# Patient Record
Sex: Female | Born: 1945
Health system: Southern US, Community
[De-identification: ages and names within clinical notes are randomized; demographics above are authoritative.]

## PROBLEM LIST (undated history)

## (undated) DIAGNOSIS — I2510 Atherosclerotic heart disease of native coronary artery without angina pectoris: Secondary | ICD-10-CM

## (undated) DIAGNOSIS — F419 Anxiety disorder, unspecified: Secondary | ICD-10-CM

## (undated) DIAGNOSIS — C801 Malignant (primary) neoplasm, unspecified: Secondary | ICD-10-CM

## (undated) DIAGNOSIS — R161 Splenomegaly, not elsewhere classified: Secondary | ICD-10-CM

## (undated) DIAGNOSIS — L900 Lichen sclerosus et atrophicus: Secondary | ICD-10-CM

## (undated) DIAGNOSIS — R112 Nausea with vomiting, unspecified: Secondary | ICD-10-CM

## (undated) DIAGNOSIS — I495 Sick sinus syndrome: Secondary | ICD-10-CM

## (undated) DIAGNOSIS — K219 Gastro-esophageal reflux disease without esophagitis: Secondary | ICD-10-CM

## (undated) DIAGNOSIS — Z9889 Other specified postprocedural states: Secondary | ICD-10-CM

## (undated) DIAGNOSIS — R0982 Postnasal drip: Secondary | ICD-10-CM

## (undated) DIAGNOSIS — I10 Essential (primary) hypertension: Secondary | ICD-10-CM

## (undated) DIAGNOSIS — R011 Cardiac murmur, unspecified: Secondary | ICD-10-CM

## (undated) DIAGNOSIS — E785 Hyperlipidemia, unspecified: Secondary | ICD-10-CM

## (undated) DIAGNOSIS — Z950 Presence of cardiac pacemaker: Secondary | ICD-10-CM

## (undated) DIAGNOSIS — D649 Anemia, unspecified: Secondary | ICD-10-CM

## (undated) DIAGNOSIS — E039 Hypothyroidism, unspecified: Secondary | ICD-10-CM

## (undated) DIAGNOSIS — T7840XA Allergy, unspecified, initial encounter: Secondary | ICD-10-CM

## (undated) DIAGNOSIS — J45909 Unspecified asthma, uncomplicated: Secondary | ICD-10-CM

## (undated) DIAGNOSIS — L409 Psoriasis, unspecified: Secondary | ICD-10-CM

## (undated) DIAGNOSIS — J189 Pneumonia, unspecified organism: Secondary | ICD-10-CM

## (undated) DIAGNOSIS — Z8719 Personal history of other diseases of the digestive system: Secondary | ICD-10-CM

## (undated) HISTORY — DX: Atherosclerotic heart disease of native coronary artery without angina pectoris: I25.10

## (undated) HISTORY — DX: Gastro-esophageal reflux disease without esophagitis: K21.9

## (undated) HISTORY — PX: FINGER SURGERY: SHX640

## (undated) HISTORY — DX: Essential (primary) hypertension: I10

## (undated) HISTORY — PX: ANAL FISSURE REPAIR: SHX2312

## (undated) HISTORY — DX: Sick sinus syndrome: I49.5

## (undated) HISTORY — PX: CARDIAC CATHETERIZATION: SHX172

## (undated) HISTORY — PX: INSERT / REPLACE / REMOVE PACEMAKER: SUR710

## (undated) HISTORY — PX: NASAL SINUS SURGERY: SHX719

## (undated) HISTORY — DX: Allergy, unspecified, initial encounter: T78.40XA

## (undated) HISTORY — PX: TONSILLECTOMY: SUR1361

## (undated) HISTORY — DX: Cardiac murmur, unspecified: R01.1

## (undated) HISTORY — DX: Hypothyroidism, unspecified: E03.9

## (undated) HISTORY — PX: CORONARY ARTERY BYPASS GRAFT: SHX141

## (undated) HISTORY — PX: ABDOMINAL HYSTERECTOMY: SHX81

---

## 2001-02-20 ENCOUNTER — Other Ambulatory Visit: Admission: RE | Admit: 2001-02-20 | Discharge: 2001-02-20 | Payer: Self-pay | Admitting: Orthopaedic Surgery

## 2001-06-18 ENCOUNTER — Encounter (INDEPENDENT_AMBULATORY_CARE_PROVIDER_SITE_OTHER): Payer: Self-pay | Admitting: Internal Medicine

## 2001-06-18 ENCOUNTER — Ambulatory Visit (HOSPITAL_COMMUNITY): Admission: RE | Admit: 2001-06-18 | Discharge: 2001-06-18 | Payer: Self-pay | Admitting: Internal Medicine

## 2009-08-14 ENCOUNTER — Other Ambulatory Visit: Admission: RE | Admit: 2009-08-14 | Discharge: 2009-08-14 | Payer: Self-pay | Admitting: Orthopaedic Surgery

## 2009-09-18 ENCOUNTER — Other Ambulatory Visit: Admission: RE | Admit: 2009-09-18 | Discharge: 2009-09-18 | Payer: Self-pay | Admitting: Orthopaedic Surgery

## 2010-06-01 ENCOUNTER — Other Ambulatory Visit (HOSPITAL_COMMUNITY)
Admission: RE | Admit: 2010-06-01 | Discharge: 2010-06-01 | Disposition: A | Discharge status: Home / Self Care | Payer: Medicare Other | Source: Ambulatory Visit | Attending: Orthopaedic Surgery | Admitting: Orthopaedic Surgery

## 2010-06-01 ENCOUNTER — Other Ambulatory Visit: Payer: Self-pay | Admitting: Orthopaedic Surgery

## 2010-06-01 DIAGNOSIS — R29898 Other symptoms and signs involving the musculoskeletal system: Secondary | ICD-10-CM | POA: Insufficient documentation

## 2013-01-30 ENCOUNTER — Encounter (HOSPITAL_COMMUNITY): Payer: Self-pay | Admitting: Emergency Medicine

## 2013-01-30 ENCOUNTER — Emergency Department (HOSPITAL_COMMUNITY): Payer: Medicare Other

## 2013-01-30 ENCOUNTER — Emergency Department (HOSPITAL_COMMUNITY)
Admission: EM | Admit: 2013-01-30 | Discharge: 2013-01-30 | Disposition: A | Discharge status: Home / Self Care | Payer: Medicare Other | Attending: Emergency Medicine | Admitting: Emergency Medicine

## 2013-01-30 DIAGNOSIS — R0989 Other specified symptoms and signs involving the circulatory and respiratory systems: Secondary | ICD-10-CM | POA: Insufficient documentation

## 2013-01-30 DIAGNOSIS — J069 Acute upper respiratory infection, unspecified: Secondary | ICD-10-CM

## 2013-01-30 DIAGNOSIS — E079 Disorder of thyroid, unspecified: Secondary | ICD-10-CM | POA: Insufficient documentation

## 2013-01-30 DIAGNOSIS — I10 Essential (primary) hypertension: Secondary | ICD-10-CM | POA: Insufficient documentation

## 2013-01-30 DIAGNOSIS — R5381 Other malaise: Secondary | ICD-10-CM | POA: Insufficient documentation

## 2013-01-30 DIAGNOSIS — Z79899 Other long term (current) drug therapy: Secondary | ICD-10-CM | POA: Insufficient documentation

## 2013-01-30 MED ORDER — IPRATROPIUM BROMIDE 0.02 % IN SOLN
0.5000 mg | Freq: Once | RESPIRATORY_TRACT | Status: AC
  Administered 2013-01-30: 0.5 mg via RESPIRATORY_TRACT
  Filled 2013-01-30: qty 2.5

## 2013-01-30 MED ORDER — ALBUTEROL SULFATE (5 MG/ML) 0.5% IN NEBU
5.0000 mg | INHALATION_SOLUTION | Freq: Once | RESPIRATORY_TRACT | Status: AC
  Administered 2013-01-30: 5 mg via RESPIRATORY_TRACT
  Filled 2013-01-30: qty 1

## 2013-01-30 NOTE — ED Provider Notes (Signed)
CSN: [id number]     Arrival date & time 01/30/13  1034 History  This chart was scribed for Amy Hutching, MD by Smiley Houseman, ED Scribe. The patient was seen in room APA18/APA18. Patient's care was started at 10:56 AM.    Chief Complaint  Patient presents with  . Cough  . Fatigue  . Fever   The history is provided by the patient. No language interpreter was used.   HPI Comments: Amy [name] is a 67 y.o. female who presents to the Emergency Department complaining of constant worsening nonproductive cough with associated chest congestion, fever, generalized weakness, and SOB that started 2 days ago.  Pt states fever was around 102F, ED temperature is 100.16F.  She denies vomiting.  She reports she has taken Tylenol for fever with some relief, but doesn't keep fever down.  She denies any sick contacts at home.  Pt is overall healthy, besides current complaint. Severity is mild to moderate.   Dr. Rayfield Citizen in [place]  Past Medical History  Diagnosis Date  . Hypertension   . Thyroid disease    Past Surgical History  Procedure Laterality Date  . Tonsillectomy    . Anal fissure repair    . Abdominal hysterectomy    . Nasal sinus surgery    . Finger surgery     No family history on file. History  Substance Use Topics  . Smoking status: Never Smoker   . Smokeless tobacco: Not on file  . Alcohol Use: No   OB History   Grav Para Term Preterm Abortions TAB SAB Ect Mult Living                 Review of Systems A complete 10 system review of systems was obtained and all systems are negative except as noted in the HPI and PMH.   Allergies  Sulfa antibiotics  Home Medications   Current Outpatient Rx  Name  Route  Sig  Dispense  Refill  . acetaminophen (TYLENOL) 500 MG tablet   Oral   Take 1,000 mg by mouth every 6 (six) hours as needed.         Marland Kitchen BIOTIN PO   Oral   Take 1 tablet by mouth daily.         Marland Kitchen levothyroxine (SYNTHROID, LEVOTHROID) 50 MCG tablet   Oral  Take 50 mcg by mouth daily before breakfast.         . Omega-3 Fatty Acids (FISH OIL PO)   Oral   Take 1 capsule by mouth daily.         . Pseudoephedrine-APAP-DM (DAYQUIL PO)   Oral   Take 2 capsules by mouth every 4 (four) hours as needed (Cold Symptoms).         . Red Yeast Rice Extract (RED YEAST RICE PO)   Oral   Take 2 capsules by mouth daily.         Marland Kitchen triamterene-hydrochlorothiazide (MAXZIDE-25) 37.5-25 MG per tablet   Oral   Take 1 tablet by mouth daily.          Triage Vitals: BP 159/45  Pulse 76  Temp(Src) 100.7 F (38.2 C) (Oral)  Resp 20  Ht 5' (1.524 m)  Wt 145 lb (65.772 kg)  BMI 28.32 kg/m2  SpO2 96% Physical Exam  Nursing note and vitals reviewed. Constitutional: She is oriented to person, place, and time. She appears well-developed and well-nourished.  HENT:  Head: Normocephalic and atraumatic.  Eyes: Conjunctivae and EOM are  normal. Pupils are equal, round, and reactive to light.  Neck: Normal range of motion. Neck supple.  Cardiovascular: Normal rate, regular rhythm and normal heart sounds.   Pulmonary/Chest: Effort normal and breath sounds normal.  Abdominal: Soft. Bowel sounds are normal.  Musculoskeletal: Normal range of motion.  Neurological: She is alert and oriented to person, place, and time.  Skin: Skin is warm and dry.  Psychiatric: She has a normal mood and affect. Her behavior is normal.    ED Course  Procedures (including critical care time) DIAGNOSTIC STUDIES: Oxygen Saturation is 96% on RA, normal by my interpretation.    COORDINATION OF CARE: 11:00 AM-Will order chest X-ray.  Discussed Flu with pt and family. Patient informed of current plan of treatment and evaluation and agrees with plan.    Dg Chest 2 View  01/30/2013   CLINICAL DATA:  Cough and fever and flu-like symptoms  EXAM: CHEST  2 VIEW  COMPARISON:  None.  FINDINGS: The lungs are mildly hyperinflated. There is no focal infiltrate. The cardiopericardial  silhouette is normal in size. The mediastinum is normal in width. There is no pleural effusion or pneumothorax. The observed portions of the bony thorax appear normal.  IMPRESSION: There is no evidence of pneumonia nor CHF. Mild hyperinflation may reflect underlying COPD or reactive airway disease. 1 cannot exclude acute bronchitis in the appropriate clinical setting.   Electronically Signed   By: David  [place]   On: 01/30/2013 12:24      MDM  No diagnosis found. Chest x-ray negative. History and physical consistent with viral syndrome. Supportive care only.   I personally performed the services described in this documentation, which was scribed in my presence. The recorded information has been reviewed and is accurate.     Amy Hutching, MD 01/30/13 1335

## 2013-01-30 NOTE — ED Notes (Signed)
Pt states that the breathing tx did not change her breathing, update given to pt and family at bedside

## 2013-01-30 NOTE — ED Notes (Signed)
Pt daughter updated on plan of care,

## 2013-01-30 NOTE — ED Notes (Signed)
Dr. Adriana Simas at bedside with pt and family

## 2013-01-30 NOTE — ED Notes (Signed)
Pt c/o generalized weakness, sob,  fever, cough that is productive "at times", that started christmas day, denies any n/v/d

## 2013-01-30 NOTE — ED Notes (Signed)
resp therapy at bedside for breathing tx  

## 2013-12-21 ENCOUNTER — Encounter (HOSPITAL_COMMUNITY)
Admission: RE | Admit: 2013-12-21 | Discharge: 2013-12-21 | Disposition: A | Discharge status: Home / Self Care | Payer: Medicare Other | Source: Ambulatory Visit | Attending: Cardiovascular Disease | Admitting: Cardiovascular Disease

## 2013-12-21 ENCOUNTER — Encounter (HOSPITAL_COMMUNITY): Payer: Self-pay

## 2013-12-21 VITALS — BP 122/54 | HR 54 | Ht 60.0 in | Wt 152.0 lb

## 2013-12-21 DIAGNOSIS — Z5189 Encounter for other specified aftercare: Secondary | ICD-10-CM | POA: Insufficient documentation

## 2013-12-21 DIAGNOSIS — Z9861 Coronary angioplasty status: Secondary | ICD-10-CM | POA: Insufficient documentation

## 2013-12-21 HISTORY — DX: Hyperlipidemia, unspecified: E78.5

## 2013-12-21 HISTORY — DX: Unspecified asthma, uncomplicated: J45.909

## 2013-12-21 NOTE — Progress Notes (Signed)
Patient was referred to CR by Dr. Almira Coaster post Stent placement Z98.61. During orientation advised patient on arrival and appointment times what to wear, what to do before, during and after exercise. Reviewed attendance and class policy. Talked about inclement weather and class consultation policy. Pt is scheduled to start Cardiac Rehab on 12/27/13 at 9:30 am. Pt was advised to come to class 5 minutes before class starts. He was also given instructions on meeting with the dietician and attending the Family Structure classes. Pt is eager to get started. Patient was able to complete 6 minute walk test.

## 2013-12-21 NOTE — Patient Instructions (Signed)
Pt has finished orientation and is scheduled to start CR on 12/27/13 at 9:30 am. Pt has been instructed to arrive to class 15 minutes early for scheduled class. Pt has been instructed to wear comfortable clothing and shoes with rubber soles. Pt has been told to take their medications 1 hour prior to coming to class.  If the patient is not going to attend class, he/she has been instructed to call.

## 2013-12-27 ENCOUNTER — Encounter (HOSPITAL_COMMUNITY)
Admission: RE | Admit: 2013-12-27 | Discharge: 2013-12-27 | Disposition: A | Discharge status: Home / Self Care | Payer: Medicare Other | Source: Ambulatory Visit | Attending: Cardiovascular Disease | Admitting: Cardiovascular Disease

## 2013-12-27 DIAGNOSIS — Z5189 Encounter for other specified aftercare: Secondary | ICD-10-CM | POA: Diagnosis not present

## 2013-12-27 DIAGNOSIS — Z9861 Coronary angioplasty status: Secondary | ICD-10-CM | POA: Diagnosis not present

## 2013-12-29 ENCOUNTER — Encounter (HOSPITAL_COMMUNITY)
Admission: RE | Admit: 2013-12-29 | Discharge: 2013-12-29 | Disposition: A | Discharge status: Home / Self Care | Payer: Medicare Other | Source: Ambulatory Visit | Attending: Cardiovascular Disease | Admitting: Cardiovascular Disease

## 2013-12-29 DIAGNOSIS — Z5189 Encounter for other specified aftercare: Secondary | ICD-10-CM | POA: Diagnosis not present

## 2013-12-31 ENCOUNTER — Encounter (HOSPITAL_COMMUNITY): Payer: Medicare Other

## 2014-01-03 ENCOUNTER — Encounter (HOSPITAL_COMMUNITY)
Admission: RE | Admit: 2014-01-03 | Discharge: 2014-01-03 | Disposition: A | Discharge status: Home / Self Care | Payer: Medicare Other | Source: Ambulatory Visit | Attending: Cardiovascular Disease | Admitting: Cardiovascular Disease

## 2014-01-03 DIAGNOSIS — Z5189 Encounter for other specified aftercare: Secondary | ICD-10-CM | POA: Diagnosis not present

## 2014-01-03 NOTE — Progress Notes (Signed)
Cardiac Rehabilitation Program Outcomes Report   Orientation:  12/21/13  Graduate Date:tbd  Discharge Date:  tbd # of sessions completed: 3  Cardiologist: Dr. Hamilton Capri Family MD:  Dr. Memory Argue Time:  0930  A.  Exercise Program:  Tolerates exercise @ 3.44 METS for 15 minutes  B.  Mental Health:  Good mental attitude and Significant family stress  C.  Education/Instruction/Skills  Accurately checks own pulse.  Rest:  55  Exercise:  96, Knows THR for exercise and Uses Perceived Exertion Scale and/or Dyspnea Scale    D.  Nutrition/Weight Control/Body Composition:  Adherence to prescribed nutrition program: good    E.  Blood Lipids   No results found for: CHOL, HDL, LDLCALC, LDLDIRECT, TRIG, CHOLHDL  F.  Lifestyle Changes:  Making positive lifestyle changes  G.  Symptoms noted with exercise:  Asymptomatic  Report Completed By:  Felicity Coyer, RN   Comments:  First week note

## 2014-01-05 ENCOUNTER — Encounter (HOSPITAL_COMMUNITY)
Admission: RE | Admit: 2014-01-05 | Discharge: 2014-01-05 | Disposition: A | Discharge status: Home / Self Care | Payer: Medicare Other | Source: Ambulatory Visit | Attending: Cardiovascular Disease | Admitting: Cardiovascular Disease

## 2014-01-05 DIAGNOSIS — Z5189 Encounter for other specified aftercare: Secondary | ICD-10-CM | POA: Insufficient documentation

## 2014-01-05 DIAGNOSIS — Z9861 Coronary angioplasty status: Secondary | ICD-10-CM | POA: Diagnosis not present

## 2014-01-06 ENCOUNTER — Encounter (HOSPITAL_COMMUNITY): Payer: Medicare Other

## 2014-01-07 ENCOUNTER — Encounter (HOSPITAL_COMMUNITY)
Admission: RE | Admit: 2014-01-07 | Discharge: 2014-01-07 | Disposition: A | Discharge status: Home / Self Care | Payer: Medicare Other | Source: Ambulatory Visit | Attending: Cardiovascular Disease | Admitting: Cardiovascular Disease

## 2014-01-07 DIAGNOSIS — Z5189 Encounter for other specified aftercare: Secondary | ICD-10-CM | POA: Diagnosis not present

## 2014-01-10 ENCOUNTER — Encounter (HOSPITAL_COMMUNITY)
Admission: RE | Admit: 2014-01-10 | Discharge: 2014-01-10 | Disposition: A | Discharge status: Home / Self Care | Payer: Medicare Other | Source: Ambulatory Visit | Attending: Cardiovascular Disease | Admitting: Cardiovascular Disease

## 2014-01-10 DIAGNOSIS — Z5189 Encounter for other specified aftercare: Secondary | ICD-10-CM | POA: Diagnosis not present

## 2014-01-12 ENCOUNTER — Encounter (HOSPITAL_COMMUNITY)
Admission: RE | Admit: 2014-01-12 | Discharge: 2014-01-12 | Disposition: A | Discharge status: Home / Self Care | Payer: Medicare Other | Source: Ambulatory Visit | Attending: Cardiovascular Disease | Admitting: Cardiovascular Disease

## 2014-01-12 DIAGNOSIS — Z5189 Encounter for other specified aftercare: Secondary | ICD-10-CM | POA: Diagnosis not present

## 2014-01-13 ENCOUNTER — Encounter (HOSPITAL_COMMUNITY): Payer: Medicare Other

## 2014-01-14 ENCOUNTER — Encounter (HOSPITAL_COMMUNITY)
Admission: RE | Admit: 2014-01-14 | Discharge: 2014-01-14 | Disposition: A | Discharge status: Home / Self Care | Payer: Medicare Other | Source: Ambulatory Visit | Attending: Cardiovascular Disease | Admitting: Cardiovascular Disease

## 2014-01-14 DIAGNOSIS — Z5189 Encounter for other specified aftercare: Secondary | ICD-10-CM | POA: Diagnosis not present

## 2014-01-17 ENCOUNTER — Encounter (HOSPITAL_COMMUNITY)
Admission: RE | Admit: 2014-01-17 | Discharge: 2014-01-17 | Disposition: A | Discharge status: Home / Self Care | Payer: Medicare Other | Source: Ambulatory Visit | Attending: Cardiovascular Disease | Admitting: Cardiovascular Disease

## 2014-01-17 DIAGNOSIS — Z5189 Encounter for other specified aftercare: Secondary | ICD-10-CM | POA: Diagnosis not present

## 2014-01-19 ENCOUNTER — Encounter (HOSPITAL_COMMUNITY)
Admission: RE | Admit: 2014-01-19 | Discharge: 2014-01-19 | Disposition: A | Discharge status: Home / Self Care | Payer: Medicare Other | Source: Ambulatory Visit | Attending: Cardiovascular Disease | Admitting: Cardiovascular Disease

## 2014-01-19 DIAGNOSIS — Z5189 Encounter for other specified aftercare: Secondary | ICD-10-CM | POA: Diagnosis not present

## 2014-01-21 ENCOUNTER — Encounter (HOSPITAL_COMMUNITY)
Admission: RE | Admit: 2014-01-21 | Discharge: 2014-01-21 | Disposition: A | Discharge status: Home / Self Care | Payer: Medicare Other | Source: Ambulatory Visit | Attending: Cardiovascular Disease | Admitting: Cardiovascular Disease

## 2014-01-21 DIAGNOSIS — Z5189 Encounter for other specified aftercare: Secondary | ICD-10-CM | POA: Diagnosis not present

## 2014-01-24 ENCOUNTER — Encounter (HOSPITAL_COMMUNITY)
Admission: RE | Admit: 2014-01-24 | Discharge: 2014-01-24 | Disposition: A | Discharge status: Home / Self Care | Payer: Medicare Other | Source: Ambulatory Visit | Attending: Cardiovascular Disease | Admitting: Cardiovascular Disease

## 2014-01-24 DIAGNOSIS — Z5189 Encounter for other specified aftercare: Secondary | ICD-10-CM | POA: Diagnosis not present

## 2014-01-26 ENCOUNTER — Encounter (HOSPITAL_COMMUNITY)
Admission: RE | Admit: 2014-01-26 | Discharge: 2014-01-26 | Disposition: A | Discharge status: Home / Self Care | Payer: Medicare Other | Source: Ambulatory Visit | Attending: Cardiovascular Disease | Admitting: Cardiovascular Disease

## 2014-01-26 DIAGNOSIS — Z5189 Encounter for other specified aftercare: Secondary | ICD-10-CM | POA: Diagnosis not present

## 2014-01-28 ENCOUNTER — Encounter (HOSPITAL_COMMUNITY): Payer: Medicare Other

## 2014-01-31 ENCOUNTER — Encounter (HOSPITAL_COMMUNITY)
Admission: RE | Admit: 2014-01-31 | Discharge: 2014-01-31 | Disposition: A | Discharge status: Home / Self Care | Payer: Medicare Other | Source: Ambulatory Visit | Attending: Cardiovascular Disease | Admitting: Cardiovascular Disease

## 2014-01-31 DIAGNOSIS — Z5189 Encounter for other specified aftercare: Secondary | ICD-10-CM | POA: Diagnosis not present

## 2014-02-02 ENCOUNTER — Encounter (HOSPITAL_COMMUNITY)
Admission: RE | Admit: 2014-02-02 | Discharge: 2014-02-02 | Disposition: A | Discharge status: Home / Self Care | Payer: Medicare Other | Source: Ambulatory Visit | Attending: Cardiovascular Disease | Admitting: Cardiovascular Disease

## 2014-02-02 DIAGNOSIS — Z5189 Encounter for other specified aftercare: Secondary | ICD-10-CM | POA: Diagnosis not present

## 2014-02-04 ENCOUNTER — Encounter (HOSPITAL_COMMUNITY): Payer: Medicare Other

## 2014-02-07 ENCOUNTER — Encounter (HOSPITAL_COMMUNITY)
Admission: RE | Admit: 2014-02-07 | Discharge: 2014-02-07 | Disposition: A | Discharge status: Home / Self Care | Payer: Medicare Other | Source: Ambulatory Visit | Attending: Cardiovascular Disease | Admitting: Cardiovascular Disease

## 2014-02-07 DIAGNOSIS — Z5189 Encounter for other specified aftercare: Secondary | ICD-10-CM | POA: Diagnosis not present

## 2014-02-07 DIAGNOSIS — Z9861 Coronary angioplasty status: Secondary | ICD-10-CM | POA: Diagnosis not present

## 2014-02-09 ENCOUNTER — Encounter (HOSPITAL_COMMUNITY)
Admission: RE | Admit: 2014-02-09 | Discharge: 2014-02-09 | Disposition: A | Discharge status: Home / Self Care | Payer: Medicare Other | Source: Ambulatory Visit | Attending: Cardiovascular Disease | Admitting: Cardiovascular Disease

## 2014-02-09 DIAGNOSIS — Z5189 Encounter for other specified aftercare: Secondary | ICD-10-CM | POA: Diagnosis not present

## 2014-02-11 ENCOUNTER — Encounter (HOSPITAL_COMMUNITY)
Admission: RE | Admit: 2014-02-11 | Discharge: 2014-02-11 | Disposition: A | Discharge status: Home / Self Care | Payer: Medicare Other | Source: Ambulatory Visit | Attending: Cardiovascular Disease | Admitting: Cardiovascular Disease

## 2014-02-11 DIAGNOSIS — Z5189 Encounter for other specified aftercare: Secondary | ICD-10-CM | POA: Diagnosis not present

## 2014-02-11 NOTE — Progress Notes (Signed)
Cardiac Rehabilitation Program Outcomes Report   Orientation:  12/21/13 Graduate Date:  tbd Discharge Date:  tbd # of sessions completed: 18  Cardiologist: Dr. Hamilton Capri Family MD:  Dr. Octavio Graves Class Time:  0930  A.  Exercise Program:  Tolerates exercise @ 3.42 METS for 30 minutes  B.  Mental Health:  Health related anxiety and Significant family stress  C.  Education/Instruction/Skills  Accurately checks own pulse.  Rest:  50  Exercise:  97, Knows THR for exercise and Uses Perceived Exertion Scale and/or Dyspnea Scale    D.  Nutrition/Weight Control/Body Composition:  Adherence to prescribed nutrition program: good    E.  Blood Lipids   No results found for: CHOL, HDL, LDLCALC, LDLDIRECT, TRIG, CHOLHDL  F.  Lifestyle Changes:  Making positive lifestyle changes  G.  Symptoms noted with exercise:  Asymptomatic  Report Completed By:  Felicity Coyer, RN   Comments:  Halfway note.

## 2014-02-14 ENCOUNTER — Encounter (HOSPITAL_COMMUNITY)
Admission: RE | Admit: 2014-02-14 | Discharge: 2014-02-14 | Disposition: A | Discharge status: Home / Self Care | Payer: Medicare Other | Source: Ambulatory Visit | Attending: Cardiovascular Disease | Admitting: Cardiovascular Disease

## 2014-02-14 DIAGNOSIS — Z5189 Encounter for other specified aftercare: Secondary | ICD-10-CM | POA: Diagnosis not present

## 2014-02-16 ENCOUNTER — Encounter (HOSPITAL_COMMUNITY)
Admission: RE | Admit: 2014-02-16 | Discharge: 2014-02-16 | Disposition: A | Discharge status: Home / Self Care | Payer: Medicare Other | Source: Ambulatory Visit | Attending: Cardiovascular Disease | Admitting: Cardiovascular Disease

## 2014-02-16 DIAGNOSIS — Z5189 Encounter for other specified aftercare: Secondary | ICD-10-CM | POA: Diagnosis not present

## 2014-02-18 ENCOUNTER — Encounter (HOSPITAL_COMMUNITY)
Admission: RE | Admit: 2014-02-18 | Discharge: 2014-02-18 | Disposition: A | Discharge status: Home / Self Care | Payer: Medicare Other | Source: Ambulatory Visit | Attending: Cardiovascular Disease | Admitting: Cardiovascular Disease

## 2014-02-18 DIAGNOSIS — Z5189 Encounter for other specified aftercare: Secondary | ICD-10-CM | POA: Diagnosis not present

## 2014-02-21 ENCOUNTER — Encounter (HOSPITAL_COMMUNITY)
Admission: RE | Admit: 2014-02-21 | Discharge: 2014-02-21 | Disposition: A | Discharge status: Home / Self Care | Payer: Medicare Other | Source: Ambulatory Visit | Attending: Cardiovascular Disease | Admitting: Cardiovascular Disease

## 2014-02-21 DIAGNOSIS — Z5189 Encounter for other specified aftercare: Secondary | ICD-10-CM | POA: Diagnosis not present

## 2014-02-23 ENCOUNTER — Encounter (HOSPITAL_COMMUNITY)
Admission: RE | Admit: 2014-02-23 | Discharge: 2014-02-23 | Disposition: A | Discharge status: Home / Self Care | Payer: Medicare Other | Source: Ambulatory Visit | Attending: Cardiovascular Disease | Admitting: Cardiovascular Disease

## 2014-02-23 DIAGNOSIS — Z5189 Encounter for other specified aftercare: Secondary | ICD-10-CM | POA: Diagnosis not present

## 2014-02-25 ENCOUNTER — Encounter (HOSPITAL_COMMUNITY): Payer: Medicare Other

## 2014-02-28 ENCOUNTER — Encounter (HOSPITAL_COMMUNITY)
Admission: RE | Admit: 2014-02-28 | Discharge: 2014-02-28 | Disposition: A | Discharge status: Home / Self Care | Payer: Medicare Other | Source: Ambulatory Visit | Attending: Cardiovascular Disease | Admitting: Cardiovascular Disease

## 2014-02-28 DIAGNOSIS — Z5189 Encounter for other specified aftercare: Secondary | ICD-10-CM | POA: Diagnosis not present

## 2014-03-02 ENCOUNTER — Encounter (HOSPITAL_COMMUNITY)
Admission: RE | Admit: 2014-03-02 | Discharge: 2014-03-02 | Disposition: A | Discharge status: Home / Self Care | Payer: Medicare Other | Source: Ambulatory Visit | Attending: Cardiovascular Disease | Admitting: Cardiovascular Disease

## 2014-03-02 DIAGNOSIS — Z5189 Encounter for other specified aftercare: Secondary | ICD-10-CM | POA: Diagnosis not present

## 2014-03-04 ENCOUNTER — Encounter (HOSPITAL_COMMUNITY)
Admission: RE | Admit: 2014-03-04 | Discharge: 2014-03-04 | Disposition: A | Discharge status: Home / Self Care | Payer: Medicare Other | Source: Ambulatory Visit | Attending: Cardiovascular Disease | Admitting: Cardiovascular Disease

## 2014-03-04 DIAGNOSIS — Z5189 Encounter for other specified aftercare: Secondary | ICD-10-CM | POA: Diagnosis not present

## 2014-03-07 ENCOUNTER — Encounter (HOSPITAL_COMMUNITY)
Admission: RE | Admit: 2014-03-07 | Discharge: 2014-03-07 | Disposition: A | Discharge status: Home / Self Care | Payer: Medicare Other | Source: Ambulatory Visit | Attending: Cardiovascular Disease | Admitting: Cardiovascular Disease

## 2014-03-07 DIAGNOSIS — Z9861 Coronary angioplasty status: Secondary | ICD-10-CM | POA: Insufficient documentation

## 2014-03-07 DIAGNOSIS — Z5189 Encounter for other specified aftercare: Secondary | ICD-10-CM | POA: Insufficient documentation

## 2014-03-09 ENCOUNTER — Encounter (HOSPITAL_COMMUNITY)
Admission: RE | Admit: 2014-03-09 | Discharge: 2014-03-09 | Disposition: A | Discharge status: Home / Self Care | Payer: Medicare Other | Source: Ambulatory Visit | Attending: Cardiovascular Disease | Admitting: Cardiovascular Disease

## 2014-03-09 DIAGNOSIS — Z5189 Encounter for other specified aftercare: Secondary | ICD-10-CM | POA: Diagnosis not present

## 2014-03-11 ENCOUNTER — Encounter (HOSPITAL_COMMUNITY)
Admission: RE | Admit: 2014-03-11 | Discharge: 2014-03-11 | Disposition: A | Discharge status: Home / Self Care | Payer: Medicare Other | Source: Ambulatory Visit | Attending: Cardiovascular Disease | Admitting: Cardiovascular Disease

## 2014-03-11 DIAGNOSIS — Z5189 Encounter for other specified aftercare: Secondary | ICD-10-CM | POA: Diagnosis not present

## 2014-03-14 ENCOUNTER — Encounter (HOSPITAL_COMMUNITY)
Admission: RE | Admit: 2014-03-14 | Discharge: 2014-03-14 | Disposition: A | Discharge status: Home / Self Care | Payer: Medicare Other | Source: Ambulatory Visit | Attending: Cardiovascular Disease | Admitting: Cardiovascular Disease

## 2014-03-14 DIAGNOSIS — Z5189 Encounter for other specified aftercare: Secondary | ICD-10-CM | POA: Diagnosis not present

## 2014-03-16 ENCOUNTER — Encounter (HOSPITAL_COMMUNITY)
Admission: RE | Admit: 2014-03-16 | Discharge: 2014-03-16 | Disposition: A | Discharge status: Home / Self Care | Payer: Medicare Other | Source: Ambulatory Visit | Attending: Cardiovascular Disease | Admitting: Cardiovascular Disease

## 2014-03-16 DIAGNOSIS — Z5189 Encounter for other specified aftercare: Secondary | ICD-10-CM | POA: Diagnosis not present

## 2014-03-18 ENCOUNTER — Encounter (HOSPITAL_COMMUNITY)
Admission: RE | Admit: 2014-03-18 | Discharge: 2014-03-18 | Disposition: A | Discharge status: Home / Self Care | Payer: Medicare Other | Source: Ambulatory Visit | Attending: Cardiovascular Disease | Admitting: Cardiovascular Disease

## 2014-03-18 DIAGNOSIS — Z5189 Encounter for other specified aftercare: Secondary | ICD-10-CM | POA: Diagnosis not present

## 2014-03-21 ENCOUNTER — Encounter (HOSPITAL_COMMUNITY): Payer: Medicare Other

## 2014-03-23 ENCOUNTER — Encounter (HOSPITAL_COMMUNITY)
Admission: RE | Admit: 2014-03-23 | Discharge: 2014-03-23 | Disposition: A | Discharge status: Home / Self Care | Payer: Medicare Other | Source: Ambulatory Visit | Attending: Cardiovascular Disease | Admitting: Cardiovascular Disease

## 2014-03-23 DIAGNOSIS — Z5189 Encounter for other specified aftercare: Secondary | ICD-10-CM | POA: Diagnosis not present

## 2014-03-25 ENCOUNTER — Encounter (HOSPITAL_COMMUNITY)
Admission: RE | Admit: 2014-03-25 | Discharge: 2014-03-25 | Disposition: A | Discharge status: Home / Self Care | Payer: Medicare Other | Source: Ambulatory Visit | Attending: Cardiovascular Disease | Admitting: Cardiovascular Disease

## 2014-03-25 DIAGNOSIS — Z5189 Encounter for other specified aftercare: Secondary | ICD-10-CM | POA: Diagnosis not present

## 2014-03-28 ENCOUNTER — Encounter (HOSPITAL_COMMUNITY)
Admission: RE | Admit: 2014-03-28 | Discharge: 2014-03-28 | Disposition: A | Discharge status: Home / Self Care | Payer: Medicare Other | Source: Ambulatory Visit | Attending: Cardiovascular Disease | Admitting: Cardiovascular Disease

## 2014-03-28 DIAGNOSIS — Z5189 Encounter for other specified aftercare: Secondary | ICD-10-CM | POA: Diagnosis not present

## 2014-03-30 ENCOUNTER — Encounter (HOSPITAL_COMMUNITY): Payer: Medicare Other

## 2014-04-01 ENCOUNTER — Encounter (HOSPITAL_COMMUNITY)
Admission: RE | Admit: 2014-04-01 | Discharge: 2014-04-01 | Disposition: A | Discharge status: Home / Self Care | Payer: Medicare Other | Source: Ambulatory Visit | Attending: Cardiovascular Disease | Admitting: Cardiovascular Disease

## 2014-04-01 DIAGNOSIS — Z5189 Encounter for other specified aftercare: Secondary | ICD-10-CM | POA: Diagnosis not present

## 2014-04-01 NOTE — Progress Notes (Signed)
Cardiac Rehabilitation Program Outcomes Report   Orientation:  12/21/13 Graduate Date:  04/01/14 Discharge Date:  04/01/14 # of sessions completed: 36  Cardiologist: Dr. Hamilton Capri Family MD:  Dr. Memory Argue Time:  0930  A.  Exercise Program:  Tolerates exercise @ 3.38 METS for 30 minutes, Improved functional capacity  38.85 %, Improved  muscular strength  1.45 % and Discharged  B.  Mental Health:  Significant family stress and Quality of Life (QOL)  changes:  Overall  -0.15 %, Health/Functioning 38.85 %, Socioeconomics -19.92 %, Psych/Spiritual -10.14 %, Family -50 %    C.  Education/Instruction/Skills  Accurately checks own pulse.  Rest:  62  Exercise:  75, Knows THR for exercise, Uses Perceived Exertion Scale and/or Dyspnea Scale and Attended 100% education classes    D.  Nutrition/Weight Control/Body Composition:  Adherence to prescribed nutrition program: good    E.  Blood Lipids   No results found for: CHOL, HDL, LDLCALC, LDLDIRECT, TRIG, CHOLHDL  F.  Lifestyle Changes:  Making positive lifestyle changes  G.  Symptoms noted with exercise:  Asymptomatic  Report Completed By:  Felicity Coyer, RN   Comments:  Graduation note.  Pt had decreases in quality of life scores.  Offered emotional support and referral to counselor/psychiatrist, pt refused.  States she does feel better than she did when she started.  CR will make 1 month, 6 month, and 1 year call backs.

## 2014-04-01 NOTE — Progress Notes (Signed)
Patient is discharged from [facility] and Pulmonary program today, April 01, 2014 with 36 sessions.  She achieved LTG of 30 minutes of aerobic exercise at max met level of 3.38.  All patient vitals are WNL.  Patient has met with dietician.  Discharge instructions have been reviewed in detail and patient expressed an understanding of material given.  Patient plans to exercise at home and possibly join [facility]. Cardiac Rehab will make 1 month, 6 month and 1 year call backs.  Patient had no complaints of any abnormal S/S or pain on their exit visit.

## 2014-04-04 ENCOUNTER — Encounter (HOSPITAL_COMMUNITY): Payer: Medicare Other

## 2014-10-12 ENCOUNTER — Encounter (HOSPITAL_COMMUNITY): Payer: Self-pay

## 2014-10-12 ENCOUNTER — Encounter (HOSPITAL_COMMUNITY)
Admission: RE | Admit: 2014-10-12 | Discharge: 2014-10-12 | Disposition: A | Discharge status: Home / Self Care | Payer: Medicare Other | Source: Ambulatory Visit | Attending: Ophthalmology | Admitting: Ophthalmology

## 2014-10-12 DIAGNOSIS — Z01818 Encounter for other preprocedural examination: Secondary | ICD-10-CM | POA: Insufficient documentation

## 2014-10-12 DIAGNOSIS — H2511 Age-related nuclear cataract, right eye: Secondary | ICD-10-CM | POA: Diagnosis not present

## 2014-10-12 LAB — BASIC METABOLIC PANEL
Anion gap: 7 (ref 5–15)
BUN: 22 mg/dL — AB (ref 6–20)
CALCIUM: 8.9 mg/dL (ref 8.9–10.3)
CO2: 26 mmol/L (ref 22–32)
CREATININE: 0.85 mg/dL (ref 0.44–1.00)
Chloride: 99 mmol/L — ABNORMAL LOW (ref 101–111)
GFR calc Af Amer: >60 mL/min (ref >60)
GLUCOSE: 93 mg/dL (ref 65–99)
Potassium: 3.9 mmol/L (ref 3.5–5.1)
SODIUM: 132 mmol/L — AB (ref 135–145)

## 2014-10-12 LAB — CBC
HCT: 35.9 % — ABNORMAL LOW (ref 36.0–46.0)
Hemoglobin: 12.5 g/dL (ref 12.0–15.0)
MCH: 29.6 pg (ref 26.0–34.0)
MCHC: 34.8 g/dL (ref 30.0–36.0)
MCV: 85.1 fL (ref 78.0–100.0)
PLATELETS: 165 10*3/uL (ref 150–400)
RBC: 4.22 MIL/uL (ref 3.87–5.11)
RDW: 13.2 % (ref 11.5–15.5)
WBC: 5.3 10*3/uL (ref 4.0–10.5)

## 2014-10-12 NOTE — Patient Instructions (Signed)
IMAGENE BOSS  10/12/2014     @PREFPERIOPPHARMACY @   Your procedure is scheduled on 10/17/2014.  Report to [facility] at 10:30 A.M.  Call this number if you have problems the morning of surgery:  [phone]   Remember:  Do not eat food or drink liquids after midnight.  Take these medicines the morning of surgery with A SIP OF WATER Synthroid, Lisinopril, Singulair   Do not wear jewelry, make-up or nail polish.  Do not wear lotions, powders, or perfumes.  You may wear deodorant.  Do not shave 48 hours prior to surgery.  Men may shave face and neck.  Do not bring valuables to the hospital.  [facility] is not responsible for any belongings or valuables.  Contacts, dentures or bridgework may not be worn into surgery.  Leave your suitcase in the car.  After surgery it may be brought to your room.  For patients admitted to the hospital, discharge time will be determined by your treatment team.  Patients discharged the day of surgery will not be allowed to drive home.    Please read over the following fact sheets that you were given. Anesthesia Post-op Instructions     PATIENT INSTRUCTIONS POST-ANESTHESIA  IMMEDIATELY FOLLOWING SURGERY:  Do not drive or operate machinery for the first twenty four hours after surgery.  Do not make any important decisions for twenty four hours after surgery or while taking narcotic pain medications or sedatives.  If you develop intractable nausea and vomiting or a severe headache please notify your doctor immediately.  FOLLOW-UP:  Please make an appointment with your surgeon as instructed. You do not need to follow up with anesthesia unless specifically instructed to do so.  WOUND CARE INSTRUCTIONS (if applicable):  Keep a dry clean dressing on the anesthesia/puncture wound site if there is drainage.  Once the wound has quit draining you may leave it open to air.  Generally you should leave the bandage intact for twenty four hours unless there is  drainage.  If the epidural site drains for more than 36-48 hours please call the anesthesia department.  QUESTIONS?:  Please feel free to call your physician or the hospital operator if you have any questions, and they will be happy to assist you.      Cataract Surgery  A cataract is a clouding of the lens of the eye. When a lens becomes cloudy, vision is reduced based on the degree and nature of the clouding. Surgery may be needed to improve vision. Surgery removes the cloudy lens and usually replaces it with a substitute lens (intraocular lens, IOL). LET YOUR EYE DOCTOR KNOW ABOUT:  Allergies to food or medicine.  Medicines taken including herbs, eye drops, over-the-counter medicines, and creams.  Use of steroids (by mouth or creams).  Previous problems with anesthetics or numbing medicine.  History of bleeding problems or blood clots.  Previous surgery.  Other health problems, including diabetes and kidney problems.  Possibility of pregnancy, if this applies. RISKS AND COMPLICATIONS  Infection.  Inflammation of the eyeball (endophthalmitis) that can spread to both eyes (sympathetic ophthalmia).  Poor wound healing.  If an IOL is inserted, it can later fall out of proper position. This is very uncommon.  Clouding of the part of your eye that holds an IOL in place. This is called an "after-cataract." These are uncommon but easily treated. BEFORE THE PROCEDURE  Do not eat or drink anything except small amounts of water for 8 to 12  before your surgery, or as directed by your caregiver.  Unless you are told otherwise, continue any eye drops you have been prescribed.  Talk to your primary caregiver about all other medicines that you take (both prescription and nonprescription). In some cases, you may need to stop or change medicines near the time of your surgery. This is most important if you are taking blood-thinning medicine.Do not stop medicines unless you are told to do  so.  Arrange for someone to drive you to and from the procedure.  Do not put contact lenses in either eye on the day of your surgery. PROCEDURE There is more than one method for safely removing a cataract. Your doctor can explain the differences and help determine which is best for you. Phacoemulsification surgery is the most common form of cataract surgery.  An injection is given behind the eye or eye drops are given to make this a painless procedure.  A small cut (incision) is made on the edge of the clear, dome-shaped surface that covers the front of the eye (cornea).  A tiny probe is painlessly inserted into the eye. This device gives off ultrasound waves that soften and break up the cloudy center of the lens. This makes it easier for the cloudy lens to be removed by suction.  An IOL may be implanted.  The normal lens of the eye is covered by a clear capsule. Part of that capsule is intentionally left in the eye to support the IOL.  Your surgeon may or may not use stitches to close the incision. There are other forms of cataract surgery that require a larger incision and stitches to close the eye. This approach is taken in cases where the doctor feels that the cataract cannot be easily removed using phacoemulsification. AFTER THE PROCEDURE  When an IOL is implanted, it does not need care. It becomes a permanent part of your eye and cannot be seen or felt.  Your doctor will schedule follow-up exams to check on your progress.  Review your other medicines with your doctor to see which can be resumed after surgery.  Use eye drops or take medicine as prescribed by your doctor. Document Released: 01/10/2011 Document Revised: 06/07/2013 Document Reviewed: 01/10/2011 [employer] Patient Information 2015 [place], [place]. This information is not intended to replace advice given to you by your health care provider. Make sure you discuss any questions you have with your health care provider.

## 2014-10-14 MED ORDER — LIDOCAINE HCL 3.5 % OP GEL
OPHTHALMIC | Status: AC
  Filled 2014-10-14: qty 1

## 2014-10-14 MED ORDER — NEOMYCIN-POLYMYXIN-DEXAMETH 3.5-10000-0.1 OP SUSP
OPHTHALMIC | Status: AC
  Filled 2014-10-14: qty 5

## 2014-10-14 MED ORDER — CYCLOPENTOLATE-PHENYLEPHRINE OP SOLN OPTIME - NO CHARGE
OPHTHALMIC | Status: AC
  Filled 2014-10-14: qty 2

## 2014-10-14 MED ORDER — PHENYLEPHRINE HCL 2.5 % OP SOLN
OPHTHALMIC | Status: AC
Start: 2014-10-14 — End: 2014-10-14
  Filled 2014-10-14: qty 15

## 2014-10-14 MED ORDER — TETRACAINE HCL 0.5 % OP SOLN
OPHTHALMIC | Status: AC
  Filled 2014-10-14: qty 2

## 2014-10-14 MED ORDER — LIDOCAINE HCL (PF) 1 % IJ SOLN
INTRAMUSCULAR | Status: AC
  Filled 2014-10-14: qty 2

## 2014-10-17 ENCOUNTER — Ambulatory Visit (HOSPITAL_COMMUNITY)
Admission: RE | Admit: 2014-10-17 | Discharge: 2014-10-17 | Disposition: A | Discharge status: Home / Self Care | Payer: Medicare Other | Source: Ambulatory Visit | Attending: Ophthalmology | Admitting: Ophthalmology

## 2014-10-17 ENCOUNTER — Ambulatory Visit (HOSPITAL_COMMUNITY): Payer: Medicare Other | Admitting: Anesthesiology

## 2014-10-17 ENCOUNTER — Encounter (HOSPITAL_COMMUNITY)
Admission: RE | Disposition: A | Discharge status: Home / Self Care | Payer: Self-pay | Source: Ambulatory Visit | Attending: Ophthalmology

## 2014-10-17 ENCOUNTER — Encounter (HOSPITAL_COMMUNITY): Payer: Self-pay | Admitting: Anesthesiology

## 2014-10-17 DIAGNOSIS — I10 Essential (primary) hypertension: Secondary | ICD-10-CM | POA: Diagnosis not present

## 2014-10-17 DIAGNOSIS — Z79899 Other long term (current) drug therapy: Secondary | ICD-10-CM | POA: Insufficient documentation

## 2014-10-17 DIAGNOSIS — Z7982 Long term (current) use of aspirin: Secondary | ICD-10-CM | POA: Insufficient documentation

## 2014-10-17 DIAGNOSIS — H2511 Age-related nuclear cataract, right eye: Secondary | ICD-10-CM | POA: Diagnosis present

## 2014-10-17 DIAGNOSIS — E780 Pure hypercholesterolemia: Secondary | ICD-10-CM | POA: Insufficient documentation

## 2014-10-17 DIAGNOSIS — E039 Hypothyroidism, unspecified: Secondary | ICD-10-CM | POA: Diagnosis not present

## 2014-10-17 DIAGNOSIS — I2510 Atherosclerotic heart disease of native coronary artery without angina pectoris: Secondary | ICD-10-CM | POA: Diagnosis not present

## 2014-10-17 DIAGNOSIS — Z955 Presence of coronary angioplasty implant and graft: Secondary | ICD-10-CM | POA: Diagnosis not present

## 2014-10-17 HISTORY — PX: CATARACT EXTRACTION W/PHACO: SHX586

## 2014-10-17 SURGERY — PHACOEMULSIFICATION, CATARACT, WITH IOL INSERTION
Anesthesia: Monitor Anesthesia Care | Site: Eye | Laterality: Right

## 2014-10-17 MED ORDER — PHENYLEPHRINE HCL 2.5 % OP SOLN
1.0000 [drp] | OPHTHALMIC | Status: AC
Start: 2014-10-17 — End: 2014-10-17
  Administered 2014-10-17 (×3): 1 [drp] via OPHTHALMIC

## 2014-10-17 MED ORDER — MIDAZOLAM HCL 2 MG/2ML IJ SOLN
INTRAMUSCULAR | Status: AC
  Filled 2014-10-17: qty 2

## 2014-10-17 MED ORDER — LACTATED RINGERS IV SOLN
INTRAVENOUS | Status: DC
  Administered 2014-10-17: 12:00:00 via INTRAVENOUS

## 2014-10-17 MED ORDER — CYCLOPENTOLATE-PHENYLEPHRINE 0.2-1 % OP SOLN
1.0000 [drp] | OPHTHALMIC | Status: AC
  Administered 2014-10-17 (×3): 1 [drp] via OPHTHALMIC

## 2014-10-17 MED ORDER — TETRACAINE HCL 0.5 % OP SOLN
1.0000 [drp] | OPHTHALMIC | Status: AC
Start: 2014-10-17 — End: 2014-10-17
  Administered 2014-10-17 (×3): 1 [drp] via OPHTHALMIC

## 2014-10-17 MED ORDER — PROVISC 10 MG/ML IO SOLN
INTRAOCULAR | Status: DC | PRN
  Administered 2014-10-17: 0.85 mL via INTRAOCULAR

## 2014-10-17 MED ORDER — EPINEPHRINE HCL 1 MG/ML IJ SOLN
INTRAOCULAR | Status: DC | PRN
  Administered 2014-10-17: 500 mL

## 2014-10-17 MED ORDER — FENTANYL CITRATE (PF) 100 MCG/2ML IJ SOLN
INTRAMUSCULAR | Status: AC
  Filled 2014-10-17: qty 2

## 2014-10-17 MED ORDER — LIDOCAINE HCL (PF) 1 % IJ SOLN
INTRAMUSCULAR | Status: DC | PRN
  Administered 2014-10-17: .8 mL

## 2014-10-17 MED ORDER — LIDOCAINE HCL 3.5 % OP GEL
1.0000 "application " | Freq: Once | OPHTHALMIC | Status: AC
  Administered 2014-10-17: 1 via OPHTHALMIC

## 2014-10-17 MED ORDER — EPINEPHRINE HCL 1 MG/ML IJ SOLN
INTRAMUSCULAR | Status: AC
  Filled 2014-10-17: qty 1

## 2014-10-17 MED ORDER — MIDAZOLAM HCL 2 MG/2ML IJ SOLN
1.0000 mg | INTRAMUSCULAR | Status: DC | PRN
  Administered 2014-10-17: 2 mg via INTRAVENOUS

## 2014-10-17 MED ORDER — NEOMYCIN-POLYMYXIN-DEXAMETH 3.5-10000-0.1 OP SUSP
OPHTHALMIC | Status: DC | PRN
  Administered 2014-10-17: 2 [drp]

## 2014-10-17 MED ORDER — BSS IO SOLN
INTRAOCULAR | Status: DC | PRN
  Administered 2014-10-17: 30 mL via INTRAOCULAR

## 2014-10-17 MED ORDER — POVIDONE-IODINE 5 % OP SOLN
OPHTHALMIC | Status: DC | PRN
  Administered 2014-10-17: 1 via OPHTHALMIC

## 2014-10-17 MED ORDER — FENTANYL CITRATE (PF) 100 MCG/2ML IJ SOLN
25.0000 ug | INTRAMUSCULAR | Status: AC
  Administered 2014-10-17 (×2): 25 ug via INTRAVENOUS

## 2014-10-17 SURGICAL SUPPLY — 34 items
CAPSULAR TENSION RING-AMO (OPHTHALMIC RELATED) IMPLANT
CLOTH BEACON ORANGE TIMEOUT ST (SAFETY) ×2 IMPLANT
EYE SHIELD UNIVERSAL CLEAR (GAUZE/BANDAGES/DRESSINGS) ×2 IMPLANT
GLOVE BIO SURGEON STRL SZ 6.5 (GLOVE) IMPLANT
GLOVE BIO SURGEONS STRL SZ 6.5 (GLOVE)
GLOVE BIOGEL PI IND STRL 6.5 (GLOVE) IMPLANT
GLOVE BIOGEL PI IND STRL 7.0 (GLOVE) IMPLANT
GLOVE BIOGEL PI IND STRL 7.5 (GLOVE) IMPLANT
GLOVE BIOGEL PI INDICATOR 6.5 (GLOVE) ×2
GLOVE BIOGEL PI INDICATOR 7.0 (GLOVE)
GLOVE BIOGEL PI INDICATOR 7.5 (GLOVE)
GLOVE ECLIPSE 6.5 STRL STRAW (GLOVE) IMPLANT
GLOVE ECLIPSE 7.0 STRL STRAW (GLOVE) IMPLANT
GLOVE ECLIPSE 7.5 STRL STRAW (GLOVE) IMPLANT
GLOVE EXAM NITRILE LRG STRL (GLOVE) IMPLANT
GLOVE EXAM NITRILE MD LF STRL (GLOVE) ×2 IMPLANT
GLOVE SKINSENSE NS SZ6.5 (GLOVE)
GLOVE SKINSENSE NS SZ7.0 (GLOVE)
GLOVE SKINSENSE STRL SZ6.5 (GLOVE) IMPLANT
GLOVE SKINSENSE STRL SZ7.0 (GLOVE) IMPLANT
KIT VITRECTOMY (OPHTHALMIC RELATED) IMPLANT
PAD ARMBOARD 7.5X6 YLW CONV (MISCELLANEOUS) ×2 IMPLANT
PROC W NO LENS (INTRAOCULAR LENS)
PROC W SPEC LENS (INTRAOCULAR LENS)
PROCESS W NO LENS (INTRAOCULAR LENS) IMPLANT
PROCESS W SPEC LENS (INTRAOCULAR LENS) IMPLANT
RETRACTOR IRIS SIGHTPATH (OPHTHALMIC RELATED) IMPLANT
RING MALYGIN (MISCELLANEOUS) IMPLANT
SIGHTPATH CAT PROC W REG LENS (Ophthalmic Related) ×3 IMPLANT
SYRINGE LUER LOK 1CC (MISCELLANEOUS) ×2 IMPLANT
TAPE SURG TRANSPORE 1 IN (GAUZE/BANDAGES/DRESSINGS) IMPLANT
TAPE SURGICAL TRANSPORE 1 IN (GAUZE/BANDAGES/DRESSINGS) ×2
VISCOELASTIC ADDITIONAL (OPHTHALMIC RELATED) IMPLANT
WATER STERILE IRR 250ML POUR (IV SOLUTION) ×2 IMPLANT

## 2014-10-17 NOTE — Anesthesia Postprocedure Evaluation (Signed)
  Anesthesia Post-op Note  Patient: Amy [name]  Procedure(s) Performed: Procedure(s) with comments: CATARACT EXTRACTION PHACO AND INTRAOCULAR LENS PLACEMENT (IOC) (Right) - CDE: 4.88  Patient Location: Short Stay  Anesthesia Type:MAC  Level of Consciousness: awake, alert  and oriented  Airway and Oxygen Therapy: Patient Spontanous Breathing  Post-op Pain: none  Post-op Assessment: Post-op Vital signs reviewed, Patient's Cardiovascular Status Stable, Respiratory Function Stable, Patent Airway and No signs of Nausea or vomiting              Post-op Vital Signs: Reviewed and stable  Last Vitals:  Filed Vitals:   10/17/14 1150  BP: 161/51  Pulse:   Temp:   Resp: 17    Complications: No apparent anesthesia complications

## 2014-10-17 NOTE — Transfer of Care (Signed)
Immediate Anesthesia Transfer of Care Note  Patient: Amy [name]  Procedure(s) Performed: Procedure(s) with comments: CATARACT EXTRACTION PHACO AND INTRAOCULAR LENS PLACEMENT (IOC) (Right) - CDE: 4.88  Patient Location: Short Stay  Anesthesia Type:MAC  Level of Consciousness: awake, alert  and oriented  Airway & Oxygen Therapy: Patient Spontanous Breathing  Post-op Assessment: Report given to RN  Post vital signs: Reviewed and stable  Last Vitals:  Filed Vitals:   10/17/14 1150  BP: 161/51  Pulse:   Temp:   Resp: 17    Complications: No apparent anesthesia complications

## 2014-10-17 NOTE — Anesthesia Preprocedure Evaluation (Signed)
Anesthesia Evaluation  Patient identified by MRN, date of birth, ID band Patient awake    Reviewed: Allergy & Precautions, NPO status , Patient's Chart, lab work & pertinent test results  Airway Mallampati: II  TM Distance: >3 FB     Dental  (+) Edentulous Upper, Partial Lower   Pulmonary asthma ,    breath sounds clear to auscultation       Cardiovascular hypertension, Pt. on medications + CAD and + Cardiac Stents   Rhythm:Regular Rate:Bradycardia     Neuro/Psych    GI/Hepatic negative GI ROS,   Endo/Other  Hypothyroidism   Renal/GU      Musculoskeletal   Abdominal   Peds  Hematology   Anesthesia Other Findings   Reproductive/Obstetrics                             Anesthesia Physical Anesthesia Plan  ASA: III  Anesthesia Plan: MAC   Post-op Pain Management:    Induction: Intravenous  Airway Management Planned: Nasal Cannula  Additional Equipment:   Intra-op Plan:   Post-operative Plan:   Informed Consent: I have reviewed the patients History and Physical, chart, labs and discussed the procedure including the risks, benefits and alternatives for the proposed anesthesia with the patient or authorized representative who has indicated his/her understanding and acceptance.     Plan Discussed with:   Anesthesia Plan Comments:         Anesthesia Quick Evaluation

## 2014-10-17 NOTE — Op Note (Signed)
Date of Admission: 10/17/2014  Date of Surgery: 10/17/2014   Pre-Op Dx: Cataract Right Eye  Post-Op Dx: Senile Nuclear Cataract Right  Eye,  Dx Code H25.11  Surgeon: Tonny Branch, M.D.  Assistants: None  Anesthesia: Topical with MAC  Indications: Painless, progressive loss of vision with compromise of daily activities.  Surgery: Cataract Extraction with Intraocular lens Implant Right Eye  Discription: The patient had dilating drops and viscous lidocaine placed into the Right eye in the pre-op holding area. After transfer to the operating room, a time out was performed. The patient was then prepped and draped. Beginning with a 57 degree blade a paracentesis port was made at the surgeon's 2 o'clock position. The anterior chamber was then filled with 1% non-preserved lidocaine. This was followed by filling the anterior chamber with Provisc.  A 2.25mm keratome blade was used to make a clear corneal incision at the temporal limbus.  A bent cystatome needle was used to create a continuous tear capsulotomy. Hydrodissection was performed with balanced salt solution on a Fine canula. The lens nucleus was then removed using the phacoemulsification handpiece. Residual cortex was removed with the I&A handpiece. The anterior chamber and capsular bag were refilled with Provisc. A posterior chamber intraocular lens was placed into the capsular bag with it's injector. The implant was positioned with the Kuglan hook. The Provisc was then removed from the anterior chamber and capsular bag with the I&A handpiece. Stromal hydration of the main incision and paracentesis port was performed with BSS on a Fine canula. The wounds were tested for leak which was negative. The patient tolerated the procedure well. There were no operative complications. The patient was then transferred to the recovery room in stable condition.  Complications: None  Specimen: None  EBL: None  Prosthetic device: Hoya iSert 250, power 21.0 D,  SN [id number].

## 2014-10-17 NOTE — Discharge Instructions (Signed)

## 2014-10-17 NOTE — H&P (Signed)
I have reviewed the H&P, the patient was re-examined, and I have identified no interval changes in medical condition and plan of care since the history and physical of record  

## 2014-10-18 ENCOUNTER — Encounter (HOSPITAL_COMMUNITY): Payer: Self-pay | Admitting: Ophthalmology

## 2014-10-25 ENCOUNTER — Encounter (HOSPITAL_COMMUNITY): Payer: Self-pay

## 2014-10-25 ENCOUNTER — Encounter (HOSPITAL_COMMUNITY)
Admission: RE | Admit: 2014-10-25 | Discharge: 2014-10-25 | Disposition: A | Discharge status: Home / Self Care | Payer: Medicare Other | Source: Ambulatory Visit | Attending: Ophthalmology | Admitting: Ophthalmology

## 2014-10-26 MED ORDER — TETRACAINE HCL 0.5 % OP SOLN
OPHTHALMIC | Status: AC
  Filled 2014-10-26: qty 2

## 2014-10-26 MED ORDER — LIDOCAINE HCL (PF) 1 % IJ SOLN
INTRAMUSCULAR | Status: AC
  Filled 2014-10-26: qty 2

## 2014-10-26 MED ORDER — CYCLOPENTOLATE-PHENYLEPHRINE OP SOLN OPTIME - NO CHARGE
OPHTHALMIC | Status: AC
  Filled 2014-10-26: qty 2

## 2014-10-26 MED ORDER — LIDOCAINE HCL 3.5 % OP GEL
OPHTHALMIC | Status: AC
  Filled 2014-10-26: qty 1

## 2014-10-26 MED ORDER — NEOMYCIN-POLYMYXIN-DEXAMETH 3.5-10000-0.1 OP SUSP
OPHTHALMIC | Status: AC
  Filled 2014-10-26: qty 5

## 2014-10-26 MED ORDER — PHENYLEPHRINE HCL 2.5 % OP SOLN
OPHTHALMIC | Status: AC
  Filled 2014-10-26: qty 15

## 2014-10-27 ENCOUNTER — Ambulatory Visit (HOSPITAL_COMMUNITY)
Admission: RE | Admit: 2014-10-27 | Discharge: 2014-10-27 | Disposition: A | Discharge status: Home / Self Care | Payer: Medicare Other | Source: Ambulatory Visit | Attending: Ophthalmology | Admitting: Ophthalmology

## 2014-10-27 ENCOUNTER — Ambulatory Visit (HOSPITAL_COMMUNITY): Payer: Medicare Other | Admitting: Anesthesiology

## 2014-10-27 ENCOUNTER — Encounter (HOSPITAL_COMMUNITY): Payer: Self-pay | Admitting: *Deleted

## 2014-10-27 ENCOUNTER — Encounter (HOSPITAL_COMMUNITY)
Admission: RE | Disposition: A | Discharge status: Home / Self Care | Payer: Self-pay | Source: Ambulatory Visit | Attending: Ophthalmology

## 2014-10-27 DIAGNOSIS — I10 Essential (primary) hypertension: Secondary | ICD-10-CM | POA: Insufficient documentation

## 2014-10-27 DIAGNOSIS — H2512 Age-related nuclear cataract, left eye: Secondary | ICD-10-CM | POA: Insufficient documentation

## 2014-10-27 DIAGNOSIS — Z7982 Long term (current) use of aspirin: Secondary | ICD-10-CM | POA: Diagnosis not present

## 2014-10-27 DIAGNOSIS — Z79899 Other long term (current) drug therapy: Secondary | ICD-10-CM | POA: Diagnosis not present

## 2014-10-27 DIAGNOSIS — E039 Hypothyroidism, unspecified: Secondary | ICD-10-CM | POA: Insufficient documentation

## 2014-10-27 HISTORY — PX: CATARACT EXTRACTION W/PHACO: SHX586

## 2014-10-27 SURGERY — PHACOEMULSIFICATION, CATARACT, WITH IOL INSERTION
Anesthesia: Monitor Anesthesia Care | Site: Eye | Laterality: Left

## 2014-10-27 MED ORDER — NEOMYCIN-POLYMYXIN-DEXAMETH 3.5-10000-0.1 OP SUSP
OPHTHALMIC | Status: DC | PRN
  Administered 2014-10-27: 1 [drp] via OPHTHALMIC

## 2014-10-27 MED ORDER — EPINEPHRINE HCL 1 MG/ML IJ SOLN
INTRAMUSCULAR | Status: AC
  Filled 2014-10-27: qty 1

## 2014-10-27 MED ORDER — MIDAZOLAM HCL 2 MG/2ML IJ SOLN
INTRAMUSCULAR | Status: AC
  Filled 2014-10-27: qty 2

## 2014-10-27 MED ORDER — PHENYLEPHRINE HCL 2.5 % OP SOLN
1.0000 [drp] | OPHTHALMIC | Status: AC
  Administered 2014-10-27 (×3): 1 [drp] via OPHTHALMIC

## 2014-10-27 MED ORDER — EPINEPHRINE HCL 1 MG/ML IJ SOLN
INTRAOCULAR | Status: DC | PRN
  Administered 2014-10-27: 500 mL

## 2014-10-27 MED ORDER — LACTATED RINGERS IV SOLN
INTRAVENOUS | Status: DC
  Administered 2014-10-27: 11:00:00 via INTRAVENOUS

## 2014-10-27 MED ORDER — LIDOCAINE HCL (PF) 1 % IJ SOLN
INTRAMUSCULAR | Status: DC | PRN
Start: 2014-10-27 — End: 2014-10-27
  Administered 2014-10-27: .5 mL

## 2014-10-27 MED ORDER — BSS IO SOLN
INTRAOCULAR | Status: DC | PRN
  Administered 2014-10-27: 15 mL

## 2014-10-27 MED ORDER — FENTANYL CITRATE (PF) 100 MCG/2ML IJ SOLN
25.0000 ug | INTRAMUSCULAR | Status: AC
  Administered 2014-10-27 (×2): 25 ug via INTRAVENOUS

## 2014-10-27 MED ORDER — CYCLOPENTOLATE-PHENYLEPHRINE 0.2-1 % OP SOLN
1.0000 [drp] | OPHTHALMIC | Status: AC
  Administered 2014-10-27 (×3): 1 [drp] via OPHTHALMIC

## 2014-10-27 MED ORDER — PROVISC 10 MG/ML IO SOLN
INTRAOCULAR | Status: DC | PRN
Start: 2014-10-27 — End: 2014-10-27
  Administered 2014-10-27: 0.85 mL via INTRAOCULAR

## 2014-10-27 MED ORDER — LIDOCAINE HCL 3.5 % OP GEL
1.0000 "application " | Freq: Once | OPHTHALMIC | Status: AC
  Administered 2014-10-27: 1 via OPHTHALMIC

## 2014-10-27 MED ORDER — FENTANYL CITRATE (PF) 100 MCG/2ML IJ SOLN
INTRAMUSCULAR | Status: AC
  Filled 2014-10-27: qty 2

## 2014-10-27 MED ORDER — POVIDONE-IODINE 5 % OP SOLN
OPHTHALMIC | Status: DC | PRN
  Administered 2014-10-27: 1 via OPHTHALMIC

## 2014-10-27 MED ORDER — MIDAZOLAM HCL 2 MG/2ML IJ SOLN
1.0000 mg | INTRAMUSCULAR | Status: DC | PRN
  Administered 2014-10-27: 2 mg via INTRAVENOUS

## 2014-10-27 MED ORDER — LIDOCAINE 3.5 % OP GEL OPTIME - NO CHARGE
OPHTHALMIC | Status: DC | PRN
  Administered 2014-10-27: 1 [drp] via OPHTHALMIC

## 2014-10-27 MED ORDER — TETRACAINE HCL 0.5 % OP SOLN
1.0000 [drp] | OPHTHALMIC | Status: AC
  Administered 2014-10-27 (×3): 1 [drp] via OPHTHALMIC

## 2014-10-27 SURGICAL SUPPLY — 11 items
CLOTH BEACON ORANGE TIMEOUT ST (SAFETY) ×2 IMPLANT
EYE SHIELD UNIVERSAL CLEAR (GAUZE/BANDAGES/DRESSINGS) ×2 IMPLANT
GLOVE BIOGEL PI IND STRL 7.0 (GLOVE) IMPLANT
GLOVE BIOGEL PI INDICATOR 7.0 (GLOVE)
GLOVE EXAM NITRILE MD LF STRL (GLOVE) ×2 IMPLANT
PAD ARMBOARD 7.5X6 YLW CONV (MISCELLANEOUS) ×2 IMPLANT
SIGHTPATH CAT PROC W REG LENS (Ophthalmic Related) ×3 IMPLANT
SYRINGE LUER LOK 1CC (MISCELLANEOUS) ×2 IMPLANT
TAPE SURG TRANSPORE 1 IN (GAUZE/BANDAGES/DRESSINGS) IMPLANT
TAPE SURGICAL TRANSPORE 1 IN (GAUZE/BANDAGES/DRESSINGS) ×2
WATER STERILE IRR 250ML POUR (IV SOLUTION) ×2 IMPLANT

## 2014-10-27 NOTE — H&P (Signed)
I have reviewed the H&P, the patient was re-examined, and I have identified no interval changes in medical condition and plan of care since the history and physical of record  

## 2014-10-27 NOTE — Anesthesia Preprocedure Evaluation (Signed)
Anesthesia Evaluation  Patient identified by MRN, date of birth, ID band Patient awake    Reviewed: Allergy & Precautions, NPO status , Patient's Chart, lab work & pertinent test results  Airway Mallampati: II  TM Distance: >3 FB     Dental  (+) Edentulous Upper, Partial Lower   Pulmonary asthma ,    breath sounds clear to auscultation       Cardiovascular hypertension, Pt. on medications + CAD and + Cardiac Stents   Rhythm:Regular Rate:Bradycardia     Neuro/Psych    GI/Hepatic negative GI ROS,   Endo/Other  Hypothyroidism   Renal/GU      Musculoskeletal   Abdominal   Peds  Hematology   Anesthesia Other Findings   Reproductive/Obstetrics                             Anesthesia Physical Anesthesia Plan  ASA: III  Anesthesia Plan: MAC   Post-op Pain Management:    Induction: Intravenous  Airway Management Planned: Nasal Cannula  Additional Equipment:   Intra-op Plan:   Post-operative Plan:   Informed Consent: I have reviewed the patients History and Physical, chart, labs and discussed the procedure including the risks, benefits and alternatives for the proposed anesthesia with the patient or authorized representative who has indicated his/her understanding and acceptance.     Plan Discussed with:   Anesthesia Plan Comments:         Anesthesia Quick Evaluation

## 2014-10-27 NOTE — Op Note (Signed)
Date of Admission: 10/27/2014  Date of Surgery: 10/27/2014   Pre-Op Dx: Cataract Left Eye  Post-Op Dx: Senile Nuclear Cataract Left  Eye,  Dx Code H25.12  Surgeon: Tonny Branch, M.D.  Assistants: None  Anesthesia: Topical with MAC  Indications: Painless, progressive loss of vision with compromise of daily activities.  Surgery: Cataract Extraction with Intraocular lens Implant Left Eye  Discription: The patient had dilating drops and viscous lidocaine placed into the Left eye in the pre-op holding area. After transfer to the operating room, a time out was performed. The patient was then prepped and draped. Beginning with a 20 degree blade a paracentesis port was made at the surgeon's 2 o'clock position. The anterior chamber was then filled with 1% non-preserved lidocaine. This was followed by filling the anterior chamber with Provisc.  A 2.79mm keratome blade was used to make a clear corneal incision at the temporal limbus.  A bent cystatome needle was used to create a continuous tear capsulotomy. Hydrodissection was performed with balanced salt solution on a Fine canula. The lens nucleus was then removed using the phacoemulsification handpiece. Residual cortex was removed with the I&A handpiece. The anterior chamber and capsular bag were refilled with Provisc. A posterior chamber intraocular lens was placed into the capsular bag with it's injector. The implant was positioned with the Kuglan hook. The Provisc was then removed from the anterior chamber and capsular bag with the I&A handpiece. Stromal hydration of the main incision and paracentesis port was performed with BSS on a Fine canula. The wounds were tested for leak which was negative. The patient tolerated the procedure well. There were no operative complications. The patient was then transferred to the recovery room in stable condition.  Complications: None  Specimen: None  EBL: None  Prosthetic device: Hoya iSert 250, power 20.5 D, SN  [id number].

## 2014-10-27 NOTE — Transfer of Care (Signed)
Immediate Anesthesia Transfer of Care Note  Patient: Amy [name]  Procedure(s) Performed: Procedure(s) with comments: CATARACT EXTRACTION PHACO AND INTRAOCULAR LENS PLACEMENT (IOC) (Left) - CDE:7.86  Patient Location: Short Stay  Anesthesia Type:MAC  Level of Consciousness: awake, alert , oriented and patient cooperative  Airway & Oxygen Therapy: Patient Spontanous Breathing  Post-op Assessment: Report given to RN, Post -op Vital signs reviewed and stable and Patient moving all extremities  Post vital signs: Reviewed and stable  Last Vitals:  Filed Vitals:   10/27/14 1138  BP: 171/57  Pulse:   Temp:   Resp: 40    Complications: No apparent anesthesia complications

## 2014-10-27 NOTE — Discharge Instructions (Signed)

## 2014-10-27 NOTE — Anesthesia Postprocedure Evaluation (Signed)
  Anesthesia Post-op Note  Patient: Amy [name]  Procedure(s) Performed: Procedure(s) with comments: CATARACT EXTRACTION PHACO AND INTRAOCULAR LENS PLACEMENT (IOC) (Left) - CDE:7.86  Patient Location: Short Stay  Anesthesia Type:MAC  Level of Consciousness: awake, alert , oriented and patient cooperative  Airway and Oxygen Therapy: Patient Spontanous Breathing  Post-op Pain: none  Post-op Assessment: Post-op Vital signs reviewed, Patient's Cardiovascular Status Stable, Respiratory Function Stable, Patent Airway, Adequate PO intake and Pain level controlled              Post-op Vital Signs: Reviewed and stable  Last Vitals:  Filed Vitals:   10/27/14 1138  BP: 171/57  Pulse:   Temp:   Resp: 40    Complications: No apparent anesthesia complications

## 2014-10-28 ENCOUNTER — Encounter (HOSPITAL_COMMUNITY): Payer: Self-pay | Admitting: Ophthalmology

## 2015-02-20 DIAGNOSIS — Z1231 Encounter for screening mammogram for malignant neoplasm of breast: Secondary | ICD-10-CM | POA: Diagnosis not present

## 2015-03-21 DIAGNOSIS — J40 Bronchitis, not specified as acute or chronic: Secondary | ICD-10-CM | POA: Diagnosis not present

## 2015-04-18 DIAGNOSIS — E785 Hyperlipidemia, unspecified: Secondary | ICD-10-CM | POA: Diagnosis not present

## 2015-04-18 DIAGNOSIS — I2510 Atherosclerotic heart disease of native coronary artery without angina pectoris: Secondary | ICD-10-CM | POA: Diagnosis not present

## 2015-04-18 DIAGNOSIS — E039 Hypothyroidism, unspecified: Secondary | ICD-10-CM | POA: Diagnosis not present

## 2015-04-18 DIAGNOSIS — E782 Mixed hyperlipidemia: Secondary | ICD-10-CM | POA: Diagnosis not present

## 2015-04-18 DIAGNOSIS — E559 Vitamin D deficiency, unspecified: Secondary | ICD-10-CM | POA: Diagnosis not present

## 2015-04-18 DIAGNOSIS — I10 Essential (primary) hypertension: Secondary | ICD-10-CM | POA: Diagnosis not present

## 2015-05-26 DIAGNOSIS — Z961 Presence of intraocular lens: Secondary | ICD-10-CM | POA: Diagnosis not present

## 2015-05-26 DIAGNOSIS — H26493 Other secondary cataract, bilateral: Secondary | ICD-10-CM | POA: Diagnosis not present

## 2015-07-07 ENCOUNTER — Ambulatory Visit: Payer: Medicare Other | Admitting: Cardiology

## 2015-07-10 ENCOUNTER — Encounter: Payer: Self-pay | Admitting: Cardiology

## 2015-07-10 ENCOUNTER — Encounter: Payer: Self-pay | Admitting: *Deleted

## 2015-07-10 ENCOUNTER — Ambulatory Visit (INDEPENDENT_AMBULATORY_CARE_PROVIDER_SITE_OTHER): Payer: Medicare Other | Admitting: Cardiology

## 2015-07-10 VITALS — BP 120/44 | HR 66 | Ht 59.0 in | Wt 127.0 lb

## 2015-07-10 DIAGNOSIS — I10 Essential (primary) hypertension: Secondary | ICD-10-CM

## 2015-07-10 DIAGNOSIS — I25810 Atherosclerosis of coronary artery bypass graft(s) without angina pectoris: Secondary | ICD-10-CM

## 2015-07-10 DIAGNOSIS — R001 Bradycardia, unspecified: Secondary | ICD-10-CM

## 2015-07-10 DIAGNOSIS — R011 Cardiac murmur, unspecified: Secondary | ICD-10-CM | POA: Diagnosis not present

## 2015-07-10 NOTE — Patient Instructions (Signed)

## 2015-07-10 NOTE — Progress Notes (Signed)
Cardiology Office Note  Date: 07/10/2015   ID: Amy [name], DOB December 23, 1945, MRN [id number]  PCP: Octavio Graves, DO  Evaluating Cardiologist: [name], MD   Chief Complaint  Patient presents with  . Coronary Artery Disease    History of Present Illness: Amy [name] is a 70 y.o. female presenting to establish cardiology follow-up. She is a former patient of the [facility] cardiology practice, last saw Dr. Hamilton Capri in November 2016. She has a history of 60-70% proximal LAD bifurcational disease with first diagonal that was managed medically as of October 2015 following cardiac catheterization at [facility]. She was referred for thoracic surgery opinion at that time and was ultimately managed with DES intervention to the LAD/diagonal bifurcation later that same month. She has been on DAPT since then, and was told to stay on it indefinitely.  She comes in today with her daughter, states that she feels well. She exercises on an elliptical trainer 6 days a week at home for 32 minutes at a time. Her daughter thinks that she is too active. She does not report any angina symptoms or breathlessness beyond NYHA class II. She does have days where she feels very tired.  She has a history of sinus bradycardia, reportedly overall asymptomatic with prior cardiac monitoring showing only occasional atrial bigeminy with sinus bradycardia otherwise. Also has history of cardiac murmur and apparently some degree of aortic valve disease, I was not able to pull up her echocardiogram result.  We reviewed her medications which are outlined below. She reports no intolerances. States she had recent lab work with Dr. Melina Copa. She is no longer on omega-3 supplements. Through diet and exercise she has been able to lose a significant amount of weight since her diagnosis of CAD.  Past Medical History  Diagnosis Date  . Essential hypertension   . Hypothyroidism   . Asthma   . Hyperlipidemia   . CAD (coronary artery  disease)     60-70% proximal LAD/diagonal bifurcational disease October 2015 at [facility] status DES x 2 to LAD/diagonal October 2015    Past Surgical History  Procedure Laterality Date  . Tonsillectomy    . Anal fissure repair    . Abdominal hysterectomy    . Nasal sinus surgery    . Finger surgery    . Cataract extraction w/phaco Right 10/17/2014    Procedure: CATARACT EXTRACTION PHACO AND INTRAOCULAR LENS PLACEMENT (IOC);  Surgeon: Tonny Branch, MD;  Location: [facility]S;  Service: Ophthalmology;  Laterality: Right;  CDE: 4.88  . Cataract extraction w/phaco Left 10/27/2014    Procedure: CATARACT EXTRACTION PHACO AND INTRAOCULAR LENS PLACEMENT (IOC);  Surgeon: Tonny Branch, MD;  Location: [facility]S;  Service: Ophthalmology;  Laterality: Left;  CDE:7.86    Current Outpatient Prescriptions  Medication Sig Dispense Refill  . acetaminophen (TYLENOL) 500 MG tablet Take 1,000 mg by mouth every 6 (six) hours as needed for mild pain.     . Ascorbic Acid (VITAMIN C PO) Take 1 tablet by mouth daily.    Marland Kitchen aspirin 81 MG tablet Take 81 mg by mouth daily.    . Biotin (BIOTIN 5000) 5 MG CAPS Take by mouth.    . chlorthalidone (HYGROTON) 25 MG tablet Take 25 mg by mouth daily.    . Cholecalciferol (VITAMIN D3) 2000 units capsule Take 2,000 Units by mouth daily.    Marland Kitchen levothyroxine (SYNTHROID, LEVOTHROID) 50 MCG tablet Take 50 mcg by mouth daily before breakfast.    . lisinopril (PRINIVIL,ZESTRIL) 5  MG tablet Take 2.5 mg by mouth daily.     . montelukast (SINGULAIR) 10 MG tablet Take 10 mg by mouth at bedtime.    . Multiple Vitamin (MULTIVITAMIN WITH MINERALS) TABS tablet Take 1 tablet by mouth daily.    . Omega-3 Fatty Acids (FISH OIL PO) Take 1 capsule by mouth daily.    . prasugrel (EFFIENT) 10 MG TABS tablet Take 5 mg by mouth daily.     . simvastatin (ZOCOR) 20 MG tablet Take 20 mg by mouth daily.     No current facility-administered medications for this visit.   Allergies:  Sulfa antibiotics   Social  History: The patient  reports that she has never smoked. She has never used smokeless tobacco. She reports that she does not drink alcohol or use illicit drugs.   Family History: The patient's family history includes Heart disease in her father.   ROS:  Please see the history of present illness. Otherwise, complete review of systems is positive for intermittent "wobbliness," reports trouble with balance, no syncope..  All other systems are reviewed and negative.   Physical Exam: VS:  BP 120/44 mmHg  Pulse 66  Ht 4\' 11"  (1.499 m)  Wt 127 lb (57.607 kg)  BMI 25.64 kg/m2  SpO2 98%, BMI Body mass index is 25.64 kg/(m^2).  Wt Readings from Last 3 Encounters:  07/10/15 127 lb (57.607 kg)  10/12/14 130 lb (58.968 kg)  12/21/13 152 lb (68.947 kg)    General: Thin elderly woman, appears comfortable at rest. HEENT: Conjunctiva and lids normal, oropharynx clear. Neck: Supple, no elevated JVP or carotid bruits, no thyromegaly. Lungs: Clear to auscultation, nonlabored breathing at rest. Cardiac: Regular rate and rhythm, no S3, 2/6 systolic murmur, no pericardial rub. Abdomen: Soft, nontender, bowel sounds present, no guarding or rebound. Extremities: No pitting edema, distal pulses 2+. Skin: Warm and dry. Musculoskeletal: No kyphosis. Neuropsychiatric: Alert and oriented x3, affect grossly appropriate.  ECG: I personally reviewed the tracing from 10/12/2014 which showed sinus bradycardia with intermittent atrial bigeminy.  Recent Labwork: 10/12/2014: BUN 22*; Creatinine, Ser 0.85; Hemoglobin 12.5; Platelets 165; Potassium 3.9; Sodium 132*   Other Studies Reviewed Today:  Cardiac catheterization 11/04/2013 Mikel Cella): Procedure Narrative:  Initial attempt was made to approach this just through transradial approach, however I was not able to wire the radial artery, due to small vessel size.  The right groin was prepped and draped in the usual fashion.  The patient received Versed and fentanyl  for moderate sedation.  1% Xylocaine was used to infiltrate the skin and subcutaneous tissue below the inguinal ligament.  A 26F micropuncture catheter was inserted under Fluoroscopy with Ultrasound guidance to obtain access.  Permanent ultrasound images were stored on the chart.   RightIliofemoral angiography was performed.  The 26F catheter was exchanged with a 6 French short sheath.  6 F JL 4 and JR 4 diagnostic catheters were used to perform selective coronary angiography.  Engaging the left anterior descending artery was problematic, due to the relatively large left circumflex size, and short left main.  I tried a 6 [place] a.l. one, 6 [place] multipurpose, 6 [place] XB LAD, and I was unable to obtain adequate images of the left anterior descending.  I started patient on Angiomax and tried to place the wire in the LAD selection obtain better images, this was also unsuccessful.  Finally, a 6 [place] S. L4 launcher catheter was used, and I was able to obtain images of the left anterior descending.  Hemodynamic findings:   - AO Pressures: 172 / 57.  - LV Pressures: systolic 0000000 LV End-diastolic 25.    Coronary anatomy:  Left main: Normal  Left anterior descending: Is a relatively small in diameter, there is 60% proximal stenosis right before the bifurcation with the first diagonal, which is large. The LAD and diagonal have almost the same diameter. At the bifurcation, diagonal has 70%   stenosis, LAD has 70% stenosis as well, making this a Medina classification 1, 1, 1 lesion, with equal branch diameter, about 2.5 mm each.  Left circumflex: Large, dominant, reaches the apex, angiographically normal  Right coronary artery: Small, nondominant, with mild to moderate diffuse disease  Left dominant    Bypass grafts: None    Left ventriculogram: Not done.    Complications: None   Assessment and Plan:  1. CAD status post DES 2 to the LAD/diagonal bifurcation  with kissing balloon crush technique in October 2015 at [place]. She has been on DAPT, was told to continue indefinitely. No angina symptoms with good functional capacity. Discussed continued exercise with appropriate rest intervals.  2. Heart murmur, possibly aortic valve disease. Echocardiogram will be obtained to clarify.  3. Continues on statin therapy for secondary prevention. Requesting most recent lipid panel from Dr. Melina Copa.  4. Essential hypertension, continue on lisinopril and chlorthalidone.  5. Bradycardia and atrial bigeminy. Not entirely clear that this is symptom provoking. Continue observation. Not on beta blocker due to this.  Current medicines were reviewed with the patient today.   Orders Placed This Encounter  Procedures  . ECHOCARDIOGRAM COMPLETE    Disposition: FU with me in 6 months.   Signed, Satira Sark, MD, [facility] 07/10/2015 2:23 PM    [facility] [place], [place], [place] [address] Phone: [phone]; Fax: [phone]

## 2015-07-12 ENCOUNTER — Other Ambulatory Visit: Payer: Self-pay

## 2015-07-12 ENCOUNTER — Ambulatory Visit (INDEPENDENT_AMBULATORY_CARE_PROVIDER_SITE_OTHER): Payer: Medicare Other

## 2015-07-12 DIAGNOSIS — R011 Cardiac murmur, unspecified: Secondary | ICD-10-CM

## 2015-07-12 DIAGNOSIS — I25810 Atherosclerosis of coronary artery bypass graft(s) without angina pectoris: Secondary | ICD-10-CM

## 2015-07-13 ENCOUNTER — Telehealth: Payer: Self-pay | Admitting: *Deleted

## 2015-07-13 NOTE — Telephone Encounter (Signed)
-----   Message from Satira Sark, MD sent at 07/12/2015 12:13 PM EDT ----- Results reviewed. LVEF is normal range. There is aortic valve sclerosis but no stenosis. This would explain her heart murmur which is relatively benign at this time. A copy of this test will also be sent to [place], DO.

## 2015-07-13 NOTE — Telephone Encounter (Signed)
Patient informed and copy sent to PCP. 

## 2015-07-31 ENCOUNTER — Other Ambulatory Visit: Payer: Self-pay | Admitting: *Deleted

## 2015-07-31 MED ORDER — CHLORTHALIDONE 25 MG PO TABS: 25.0000 mg | ORAL_TABLET | Freq: Every day | ORAL | Status: DC

## 2015-09-08 ENCOUNTER — Other Ambulatory Visit: Payer: Self-pay | Admitting: *Deleted

## 2015-09-08 MED ORDER — LISINOPRIL 5 MG PO TABS: 2.5000 mg | ORAL_TABLET | Freq: Every day | ORAL | 3 refills | Status: DC

## 2015-09-22 DIAGNOSIS — Z23 Encounter for immunization: Secondary | ICD-10-CM | POA: Diagnosis not present

## 2015-09-26 DIAGNOSIS — L400 Psoriasis vulgaris: Secondary | ICD-10-CM | POA: Diagnosis not present

## 2015-09-26 DIAGNOSIS — L280 Lichen simplex chronicus: Secondary | ICD-10-CM | POA: Diagnosis not present

## 2015-10-23 ENCOUNTER — Telehealth: Payer: Self-pay | Admitting: Cardiology

## 2015-10-23 NOTE — Telephone Encounter (Signed)
Patient advised via vm that effient is no longer sampled.

## 2015-10-23 NOTE — Telephone Encounter (Signed)
Patient called wanting to request samples of prasugrel (EFFIENT) 10 MG TABS tablet  Medication  States that she is in the donut hole now and cannot afford medication .

## 2015-10-30 DIAGNOSIS — I2510 Atherosclerotic heart disease of native coronary artery without angina pectoris: Secondary | ICD-10-CM | POA: Diagnosis not present

## 2015-10-30 DIAGNOSIS — E039 Hypothyroidism, unspecified: Secondary | ICD-10-CM | POA: Diagnosis not present

## 2015-10-30 DIAGNOSIS — E559 Vitamin D deficiency, unspecified: Secondary | ICD-10-CM | POA: Diagnosis not present

## 2015-10-30 DIAGNOSIS — E785 Hyperlipidemia, unspecified: Secondary | ICD-10-CM | POA: Diagnosis not present

## 2015-11-24 ENCOUNTER — Other Ambulatory Visit: Payer: Self-pay | Admitting: *Deleted

## 2015-11-24 ENCOUNTER — Encounter: Payer: Self-pay | Admitting: *Deleted

## 2015-11-24 MED ORDER — PRASUGREL HCL 10 MG PO TABS: 5.0000 mg | ORAL_TABLET | Freq: Every day | ORAL | 3 refills | Status: DC

## 2015-11-29 DIAGNOSIS — Z961 Presence of intraocular lens: Secondary | ICD-10-CM | POA: Diagnosis not present

## 2015-11-29 DIAGNOSIS — H26493 Other secondary cataract, bilateral: Secondary | ICD-10-CM | POA: Diagnosis not present

## 2015-12-07 DIAGNOSIS — L280 Lichen simplex chronicus: Secondary | ICD-10-CM | POA: Diagnosis not present

## 2015-12-07 DIAGNOSIS — L400 Psoriasis vulgaris: Secondary | ICD-10-CM | POA: Diagnosis not present

## 2015-12-18 DIAGNOSIS — H26493 Other secondary cataract, bilateral: Secondary | ICD-10-CM | POA: Diagnosis not present

## 2015-12-18 DIAGNOSIS — H26491 Other secondary cataract, right eye: Secondary | ICD-10-CM | POA: Diagnosis not present

## 2016-01-22 ENCOUNTER — Other Ambulatory Visit: Payer: Self-pay | Admitting: *Deleted

## 2016-01-22 MED ORDER — CHLORTHALIDONE 25 MG PO TABS: 25.0000 mg | ORAL_TABLET | Freq: Every day | ORAL | 1 refills | Status: DC

## 2016-02-26 DIAGNOSIS — Z01419 Encounter for gynecological examination (general) (routine) without abnormal findings: Secondary | ICD-10-CM | POA: Diagnosis not present

## 2016-02-26 DIAGNOSIS — Z1272 Encounter for screening for malignant neoplasm of vagina: Secondary | ICD-10-CM | POA: Diagnosis not present

## 2016-02-26 DIAGNOSIS — Z6824 Body mass index (BMI) 24.0-24.9, adult: Secondary | ICD-10-CM | POA: Diagnosis not present

## 2016-02-26 DIAGNOSIS — Z1231 Encounter for screening mammogram for malignant neoplasm of breast: Secondary | ICD-10-CM | POA: Diagnosis not present

## 2016-02-26 DIAGNOSIS — R928 Other abnormal and inconclusive findings on diagnostic imaging of breast: Secondary | ICD-10-CM | POA: Diagnosis not present

## 2016-02-28 DIAGNOSIS — L400 Psoriasis vulgaris: Secondary | ICD-10-CM | POA: Diagnosis not present

## 2016-03-13 DIAGNOSIS — R928 Other abnormal and inconclusive findings on diagnostic imaging of breast: Secondary | ICD-10-CM | POA: Diagnosis not present

## 2016-03-20 ENCOUNTER — Other Ambulatory Visit: Payer: Self-pay | Admitting: *Deleted

## 2016-03-20 MED ORDER — PRASUGREL HCL 10 MG PO TABS: 5.0000 mg | ORAL_TABLET | Freq: Every day | ORAL | 3 refills | Status: DC

## 2016-04-25 DIAGNOSIS — L409 Psoriasis, unspecified: Secondary | ICD-10-CM | POA: Diagnosis not present

## 2016-04-25 DIAGNOSIS — E782 Mixed hyperlipidemia: Secondary | ICD-10-CM | POA: Diagnosis not present

## 2016-04-25 DIAGNOSIS — F419 Anxiety disorder, unspecified: Secondary | ICD-10-CM | POA: Diagnosis not present

## 2016-04-25 DIAGNOSIS — I10 Essential (primary) hypertension: Secondary | ICD-10-CM | POA: Diagnosis not present

## 2016-04-25 DIAGNOSIS — E039 Hypothyroidism, unspecified: Secondary | ICD-10-CM | POA: Diagnosis not present

## 2016-05-20 DIAGNOSIS — L280 Lichen simplex chronicus: Secondary | ICD-10-CM | POA: Diagnosis not present

## 2016-05-20 DIAGNOSIS — L570 Actinic keratosis: Secondary | ICD-10-CM | POA: Diagnosis not present

## 2016-05-23 DIAGNOSIS — Z6826 Body mass index (BMI) 26.0-26.9, adult: Secondary | ICD-10-CM | POA: Diagnosis not present

## 2016-05-23 DIAGNOSIS — Z124 Encounter for screening for malignant neoplasm of cervix: Secondary | ICD-10-CM | POA: Diagnosis not present

## 2016-05-23 DIAGNOSIS — E559 Vitamin D deficiency, unspecified: Secondary | ICD-10-CM | POA: Diagnosis not present

## 2016-05-23 DIAGNOSIS — Z1211 Encounter for screening for malignant neoplasm of colon: Secondary | ICD-10-CM | POA: Diagnosis not present

## 2016-05-23 DIAGNOSIS — I10 Essential (primary) hypertension: Secondary | ICD-10-CM | POA: Diagnosis not present

## 2016-05-23 DIAGNOSIS — Z1231 Encounter for screening mammogram for malignant neoplasm of breast: Secondary | ICD-10-CM | POA: Diagnosis not present

## 2016-05-23 DIAGNOSIS — Z0000 Encounter for general adult medical examination without abnormal findings: Secondary | ICD-10-CM | POA: Diagnosis not present

## 2016-05-23 DIAGNOSIS — E7801 Familial hypercholesterolemia: Secondary | ICD-10-CM | POA: Diagnosis not present

## 2016-06-10 ENCOUNTER — Telehealth: Payer: Self-pay | Admitting: Cardiology

## 2016-06-10 NOTE — Telephone Encounter (Signed)
Attempted to return call.  Busy.

## 2016-06-10 NOTE — Telephone Encounter (Signed)
Feeling really tired lately. Wanted to know if her low heart rate could be causing

## 2016-06-11 NOTE — Telephone Encounter (Signed)
Left message to return call 

## 2016-06-19 NOTE — Telephone Encounter (Signed)
Agree with scheduling office visit as she is overdue. I met her in June of last year. Her history includes bradycardia and she has undergone prior cardiac monitoring for this. We can discuss her symptoms and determine if any further testing is needed. 

## 2016-06-19 NOTE — Telephone Encounter (Signed)
Patient notified and verbalized understanding. 

## 2016-06-19 NOTE — Telephone Encounter (Signed)
Patient is returning call in regards to a message that was left last week.  Please call patient After 1:00pm.

## 2016-06-19 NOTE — Telephone Encounter (Signed)
Really low heart rate, has always been low.  Mostly stays in the 50's.  Has had a few in the 40's range.   Feeling better this week.  Does not notice any dizziness.  No chest pain or SOB either.  Did notice some tiredness a couple weeks ago, but feeling better this week.  She did not think that this was a big deal, but promised her daughter that she would call.    OV scheduled for Thursday, 07/04/2016 with Dr. Domenic Polite as she is overdue for follow up.

## 2016-07-03 NOTE — Progress Notes (Signed)
Cardiology Office Note  Date: 07/04/2016   ID: HAGEN TIDD, DOB 07-17-1945, MRN [id number]  PCP: Patient, No Pcp Per  Primary Cardiologist: [name], MD   Chief Complaint  Patient presents with  . Coronary Artery Disease    History of Present Illness: Amy [name] is a 71 y.o. female that I met in June 2017. She presents with her daughter for a follow-up visit. She states that over the last few months she has been very fatigued, has to rest when she is doing house chores. Also fatigued when she uses her exercise machine. She has noted that her heart rate has been slow, generally in the 50s and she has have had some recordings using either her blood pressure machine or exercise machine with heart rates in the 30s to 40s. She has had no syncope.  Follow-up echocardiogram from June 2017 is outlined below.  I personally reviewed her ECG today which shows sinus bradycardia.  We went over her medications, she is not on any heart rate lowering drugs.  Past Medical History:  Diagnosis Date  . Asthma   . CAD (coronary artery disease)    60-70% proximal LAD/diagonal bifurcational disease October 2015 at [facility] status DES x 2 to LAD/diagonal October 2015  . Essential hypertension   . Hyperlipidemia   . Hypothyroidism     Past Surgical History:  Procedure Laterality Date  . ABDOMINAL HYSTERECTOMY    . ANAL FISSURE REPAIR    . CATARACT EXTRACTION W/PHACO Right 10/17/2014   Procedure: CATARACT EXTRACTION PHACO AND INTRAOCULAR LENS PLACEMENT ([place]);  Surgeon: Tonny Branch, MD;  Location: [facility];  Service: Ophthalmology;  Laterality: Right;  CDE: 4.88  . CATARACT EXTRACTION W/PHACO Left 10/27/2014   Procedure: CATARACT EXTRACTION PHACO AND INTRAOCULAR LENS PLACEMENT (IOC);  Surgeon: Tonny Branch, MD;  Location: [facility];  Service: Ophthalmology;  Laterality: Left;  CDE:7.86  . FINGER SURGERY    . NASAL SINUS SURGERY    . TONSILLECTOMY      Current Outpatient Prescriptions    Medication Sig Dispense Refill  . acetaminophen (TYLENOL) 500 MG tablet Take 1,000 mg by mouth every 6 (six) hours as needed for mild pain.     Marland Kitchen aspirin 81 MG tablet Take 81 mg by mouth daily.    . Biotin (BIOTIN 5000) 5 MG CAPS Take by mouth.    . chlorthalidone (HYGROTON) 25 MG tablet Take 1 tablet (25 mg total) by mouth daily. 30 tablet 1  . Cholecalciferol (VITAMIN D3) 2000 units capsule Take 2,000 Units by mouth daily.    . clobetasol (TEMOVATE) 0.05 % external solution Apply 1 application topically 2 (two) times daily.    Marland Kitchen levothyroxine (SYNTHROID, LEVOTHROID) 50 MCG tablet Take 50 mcg by mouth daily before breakfast.    . lisinopril (PRINIVIL,ZESTRIL) 5 MG tablet Take 0.5 tablets (2.5 mg total) by mouth daily. 45 tablet 3  . Multiple Vitamin (MULTIVITAMIN WITH MINERALS) TABS tablet Take 1 tablet by mouth daily.    . prasugrel (EFFIENT) 10 MG TABS tablet Take 0.5 tablets (5 mg total) by mouth daily. 45 tablet 3  . simvastatin (ZOCOR) 20 MG tablet Take 20 mg by mouth daily.     No current facility-administered medications for this visit.    Allergies:  Sulfa antibiotics   Social History: The patient  reports that she has never smoked. She has never used smokeless tobacco. She reports that she does not drink alcohol or use drugs.   ROS:  Please  see the history of present illness. Otherwise, complete review of systems is positive for none.  All other systems are reviewed and negative.   Physical Exam: VS:  BP (!) 170/72   Pulse 66   Ht 5' (1.524 m)   Wt 128 lb (58.1 kg)   SpO2 97%   BMI 25.00 kg/m , BMI Body mass index is 25 kg/m.  Wt Readings from Last 3 Encounters:  07/04/16 128 lb (58.1 kg)  07/10/15 127 lb (57.6 kg)  10/12/14 130 lb (59 kg)    General: Thin elderly woman, appears comfortable at rest. HEENT: Conjunctiva and lids normal, oropharynx clear. Neck: Supple, no elevated JVP or carotid bruits, no thyromegaly. Lungs: Clear to auscultation, nonlabored breathing  at rest. Cardiac: Regular rate and rhythm, no S3, 2/6 systolic murmur, no pericardial rub. Abdomen: Soft, nontender, bowel sounds present, no guarding or rebound. Extremities: No pitting edema, distal pulses 2+. Skin: Warm and dry. Musculoskeletal: No kyphosis. Neuropsychiatric: Alert and oriented x3, affect grossly appropriate.  ECG: I personally reviewed the tracing from 10/12/2014 which showed sinus bradycardia with intermittent atrial bigeminy.  Recent Labwork:  March 2017: BUN 15, creatinine 0.6, AST 21, ALT 20, triglycerides 57, cholesterol 173, HDL 101, LDL 61, potassium 3.9, TSH 3.47, hemoglobin 12.2, platelets 206  Other Studies Reviewed Today:  Echocardiogram 07/12/2015: Study Conclusions  - Left ventricle: The cavity size was normal. Wall thickness was   normal. Systolic function was vigorous. The estimated ejection   fraction was in the range of 65% to 70%. Normal global   longitudinal strain of -21.8%. Wall motion was normal; there were   no regional wall motion abnormalities. Doppler parameters are   consistent with abnormal left ventricular relaxation (grade 1   diastolic dysfunction). - Aortic valve: Moderately calcified annulus. Trileaflet; mildly   calcified leaflets. There was trivial regurgitation. - Mitral valve: Calcified annulus. There was trivial regurgitation. - Right atrium: Central venous pressure (est): 8 mm Hg. - Tricuspid valve: There was trivial regurgitation. - Pulmonary arteries: PA peak pressure: 32 mm Hg (S). - Pericardium, extracardiac: There was no pericardial effusion.  Impressions:  - Normal LV wall thickness with LVEF 65-70%. Grade 1 diastolic   dysfunction with normal estimated LV filling pressure. MAC with   trivial mitral regurgitation. Sclerotic aortic valve with trivial   aortic regurgitation. Trivial tricuspid regurgitation with normal   estimated PASP 32 mmHg.  Assessment and Plan:  1. Fatigue with exertion. No reported angina  symptoms. She has been bradycardic, significantly so based on home blood pressure monitor and an exercise machine measurement. She actually reports heart rates as low as the 30s. No syncope. Her ECG today shows sinus bradycardia in the 50s. She is not on any heart rate lowering drugs. Plan to obtain a 48-hour Holter monitor to get a better sense of her heart rate variability and rhythm.  2. CAD status post DES 2 to the LAD/diagonal bifurcation in 2015 at [place]. She continues on DAPT which was recommended indefinitely. No active angina. If her heart rate is not contributing to fatigue we may need to consider follow-up ischemic testing.  3. Heart murmur with aortic valve sclerosis by echocardiogram.  4. Essential hypertension, blood pressure elevated today. No changes made to medicines as yet.  Current medicines were reviewed with the patient today.   Orders Placed This Encounter  Procedures  . Holter monitor - 48 hour  . EKG 12-Lead    Disposition: Call with test results and determine disposition.  Signed,  Satira Sark, MD, [facility] 07/04/2016 4:12 PM    [facility] [place], [place], [place] [address] Phone: [phone]; Fax: [phone]

## 2016-07-04 ENCOUNTER — Ambulatory Visit (INDEPENDENT_AMBULATORY_CARE_PROVIDER_SITE_OTHER): Payer: Medicare Other | Admitting: Cardiology

## 2016-07-04 ENCOUNTER — Ambulatory Visit (INDEPENDENT_AMBULATORY_CARE_PROVIDER_SITE_OTHER): Payer: Medicare Other

## 2016-07-04 ENCOUNTER — Encounter: Payer: Self-pay | Admitting: Cardiology

## 2016-07-04 VITALS — BP 170/72 | HR 66 | Ht 60.0 in | Wt 128.0 lb

## 2016-07-04 DIAGNOSIS — I25810 Atherosclerosis of coronary artery bypass graft(s) without angina pectoris: Secondary | ICD-10-CM

## 2016-07-04 DIAGNOSIS — R5383 Other fatigue: Secondary | ICD-10-CM

## 2016-07-04 DIAGNOSIS — R001 Bradycardia, unspecified: Secondary | ICD-10-CM

## 2016-07-04 DIAGNOSIS — I10 Essential (primary) hypertension: Secondary | ICD-10-CM

## 2016-07-04 DIAGNOSIS — R000 Tachycardia, unspecified: Secondary | ICD-10-CM | POA: Diagnosis not present

## 2016-07-04 NOTE — Patient Instructions (Signed)
Medication Instructions:  Your physician recommends that you continue on your current medications as directed. Please refer to the Current Medication list given to you today.  Labwork: NONE  Testing/Procedures: Your physician has recommended that you wear a holter monitor FOR 48 HOURS. Holter monitors are medical devices that record the heart's electrical activity. Doctors most often use these monitors to diagnose arrhythmias. Arrhythmias are problems with the speed or rhythm of the heartbeat. The monitor is a small, portable device. You can wear one while you do your normal daily activities. This is usually used to diagnose what is causing palpitations/syncope (passing out).   Follow-Up: Your physician recommends that you schedule a follow-up appointment PENDING TEST RESULTS   Any Other Special Instructions Will Be Listed Below (If Applicable).  If you need a refill on your cardiac medications before your next appointment, please call your pharmacy.

## 2016-07-05 DIAGNOSIS — R001 Bradycardia, unspecified: Secondary | ICD-10-CM

## 2016-07-05 DIAGNOSIS — R5383 Other fatigue: Secondary | ICD-10-CM | POA: Diagnosis not present

## 2016-07-08 ENCOUNTER — Telehealth: Payer: Self-pay | Admitting: Cardiology

## 2016-07-08 NOTE — Telephone Encounter (Signed)
Doesn't feel there is a good reading due to her being so nervous the first night and the second day the screen was blank  Patient would like to be called

## 2016-07-08 NOTE — Telephone Encounter (Signed)
Concerned about the data that we got on the first night - was very nervous & restless.  The second night got up to bathroom & was on, but when got up later that morning the screen was blank.  Informed patient that we will upload & see what data we get.  Can expect up to 2-3 day turn around for results.  Patient verbalized understanding.

## 2016-07-10 ENCOUNTER — Telehealth: Payer: Self-pay

## 2016-07-10 DIAGNOSIS — I495 Sick sinus syndrome: Secondary | ICD-10-CM

## 2016-07-10 NOTE — Telephone Encounter (Signed)
[employer] called to let us know that the patient is having low heart rates. ( 29 bpm) Will forward to Dr. Domenic Polite.

## 2016-07-10 NOTE — Telephone Encounter (Signed)
I spoke with Amy [name], she will take care of it.

## 2016-07-10 NOTE — Telephone Encounter (Signed)
I clearly need more information than this. Has anyone contacted the patient to see how she is doing? We need to get actual strips to see what her rhythm is doing.

## 2016-07-10 NOTE — Telephone Encounter (Signed)
I found out that [employer] is days behind on this one. It is an [place] patient that wore a 48 hr holter monitor and it was turned in on Monday. Once I looked in the patient chart I saw that the monitor had already been turned in. I called [facility] and spoke with Kirke Shaggy about this and I think she contacted Mitch with [employer] to investagate the problem.

## 2016-07-10 NOTE — Telephone Encounter (Signed)
LMTCB. Routed strips to Dr. Domenic Polite.

## 2016-07-10 NOTE — Telephone Encounter (Signed)
Patient notified. Symptoms are the same as they were per recent office visit. Advised patient if she felt any worse she needed to go to the ER. Order put in for ASAP EP referral.

## 2016-07-10 NOTE — Telephone Encounter (Signed)
Someone needs to contact the patient and make sure that she is doing okay, and I need to see the rhythm strips as soon as possible.

## 2016-07-10 NOTE — Telephone Encounter (Signed)
Attempted to reach patient multiple times today. Call home and mobile number and left message on both. Attempted to contact daughter April as well but was unable to leave a message

## 2016-07-10 NOTE — Telephone Encounter (Signed)
-----   Message from Merlene Laughter, LPN sent at 06/09/9792  2:36 PM EDT -----   ----- Message ----- From: Satira Sark, MD Sent: 07/10/2016   2:36 PM To: Merlene Laughter, LPN  Results reviewed. Findings were abnormal as discussed above with concern for sinus node dysfunction and possible tachycardia-bradycardia syndrome. She has not had recent syncope but did voice fatigue and weakness at her recent office visit. She is not on any medications to specifically slow her heart rate. I would have her referred for EP consultation ASAP to discuss possibility of pacemaker. It does not sound like we necessarily need to send her to the hospital unless she is having acutely worsening symptoms or frank syncope - would check with her first. A copy of this test should be forwarded to Patient, No Pcp Per.

## 2016-07-15 ENCOUNTER — Telehealth: Payer: Self-pay | Admitting: Cardiology

## 2016-07-15 NOTE — Telephone Encounter (Signed)
Pt daughter says pt c/o "heart racing" and fatigue especially in the morning over this past weekend - says HR has been up and down (from 35-60) have been checking with BP machine - pt denies any CP/dizzniess - pt has appt with Dr Lovena Le 6/14 - advised pt daughter that if symptoms continue to worsen with fatigue and heart racing to report to [facility] ED for evaluation. Routed to provider FYI

## 2016-07-15 NOTE — Telephone Encounter (Signed)
Heart rate has increased to the 60's and they are concerned about her needing to be seen before 6/14?

## 2016-07-18 ENCOUNTER — Ambulatory Visit (INDEPENDENT_AMBULATORY_CARE_PROVIDER_SITE_OTHER): Payer: Medicare Other | Admitting: Internal Medicine

## 2016-07-18 ENCOUNTER — Encounter: Payer: Self-pay | Admitting: Internal Medicine

## 2016-07-18 VITALS — BP 142/50 | HR 53 | Ht 60.0 in | Wt 131.0 lb

## 2016-07-18 DIAGNOSIS — I495 Sick sinus syndrome: Secondary | ICD-10-CM | POA: Diagnosis not present

## 2016-07-18 DIAGNOSIS — R002 Palpitations: Secondary | ICD-10-CM | POA: Insufficient documentation

## 2016-07-18 NOTE — Progress Notes (Signed)
HPI Amy [name] is referred today by Dr. Domenic Polite for evaluation of sinus bradycardia. The patient has CAD and underwent stenting of her coronary arteries (LAD/DX) in 2015. She has been on effient since. She notes that she has been feeling tired for almost 6 months. The patient has not had syncope. She wore a cardiac monitor which revealed daytime bradycardia in the 30's with pauses while awake of 3 seconds. She has both intermittent dizziness and fatigue. Labs have been negative.  Allergies  Allergen Reactions  . Sulfa Antibiotics Rash     Current Outpatient Prescriptions  Medication Sig Dispense Refill  . aspirin 81 MG tablet Take 81 mg by mouth daily.    . Biotin (BIOTIN 5000) 5 MG CAPS Take by mouth.    . chlorthalidone (HYGROTON) 25 MG tablet Take 1 tablet (25 mg total) by mouth daily. 30 tablet 1  . Cholecalciferol (VITAMIN D3) 2000 units capsule Take 2,000 Units by mouth daily.    . clobetasol (TEMOVATE) 0.05 % external solution Apply 1 application topically 2 (two) times daily.    Marland Kitchen levothyroxine (SYNTHROID, LEVOTHROID) 50 MCG tablet Take 50 mcg by mouth daily before breakfast.    . lisinopril (PRINIVIL,ZESTRIL) 5 MG tablet Take 0.5 tablets (2.5 mg total) by mouth daily. 45 tablet 3  . Multiple Vitamin (MULTIVITAMIN WITH MINERALS) TABS tablet Take 1 tablet by mouth daily.    . prasugrel (EFFIENT) 10 MG TABS tablet Take 0.5 tablets (5 mg total) by mouth daily. 45 tablet 3  . simvastatin (ZOCOR) 20 MG tablet Take 20 mg by mouth daily.     No current facility-administered medications for this visit.      Past Medical History:  Diagnosis Date  . Asthma   . CAD (coronary artery disease)    60-70% proximal LAD/diagonal bifurcational disease October 2015 at [facility] status DES x 2 to LAD/diagonal October 2015  . Essential hypertension   . Hyperlipidemia   . Hypothyroidism     ROS:   All systems reviewed and negative except as noted in the HPI.   Past Surgical  History:  Procedure Laterality Date  . ABDOMINAL HYSTERECTOMY    . ANAL FISSURE REPAIR    . CATARACT EXTRACTION W/PHACO Right 10/17/2014   Procedure: CATARACT EXTRACTION PHACO AND INTRAOCULAR LENS PLACEMENT ([place]);  Surgeon: Tonny Branch, MD;  Location: [facility];  Service: Ophthalmology;  Laterality: Right;  CDE: 4.88  . CATARACT EXTRACTION W/PHACO Left 10/27/2014   Procedure: CATARACT EXTRACTION PHACO AND INTRAOCULAR LENS PLACEMENT ([facility]);  Surgeon: Tonny Branch, MD;  Location: [facility];  Service: Ophthalmology;  Laterality: Left;  CDE:7.86  . FINGER SURGERY    . NASAL SINUS SURGERY    . TONSILLECTOMY       Family History  Problem Relation Age of Onset  . Heart disease Father      Social History   Social History  . Marital status: Widowed    Spouse name: N/A  . Number of children: N/A  . Years of education: N/A   Occupational History  . Not on file.   Social History Main Topics  . Smoking status: Never Smoker  . Smokeless tobacco: Never Used  . Alcohol use No  . Drug use: No  . Sexual activity: No   Other Topics Concern  . Not on file   Social History Narrative  . No narrative on file     BP (!) 142/50   Pulse (!) 53   Ht 5' (1.524  m)   Wt 131 lb (59.4 kg)   SpO2 100%   BMI 25.58 kg/m   Physical Exam:  Well appearing 71 yo woman, NAD HEENT: Unremarkable Neck:  6 cm JVD, no thyromegally Lymphatics:  No adenopathy Back:  No CVA tenderness Lungs:  Clear with no wheezes HEART:  Regular rate rhythm, no murmurs, no rubs, no clicks Abd:  soft, positive bowel sounds, no organomegally, no rebound, no guarding Ext:  2 plus pulses, no edema, no cyanosis, no clubbing Skin:  No rashes no nodules Neuro:  CN II through XII intact, motor grossly intact  EKG - sinus bradycardia  48 hour holter - sinus bradycardia, sinus tachycardia and NSR with pauses of 3 seconds.  Assess/Plan: 1. Symptomatic sinus node dysfunction - I have discussed the  indications/risks/benefits/goals/expectations of PPM insertion and she wishes to proceed. I am not convinced that all of her symptoms will be improved by her PPM but I think she has enough bradycardia and sinus node dysfunction for the device to be placed. 2. CAD - she is s/p PCI over 3 years ago. She will hold her effient for 2 days before the procedure. I am not sure that she will need additional effient. 3. HTN - her blood pressure is a bit elevated today. Would consider adding a calcium channel blocker once her PPM is in place. 4. Palpitations - the etiology is unclear. She did not have atrial fib on her heart monitor but she is at risk for atrial fib and we will be able to diagnose this if she has atrial fib after her PPM is inserted.  Mikle Bosworth.D.

## 2016-07-18 NOTE — Patient Instructions (Addendum)
Medication Instructions:  Your physician recommends that you continue on your current medications as directed. Please refer to the Current Medication list given to you today.   Labwork: BMET / CBC - lab appointment scheduled for 07/26/16 at the [facility] office  Testing/Procedures: Your physician has recommended that you have a pacemaker inserted. A pacemaker is a small device that is placed under the skin of your chest or abdomen to help control abnormal heart rhythms. This device uses electrical pulses to prompt the heart to beat at a normal rate. Pacemakers are used to treat heart rhythms that are too slow. Wire (leads) are attached to the pacemaker that goes into the chambers of you heart. This is done in the hospital and usually requires and overnight stay. Please see the instruction sheet given to you today for more information.    Follow-Up: Follow-up for a wound check in the Device Clinic 10-14 days from date of procedure and with Dr. Lovena Le 91 days from date of implant..   Any Other Special Instructions Will Be Listed Below ---  HOLD Effient and Aspirin 2 days prior to procedure. Last dose will be on 07/27/16  Please wash with the CHG Soap the night before and morning of procedure (follow instruction page "Preparing For Surgery").   Report to the [place] [facility] at 07/30/16 at 5:30 AM  Nothing to eat or drink after midnight the night before procedure  Do not take any medication the morning of procedrue  Plan 1 night stay    If you need a refill on your cardiac medications before your next appointment, please call your pharmacy.

## 2016-07-19 NOTE — Addendum Note (Signed)
Addended by: Mendel Ryder on: 07/19/2016 01:55 PM   Modules accepted: Orders

## 2016-07-23 ENCOUNTER — Telehealth: Payer: Self-pay | Admitting: Cardiology

## 2016-07-23 NOTE — Telephone Encounter (Signed)
Daughter really concerned with her heart rate, stated that she is not sleeping due to scared of not waking up. Patient has been seeing spots does not pass out.   Stated that she saw Dr Lovena Le and he was suppose get with Dr Domenic Polite about her and get back with patient.  Would like a call back

## 2016-07-23 NOTE — Telephone Encounter (Signed)
Patients HR was been running around 38-48 everyday. Patient did have one day where HR was 50-60, but 60 is the highest it has been. Patient is not sleeping at night due to being afraid she is not going to wake up. Patient might sleep 2 hours total all day/night per daughter. Patient has been very tired and this is not improving. Daughter is very concerned that patient is getting worse. Per phone note on 6/11 if symptoms progressed patient needs to be seen in the ER. Advised daughter of this recommendation and stated patient needs to be seen in the ER. Daughter stated she would talk to patient and try to get patient to go to [facility].

## 2016-07-26 ENCOUNTER — Other Ambulatory Visit: Payer: Medicare Other | Admitting: *Deleted

## 2016-07-26 DIAGNOSIS — I495 Sick sinus syndrome: Secondary | ICD-10-CM

## 2016-07-27 LAB — BASIC METABOLIC PANEL
BUN/Creatinine Ratio: 20 (ref 12–28)
BUN: 19 mg/dL (ref 8–27)
CO2: 24 mmol/L (ref 20–29)
Calcium: 9.4 mg/dL (ref 8.7–10.3)
Chloride: 99 mmol/L (ref 96–106)
Creatinine, Ser: 0.93 mg/dL (ref 0.57–1.00)
GFR calc Af Amer: 72 mL/min/{1.73_m2} (ref >59)
GFR calc non Af Amer: 62 mL/min/{1.73_m2} (ref >59)
Glucose: 90 mg/dL (ref 65–99)
POTASSIUM: 4.7 mmol/L (ref 3.5–5.2)
Sodium: 137 mmol/L (ref 134–144)

## 2016-07-27 LAB — CBC WITH DIFFERENTIAL/PLATELET
BASOS: 0 %
Basophils Absolute: 0 10*3/uL (ref 0.0–0.2)
EOS (ABSOLUTE): 0.1 10*3/uL (ref 0.0–0.4)
EOS: 2 %
HEMATOCRIT: 36.4 % (ref 34.0–46.6)
Hemoglobin: 12.4 g/dL (ref 11.1–15.9)
Immature Grans (Abs): 0 10*3/uL (ref 0.0–0.1)
Immature Granulocytes: 0 %
LYMPHS ABS: 1.6 10*3/uL (ref 0.7–3.1)
Lymphs: 29 %
MCH: 29.4 pg (ref 26.6–33.0)
MCHC: 34.1 g/dL (ref 31.5–35.7)
MCV: 86 fL (ref 79–97)
MONOCYTES: 10 %
MONOS ABS: 0.6 10*3/uL (ref 0.1–0.9)
Neutrophils Absolute: 3.1 10*3/uL (ref 1.4–7.0)
Neutrophils: 59 %
PLATELETS: 192 10*3/uL (ref 150–379)
RBC: 4.22 x10E6/uL (ref 3.77–5.28)
RDW: 13.4 % (ref 12.3–15.4)
WBC: 5.4 10*3/uL (ref 3.4–10.8)

## 2016-07-30 ENCOUNTER — Ambulatory Visit (HOSPITAL_COMMUNITY)
Admission: RE | Admit: 2016-07-30 | Discharge: 2016-07-31 | Disposition: A | Discharge status: Home / Self Care | Payer: Medicare Other | Source: Ambulatory Visit | Attending: Internal Medicine | Admitting: Internal Medicine

## 2016-07-30 ENCOUNTER — Ambulatory Visit (HOSPITAL_COMMUNITY)
Admission: RE | Disposition: A | Discharge status: Home / Self Care | Payer: Self-pay | Source: Ambulatory Visit | Attending: Internal Medicine

## 2016-07-30 ENCOUNTER — Encounter (HOSPITAL_COMMUNITY): Payer: Self-pay | Admitting: Internal Medicine

## 2016-07-30 DIAGNOSIS — E785 Hyperlipidemia, unspecified: Secondary | ICD-10-CM | POA: Diagnosis not present

## 2016-07-30 DIAGNOSIS — I2510 Atherosclerotic heart disease of native coronary artery without angina pectoris: Secondary | ICD-10-CM | POA: Diagnosis not present

## 2016-07-30 DIAGNOSIS — Z7982 Long term (current) use of aspirin: Secondary | ICD-10-CM | POA: Diagnosis not present

## 2016-07-30 DIAGNOSIS — E039 Hypothyroidism, unspecified: Secondary | ICD-10-CM | POA: Diagnosis not present

## 2016-07-30 DIAGNOSIS — Z8249 Family history of ischemic heart disease and other diseases of the circulatory system: Secondary | ICD-10-CM | POA: Insufficient documentation

## 2016-07-30 DIAGNOSIS — I10 Essential (primary) hypertension: Secondary | ICD-10-CM | POA: Diagnosis not present

## 2016-07-30 DIAGNOSIS — Z882 Allergy status to sulfonamides status: Secondary | ICD-10-CM | POA: Insufficient documentation

## 2016-07-30 DIAGNOSIS — I495 Sick sinus syndrome: Secondary | ICD-10-CM | POA: Diagnosis not present

## 2016-07-30 DIAGNOSIS — Z950 Presence of cardiac pacemaker: Secondary | ICD-10-CM

## 2016-07-30 DIAGNOSIS — J45909 Unspecified asthma, uncomplicated: Secondary | ICD-10-CM | POA: Diagnosis not present

## 2016-07-30 HISTORY — PX: PACEMAKER IMPLANT: EP1218

## 2016-07-30 LAB — SURGICAL PCR SCREEN
MRSA, PCR: NEGATIVE
STAPHYLOCOCCUS AUREUS: NEGATIVE

## 2016-07-30 SURGERY — PACEMAKER IMPLANT

## 2016-07-30 MED ORDER — CHLORTHALIDONE 25 MG PO TABS
25.0000 mg | ORAL_TABLET | Freq: Every day | ORAL | Status: DC
  Administered 2016-07-30 – 2016-07-31 (×2): 25 mg via ORAL
  Filled 2016-07-30 (×2): qty 1

## 2016-07-30 MED ORDER — LEVOTHYROXINE SODIUM 50 MCG PO TABS
50.0000 ug | ORAL_TABLET | Freq: Every day | ORAL | Status: DC
  Administered 2016-07-31: 50 ug via ORAL
  Filled 2016-07-30: qty 1

## 2016-07-30 MED ORDER — SIMVASTATIN 20 MG PO TABS
20.0000 mg | ORAL_TABLET | Freq: Every day | ORAL | Status: DC
  Administered 2016-07-30: 20 mg via ORAL
  Filled 2016-07-30: qty 1

## 2016-07-30 MED ORDER — ASPIRIN EC 81 MG PO TBEC
81.0000 mg | DELAYED_RELEASE_TABLET | Freq: Every day | ORAL | Status: DC
  Administered 2016-07-30 – 2016-07-31 (×2): 81 mg via ORAL
  Filled 2016-07-30 (×2): qty 1

## 2016-07-30 MED ORDER — FENTANYL CITRATE (PF) 100 MCG/2ML IJ SOLN
INTRAMUSCULAR | Status: DC | PRN
  Administered 2016-07-30 (×3): 12.5 ug via INTRAVENOUS

## 2016-07-30 MED ORDER — HEPARIN (PORCINE) IN NACL 2-0.9 UNIT/ML-% IJ SOLN
INTRAMUSCULAR | Status: AC
  Filled 2016-07-30: qty 500

## 2016-07-30 MED ORDER — SODIUM CHLORIDE 0.9 % IR SOLN: 80.0000 mg | Status: DC

## 2016-07-30 MED ORDER — BIOTIN 5 MG PO CAPS: 5.0000 mg | ORAL_CAPSULE | Freq: Every day | ORAL | Status: DC

## 2016-07-30 MED ORDER — CEFAZOLIN SODIUM-DEXTROSE 2-4 GM/100ML-% IV SOLN
2.0000 g | INTRAVENOUS | Status: AC
  Administered 2016-07-30: 2 g via INTRAVENOUS

## 2016-07-30 MED ORDER — CHLORHEXIDINE GLUCONATE 4 % EX LIQD: 60.0000 mL | Freq: Once | CUTANEOUS | Status: DC

## 2016-07-30 MED ORDER — CEFAZOLIN SODIUM-DEXTROSE 1-4 GM/50ML-% IV SOLN
1.0000 g | Freq: Four times a day (QID) | INTRAVENOUS | Status: AC
  Administered 2016-07-30 – 2016-07-31 (×3): 1 g via INTRAVENOUS
  Filled 2016-07-30 (×3): qty 50

## 2016-07-30 MED ORDER — MUPIROCIN 2 % EX OINT
TOPICAL_OINTMENT | CUTANEOUS | Status: AC
  Administered 2016-07-30: 06:00:00
  Filled 2016-07-30: qty 22

## 2016-07-30 MED ORDER — ACETAMINOPHEN 325 MG PO TABS
325.0000 mg | ORAL_TABLET | ORAL | Status: DC | PRN
  Administered 2016-07-30: 650 mg via ORAL
  Administered 2016-07-30: 325 mg via ORAL
  Filled 2016-07-30 (×2): qty 2

## 2016-07-30 MED ORDER — VITAMIN D 1000 UNITS PO TABS
2000.0000 [IU] | ORAL_TABLET | Freq: Every day | ORAL | Status: DC
  Administered 2016-07-30: 2000 [IU] via ORAL
  Filled 2016-07-30: qty 2

## 2016-07-30 MED ORDER — CLOBETASOL PROPIONATE 0.05 % EX CREA: 1.0000 "application " | TOPICAL_CREAM | Freq: Every day | CUTANEOUS | Status: DC | PRN

## 2016-07-30 MED ORDER — LISINOPRIL 2.5 MG PO TABS
2.5000 mg | ORAL_TABLET | Freq: Every day | ORAL | Status: DC
  Administered 2016-07-30 – 2016-07-31 (×2): 2.5 mg via ORAL
  Filled 2016-07-30 (×2): qty 1

## 2016-07-30 MED ORDER — CEFAZOLIN SODIUM-DEXTROSE 2-4 GM/100ML-% IV SOLN
INTRAVENOUS | Status: AC
  Filled 2016-07-30: qty 100

## 2016-07-30 MED ORDER — LIDOCAINE HCL (PF) 1 % IJ SOLN
INTRAMUSCULAR | Status: AC
  Filled 2016-07-30: qty 60

## 2016-07-30 MED ORDER — FENTANYL CITRATE (PF) 100 MCG/2ML IJ SOLN
INTRAMUSCULAR | Status: AC
  Filled 2016-07-30: qty 2

## 2016-07-30 MED ORDER — ADULT MULTIVITAMIN W/MINERALS CH
1.0000 | ORAL_TABLET | Freq: Every day | ORAL | Status: DC
  Administered 2016-07-30: 1 via ORAL
  Filled 2016-07-30: qty 1

## 2016-07-30 MED ORDER — ACETAMINOPHEN ER 650 MG PO TBCR: 650.0000 mg | EXTENDED_RELEASE_TABLET | Freq: Every day | ORAL | Status: DC | PRN

## 2016-07-30 MED ORDER — SODIUM CHLORIDE 0.9 % IV SOLN
INTRAVENOUS | Status: DC
  Administered 2016-07-30: 06:00:00 via INTRAVENOUS

## 2016-07-30 MED ORDER — MIDAZOLAM HCL 5 MG/5ML IJ SOLN
INTRAMUSCULAR | Status: AC
  Filled 2016-07-30: qty 5

## 2016-07-30 MED ORDER — CEFAZOLIN SODIUM-DEXTROSE 2-3 GM-% IV SOLR
INTRAVENOUS | Status: AC | PRN
  Administered 2016-07-30: 2 g via INTRAVENOUS

## 2016-07-30 MED ORDER — SODIUM CHLORIDE 0.9 % IR SOLN
Status: AC
  Filled 2016-07-30: qty 2

## 2016-07-30 MED ORDER — MIDAZOLAM HCL 5 MG/5ML IJ SOLN
INTRAMUSCULAR | Status: DC | PRN
  Administered 2016-07-30: 1 mg via INTRAVENOUS
  Administered 2016-07-30: 2 mg via INTRAVENOUS
  Administered 2016-07-30: 1 mg via INTRAVENOUS

## 2016-07-30 MED ORDER — ONDANSETRON HCL 4 MG/2ML IJ SOLN: 4.0000 mg | Freq: Four times a day (QID) | INTRAMUSCULAR | Status: DC | PRN

## 2016-07-30 SURGICAL SUPPLY — 7 items
CABLE SURGICAL S-101-97-12 (CABLE) ×2 IMPLANT
LEAD SOLIA S PRO MRI 45 (Lead) ×2 IMPLANT
LEAD SOLIA S PRO MRI 53 (Lead) ×2 IMPLANT
PACEMAKER EDORA 8DR-T MRI (Pacemaker) ×2 IMPLANT
PAD DEFIB LIFELINK (PAD) ×2 IMPLANT
SHEATH CLASSIC 7F (SHEATH) ×4 IMPLANT
TRAY PACEMAKER INSERTION (PACKS) ×2 IMPLANT

## 2016-07-30 NOTE — Interval H&P Note (Signed)
History and Physical Interval Note:  07/30/2016 6:58 AM  Amy [name]  has presented today for surgery, with the diagnosis of tachibradia  The various methods of treatment have been discussed with the patient and family. After consideration of risks, benefits and other options for treatment, the patient has consented to  Procedure(s): Pacemaker Implant Dual Chamber (N/A) as a surgical intervention .  The patient's history has been reviewed, patient examined, no change in status, stable for surgery.  I have reviewed the patient's chart and labs.  Questions were answered to the patient's satisfaction.     Cristopher Peru

## 2016-07-30 NOTE — Progress Notes (Signed)
Orthopedic Tech Progress Note Patient Details:  Amy [name] [birth year] [id number]  Ortho Devices Type of Ortho Device: Arm sling   [place] Pink 07/30/2016, 12:13 PM

## 2016-07-30 NOTE — H&P (View-Only) (Signed)
HPI Amy [name] is referred today by Dr. Domenic Polite for evaluation of sinus bradycardia. The patient has CAD and underwent stenting of her coronary arteries (LAD/DX) in 2015. She has been on effient since. She notes that she has been feeling tired for almost 6 months. The patient has not had syncope. She wore a cardiac monitor which revealed daytime bradycardia in the 30's with pauses while awake of 3 seconds. She has both intermittent dizziness and fatigue. Labs have been negative.  Allergies  Allergen Reactions  . Sulfa Antibiotics Rash     Current Outpatient Prescriptions  Medication Sig Dispense Refill  . aspirin 81 MG tablet Take 81 mg by mouth daily.    . Biotin (BIOTIN 5000) 5 MG CAPS Take by mouth.    . chlorthalidone (HYGROTON) 25 MG tablet Take 1 tablet (25 mg total) by mouth daily. 30 tablet 1  . Cholecalciferol (VITAMIN D3) 2000 units capsule Take 2,000 Units by mouth daily.    . clobetasol (TEMOVATE) 0.05 % external solution Apply 1 application topically 2 (two) times daily.    Marland Kitchen levothyroxine (SYNTHROID, LEVOTHROID) 50 MCG tablet Take 50 mcg by mouth daily before breakfast.    . lisinopril (PRINIVIL,ZESTRIL) 5 MG tablet Take 0.5 tablets (2.5 mg total) by mouth daily. 45 tablet 3  . Multiple Vitamin (MULTIVITAMIN WITH MINERALS) TABS tablet Take 1 tablet by mouth daily.    . prasugrel (EFFIENT) 10 MG TABS tablet Take 0.5 tablets (5 mg total) by mouth daily. 45 tablet 3  . simvastatin (ZOCOR) 20 MG tablet Take 20 mg by mouth daily.     No current facility-administered medications for this visit.      Past Medical History:  Diagnosis Date  . Asthma   . CAD (coronary artery disease)    60-70% proximal LAD/diagonal bifurcational disease October 2015 at [facility] status DES x 2 to LAD/diagonal October 2015  . Essential hypertension   . Hyperlipidemia   . Hypothyroidism     ROS:   All systems reviewed and negative except as noted in the HPI.   Past Surgical  History:  Procedure Laterality Date  . ABDOMINAL HYSTERECTOMY    . ANAL FISSURE REPAIR    . CATARACT EXTRACTION W/PHACO Right 10/17/2014   Procedure: CATARACT EXTRACTION PHACO AND INTRAOCULAR LENS PLACEMENT ([place]);  Surgeon: Tonny Branch, MD;  Location: [facility];  Service: Ophthalmology;  Laterality: Right;  CDE: 4.88  . CATARACT EXTRACTION W/PHACO Left 10/27/2014   Procedure: CATARACT EXTRACTION PHACO AND INTRAOCULAR LENS PLACEMENT ([facility]);  Surgeon: Tonny Branch, MD;  Location: [facility];  Service: Ophthalmology;  Laterality: Left;  CDE:7.86  . FINGER SURGERY    . NASAL SINUS SURGERY    . TONSILLECTOMY       Family History  Problem Relation Age of Onset  . Heart disease Father      Social History   Social History  . Marital status: Widowed    Spouse name: N/A  . Number of children: N/A  . Years of education: N/A   Occupational History  . Not on file.   Social History Main Topics  . Smoking status: Never Smoker  . Smokeless tobacco: Never Used  . Alcohol use No  . Drug use: No  . Sexual activity: No   Other Topics Concern  . Not on file   Social History Narrative  . No narrative on file     BP (!) 142/50   Pulse (!) 53   Ht 5' (1.524  m)   Wt 131 lb (59.4 kg)   SpO2 100%   BMI 25.58 kg/m   Physical Exam:  Well appearing 71 yo woman, NAD HEENT: Unremarkable Neck:  6 cm JVD, no thyromegally Lymphatics:  No adenopathy Back:  No CVA tenderness Lungs:  Clear with no wheezes HEART:  Regular rate rhythm, no murmurs, no rubs, no clicks Abd:  soft, positive bowel sounds, no organomegally, no rebound, no guarding Ext:  2 plus pulses, no edema, no cyanosis, no clubbing Skin:  No rashes no nodules Neuro:  CN II through XII intact, motor grossly intact  EKG - sinus bradycardia  48 hour holter - sinus bradycardia, sinus tachycardia and NSR with pauses of 3 seconds.  Assess/Plan: 1. Symptomatic sinus node dysfunction - I have discussed the  indications/risks/benefits/goals/expectations of PPM insertion and she wishes to proceed. I am not convinced that all of her symptoms will be improved by her PPM but I think she has enough bradycardia and sinus node dysfunction for the device to be placed. 2. CAD - she is s/p PCI over 3 years ago. She will hold her effient for 2 days before the procedure. I am not sure that she will need additional effient. 3. HTN - her blood pressure is a bit elevated today. Would consider adding a calcium channel blocker once her PPM is in place. 4. Palpitations - the etiology is unclear. She did not have atrial fib on her heart monitor but she is at risk for atrial fib and we will be able to diagnose this if she has atrial fib after her PPM is inserted.  Mikle Bosworth.D.

## 2016-07-31 ENCOUNTER — Ambulatory Visit (HOSPITAL_COMMUNITY): Payer: Medicare Other

## 2016-07-31 ENCOUNTER — Encounter (HOSPITAL_COMMUNITY): Payer: Self-pay | Admitting: *Deleted

## 2016-07-31 DIAGNOSIS — E039 Hypothyroidism, unspecified: Secondary | ICD-10-CM | POA: Diagnosis not present

## 2016-07-31 DIAGNOSIS — I495 Sick sinus syndrome: Secondary | ICD-10-CM

## 2016-07-31 DIAGNOSIS — I10 Essential (primary) hypertension: Secondary | ICD-10-CM | POA: Diagnosis not present

## 2016-07-31 DIAGNOSIS — I2510 Atherosclerotic heart disease of native coronary artery without angina pectoris: Secondary | ICD-10-CM | POA: Diagnosis not present

## 2016-07-31 DIAGNOSIS — Z882 Allergy status to sulfonamides status: Secondary | ICD-10-CM | POA: Diagnosis not present

## 2016-07-31 DIAGNOSIS — J45909 Unspecified asthma, uncomplicated: Secondary | ICD-10-CM | POA: Diagnosis not present

## 2016-07-31 DIAGNOSIS — Z8249 Family history of ischemic heart disease and other diseases of the circulatory system: Secondary | ICD-10-CM | POA: Diagnosis not present

## 2016-07-31 DIAGNOSIS — E785 Hyperlipidemia, unspecified: Secondary | ICD-10-CM | POA: Diagnosis not present

## 2016-07-31 DIAGNOSIS — Z7982 Long term (current) use of aspirin: Secondary | ICD-10-CM | POA: Diagnosis not present

## 2016-07-31 DIAGNOSIS — Z950 Presence of cardiac pacemaker: Secondary | ICD-10-CM | POA: Diagnosis not present

## 2016-07-31 MED FILL — Sodium Chloride Irrigation Soln 0.9%: Qty: 500 | Status: AC

## 2016-07-31 MED FILL — Heparin Sodium (Porcine) 2 Unit/ML in Sodium Chloride 0.9%: INTRAMUSCULAR | Qty: 500 | Status: AC

## 2016-07-31 MED FILL — Gentamicin Sulfate Inj 40 MG/ML: INTRAMUSCULAR | Qty: 2 | Status: AC

## 2016-07-31 MED FILL — Lidocaine HCl Local Preservative Free (PF) Inj 1%: INTRAMUSCULAR | Qty: 30 | Status: AC

## 2016-07-31 NOTE — Discharge Instructions (Signed)
° ° °  Supplemental Discharge Instructions for  Pacemaker/Defibrillator Patients  Activity No heavy lifting or vigorous activity with your left/right arm for 6 to 8 weeks.  Do not raise your left/right arm above your head for one week.  Gradually raise your affected arm as drawn below.             08/02/16                     08/03/16                    08/04/16                    08/05/16 __  NO DRIVING for 1 week    ; you may begin driving on  09/07/64  .  WOUND CARE - Keep the wound area clean and dry.  Do not get this area wet for one week. No showers for one week; you may shower on  08/05/16   . - The tape/steri-strips on your wound will fall off; do not pull them off.  No bandage is needed on the site.  DO  NOT apply any creams, oils, or ointments to the wound area. - If you notice any drainage or discharge from the wound, any swelling or bruising at the site, or you develop a fever > 101? F after you are discharged home, call the office at once.  Special Instructions - You are still able to use cellular telephones; use the ear opposite the side where you have your pacemaker/defibrillator.  Avoid carrying your cellular phone near your device. - When traveling through airports, show security personnel your identification card to avoid being screened in the metal detectors.  Ask the security personnel to use the hand wand. - Avoid arc welding equipment, MRI testing (magnetic resonance imaging), TENS units (transcutaneous nerve stimulators).  Call the office for questions about other devices. - Avoid electrical appliances that are in poor condition or are not properly grounded. - Microwave ovens are safe to be near or to operate.  Additional information for defibrillator patients should your device go off: - If your device goes off ONCE and you feel fine afterward, notify the device clinic nurses. - If your device goes off ONCE and you do not feel well afterward, call 911. - If your device goes  off TWICE, call 911. - If your device goes off THREE times in one day, call 911.  DO NOT DRIVE YOURSELF OR A FAMILY MEMBER WITH A DEFIBRILLATOR TO THE HOSPITAL--CALL 911.

## 2016-07-31 NOTE — Discharge Summary (Signed)
ELECTROPHYSIOLOGY PROCEDURE DISCHARGE SUMMARY    Patient ID: Amy [name],  MRN: [id number], DOB/AGE: 02-08-1945 71 y.o.  Admit date: 07/30/2016 Discharge date: 07/31/2016  Primary Care Physician: Patient, No Pcp Per Primary Cardiologist: Dr. Domenic Polite Electrophysiologist: Dr. Lovena Le  Primary Discharge Diagnosis:  Symptomatic bradycardia status post pacemaker implantation this admission  Secondary Discharge Diagnosis:  1. CAD, last intervention 2015 2. HLD 3. Hypothyroidism  Allergies  Allergen Reactions  . Sulfa Antibiotics Rash     Procedures This Admission:  1.  Implantation of a Biotronik dual chamber PPM on 07/30/16 by Dr Lovena Le.  The patient received a Biotronik (serial number [id number]) pacemaker Biotronik (serial number [id number]) right atrial lead and a Biotronik (serial number [id number]) right ventricular lead  There were no immediate post procedure complications. 2.  CXR on 07/31/16 demonstrated no pneumothorax status post device implantation.   Brief HPI:  Amy [name] is a 71 y.o. female was referred to electrophysiology in the outpatient setting for consideration of PPM implantation.  Past medical history includes .  The patient has had symptomatic bradycardia without reversible causes identified.  Risks, benefits, and alternatives to PPM implantation were reviewed with the patient who wished to proceed.   Hospital Course:  The patient was admitted and underwent implantation of a PPM with details as outlined above.  She was monitored on telemetry overnight which demonstrated A pacing/V sensing.  Left chest was without hematoma or ecchymosis.  The device was interrogated and found to be functioning normally.  CXR was obtained and demonstrated no pneumothorax status post device implantation.  Wound care, arm mobility, and restrictions were reviewed with the patient.  The patient was by Dr. Lovena Le examined and considered stable for discharge to home.    Physical  Exam: Vitals:   07/30/16 2027 07/31/16 0017 07/31/16 0624 07/31/16 0850  BP: (!) 150/43 (!) 159/58 (!) 178/64 (!) 130/37  Pulse: 64 66 66 68  Resp: 18 18 18    Temp: 97.8 F (36.6 C) 97.6 F (36.4 C) 97.4 F (36.3 C) 97.7 F (36.5 C)  TempSrc: Oral Oral Oral Oral  SpO2: 98% 97% 99% 99%  Weight:   129 lb 12.8 oz (58.9 kg)   Height:        GEN- The patient is well appearing, alert and oriented x 3 today.   HEENT: normocephalic, atraumatic; sclera clear, conjunctiva pink; hearing intact; oropharynx clear; neck supple, no JVP Lungs- CTA b/l, normal work of breathing.  No wheezes, rales, rhonchi Heart- RRR, no murmurs, rubs or gallops, PMI not laterally displaced GI- soft, non-tender, non-distended Extremities- no clubbing, cyanosis, or edema MS- no significant deformity or atrophy Skin- warm and dry, no rash or lesion, left chest without hematoma, minimal ecchymosis Psych- euthymic mood, full affect Neuro- no gross deficits   Labs:   Lab Results  Component Value Date   WBC 5.4 07/26/2016   HGB 12.4 07/26/2016   HCT 36.4 07/26/2016   MCV 86 07/26/2016   PLT 192 07/26/2016     Recent Labs Lab 07/26/16 0908  NA 137  K 4.7  CL 99  CO2 24  BUN 19  CREATININE 0.93  CALCIUM 9.4  GLUCOSE 90    Discharge Medications:  Allergies as of 07/31/2016      Reactions   Sulfa Antibiotics Rash      Medication List    TAKE these medications   acetaminophen 650 MG CR tablet Commonly known as:  TYLENOL Take 650 mg by mouth daily  as needed for pain.   aspirin 81 MG tablet Take 81 mg by mouth daily.   BIOTIN 5000 5 MG Caps Generic drug:  Biotin Take 5 mg by mouth at bedtime.   chlorthalidone 25 MG tablet Commonly known as:  HYGROTON Take 1 tablet (25 mg total) by mouth daily.   clobetasol 0.05 % external solution Commonly known as:  TEMOVATE Apply 1 application topically daily as needed (psoriasis).   levothyroxine 50 MCG tablet Commonly known as:  SYNTHROID,  LEVOTHROID Take 50 mcg by mouth daily before breakfast.   lisinopril 5 MG tablet Commonly known as:  PRINIVIL,ZESTRIL Take 0.5 tablets (2.5 mg total) by mouth daily. What changed:  when to take this   multivitamin with minerals Tabs tablet Take 1 tablet by mouth at bedtime.   prasugrel 10 MG Tabs tablet Commonly known as:  EFFIENT Take 0.5 tablets (5 mg total) by mouth daily.   simvastatin 20 MG tablet Commonly known as:  ZOCOR Take 20 mg by mouth at bedtime.   Vitamin D3 2000 units capsule Take 2,000 Units by mouth at bedtime.       Disposition:   Follow-up Information    [place] Office Follow up on 08/14/2016.   Specialty:  Cardiology Why:  2;00PM, wound check Contact information: [address], [address] [place]       Evans Lance, MD Follow up on 10/29/2016.   Specialty:  Cardiology Why:  11:30AM Contact information: [address] [address] [phone]           Duration of Discharge Encounter: Greater than 30 minutes including physician time.  Venetia Night, PA-C 07/31/2016 11:22 AM  EP Attending  Patient seen and examined. Agree with above. The patient is doing well after PPM insertion. PM interogation under my direction demonstrates normal DDD PM function. She will be discharged home with usual followup.  Mikle Bosworth.D.

## 2016-07-31 NOTE — Progress Notes (Signed)
Pt has orders to be discharged. Discharge instructions given and pt has no additional questions at this time. Medication regimen reviewed and pt educated. Pt verbalized understanding and has no additional questions. Telemetry box removed. IV removed and site in good condition. Pt stable and waiting for transportation.  Dimonique Bourdeau RN 

## 2016-08-05 ENCOUNTER — Telehealth: Payer: Self-pay | Admitting: Cardiology

## 2016-08-05 NOTE — Telephone Encounter (Signed)
Pt called and stated that she answered her cell phone and put it to her left ear and she wants to know if she hurt her PPM in anyway by doing this? Pt also wants to know if she can take any medicines / antibiotics for her bronchitis? Informed pt that I would send note to [place] and someone will call her back as soon as they can. Pt verbalized understanding.

## 2016-08-05 NOTE — Telephone Encounter (Signed)
Informed patient that she would not harm her ppm by answering her cell phone on the L side. I told her that it is recommended that she try to use it on her R side if possible. Patient verbalized understanding. I also told her that she can take antibiotics post ppm implant. Patient states that she's been coughing and wasn't sure if it was d/t her ppm. She says that she's experienced the same cough and pain in her throat prior to ppm implant, and therefore doesn't believe it is related. I told her that being that she's experienced this before it sounds like it is probably unrelated. I told her that she should contact her PCP about her sx's and see what is recommended. Again, patient verbalized understanding and appreciation of information.

## 2016-08-12 DIAGNOSIS — D485 Neoplasm of uncertain behavior of skin: Secondary | ICD-10-CM | POA: Diagnosis not present

## 2016-08-12 DIAGNOSIS — L814 Other melanin hyperpigmentation: Secondary | ICD-10-CM | POA: Diagnosis not present

## 2016-08-12 DIAGNOSIS — L400 Psoriasis vulgaris: Secondary | ICD-10-CM | POA: Diagnosis not present

## 2016-08-12 DIAGNOSIS — L821 Other seborrheic keratosis: Secondary | ICD-10-CM | POA: Diagnosis not present

## 2016-08-14 ENCOUNTER — Ambulatory Visit (INDEPENDENT_AMBULATORY_CARE_PROVIDER_SITE_OTHER): Payer: Medicare Other | Admitting: *Deleted

## 2016-08-14 DIAGNOSIS — I495 Sick sinus syndrome: Secondary | ICD-10-CM | POA: Diagnosis not present

## 2016-08-14 LAB — CUP PACEART INCLINIC DEVICE CHECK
Brady Statistic AP VS Percent: 67 %
Brady Statistic AS VP Percent: 0 %
Date Time Interrogation Session: 20180711144808
Implantable Lead Implant Date: 20180626
Implantable Lead Location: 753860
Implantable Lead Serial Number: 49791993
Implantable Pulse Generator Implant Date: 20180626
Lead Channel Impedance Value: 487 Ohm
Lead Channel Impedance Value: 546 Ohm
Lead Channel Pacing Threshold Amplitude: 0.6 V
Lead Channel Pacing Threshold Pulse Width: 0.4 ms
Lead Channel Pacing Threshold Pulse Width: 0.4 ms
Lead Channel Sensing Intrinsic Amplitude: 4 mV
Lead Channel Setting Pacing Amplitude: 3 V
MDC IDC LEAD IMPLANT DT: 20180626
MDC IDC LEAD LOCATION: 753859
MDC IDC LEAD SERIAL: 49865965
MDC IDC MSMT LEADCHNL RA PACING THRESHOLD AMPLITUDE: 0.6 V
MDC IDC MSMT LEADCHNL RV SENSING INTR AMPL: 13.4 mV
MDC IDC SET LEADCHNL RV PACING AMPLITUDE: 3 V
MDC IDC SET LEADCHNL RV PACING PULSEWIDTH: 0.4 ms
MDC IDC STAT BRADY AP VP PERCENT: 0 %
MDC IDC STAT BRADY AS VS PERCENT: 33 %
Pulse Gen Model: 407145
Pulse Gen Serial Number: 69052081

## 2016-08-14 NOTE — Progress Notes (Signed)
Wound check appointment. Steri-strips removed. Wound without redness or edema. Incision edges approximated, wound well healed. Normal device function. Thresholds, sensing, and impedances consistent with implant measurements. Device programmed at 3.5V for extra safety margin until 3 month visit. Histogram distribution appropriate for patient and level of activity. No mode switches or high ventricular rates noted. Patient educated about wound care, arm mobility, lifting restrictions. ROV with GT 10/29/16.

## 2016-09-13 ENCOUNTER — Other Ambulatory Visit: Payer: Self-pay | Admitting: Cardiology

## 2016-09-13 MED ORDER — LISINOPRIL 5 MG PO TABS: 2.5000 mg | ORAL_TABLET | Freq: Every day | ORAL | 3 refills | Status: DC

## 2016-09-13 NOTE — Telephone Encounter (Signed)
Needs refill on lisinopril (PRINIVIL,ZESTRIL) 5 MG tablet [[id number]]  [place], [place]

## 2016-09-13 NOTE — Telephone Encounter (Signed)
Prescription sent to pharmacy.

## 2016-10-01 DIAGNOSIS — I10 Essential (primary) hypertension: Secondary | ICD-10-CM | POA: Diagnosis not present

## 2016-10-01 DIAGNOSIS — E782 Mixed hyperlipidemia: Secondary | ICD-10-CM | POA: Diagnosis not present

## 2016-10-01 DIAGNOSIS — R001 Bradycardia, unspecified: Secondary | ICD-10-CM | POA: Diagnosis not present

## 2016-10-01 DIAGNOSIS — Z950 Presence of cardiac pacemaker: Secondary | ICD-10-CM | POA: Diagnosis not present

## 2016-10-01 DIAGNOSIS — Z955 Presence of coronary angioplasty implant and graft: Secondary | ICD-10-CM | POA: Diagnosis not present

## 2016-10-01 DIAGNOSIS — I2510 Atherosclerotic heart disease of native coronary artery without angina pectoris: Secondary | ICD-10-CM | POA: Diagnosis not present

## 2016-10-01 DIAGNOSIS — Z23 Encounter for immunization: Secondary | ICD-10-CM | POA: Diagnosis not present

## 2016-10-01 DIAGNOSIS — Z6826 Body mass index (BMI) 26.0-26.9, adult: Secondary | ICD-10-CM | POA: Diagnosis not present

## 2016-10-03 DIAGNOSIS — Z23 Encounter for immunization: Secondary | ICD-10-CM | POA: Diagnosis not present

## 2016-10-15 ENCOUNTER — Other Ambulatory Visit: Payer: Self-pay | Admitting: Cardiology

## 2016-10-18 ENCOUNTER — Encounter: Payer: Self-pay | Admitting: Internal Medicine

## 2016-10-29 ENCOUNTER — Ambulatory Visit (INDEPENDENT_AMBULATORY_CARE_PROVIDER_SITE_OTHER): Payer: Medicare Other | Admitting: Internal Medicine

## 2016-10-29 ENCOUNTER — Encounter: Payer: Self-pay | Admitting: Internal Medicine

## 2016-10-29 VITALS — BP 158/78 | HR 60 | Ht 60.0 in | Wt 134.8 lb

## 2016-10-29 DIAGNOSIS — I495 Sick sinus syndrome: Secondary | ICD-10-CM

## 2016-10-29 DIAGNOSIS — I25810 Atherosclerosis of coronary artery bypass graft(s) without angina pectoris: Secondary | ICD-10-CM

## 2016-10-29 DIAGNOSIS — R001 Bradycardia, unspecified: Secondary | ICD-10-CM

## 2016-10-29 DIAGNOSIS — I10 Essential (primary) hypertension: Secondary | ICD-10-CM | POA: Diagnosis not present

## 2016-10-29 LAB — CUP PACEART INCLINIC DEVICE CHECK
Implantable Lead Model: 377
Implantable Lead Serial Number: 49865965
Implantable Pulse Generator Implant Date: 20180626
Lead Channel Impedance Value: 546 Ohm
Lead Channel Impedance Value: 643 Ohm
Lead Channel Pacing Threshold Amplitude: 0.6 V
Lead Channel Pacing Threshold Pulse Width: 0.4 ms
Lead Channel Pacing Threshold Pulse Width: 0.4 ms
Lead Channel Sensing Intrinsic Amplitude: 15 mV
Lead Channel Sensing Intrinsic Amplitude: 15.7 mV
Lead Channel Sensing Intrinsic Amplitude: 6.9 mV
Lead Channel Setting Pacing Amplitude: 2 V
Lead Channel Setting Pacing Amplitude: 2.4 V
Lead Channel Setting Pacing Pulse Width: 0.4 ms
MDC IDC LEAD IMPLANT DT: 20180626
MDC IDC LEAD IMPLANT DT: 20180626
MDC IDC LEAD LOCATION: 753859
MDC IDC LEAD LOCATION: 753860
MDC IDC LEAD SERIAL: 49791993
MDC IDC MSMT LEADCHNL RA PACING THRESHOLD AMPLITUDE: 0.6 V
MDC IDC MSMT LEADCHNL RA PACING THRESHOLD AMPLITUDE: 0.7 V
MDC IDC MSMT LEADCHNL RA PACING THRESHOLD PULSEWIDTH: 0.4 ms
MDC IDC MSMT LEADCHNL RA PACING THRESHOLD PULSEWIDTH: 0.4 ms
MDC IDC MSMT LEADCHNL RA SENSING INTR AMPL: 6.6 mV
MDC IDC MSMT LEADCHNL RV PACING THRESHOLD AMPLITUDE: 0.6 V
MDC IDC MSMT LEADCHNL RV PACING THRESHOLD AMPLITUDE: 0.7 V
MDC IDC MSMT LEADCHNL RV PACING THRESHOLD AMPLITUDE: 0.7 V
MDC IDC MSMT LEADCHNL RV PACING THRESHOLD PULSEWIDTH: 0.4 ms
MDC IDC MSMT LEADCHNL RV PACING THRESHOLD PULSEWIDTH: 0.4 ms
MDC IDC SESS DTM: 20180925120154
MDC IDC STAT BRADY RA PERCENT PACED: 70 %
MDC IDC STAT BRADY RV PERCENT PACED: 0 %
Pulse Gen Model: 407145
Pulse Gen Serial Number: 69052081

## 2016-10-29 NOTE — Progress Notes (Signed)
HPI Mrs. Trillo returns today for ongoing evaluation and management of sinus node dysfunction status post dual-chamber pacemaker insertion 3 months ago. In the interim, the patient has done well. She has not been in the hospital. She denies chest pain or shortness of breath. She does admit to some dietary indiscretion and does continue to have hypertension. Allergies  Allergen Reactions  . Sulfa Antibiotics Rash     Current Outpatient Prescriptions  Medication Sig Dispense Refill  . acetaminophen (TYLENOL) 650 MG CR tablet Take 650 mg by mouth daily as needed for pain.    Marland Kitchen aspirin 81 MG tablet Take 81 mg by mouth daily.    . Biotin (BIOTIN 5000) 5 MG CAPS Take 5 mg by mouth at bedtime.     . chlorthalidone (HYGROTON) 25 MG tablet TAKE ONE TABLET BY MOUTH ONCE DAILY 30 tablet 3  . Cholecalciferol (VITAMIN D3) 2000 units capsule Take 2,000 Units by mouth at bedtime.     . clobetasol (TEMOVATE) 0.05 % external solution Apply 1 application topically daily as needed (psoriasis).     Marland Kitchen levothyroxine (SYNTHROID, LEVOTHROID) 50 MCG tablet Take 50 mcg by mouth daily before breakfast.    . lisinopril (PRINIVIL,ZESTRIL) 5 MG tablet Take 0.5 tablets (2.5 mg total) by mouth daily. 45 tablet 3  . Multiple Vitamin (MULTIVITAMIN WITH MINERALS) TABS tablet Take 1 tablet by mouth at bedtime.     . prasugrel (EFFIENT) 10 MG TABS tablet Take 0.5 tablets (5 mg total) by mouth daily. 45 tablet 3  . simvastatin (ZOCOR) 20 MG tablet Take 20 mg by mouth at bedtime.      No current facility-administered medications for this visit.      Past Medical History:  Diagnosis Date  . Asthma   . CAD (coronary artery disease)    60-70% proximal LAD/diagonal bifurcational disease October 2015 at [facility] status DES x 2 to LAD/diagonal October 2015  . Essential hypertension   . Hyperlipidemia   . Hypothyroidism     ROS:   All systems reviewed and negative except as noted in the HPI.   Past Surgical  History:  Procedure Laterality Date  . ABDOMINAL HYSTERECTOMY    . ANAL FISSURE REPAIR    . CATARACT EXTRACTION W/PHACO Right 10/17/2014   Procedure: CATARACT EXTRACTION PHACO AND INTRAOCULAR LENS PLACEMENT ([place]);  Surgeon: Tonny Branch, MD;  Location: [facility];  Service: Ophthalmology;  Laterality: Right;  CDE: 4.88  . CATARACT EXTRACTION W/PHACO Left 10/27/2014   Procedure: CATARACT EXTRACTION PHACO AND INTRAOCULAR LENS PLACEMENT ([facility]);  Surgeon: Tonny Branch, MD;  Location: [facility];  Service: Ophthalmology;  Laterality: Left;  CDE:7.86  . FINGER SURGERY    . NASAL SINUS SURGERY    . PACEMAKER IMPLANT N/A 07/30/2016   Procedure: Pacemaker Implant Dual Chamber;  Surgeon: Evans Lance, MD;  Location: [place] [facility];  Service: Cardiovascular;  Laterality: N/A;  . TONSILLECTOMY       Family History  Problem Relation Age of Onset  . Heart disease Father      Social History   Social History  . Marital status: Widowed    Spouse name: N/A  . Number of children: N/A  . Years of education: N/A   Occupational History  . Not on file.   Social History Main Topics  . Smoking status: Never Smoker  . Smokeless tobacco: Never Used  . Alcohol use No  . Drug use: No  . Sexual activity: No  Other Topics Concern  . Not on file   Social History Narrative  . No narrative on file     BP (!) 158/78   Pulse 60   Ht 5' (1.524 m)   Wt 134 lb 12.8 oz (61.1 kg)   SpO2 99%   BMI 26.33 kg/m   Physical Exam:  Well appearing 71 year old woman, NAD HEENT: Unremarkable Neck:  6 cm JVD, no thyromegally Lymphatics:  No adenopathy Back:  No CVA tenderness Lungs:  Clear, with no wheezes, rales, or rhonchi. Well-healed pacemaker incision HEART:  Regular rate rhythm, no murmurs, no rubs, no clicks Abd:  soft, positive bowel sounds, no organomegally, no rebound, no guarding Ext:  2 plus pulses, no edema, no cyanosis, no clubbing Skin:  No rashes no nodules Neuro:  CN II through XII intact,  motor grossly intact  EKG - normal sinus rhythm with atrial pacing  DEVICE  Normal device function.  See PaceArt for details.   Assess/Plan: 1. Sinus node dysfunction - she is asymptomatic status post pacemaker insertion 2. Pacemaker - her Biotronik dual-chamber pacemaker is working normally. We'll plan to recheck in several months. 3. Coronary artery disease - she is status post stenting and remains on antiplatelet therapy with both aspirin as well as Prasugrel. Today she has questions regarding the duration of her antiplatelet therapy, and I have asked her to discuss this with Dr. Domenic Polite.  Cristopher Peru, M.D.

## 2016-10-29 NOTE — Patient Instructions (Addendum)
Medication Instructions:  Your physician recommends that you continue on your current medications as directed. Please refer to the Current Medication list given to you today.  Labwork: None ordered.  Testing/Procedures: None ordered.  Follow-Up:  You will need an appointment with Dr. Domenic Polite in November.  Your physician wants you to follow-up in: 9 months with Dr. Lovena Le in [place].   You will receive a reminder letter in the mail two months in advance. If you don't receive a letter, please call our office to schedule the follow-up appointment.  Remote monitoring is used to monitor your Pacemaker from home. This monitoring reduces the number of office visits required to check your device to one time per year. It allows [place] to keep an eye on the functioning of your device to ensure it is working properly. You are scheduled for a device check from home on 01/29/2017. You may send your transmission at any time that day. If you have a wireless device, the transmission will be sent automatically. After your physician reviews your transmission, you will receive a postcard with your next transmission date.    Any Other Special Instructions Will Be Listed Below (If Applicable).     If you need a refill on your cardiac medications before your next appointment, please call your pharmacy.

## 2016-11-11 DIAGNOSIS — L280 Lichen simplex chronicus: Secondary | ICD-10-CM | POA: Diagnosis not present

## 2016-11-11 DIAGNOSIS — L400 Psoriasis vulgaris: Secondary | ICD-10-CM | POA: Diagnosis not present

## 2016-11-12 ENCOUNTER — Other Ambulatory Visit: Payer: Self-pay | Admitting: *Deleted

## 2016-11-12 MED ORDER — SIMVASTATIN 20 MG PO TABS: 20.0000 mg | ORAL_TABLET | Freq: Every day | ORAL | 6 refills | Status: DC

## 2016-12-02 DIAGNOSIS — T148XXA Other injury of unspecified body region, initial encounter: Secondary | ICD-10-CM | POA: Diagnosis not present

## 2016-12-02 DIAGNOSIS — Z6826 Body mass index (BMI) 26.0-26.9, adult: Secondary | ICD-10-CM | POA: Diagnosis not present

## 2016-12-02 DIAGNOSIS — R0781 Pleurodynia: Secondary | ICD-10-CM | POA: Diagnosis not present

## 2016-12-02 DIAGNOSIS — W01119A Fall on same level from slipping, tripping and stumbling with subsequent striking against unspecified sharp object, initial encounter: Secondary | ICD-10-CM | POA: Diagnosis not present

## 2016-12-02 DIAGNOSIS — R58 Hemorrhage, not elsewhere classified: Secondary | ICD-10-CM | POA: Diagnosis not present

## 2016-12-02 DIAGNOSIS — S299XXA Unspecified injury of thorax, initial encounter: Secondary | ICD-10-CM | POA: Diagnosis not present

## 2016-12-19 DIAGNOSIS — E782 Mixed hyperlipidemia: Secondary | ICD-10-CM | POA: Diagnosis not present

## 2016-12-19 DIAGNOSIS — R001 Bradycardia, unspecified: Secondary | ICD-10-CM | POA: Diagnosis not present

## 2016-12-19 DIAGNOSIS — E559 Vitamin D deficiency, unspecified: Secondary | ICD-10-CM | POA: Diagnosis not present

## 2016-12-19 DIAGNOSIS — E039 Hypothyroidism, unspecified: Secondary | ICD-10-CM | POA: Diagnosis not present

## 2016-12-19 DIAGNOSIS — I10 Essential (primary) hypertension: Secondary | ICD-10-CM | POA: Diagnosis not present

## 2016-12-19 DIAGNOSIS — K219 Gastro-esophageal reflux disease without esophagitis: Secondary | ICD-10-CM | POA: Diagnosis not present

## 2016-12-23 DIAGNOSIS — Z6826 Body mass index (BMI) 26.0-26.9, adult: Secondary | ICD-10-CM | POA: Diagnosis not present

## 2016-12-23 DIAGNOSIS — Z950 Presence of cardiac pacemaker: Secondary | ICD-10-CM | POA: Diagnosis not present

## 2016-12-23 DIAGNOSIS — I2510 Atherosclerotic heart disease of native coronary artery without angina pectoris: Secondary | ICD-10-CM | POA: Diagnosis not present

## 2016-12-23 DIAGNOSIS — R001 Bradycardia, unspecified: Secondary | ICD-10-CM | POA: Diagnosis not present

## 2016-12-23 DIAGNOSIS — I10 Essential (primary) hypertension: Secondary | ICD-10-CM | POA: Diagnosis not present

## 2016-12-23 DIAGNOSIS — E782 Mixed hyperlipidemia: Secondary | ICD-10-CM | POA: Diagnosis not present

## 2016-12-23 DIAGNOSIS — Z955 Presence of coronary angioplasty implant and graft: Secondary | ICD-10-CM | POA: Diagnosis not present

## 2016-12-23 DIAGNOSIS — R0781 Pleurodynia: Secondary | ICD-10-CM | POA: Diagnosis not present

## 2016-12-24 NOTE — Progress Notes (Signed)
Cardiology Office Note  Date: 12/25/2016   ID: [name] VILA, DOB 03-Jun-1945, MRN [id number]  PCP: Amy Labrum, MD  Primary Cardiologist: Amy Lesches, MD   Chief Complaint  Patient presents with  . Coronary Artery Disease    History of Present Illness: Amy [name] is a 71 y.o. female last seen in May.  Since that time I referred her to Dr. Lovena Le for evaluation of symptomatic bradycardia.  Cardiac monitoring demonstrated heart rate down into the 30s with pauses of at least 3 seconds.  She ultimately underwent placement of a Biotronik dual-chamber pacemaker on June 26 and had a follow-up visit with Dr. Lovena Le in September.  She presents today for a routine follow-up visit.  She does not report any angina symptoms.  States that episodes of weakness have improved.  She is using her exercise machine, also does regular house chores.  Current cardiac regimen includes aspirin, chlorthalidone, lisinopril, Zocor, and Effient.  Past Medical History:  Diagnosis Date  . Asthma   . CAD (coronary artery disease)    60-70% proximal LAD/diagonal bifurcational disease October 2015 at [facility] status DES x 2 to LAD/diagonal October 2015  . Essential hypertension   . Hyperlipidemia   . Hypothyroidism     Past Surgical History:  Procedure Laterality Date  . ABDOMINAL HYSTERECTOMY    . ANAL FISSURE REPAIR    . CATARACT EXTRACTION W/PHACO Right 10/17/2014   Procedure: CATARACT EXTRACTION PHACO AND INTRAOCULAR LENS PLACEMENT ([place]);  Surgeon: Amy Branch, MD;  Location: [facility];  Service: Ophthalmology;  Laterality: Right;  CDE: 4.88  . CATARACT EXTRACTION W/PHACO Left 10/27/2014   Procedure: CATARACT EXTRACTION PHACO AND INTRAOCULAR LENS PLACEMENT ([facility]);  Surgeon: Amy Branch, MD;  Location: [facility];  Service: Ophthalmology;  Laterality: Left;  CDE:7.86  . FINGER SURGERY    . NASAL SINUS SURGERY    . PACEMAKER IMPLANT N/A 07/30/2016   Procedure: Pacemaker Implant Dual Chamber;  Surgeon:  [name] Lance, MD;  Location: [place] [facility];  Service: Cardiovascular;  Laterality: N/A;  . TONSILLECTOMY      Current Outpatient Medications  Medication Sig Dispense Refill  . acetaminophen (TYLENOL) 650 MG CR tablet Take 650 mg by mouth daily as needed for pain.    Marland Kitchen aspirin 81 MG tablet Take 81 mg by mouth daily.    . Biotin (BIOTIN 5000) 5 MG CAPS Take 5 mg by mouth at bedtime.     . chlorthalidone (HYGROTON) 25 MG tablet TAKE ONE TABLET BY MOUTH ONCE DAILY 30 tablet 3  . Cholecalciferol (VITAMIN D3) 2000 units capsule Take 2,000 Units by mouth at bedtime.     . clobetasol (TEMOVATE) 0.05 % external solution Apply 1 application topically daily as needed (psoriasis).     Marland Kitchen levothyroxine (SYNTHROID, LEVOTHROID) 50 MCG tablet Take 50 mcg by mouth daily before breakfast.    . lisinopril (PRINIVIL,ZESTRIL) 5 MG tablet Take 0.5 tablets (2.5 mg total) by mouth daily. 45 tablet 3  . Multiple Vitamin (MULTIVITAMIN WITH MINERALS) TABS tablet Take 1 tablet by mouth at bedtime.     . prasugrel (EFFIENT) 10 MG TABS tablet Take 0.5 tablets (5 mg total) by mouth daily. 45 tablet 3  . simvastatin (ZOCOR) 20 MG tablet Take 1 tablet (20 mg total) by mouth at bedtime. 30 tablet 6   No current facility-administered medications for this visit.    Allergies:  Sulfa antibiotics   Social History: The patient  reports that  has never  smoked. she has never used smokeless tobacco. She reports that she does not drink alcohol or use drugs.   ROS:  Please see the history of present illness. Otherwise, complete review of systems is positive for none.  All other systems are reviewed and negative.   Physical Exam: VS:  BP 130/64   Pulse 78   Ht 5' (1.524 m)   Wt 133 lb (60.3 kg)   SpO2 96%   BMI 25.97 kg/m , BMI Body mass index is 25.97 kg/m.  Wt Readings from Last 3 Encounters:  12/25/16 133 lb (60.3 kg)  10/29/16 134 lb 12.8 oz (61.1 kg)  07/31/16 129 lb 12.8 oz (58.9 kg)    General: Elderly  woman, appears comfortable at rest. HEENT: Conjunctiva and lids normal, oropharynx clear. Neck: Supple, no elevated JVP or carotid bruits, no thyromegaly. Lungs: Clear to auscultation, nonlabored breathing at rest. Cardiac: Regular rate and rhythm, no S3, 2/6 systolic murmur. Abdomen: Soft, nontender, bowel sounds present, no guarding or rebound. Extremities: No pitting edema, distal pulses 2+. Skin: Warm and dry. Musculoskeletal: No kyphosis. Neuropsychiatric: Alert and oriented x3, affect grossly appropriate.  ECG: I personally reviewed the tracing from 09/24/2016 which showed an atrial paced rhythm.  Recent Labwork: 07/26/2016: BUN 19; Creatinine, Ser 0.93; Hemoglobin 12.4; Platelets 192; Potassium 4.7; Sodium 137   Other Studies Reviewed Today:  Echocardiogram 07/12/2015: Study Conclusions  - Left ventricle: The cavity size was normal. Wall thickness was normal. Systolic function was vigorous. The estimated ejection fraction was in the range of 65% to 70%. Normal global longitudinal strain of -21.8%. Wall motion was normal; there were no regional wall motion abnormalities. Doppler parameters are consistent with abnormal left ventricular relaxation (grade 1 diastolic dysfunction). - Aortic valve: Moderately calcified annulus. Trileaflet; mildly calcified leaflets. There was trivial regurgitation. - Mitral valve: Calcified annulus. There was trivial regurgitation. - Right atrium: Central venous pressure (est): 8 mm Hg. - Tricuspid valve: There was trivial regurgitation. - Pulmonary arteries: PA peak pressure: 32 mm Hg (S). - Pericardium, extracardiac: There was no pericardial effusion.  Impressions:  - Normal LV wall thickness with LVEF 65-70%. Grade 1 diastolic dysfunction with normal estimated LV filling pressure. MAC with trivial mitral regurgitation. Sclerotic aortic valve with trivial aortic regurgitation. Trivial tricuspid regurgitation with  normal estimated PASP 32 mmHg.  Assessment and Plan:  1.  CAD status post DES x2 to the LAD/diagonal bifurcation in 2015 at [facility].  Dual antiplatelet therapy was recommended indefinitely.  She does not report any active angina and we will continue with current regimen.  2.  Sinus node dysfunction with symptomatic bradycardia now status post Biotronik dual-chamber pacemaker.  She follows with Dr. Lovena Le.  3.  Essential hypertension, blood pressure control is adequate today.  Continue with present regimen.  4.  Aortic valve sclerosis without stenosis.  Current medicines were reviewed with the patient today.  Disposition: Follow-up in 6 months.  Signed, Satira Sark, MD, [facility] 12/25/2016 12:07 PM    [place] [facility] [place], [place], [place] [address] Phone: [phone]; Fax: [phone]

## 2016-12-25 ENCOUNTER — Ambulatory Visit (INDEPENDENT_AMBULATORY_CARE_PROVIDER_SITE_OTHER): Payer: Medicare Other | Admitting: Cardiology

## 2016-12-25 ENCOUNTER — Encounter: Payer: Self-pay | Admitting: Cardiology

## 2016-12-25 VITALS — BP 130/64 | HR 78 | Ht 60.0 in | Wt 133.0 lb

## 2016-12-25 DIAGNOSIS — I25810 Atherosclerosis of coronary artery bypass graft(s) without angina pectoris: Secondary | ICD-10-CM

## 2016-12-25 DIAGNOSIS — I10 Essential (primary) hypertension: Secondary | ICD-10-CM

## 2016-12-25 DIAGNOSIS — I358 Other nonrheumatic aortic valve disorders: Secondary | ICD-10-CM

## 2016-12-25 DIAGNOSIS — I209 Angina pectoris, unspecified: Secondary | ICD-10-CM

## 2016-12-25 DIAGNOSIS — I25119 Atherosclerotic heart disease of native coronary artery with unspecified angina pectoris: Secondary | ICD-10-CM

## 2016-12-25 DIAGNOSIS — I495 Sick sinus syndrome: Secondary | ICD-10-CM | POA: Diagnosis not present

## 2016-12-25 NOTE — Patient Instructions (Signed)

## 2017-01-01 ENCOUNTER — Telehealth: Payer: Self-pay | Admitting: Internal Medicine

## 2017-01-01 NOTE — Telephone Encounter (Signed)
Rosette Reveal at dentist office that pt did not need pre medication prior to cleaning nor does she need to stop Effient for cleaning. Donah Driver thanks me for informing them.

## 2017-01-01 NOTE — Telephone Encounter (Signed)
New Message     1. What dental office are you calling from? Dr Camila Li Dowdy  2. What is your office phone and fax number?  [phone] fax [phone]  3. What type of procedure is the patient having performed?  cleaning   4. What date is procedure scheduled or is the patient there now? In chair now   5. What is your question (ex. Antibiotics prior to procedure, holding medication-we need to know how long dentist wants pt to hold med)? Does she need pre med and effient ?

## 2017-01-29 ENCOUNTER — Ambulatory Visit (INDEPENDENT_AMBULATORY_CARE_PROVIDER_SITE_OTHER): Payer: Medicare Other | Admitting: *Deleted

## 2017-01-29 DIAGNOSIS — I495 Sick sinus syndrome: Secondary | ICD-10-CM

## 2017-01-30 ENCOUNTER — Encounter: Payer: Self-pay | Admitting: Cardiology

## 2017-01-30 NOTE — Progress Notes (Signed)
Remote pacemaker transmission.   

## 2017-02-10 LAB — CUP PACEART REMOTE DEVICE CHECK
Brady Statistic AP VP Percent: 0 %
Brady Statistic AS VP Percent: 0 %
Brady Statistic RA Percent Paced: 70 %
Implantable Lead Implant Date: 20180626
Implantable Lead Location: 753859
Implantable Lead Location: 753860
Implantable Lead Model: 377
Implantable Lead Serial Number: 49865965
Implantable Pulse Generator Implant Date: 20180626
Lead Channel Impedance Value: 596 Ohm
Lead Channel Impedance Value: 613 Ohm
Lead Channel Pacing Threshold Amplitude: 0.9 V
Lead Channel Pacing Threshold Pulse Width: 0.4 ms
Lead Channel Setting Pacing Amplitude: 2 V
Lead Channel Setting Pacing Amplitude: 2.4 V
Lead Channel Setting Pacing Pulse Width: 0.4 ms
MDC IDC LEAD IMPLANT DT: 20180626
MDC IDC LEAD SERIAL: 49791993
MDC IDC MSMT LEADCHNL RA PACING THRESHOLD PULSEWIDTH: 0.4 ms
MDC IDC MSMT LEADCHNL RV PACING THRESHOLD AMPLITUDE: 0.7 V
MDC IDC SESS DTM: 20190107042705
MDC IDC STAT BRADY AP VS PERCENT: 70 %
MDC IDC STAT BRADY AS VS PERCENT: 29 %
MDC IDC STAT BRADY RV PERCENT PACED: 0 %
Pulse Gen Serial Number: 69052081

## 2017-02-20 ENCOUNTER — Other Ambulatory Visit: Payer: Self-pay | Admitting: Cardiology

## 2017-02-24 DIAGNOSIS — Z6826 Body mass index (BMI) 26.0-26.9, adult: Secondary | ICD-10-CM | POA: Diagnosis not present

## 2017-02-24 DIAGNOSIS — S39012A Strain of muscle, fascia and tendon of lower back, initial encounter: Secondary | ICD-10-CM | POA: Diagnosis not present

## 2017-03-03 DIAGNOSIS — I10 Essential (primary) hypertension: Secondary | ICD-10-CM | POA: Diagnosis not present

## 2017-03-03 DIAGNOSIS — E039 Hypothyroidism, unspecified: Secondary | ICD-10-CM | POA: Diagnosis not present

## 2017-03-03 DIAGNOSIS — Z950 Presence of cardiac pacemaker: Secondary | ICD-10-CM | POA: Diagnosis not present

## 2017-03-03 DIAGNOSIS — M545 Low back pain: Secondary | ICD-10-CM | POA: Diagnosis not present

## 2017-03-03 DIAGNOSIS — I2510 Atherosclerotic heart disease of native coronary artery without angina pectoris: Secondary | ICD-10-CM | POA: Diagnosis not present

## 2017-03-03 DIAGNOSIS — E782 Mixed hyperlipidemia: Secondary | ICD-10-CM | POA: Diagnosis not present

## 2017-03-03 DIAGNOSIS — Z955 Presence of coronary angioplasty implant and graft: Secondary | ICD-10-CM | POA: Diagnosis not present

## 2017-03-03 DIAGNOSIS — Z6826 Body mass index (BMI) 26.0-26.9, adult: Secondary | ICD-10-CM | POA: Diagnosis not present

## 2017-03-07 DIAGNOSIS — Z950 Presence of cardiac pacemaker: Secondary | ICD-10-CM | POA: Diagnosis not present

## 2017-03-07 DIAGNOSIS — M2560 Stiffness of unspecified joint, not elsewhere classified: Secondary | ICD-10-CM | POA: Diagnosis not present

## 2017-03-07 DIAGNOSIS — R262 Difficulty in walking, not elsewhere classified: Secondary | ICD-10-CM | POA: Diagnosis not present

## 2017-03-07 DIAGNOSIS — M545 Low back pain: Secondary | ICD-10-CM | POA: Diagnosis not present

## 2017-03-07 DIAGNOSIS — R293 Abnormal posture: Secondary | ICD-10-CM | POA: Diagnosis not present

## 2017-03-10 DIAGNOSIS — R293 Abnormal posture: Secondary | ICD-10-CM | POA: Diagnosis not present

## 2017-03-10 DIAGNOSIS — M545 Low back pain: Secondary | ICD-10-CM | POA: Diagnosis not present

## 2017-03-10 DIAGNOSIS — Z950 Presence of cardiac pacemaker: Secondary | ICD-10-CM | POA: Diagnosis not present

## 2017-03-10 DIAGNOSIS — M2560 Stiffness of unspecified joint, not elsewhere classified: Secondary | ICD-10-CM | POA: Diagnosis not present

## 2017-03-10 DIAGNOSIS — R262 Difficulty in walking, not elsewhere classified: Secondary | ICD-10-CM | POA: Diagnosis not present

## 2017-03-13 DIAGNOSIS — Z950 Presence of cardiac pacemaker: Secondary | ICD-10-CM | POA: Diagnosis not present

## 2017-03-13 DIAGNOSIS — R262 Difficulty in walking, not elsewhere classified: Secondary | ICD-10-CM | POA: Diagnosis not present

## 2017-03-13 DIAGNOSIS — M2560 Stiffness of unspecified joint, not elsewhere classified: Secondary | ICD-10-CM | POA: Diagnosis not present

## 2017-03-13 DIAGNOSIS — R293 Abnormal posture: Secondary | ICD-10-CM | POA: Diagnosis not present

## 2017-03-13 DIAGNOSIS — M545 Low back pain: Secondary | ICD-10-CM | POA: Diagnosis not present

## 2017-03-18 DIAGNOSIS — M2560 Stiffness of unspecified joint, not elsewhere classified: Secondary | ICD-10-CM | POA: Diagnosis not present

## 2017-03-18 DIAGNOSIS — M545 Low back pain: Secondary | ICD-10-CM | POA: Diagnosis not present

## 2017-03-18 DIAGNOSIS — R262 Difficulty in walking, not elsewhere classified: Secondary | ICD-10-CM | POA: Diagnosis not present

## 2017-03-18 DIAGNOSIS — Z950 Presence of cardiac pacemaker: Secondary | ICD-10-CM | POA: Diagnosis not present

## 2017-03-18 DIAGNOSIS — R293 Abnormal posture: Secondary | ICD-10-CM | POA: Diagnosis not present

## 2017-03-20 ENCOUNTER — Other Ambulatory Visit: Payer: Self-pay | Admitting: Cardiology

## 2017-03-20 MED ORDER — PRASUGREL HCL 10 MG PO TABS: 5.0000 mg | ORAL_TABLET | Freq: Every day | ORAL | 3 refills | Status: DC

## 2017-03-20 NOTE — Telephone Encounter (Signed)
Medication sent to pharmacy  

## 2017-03-20 NOTE — Telephone Encounter (Signed)
° ° ° °  1. Which medications need to be refilled? (please list name of each medication and dose if known) prasugrel (EFFIENT) 10 MG TABS tablet    2. Which pharmacy/location (including street and city if local pharmacy) is medication to be sent to? [facility], [place]   3. Do they need a 30 day or 90 day supply?

## 2017-03-21 DIAGNOSIS — Z950 Presence of cardiac pacemaker: Secondary | ICD-10-CM | POA: Diagnosis not present

## 2017-03-21 DIAGNOSIS — R293 Abnormal posture: Secondary | ICD-10-CM | POA: Diagnosis not present

## 2017-03-21 DIAGNOSIS — M2560 Stiffness of unspecified joint, not elsewhere classified: Secondary | ICD-10-CM | POA: Diagnosis not present

## 2017-03-21 DIAGNOSIS — R262 Difficulty in walking, not elsewhere classified: Secondary | ICD-10-CM | POA: Diagnosis not present

## 2017-03-21 DIAGNOSIS — M545 Low back pain: Secondary | ICD-10-CM | POA: Diagnosis not present

## 2017-03-26 DIAGNOSIS — M545 Low back pain: Secondary | ICD-10-CM | POA: Diagnosis not present

## 2017-03-26 DIAGNOSIS — Z950 Presence of cardiac pacemaker: Secondary | ICD-10-CM | POA: Diagnosis not present

## 2017-03-26 DIAGNOSIS — R293 Abnormal posture: Secondary | ICD-10-CM | POA: Diagnosis not present

## 2017-03-26 DIAGNOSIS — M2560 Stiffness of unspecified joint, not elsewhere classified: Secondary | ICD-10-CM | POA: Diagnosis not present

## 2017-03-26 DIAGNOSIS — R262 Difficulty in walking, not elsewhere classified: Secondary | ICD-10-CM | POA: Diagnosis not present

## 2017-03-27 DIAGNOSIS — Z950 Presence of cardiac pacemaker: Secondary | ICD-10-CM | POA: Diagnosis not present

## 2017-03-27 DIAGNOSIS — R293 Abnormal posture: Secondary | ICD-10-CM | POA: Diagnosis not present

## 2017-03-27 DIAGNOSIS — M2560 Stiffness of unspecified joint, not elsewhere classified: Secondary | ICD-10-CM | POA: Diagnosis not present

## 2017-03-27 DIAGNOSIS — M545 Low back pain: Secondary | ICD-10-CM | POA: Diagnosis not present

## 2017-03-27 DIAGNOSIS — R262 Difficulty in walking, not elsewhere classified: Secondary | ICD-10-CM | POA: Diagnosis not present

## 2017-04-01 DIAGNOSIS — Z950 Presence of cardiac pacemaker: Secondary | ICD-10-CM | POA: Diagnosis not present

## 2017-04-01 DIAGNOSIS — R293 Abnormal posture: Secondary | ICD-10-CM | POA: Diagnosis not present

## 2017-04-01 DIAGNOSIS — M545 Low back pain: Secondary | ICD-10-CM | POA: Diagnosis not present

## 2017-04-01 DIAGNOSIS — M2560 Stiffness of unspecified joint, not elsewhere classified: Secondary | ICD-10-CM | POA: Diagnosis not present

## 2017-04-01 DIAGNOSIS — R262 Difficulty in walking, not elsewhere classified: Secondary | ICD-10-CM | POA: Diagnosis not present

## 2017-04-02 DIAGNOSIS — L900 Lichen sclerosus et atrophicus: Secondary | ICD-10-CM | POA: Diagnosis not present

## 2017-04-04 DIAGNOSIS — R293 Abnormal posture: Secondary | ICD-10-CM | POA: Diagnosis not present

## 2017-04-04 DIAGNOSIS — Z950 Presence of cardiac pacemaker: Secondary | ICD-10-CM | POA: Diagnosis not present

## 2017-04-04 DIAGNOSIS — M545 Low back pain: Secondary | ICD-10-CM | POA: Diagnosis not present

## 2017-04-04 DIAGNOSIS — R262 Difficulty in walking, not elsewhere classified: Secondary | ICD-10-CM | POA: Diagnosis not present

## 2017-04-04 DIAGNOSIS — M2560 Stiffness of unspecified joint, not elsewhere classified: Secondary | ICD-10-CM | POA: Diagnosis not present

## 2017-04-09 DIAGNOSIS — M545 Low back pain: Secondary | ICD-10-CM | POA: Diagnosis not present

## 2017-04-09 DIAGNOSIS — R262 Difficulty in walking, not elsewhere classified: Secondary | ICD-10-CM | POA: Diagnosis not present

## 2017-04-09 DIAGNOSIS — Z950 Presence of cardiac pacemaker: Secondary | ICD-10-CM | POA: Diagnosis not present

## 2017-04-09 DIAGNOSIS — M2560 Stiffness of unspecified joint, not elsewhere classified: Secondary | ICD-10-CM | POA: Diagnosis not present

## 2017-04-09 DIAGNOSIS — R293 Abnormal posture: Secondary | ICD-10-CM | POA: Diagnosis not present

## 2017-04-10 DIAGNOSIS — R293 Abnormal posture: Secondary | ICD-10-CM | POA: Diagnosis not present

## 2017-04-10 DIAGNOSIS — Z950 Presence of cardiac pacemaker: Secondary | ICD-10-CM | POA: Diagnosis not present

## 2017-04-10 DIAGNOSIS — M545 Low back pain: Secondary | ICD-10-CM | POA: Diagnosis not present

## 2017-04-10 DIAGNOSIS — M2560 Stiffness of unspecified joint, not elsewhere classified: Secondary | ICD-10-CM | POA: Diagnosis not present

## 2017-04-10 DIAGNOSIS — R262 Difficulty in walking, not elsewhere classified: Secondary | ICD-10-CM | POA: Diagnosis not present

## 2017-04-15 DIAGNOSIS — M2560 Stiffness of unspecified joint, not elsewhere classified: Secondary | ICD-10-CM | POA: Diagnosis not present

## 2017-04-15 DIAGNOSIS — M545 Low back pain: Secondary | ICD-10-CM | POA: Diagnosis not present

## 2017-04-15 DIAGNOSIS — Z950 Presence of cardiac pacemaker: Secondary | ICD-10-CM | POA: Diagnosis not present

## 2017-04-15 DIAGNOSIS — R293 Abnormal posture: Secondary | ICD-10-CM | POA: Diagnosis not present

## 2017-04-15 DIAGNOSIS — R262 Difficulty in walking, not elsewhere classified: Secondary | ICD-10-CM | POA: Diagnosis not present

## 2017-04-16 DIAGNOSIS — M545 Low back pain: Secondary | ICD-10-CM | POA: Diagnosis not present

## 2017-04-16 DIAGNOSIS — R293 Abnormal posture: Secondary | ICD-10-CM | POA: Diagnosis not present

## 2017-04-16 DIAGNOSIS — R262 Difficulty in walking, not elsewhere classified: Secondary | ICD-10-CM | POA: Diagnosis not present

## 2017-04-16 DIAGNOSIS — M2560 Stiffness of unspecified joint, not elsewhere classified: Secondary | ICD-10-CM | POA: Diagnosis not present

## 2017-04-16 DIAGNOSIS — Z950 Presence of cardiac pacemaker: Secondary | ICD-10-CM | POA: Diagnosis not present

## 2017-04-23 DIAGNOSIS — M2560 Stiffness of unspecified joint, not elsewhere classified: Secondary | ICD-10-CM | POA: Diagnosis not present

## 2017-04-23 DIAGNOSIS — Z950 Presence of cardiac pacemaker: Secondary | ICD-10-CM | POA: Diagnosis not present

## 2017-04-23 DIAGNOSIS — M545 Low back pain: Secondary | ICD-10-CM | POA: Diagnosis not present

## 2017-04-23 DIAGNOSIS — R293 Abnormal posture: Secondary | ICD-10-CM | POA: Diagnosis not present

## 2017-04-23 DIAGNOSIS — R262 Difficulty in walking, not elsewhere classified: Secondary | ICD-10-CM | POA: Diagnosis not present

## 2017-04-30 ENCOUNTER — Ambulatory Visit (INDEPENDENT_AMBULATORY_CARE_PROVIDER_SITE_OTHER): Payer: Medicare Other | Admitting: *Deleted

## 2017-04-30 DIAGNOSIS — I495 Sick sinus syndrome: Secondary | ICD-10-CM | POA: Diagnosis not present

## 2017-04-30 NOTE — Progress Notes (Signed)
Remote pacemaker transmission.   

## 2017-05-05 ENCOUNTER — Encounter: Payer: Self-pay | Admitting: Cardiology

## 2017-05-07 DIAGNOSIS — R262 Difficulty in walking, not elsewhere classified: Secondary | ICD-10-CM | POA: Diagnosis not present

## 2017-05-07 DIAGNOSIS — M2560 Stiffness of unspecified joint, not elsewhere classified: Secondary | ICD-10-CM | POA: Diagnosis not present

## 2017-05-07 DIAGNOSIS — R293 Abnormal posture: Secondary | ICD-10-CM | POA: Diagnosis not present

## 2017-05-07 DIAGNOSIS — Z950 Presence of cardiac pacemaker: Secondary | ICD-10-CM | POA: Diagnosis not present

## 2017-05-07 DIAGNOSIS — M545 Low back pain: Secondary | ICD-10-CM | POA: Diagnosis not present

## 2017-05-08 DIAGNOSIS — M2560 Stiffness of unspecified joint, not elsewhere classified: Secondary | ICD-10-CM | POA: Diagnosis not present

## 2017-05-08 DIAGNOSIS — M545 Low back pain: Secondary | ICD-10-CM | POA: Diagnosis not present

## 2017-05-08 DIAGNOSIS — Z950 Presence of cardiac pacemaker: Secondary | ICD-10-CM | POA: Diagnosis not present

## 2017-05-08 DIAGNOSIS — R293 Abnormal posture: Secondary | ICD-10-CM | POA: Diagnosis not present

## 2017-05-08 DIAGNOSIS — R262 Difficulty in walking, not elsewhere classified: Secondary | ICD-10-CM | POA: Diagnosis not present

## 2017-05-11 LAB — CUP PACEART REMOTE DEVICE CHECK
Date Time Interrogation Session: 20190407205151
Implantable Lead Implant Date: 20180626
Implantable Lead Location: 753859
Implantable Lead Model: 377
Implantable Lead Serial Number: 49865965
Implantable Pulse Generator Implant Date: 20180626
MDC IDC LEAD IMPLANT DT: 20180626
MDC IDC LEAD LOCATION: 753860
MDC IDC LEAD SERIAL: 49791993
Pulse Gen Model: 407145
Pulse Gen Serial Number: 69052081

## 2017-05-12 DIAGNOSIS — L400 Psoriasis vulgaris: Secondary | ICD-10-CM | POA: Diagnosis not present

## 2017-05-12 DIAGNOSIS — D171 Benign lipomatous neoplasm of skin and subcutaneous tissue of trunk: Secondary | ICD-10-CM | POA: Diagnosis not present

## 2017-05-13 ENCOUNTER — Telehealth: Payer: Self-pay

## 2017-05-13 DIAGNOSIS — M545 Low back pain: Secondary | ICD-10-CM | POA: Diagnosis not present

## 2017-05-13 DIAGNOSIS — R293 Abnormal posture: Secondary | ICD-10-CM | POA: Diagnosis not present

## 2017-05-13 DIAGNOSIS — M2560 Stiffness of unspecified joint, not elsewhere classified: Secondary | ICD-10-CM | POA: Diagnosis not present

## 2017-05-13 DIAGNOSIS — R262 Difficulty in walking, not elsewhere classified: Secondary | ICD-10-CM | POA: Diagnosis not present

## 2017-05-13 DIAGNOSIS — Z950 Presence of cardiac pacemaker: Secondary | ICD-10-CM | POA: Diagnosis not present

## 2017-05-13 NOTE — Telephone Encounter (Signed)
Received a call from physical therapy this morning concerning the patients heart rate. The pt advised the therapist during their session this morning that she was advised by her doctor to keep her heart rate at 110 bpm but no higher during exercise. The therapist wanted to get the parameters that is safe for the pts physical therapy. She sated that the pt does well, but does achieve the heart rate of 110 bpm pretty quick and it would benefit pt better if she could go a littler longer. Please advise.

## 2017-05-13 NOTE — Telephone Encounter (Signed)
I do not recall ever giving her specific instructions that her heart rate should not be over 110 bpm.  Based on her age, 85% of her maximum predicted heart rate is 125 bpm.  So if she is feeling well and tolerating physical therapy with heart rates under 125 bpm, this should not be putting any unusual amount of stress on her.

## 2017-05-13 NOTE — Telephone Encounter (Signed)
Returned call. Spoke with [name], advised her of doctor recommendations. She voiced understanding.

## 2017-05-15 DIAGNOSIS — M545 Low back pain: Secondary | ICD-10-CM | POA: Diagnosis not present

## 2017-05-15 DIAGNOSIS — R293 Abnormal posture: Secondary | ICD-10-CM | POA: Diagnosis not present

## 2017-05-15 DIAGNOSIS — R262 Difficulty in walking, not elsewhere classified: Secondary | ICD-10-CM | POA: Diagnosis not present

## 2017-05-15 DIAGNOSIS — Z950 Presence of cardiac pacemaker: Secondary | ICD-10-CM | POA: Diagnosis not present

## 2017-05-15 DIAGNOSIS — M2560 Stiffness of unspecified joint, not elsewhere classified: Secondary | ICD-10-CM | POA: Diagnosis not present

## 2017-05-20 DIAGNOSIS — M545 Low back pain: Secondary | ICD-10-CM | POA: Diagnosis not present

## 2017-05-20 DIAGNOSIS — Z950 Presence of cardiac pacemaker: Secondary | ICD-10-CM | POA: Diagnosis not present

## 2017-05-20 DIAGNOSIS — R262 Difficulty in walking, not elsewhere classified: Secondary | ICD-10-CM | POA: Diagnosis not present

## 2017-05-20 DIAGNOSIS — M2560 Stiffness of unspecified joint, not elsewhere classified: Secondary | ICD-10-CM | POA: Diagnosis not present

## 2017-05-20 DIAGNOSIS — R293 Abnormal posture: Secondary | ICD-10-CM | POA: Diagnosis not present

## 2017-05-22 DIAGNOSIS — R262 Difficulty in walking, not elsewhere classified: Secondary | ICD-10-CM | POA: Diagnosis not present

## 2017-05-22 DIAGNOSIS — M2560 Stiffness of unspecified joint, not elsewhere classified: Secondary | ICD-10-CM | POA: Diagnosis not present

## 2017-05-22 DIAGNOSIS — Z950 Presence of cardiac pacemaker: Secondary | ICD-10-CM | POA: Diagnosis not present

## 2017-05-22 DIAGNOSIS — R293 Abnormal posture: Secondary | ICD-10-CM | POA: Diagnosis not present

## 2017-05-22 DIAGNOSIS — M545 Low back pain: Secondary | ICD-10-CM | POA: Diagnosis not present

## 2017-05-27 DIAGNOSIS — M2560 Stiffness of unspecified joint, not elsewhere classified: Secondary | ICD-10-CM | POA: Diagnosis not present

## 2017-05-27 DIAGNOSIS — R262 Difficulty in walking, not elsewhere classified: Secondary | ICD-10-CM | POA: Diagnosis not present

## 2017-05-27 DIAGNOSIS — Z950 Presence of cardiac pacemaker: Secondary | ICD-10-CM | POA: Diagnosis not present

## 2017-05-27 DIAGNOSIS — M545 Low back pain: Secondary | ICD-10-CM | POA: Diagnosis not present

## 2017-05-27 DIAGNOSIS — R293 Abnormal posture: Secondary | ICD-10-CM | POA: Diagnosis not present

## 2017-05-30 DIAGNOSIS — R262 Difficulty in walking, not elsewhere classified: Secondary | ICD-10-CM | POA: Diagnosis not present

## 2017-05-30 DIAGNOSIS — Z950 Presence of cardiac pacemaker: Secondary | ICD-10-CM | POA: Diagnosis not present

## 2017-05-30 DIAGNOSIS — M2560 Stiffness of unspecified joint, not elsewhere classified: Secondary | ICD-10-CM | POA: Diagnosis not present

## 2017-05-30 DIAGNOSIS — R293 Abnormal posture: Secondary | ICD-10-CM | POA: Diagnosis not present

## 2017-05-30 DIAGNOSIS — M545 Low back pain: Secondary | ICD-10-CM | POA: Diagnosis not present

## 2017-06-03 DIAGNOSIS — M2560 Stiffness of unspecified joint, not elsewhere classified: Secondary | ICD-10-CM | POA: Diagnosis not present

## 2017-06-03 DIAGNOSIS — R293 Abnormal posture: Secondary | ICD-10-CM | POA: Diagnosis not present

## 2017-06-03 DIAGNOSIS — Z950 Presence of cardiac pacemaker: Secondary | ICD-10-CM | POA: Diagnosis not present

## 2017-06-03 DIAGNOSIS — R262 Difficulty in walking, not elsewhere classified: Secondary | ICD-10-CM | POA: Diagnosis not present

## 2017-06-03 DIAGNOSIS — M545 Low back pain: Secondary | ICD-10-CM | POA: Diagnosis not present

## 2017-06-04 DIAGNOSIS — Z6827 Body mass index (BMI) 27.0-27.9, adult: Secondary | ICD-10-CM | POA: Diagnosis not present

## 2017-06-04 DIAGNOSIS — R300 Dysuria: Secondary | ICD-10-CM | POA: Diagnosis not present

## 2017-06-06 DIAGNOSIS — M545 Low back pain: Secondary | ICD-10-CM | POA: Diagnosis not present

## 2017-06-06 DIAGNOSIS — Z950 Presence of cardiac pacemaker: Secondary | ICD-10-CM | POA: Diagnosis not present

## 2017-06-06 DIAGNOSIS — R262 Difficulty in walking, not elsewhere classified: Secondary | ICD-10-CM | POA: Diagnosis not present

## 2017-06-06 DIAGNOSIS — R293 Abnormal posture: Secondary | ICD-10-CM | POA: Diagnosis not present

## 2017-06-06 DIAGNOSIS — M2560 Stiffness of unspecified joint, not elsewhere classified: Secondary | ICD-10-CM | POA: Diagnosis not present

## 2017-06-15 ENCOUNTER — Other Ambulatory Visit: Payer: Self-pay | Admitting: Cardiology

## 2017-06-23 DIAGNOSIS — K219 Gastro-esophageal reflux disease without esophagitis: Secondary | ICD-10-CM | POA: Diagnosis not present

## 2017-06-23 DIAGNOSIS — R001 Bradycardia, unspecified: Secondary | ICD-10-CM | POA: Diagnosis not present

## 2017-06-23 DIAGNOSIS — E039 Hypothyroidism, unspecified: Secondary | ICD-10-CM | POA: Diagnosis not present

## 2017-06-23 DIAGNOSIS — Z950 Presence of cardiac pacemaker: Secondary | ICD-10-CM | POA: Diagnosis not present

## 2017-06-23 DIAGNOSIS — E782 Mixed hyperlipidemia: Secondary | ICD-10-CM | POA: Diagnosis not present

## 2017-06-23 DIAGNOSIS — I10 Essential (primary) hypertension: Secondary | ICD-10-CM | POA: Diagnosis not present

## 2017-06-24 NOTE — Progress Notes (Signed)
Cardiology Office Note  Date: 06/25/2017   ID: Amy, [name] April 01, 1945, MRN [id number]  PCP: [name], MD  Primary Cardiologist: [name], MD   Chief Complaint  Patient presents with  . Coronary Artery Disease    History of Present Illness: Amy [name] is a 72 y.o. female last seen in November 2018.  She presents for a routine visit.  Describes good days and bad days in terms of fatigue, but is still exercising using an elliptical machine.  She has had some trouble with her back and is planning to see Dr. Pleas Koch soon.  He has had no lightheadedness or syncope.  She does not describe any obvious angina symptoms.  She follows with Dr. Lovena Le in the device clinic, Biotronik dual-chamber pacemaker in place.  I reviewed her medications.  Regimen includes aspirin, chlorthalidone, lisinopril, Effient, and Zocor.  She is having lab work followed by [facility] as well.  Past Medical History:  Diagnosis Date  . Asthma   . CAD (coronary artery disease)    60-70% proximal LAD/diagonal bifurcational disease October 2015 at [facility] status DES x 2 to LAD/diagonal October 2015  . Essential hypertension   . Hyperlipidemia   . Hypothyroidism     Past Surgical History:  Procedure Laterality Date  . ABDOMINAL HYSTERECTOMY    . ANAL FISSURE REPAIR    . CATARACT EXTRACTION W/PHACO Right 10/17/2014   Procedure: CATARACT EXTRACTION PHACO AND INTRAOCULAR LENS PLACEMENT ([place]);  Surgeon: Tonny Branch, MD;  Location: [facility];  Service: Ophthalmology;  Laterality: Right;  CDE: 4.88  . CATARACT EXTRACTION W/PHACO Left 10/27/2014   Procedure: CATARACT EXTRACTION PHACO AND INTRAOCULAR LENS PLACEMENT ([facility]);  Surgeon: Tonny Branch, MD;  Location: [facility];  Service: Ophthalmology;  Laterality: Left;  CDE:7.86  . FINGER SURGERY    . NASAL SINUS SURGERY    . PACEMAKER IMPLANT N/A 07/30/2016   Procedure: Pacemaker Implant Dual Chamber;  Surgeon: Evans Lance, MD;  Location: [place] [facility];   Service: Cardiovascular;  Laterality: N/A;  . TONSILLECTOMY      Current Outpatient Medications  Medication Sig Dispense Refill  . acetaminophen (TYLENOL) 650 MG CR tablet Take 650 mg by mouth daily as needed for pain.    Marland Kitchen aspirin 81 MG tablet Take 81 mg by mouth daily.    . Biotin (BIOTIN 5000) 5 MG CAPS Take 5 mg by mouth at bedtime.     . chlorthalidone (HYGROTON) 25 MG tablet TAKE 1 TABLET BY MOUTH ONCE DAILY 30 tablet 3  . Cholecalciferol (VITAMIN D3) 2000 units capsule Take 2,000 Units by mouth at bedtime.     . clobetasol (TEMOVATE) 0.05 % external solution Apply 1 application topically daily as needed (psoriasis).     Marland Kitchen levothyroxine (SYNTHROID, LEVOTHROID) 50 MCG tablet Take 50 mcg by mouth daily before breakfast.    . lisinopril (PRINIVIL,ZESTRIL) 5 MG tablet Take 0.5 tablets (2.5 mg total) by mouth daily. 45 tablet 3  . Multiple Vitamin (MULTIVITAMIN WITH MINERALS) TABS tablet Take 1 tablet by mouth at bedtime.     . prasugrel (EFFIENT) 10 MG TABS tablet Take 0.5 tablets (5 mg total) by mouth daily. 45 tablet 3  . simvastatin (ZOCOR) 20 MG tablet Take 1 tablet (20 mg total) by mouth at bedtime. 30 tablet 6   No current facility-administered medications for this visit.    Allergies:  Sulfa antibiotics   Social History: The patient  reports that she has never smoked. She  has never used smokeless tobacco. She reports that she does not drink alcohol or use drugs.   ROS:  Please see the history of present illness. Otherwise, complete review of systems is positive for lower back pain, hearing loss.  All other systems are reviewed and negative.   Physical Exam: VS:  BP (!) 145/66   Pulse 63   Ht 5' (1.524 m)   Wt 134 lb 9.6 oz (61.1 kg)   SpO2 100%   BMI 26.29 kg/m , BMI Body mass index is 26.29 kg/m.  Wt Readings from Last 3 Encounters:  06/25/17 134 lb 9.6 oz (61.1 kg)  12/25/16 133 lb (60.3 kg)  10/29/16 134 lb 12.8 oz (61.1 kg)    General: Elderly woman, appears  comfortable at rest. HEENT: Conjunctiva and lids normal, oropharynx clear. Neck: Supple, no elevated JVP or carotid bruits, no thyromegaly. Lungs: Clear to auscultation, nonlabored breathing at rest. Cardiac: Regular rate and rhythm, no S3, 2/6 systolic murmur. Abdomen: Soft, nontender, bowel sounds present. Extremities: No pitting edema, distal pulses 2+. Skin: Warm and dry. Musculoskeletal: No kyphosis. Neuropsychiatric: Alert and oriented x3, affect grossly appropriate.  ECG: I personally reviewed the tracing from 09/24/2016 which showed an atrial paced rhythm.  Recent Labwork: 07/26/2016: BUN 19; Creatinine, Ser 0.93; Hemoglobin 12.4; Platelets 192; Potassium 4.7; Sodium 137   Other Studies Reviewed Today:  Echocardiogram 07/12/2015: Study Conclusions  - Left ventricle: The cavity size was normal. Wall thickness was normal. Systolic function was vigorous. The estimated ejection fraction was in the range of 65% to 70%. Normal global longitudinal strain of -21.8%. Wall motion was normal; there were no regional wall motion abnormalities. Doppler parameters are consistent with abnormal left ventricular relaxation (grade 1 diastolic dysfunction). - Aortic valve: Moderately calcified annulus. Trileaflet; mildly calcified leaflets. There was trivial regurgitation. - Mitral valve: Calcified annulus. There was trivial regurgitation. - Right atrium: Central venous pressure (est): 8 mm Hg. - Tricuspid valve: There was trivial regurgitation. - Pulmonary arteries: PA peak pressure: 32 mm Hg (S). - Pericardium, extracardiac: There was no pericardial effusion.  Impressions:  - Normal LV wall thickness with LVEF 65-70%. Grade 1 diastolic dysfunction with normal estimated LV filling pressure. MAC with trivial mitral regurgitation. Sclerotic aortic valve with trivial aortic regurgitation. Trivial tricuspid regurgitation with normal estimated PASP 32  mmHg.  Assessment and Plan:  1.  Sinus node dysfunction with systematic bradycardia status post Biotronik pacemaker.  Continue follow-up with Dr. Lovena Le.  2.  CAD with history of DES x2 to the LAD/diagonal bifurcation in 2015 at [facility].  She remains on long-term dual antiplatelet therapy and reports no active angina at this time.  Continue regular exercise plan.  3.  Essential hypertension, keep follow-up with Dr. Pleas Koch.  Current medicines were reviewed with the patient today.  Disposition: Follow-up in 6 months.  Signed, Satira Sark, MD, [facility] 06/25/2017 9:44 AM    [facility] [place], [place],  [address] Phone: [phone]; Fax: [phone]

## 2017-06-25 ENCOUNTER — Ambulatory Visit (INDEPENDENT_AMBULATORY_CARE_PROVIDER_SITE_OTHER): Payer: Medicare Other | Admitting: Cardiology

## 2017-06-25 ENCOUNTER — Encounter: Payer: Self-pay | Admitting: Cardiology

## 2017-06-25 VITALS — BP 145/66 | HR 63 | Ht 60.0 in | Wt 134.6 lb

## 2017-06-25 DIAGNOSIS — I495 Sick sinus syndrome: Secondary | ICD-10-CM | POA: Diagnosis not present

## 2017-06-25 DIAGNOSIS — I25119 Atherosclerotic heart disease of native coronary artery with unspecified angina pectoris: Secondary | ICD-10-CM | POA: Diagnosis not present

## 2017-06-25 DIAGNOSIS — I10 Essential (primary) hypertension: Secondary | ICD-10-CM | POA: Diagnosis not present

## 2017-06-25 NOTE — Patient Instructions (Signed)

## 2017-06-27 DIAGNOSIS — Z6826 Body mass index (BMI) 26.0-26.9, adult: Secondary | ICD-10-CM | POA: Diagnosis not present

## 2017-06-27 DIAGNOSIS — I10 Essential (primary) hypertension: Secondary | ICD-10-CM | POA: Diagnosis not present

## 2017-06-27 DIAGNOSIS — N390 Urinary tract infection, site not specified: Secondary | ICD-10-CM | POA: Diagnosis not present

## 2017-06-27 DIAGNOSIS — Z1389 Encounter for screening for other disorder: Secondary | ICD-10-CM | POA: Diagnosis not present

## 2017-06-27 DIAGNOSIS — R001 Bradycardia, unspecified: Secondary | ICD-10-CM | POA: Diagnosis not present

## 2017-06-27 DIAGNOSIS — Z1331 Encounter for screening for depression: Secondary | ICD-10-CM | POA: Diagnosis not present

## 2017-06-27 DIAGNOSIS — E782 Mixed hyperlipidemia: Secondary | ICD-10-CM | POA: Diagnosis not present

## 2017-06-27 DIAGNOSIS — E039 Hypothyroidism, unspecified: Secondary | ICD-10-CM | POA: Diagnosis not present

## 2017-07-28 ENCOUNTER — Ambulatory Visit (INDEPENDENT_AMBULATORY_CARE_PROVIDER_SITE_OTHER): Payer: Medicare Other | Admitting: Internal Medicine

## 2017-07-28 ENCOUNTER — Encounter: Payer: Self-pay | Admitting: Internal Medicine

## 2017-07-28 VITALS — BP 140/62 | HR 60 | Ht 59.0 in | Wt 140.2 lb

## 2017-07-28 DIAGNOSIS — I495 Sick sinus syndrome: Secondary | ICD-10-CM | POA: Diagnosis not present

## 2017-07-28 DIAGNOSIS — R002 Palpitations: Secondary | ICD-10-CM | POA: Diagnosis not present

## 2017-07-28 DIAGNOSIS — Z950 Presence of cardiac pacemaker: Secondary | ICD-10-CM

## 2017-07-28 DIAGNOSIS — I10 Essential (primary) hypertension: Secondary | ICD-10-CM | POA: Diagnosis not present

## 2017-07-28 DIAGNOSIS — I25119 Atherosclerotic heart disease of native coronary artery with unspecified angina pectoris: Secondary | ICD-10-CM

## 2017-07-28 NOTE — Progress Notes (Signed)
HPI Mrs. Buckholtz returns today for followup. She is a pleasant 72 yo woman with symptomatic bradycardia, s/p PPM insertion. The patient c/o feeling tired. She denies chest pain or sob. No syncope. She has had some peripheral edema since stopping her diuretic and starting amlodipine.  Allergies  Allergen Reactions  . Sulfa Antibiotics Rash     Current Outpatient Medications  Medication Sig Dispense Refill  . acetaminophen (TYLENOL) 650 MG CR tablet Take 650 mg by mouth daily as needed for pain.    Marland Kitchen amLODipine (NORVASC) 10 MG tablet Take 10 mg by mouth daily. for high blood pressure  5  . aspirin 81 MG tablet Take 81 mg by mouth daily.    Marland Kitchen atorvastatin (LIPITOR) 20 MG tablet Take 20 mg by mouth daily.  3  . Biotin (BIOTIN 5000) 5 MG CAPS Take 5 mg by mouth at bedtime.     . Cholecalciferol (VITAMIN D3) 2000 units capsule Take 2,000 Units by mouth at bedtime.     . clobetasol (TEMOVATE) 0.05 % external solution Apply 1 application topically daily as needed (psoriasis).     Marland Kitchen levothyroxine (SYNTHROID, LEVOTHROID) 50 MCG tablet Take 50 mcg by mouth daily before breakfast.    . lisinopril (PRINIVIL,ZESTRIL) 5 MG tablet Take 0.5 tablets (2.5 mg total) by mouth daily. 45 tablet 3  . Multiple Vitamin (MULTIVITAMIN WITH MINERALS) TABS tablet Take 1 tablet by mouth at bedtime.     . prasugrel (EFFIENT) 10 MG TABS tablet Take 0.5 tablets (5 mg total) by mouth daily. 45 tablet 3   No current facility-administered medications for this visit.      Past Medical History:  Diagnosis Date  . Asthma   . CAD (coronary artery disease)    60-70% proximal LAD/diagonal bifurcational disease October 2015 at [facility] status DES x 2 to LAD/diagonal October 2015  . Essential hypertension   . Hyperlipidemia   . Hypothyroidism     ROS:   All systems reviewed and negative except as noted in the HPI.   Past Surgical History:  Procedure Laterality Date  . ABDOMINAL HYSTERECTOMY    . ANAL FISSURE  REPAIR    . CATARACT EXTRACTION W/PHACO Right 10/17/2014   Procedure: CATARACT EXTRACTION PHACO AND INTRAOCULAR LENS PLACEMENT ([place]);  Surgeon: Tonny Branch, MD;  Location: [facility];  Service: Ophthalmology;  Laterality: Right;  CDE: 4.88  . CATARACT EXTRACTION W/PHACO Left 10/27/2014   Procedure: CATARACT EXTRACTION PHACO AND INTRAOCULAR LENS PLACEMENT ([facility]);  Surgeon: Tonny Branch, MD;  Location: [facility];  Service: Ophthalmology;  Laterality: Left;  CDE:7.86  . FINGER SURGERY    . NASAL SINUS SURGERY    . PACEMAKER IMPLANT N/A 07/30/2016   Procedure: Pacemaker Implant Dual Chamber;  Surgeon: Evans Lance, MD;  Location: [place] [facility];  Service: Cardiovascular;  Laterality: N/A;  . TONSILLECTOMY       Family History  Problem Relation Age of Onset  . Heart disease Father      Social History   Socioeconomic History  . Marital status: Widowed    Spouse name: Not on file  . Number of children: Not on file  . Years of education: Not on file  . Highest education level: Not on file  Occupational History  . Not on file  Social Needs  . Financial resource strain: Not on file  . Food insecurity:    Worry: Not on file    Inability: Not on file  . Transportation needs:  Medical: Not on file    Non-medical: Not on file  Tobacco Use  . Smoking status: Never Smoker  . Smokeless tobacco: Never Used  Substance and Sexual Activity  . Alcohol use: No    Alcohol/week: 0.0 oz  . Drug use: No  . Sexual activity: Never  Lifestyle  . Physical activity:    Days per week: Not on file    Minutes per session: Not on file  . Stress: Not on file  Relationships  . Social connections:    Talks on phone: Not on file    Gets together: Not on file    Attends religious service: Not on file    Active member of club or organization: Not on file    Attends meetings of clubs or organizations: Not on file    Relationship status: Not on file  . Intimate partner violence:    Fear of current or ex  partner: Not on file    Emotionally abused: Not on file    Physically abused: Not on file    Forced sexual activity: Not on file  Other Topics Concern  . Not on file  Social History Narrative  . Not on file     BP 140/62   Pulse 60   Ht 4\' 11"  (1.499 m)   Wt 140 lb 3.2 oz (63.6 kg)   BMI 28.32 kg/m   Physical Exam:  Well appearing 72 yo woman, NAD HEENT: Unremarkable Neck:  6 cm JVD, no thyromegally Lymphatics:  No adenopathy Back:  No CVA tenderness Lungs:  Clear with no wheezes HEART:  Regular rate rhythm, no murmurs, no rubs, no clicks Abd:  soft, positive bowel sounds, no organomegally, no rebound, no guarding Ext:  2 plus pulses, no edema, no cyanosis, no clubbing Skin:  No rashes no nodules Neuro:  CN II through XII intact, motor grossly intact  EKG - NSR with atrial pacing  DEVICE  Normal device function.  See PaceArt for details.   Assess/Plan: 1. Sinus node dysfunction - the patient is s/p PPM insertion. She is asymptomatic.  2. PPM - her Biotronik DDD PM is working normally. Will recheck in several months. 3. HTN - her blood pressure is well controlled. She will continue her current meds.  4. Fatigue - the etiology is unclear. Her rate appears to be well controlled and her rate response looks good. I have encouraged her to increase her physical activity.  Mikle Bosworth.D.

## 2017-07-28 NOTE — Patient Instructions (Addendum)
Medication Instructions:  Your physician recommends that you continue on your current medications as directed. Please refer to the Current Medication list given to you today.  Labwork: None ordered.  Testing/Procedures: None ordered.  Follow-Up: Your physician wants you to follow-up in: one year with Dr. Lovena Le in [place].   You will receive a reminder letter in the mail two months in advance. If you don't receive a letter, please call our office to schedule the follow-up appointment.  Remote monitoring is used to monitor your Pacemaker from home. This monitoring reduces the number of office visits required to check your device to one time per year. It allows [place] to keep an eye on the functioning of your device to ensure it is working properly. You are scheduled for a device check from home on 07/30/2017. You may send your transmission at any time that day. If you have a wireless device, the transmission will be sent automatically. After your physician reviews your transmission, you will receive a postcard with your next transmission date.  Any Other Special Instructions Will Be Listed Below (If Applicable).  If you need a refill on your cardiac medications before your next appointment, please call your pharmacy.

## 2017-07-30 ENCOUNTER — Ambulatory Visit (INDEPENDENT_AMBULATORY_CARE_PROVIDER_SITE_OTHER): Payer: Medicare Other | Admitting: *Deleted

## 2017-07-30 DIAGNOSIS — I495 Sick sinus syndrome: Secondary | ICD-10-CM

## 2017-07-30 LAB — CUP PACEART REMOTE DEVICE CHECK
Brady Statistic AP VP Percent: 0 %
Brady Statistic AS VP Percent: 0 %
Brady Statistic AS VS Percent: 11 %
Date Time Interrogation Session: 20190626050543
Implantable Lead Implant Date: 20180626
Implantable Lead Location: 753859
Implantable Lead Location: 753860
Implantable Lead Serial Number: 49791993
Implantable Lead Serial Number: 49865965
Implantable Pulse Generator Implant Date: 20180626
Lead Channel Impedance Value: 527 Ohm
Lead Channel Pacing Threshold Amplitude: 0.7 V
Lead Channel Pacing Threshold Amplitude: 0.9 V
Lead Channel Setting Pacing Amplitude: 2.4 V
Lead Channel Setting Pacing Pulse Width: 0.4 ms
MDC IDC LEAD IMPLANT DT: 20180626
MDC IDC MSMT BATTERY REMAINING PERCENTAGE: 90 %
MDC IDC MSMT LEADCHNL RA IMPEDANCE VALUE: 527 Ohm
MDC IDC MSMT LEADCHNL RA PACING THRESHOLD PULSEWIDTH: 0.4 ms
MDC IDC MSMT LEADCHNL RV PACING THRESHOLD PULSEWIDTH: 0.4 ms
MDC IDC SET LEADCHNL RA PACING AMPLITUDE: 2 V
MDC IDC STAT BRADY AP VS PERCENT: 89 %
MDC IDC STAT BRADY RA PERCENT PACED: 89 %
MDC IDC STAT BRADY RV PERCENT PACED: 0 %
Pulse Gen Model: 407145
Pulse Gen Serial Number: 69052081

## 2017-07-30 NOTE — Progress Notes (Signed)
Remote pacemaker transmission.   

## 2017-07-31 ENCOUNTER — Encounter: Payer: Self-pay | Admitting: Cardiology

## 2017-09-14 ENCOUNTER — Other Ambulatory Visit: Payer: Self-pay | Admitting: Cardiology

## 2017-09-25 DIAGNOSIS — K219 Gastro-esophageal reflux disease without esophagitis: Secondary | ICD-10-CM | POA: Diagnosis not present

## 2017-09-25 DIAGNOSIS — E871 Hypo-osmolality and hyponatremia: Secondary | ICD-10-CM | POA: Diagnosis not present

## 2017-09-25 DIAGNOSIS — E039 Hypothyroidism, unspecified: Secondary | ICD-10-CM | POA: Diagnosis not present

## 2017-09-25 DIAGNOSIS — E782 Mixed hyperlipidemia: Secondary | ICD-10-CM | POA: Diagnosis not present

## 2017-09-25 DIAGNOSIS — I10 Essential (primary) hypertension: Secondary | ICD-10-CM | POA: Diagnosis not present

## 2017-09-29 DIAGNOSIS — I10 Essential (primary) hypertension: Secondary | ICD-10-CM | POA: Diagnosis not present

## 2017-09-29 DIAGNOSIS — E039 Hypothyroidism, unspecified: Secondary | ICD-10-CM | POA: Diagnosis not present

## 2017-09-29 DIAGNOSIS — I700 Atherosclerosis of aorta: Secondary | ICD-10-CM | POA: Diagnosis not present

## 2017-09-29 DIAGNOSIS — Z950 Presence of cardiac pacemaker: Secondary | ICD-10-CM | POA: Diagnosis not present

## 2017-09-29 DIAGNOSIS — E782 Mixed hyperlipidemia: Secondary | ICD-10-CM | POA: Diagnosis not present

## 2017-09-29 DIAGNOSIS — E871 Hypo-osmolality and hyponatremia: Secondary | ICD-10-CM | POA: Diagnosis not present

## 2017-09-29 DIAGNOSIS — I2510 Atherosclerotic heart disease of native coronary artery without angina pectoris: Secondary | ICD-10-CM | POA: Diagnosis not present

## 2017-09-29 DIAGNOSIS — Z955 Presence of coronary angioplasty implant and graft: Secondary | ICD-10-CM | POA: Diagnosis not present

## 2017-10-02 DIAGNOSIS — R609 Edema, unspecified: Secondary | ICD-10-CM | POA: Diagnosis not present

## 2017-10-02 DIAGNOSIS — I5030 Unspecified diastolic (congestive) heart failure: Secondary | ICD-10-CM | POA: Diagnosis not present

## 2017-10-02 DIAGNOSIS — Z950 Presence of cardiac pacemaker: Secondary | ICD-10-CM | POA: Diagnosis not present

## 2017-10-27 DIAGNOSIS — J019 Acute sinusitis, unspecified: Secondary | ICD-10-CM | POA: Diagnosis not present

## 2017-10-27 DIAGNOSIS — Z681 Body mass index (BMI) 19 or less, adult: Secondary | ICD-10-CM | POA: Diagnosis not present

## 2017-10-29 ENCOUNTER — Ambulatory Visit (INDEPENDENT_AMBULATORY_CARE_PROVIDER_SITE_OTHER): Payer: Medicare Other | Admitting: *Deleted

## 2017-10-29 DIAGNOSIS — I495 Sick sinus syndrome: Secondary | ICD-10-CM | POA: Diagnosis not present

## 2017-10-29 NOTE — Progress Notes (Signed)
Remote pacemaker transmission.   

## 2017-10-31 ENCOUNTER — Encounter: Payer: Self-pay | Admitting: Cardiology

## 2017-11-11 DIAGNOSIS — L280 Lichen simplex chronicus: Secondary | ICD-10-CM | POA: Diagnosis not present

## 2017-11-11 DIAGNOSIS — L400 Psoriasis vulgaris: Secondary | ICD-10-CM | POA: Diagnosis not present

## 2017-11-17 DIAGNOSIS — L900 Lichen sclerosus et atrophicus: Secondary | ICD-10-CM | POA: Diagnosis not present

## 2017-11-19 ENCOUNTER — Encounter: Payer: Self-pay | Admitting: Cardiology

## 2017-11-19 DIAGNOSIS — K219 Gastro-esophageal reflux disease without esophagitis: Secondary | ICD-10-CM | POA: Diagnosis not present

## 2017-11-19 DIAGNOSIS — E559 Vitamin D deficiency, unspecified: Secondary | ICD-10-CM | POA: Diagnosis not present

## 2017-11-19 DIAGNOSIS — E871 Hypo-osmolality and hyponatremia: Secondary | ICD-10-CM | POA: Diagnosis not present

## 2017-11-19 DIAGNOSIS — Z950 Presence of cardiac pacemaker: Secondary | ICD-10-CM | POA: Diagnosis not present

## 2017-11-19 DIAGNOSIS — L900 Lichen sclerosus et atrophicus: Secondary | ICD-10-CM | POA: Diagnosis not present

## 2017-11-19 DIAGNOSIS — I5032 Chronic diastolic (congestive) heart failure: Secondary | ICD-10-CM | POA: Diagnosis not present

## 2017-11-19 DIAGNOSIS — E039 Hypothyroidism, unspecified: Secondary | ICD-10-CM | POA: Diagnosis not present

## 2017-11-19 DIAGNOSIS — E782 Mixed hyperlipidemia: Secondary | ICD-10-CM | POA: Diagnosis not present

## 2017-11-24 ENCOUNTER — Encounter (INDEPENDENT_AMBULATORY_CARE_PROVIDER_SITE_OTHER): Payer: Self-pay | Admitting: *Deleted

## 2017-11-24 DIAGNOSIS — I10 Essential (primary) hypertension: Secondary | ICD-10-CM | POA: Diagnosis not present

## 2017-11-24 DIAGNOSIS — Z23 Encounter for immunization: Secondary | ICD-10-CM | POA: Diagnosis not present

## 2017-11-24 DIAGNOSIS — Z6827 Body mass index (BMI) 27.0-27.9, adult: Secondary | ICD-10-CM | POA: Diagnosis not present

## 2017-11-24 DIAGNOSIS — Z0001 Encounter for general adult medical examination with abnormal findings: Secondary | ICD-10-CM | POA: Diagnosis not present

## 2017-11-28 LAB — CUP PACEART REMOTE DEVICE CHECK
Date Time Interrogation Session: 20191025113129
Implantable Lead Implant Date: 20180626
Implantable Lead Location: 753859
Implantable Lead Location: 753860
Implantable Lead Model: 377
Implantable Lead Serial Number: 49791993
MDC IDC LEAD IMPLANT DT: 20180626
MDC IDC LEAD SERIAL: 49865965
MDC IDC PG IMPLANT DT: 20180626
MDC IDC PG SERIAL: 69052081

## 2017-12-11 DIAGNOSIS — Z1231 Encounter for screening mammogram for malignant neoplasm of breast: Secondary | ICD-10-CM | POA: Diagnosis not present

## 2017-12-18 ENCOUNTER — Encounter (INDEPENDENT_AMBULATORY_CARE_PROVIDER_SITE_OTHER): Payer: Self-pay | Admitting: *Deleted

## 2017-12-25 ENCOUNTER — Other Ambulatory Visit (INDEPENDENT_AMBULATORY_CARE_PROVIDER_SITE_OTHER): Payer: Self-pay | Admitting: *Deleted

## 2017-12-25 DIAGNOSIS — Z1211 Encounter for screening for malignant neoplasm of colon: Secondary | ICD-10-CM

## 2017-12-25 NOTE — Progress Notes (Signed)
Cardiology Office Note  Date: 12/26/2017   ID: Amy [name], DOB [birth year], MRN [id number]  PCP: [name], MD  Primary Cardiologist: [name], MD   Chief Complaint  Patient presents with  . Coronary Artery Disease    History of Present Illness: Amy [name] is a 72 y.o. female last seen in May.  She is here for a routine follow-up visit.  She does not report any angina symptoms or unusual shortness of breath with activities.  She is exercising 4 days a week, uses an elliptical machine.  She sees Dr. Lovena Le in the device clinic, Biotronik dual-chamber pacemaker in place.  She has had no dizziness or syncope.  She states that she had lab work with Dr. Pleas Koch, has had adjustments in medications in the setting of renal insufficiency.  We are requesting the records.  Also reports having an echocardiogram done at [facility] earlier this year.  Current cardiac regimen includes aspirin, Lipitor, HCTZ, lisinopril, and Effient.  Past Medical History:  Diagnosis Date  . Asthma   . CAD (coronary artery disease)    60-70% proximal LAD/diagonal bifurcational disease October 2015 at [facility] status DES x 2 to LAD/diagonal October 2015  . Essential hypertension   . Hyperlipidemia   . Hypothyroidism     Past Surgical History:  Procedure Laterality Date  . ABDOMINAL HYSTERECTOMY    . ANAL FISSURE REPAIR    . CATARACT EXTRACTION W/PHACO Right 10/17/2014   Procedure: CATARACT EXTRACTION PHACO AND INTRAOCULAR LENS PLACEMENT ([place]);  Surgeon: Tonny Branch, MD;  Location: [facility];  Service: Ophthalmology;  Laterality: Right;  CDE: 4.88  . CATARACT EXTRACTION W/PHACO Left 10/27/2014   Procedure: CATARACT EXTRACTION PHACO AND INTRAOCULAR LENS PLACEMENT ([facility]);  Surgeon: Tonny Branch, MD;  Location: [facility];  Service: Ophthalmology;  Laterality: Left;  CDE:7.86  . FINGER SURGERY    . NASAL SINUS SURGERY    . PACEMAKER IMPLANT N/A 07/30/2016   Procedure: Pacemaker Implant  Dual Chamber;  Surgeon: Evans Lance, MD;  Location: [place] [facility];  Service: Cardiovascular;  Laterality: N/A;  . TONSILLECTOMY      Current Outpatient Medications  Medication Sig Dispense Refill  . acetaminophen (TYLENOL) 650 MG CR tablet Take 650 mg by mouth daily as needed for pain.    Marland Kitchen aspirin 81 MG tablet Take 81 mg by mouth daily.    Marland Kitchen atorvastatin (LIPITOR) 20 MG tablet Take 20 mg by mouth daily.  3  . Biotin (BIOTIN 5000) 5 MG CAPS Take 5 mg by mouth at bedtime.     . Cholecalciferol (VITAMIN D3) 2000 units capsule Take 2,000 Units by mouth at bedtime.     . clobetasol (TEMOVATE) 0.05 % external solution Apply 1 application topically daily as needed (psoriasis).     . hydrochlorothiazide (MICROZIDE) 12.5 MG capsule Take 12.5 mg by mouth daily.    Marland Kitchen levothyroxine (SYNTHROID, LEVOTHROID) 50 MCG tablet Take 50 mcg by mouth daily before breakfast.    . lisinopril (PRINIVIL,ZESTRIL) 5 MG tablet Take 0.5 tablets (2.5 mg total) by mouth daily. 45 tablet 3  . Multiple Vitamin (MULTIVITAMIN WITH MINERALS) TABS tablet Take 1 tablet by mouth at bedtime.     . prasugrel (EFFIENT) 10 MG TABS tablet Take 0.5 tablets (5 mg total) by mouth daily. 45 tablet 3   No current facility-administered medications for this visit.    Allergies:  Sulfa antibiotics   Social History: The patient  reports that she  has never smoked. She has never used smokeless tobacco. She reports that she does not drink alcohol or use drugs.   ROS:  Please see the history of present illness. Otherwise, complete review of systems is positive for none.  All other systems are reviewed and negative.   Physical Exam: VS:  BP 138/60   Pulse 70   Ht 4' 11"  (1.499 m)   Wt 143 lb 3.2 oz (65 kg)   SpO2 98%   BMI 28.92 kg/m , BMI Body mass index is 28.92 kg/m.  Wt Readings from Last 3 Encounters:  12/26/17 143 lb 3.2 oz (65 kg)  07/28/17 140 lb 3.2 oz (63.6 kg)  06/25/17 134 lb 9.6 oz (61.1 kg)    General: Elderly  woman, appears comfortable at rest. HEENT: Conjunctiva and lids normal, oropharynx clear. Neck: Supple, no elevated JVP or carotid bruits, no thyromegaly. Lungs: Clear to auscultation, nonlabored breathing at rest. Cardiac: Regular rate and rhythm, no S3, 2/6 systolic murmur. Abdomen: Soft, nontender, bowel sounds present. Extremities: No pitting edema, distal pulses 2+. Skin: Warm and dry. Musculoskeletal: No kyphosis. Neuropsychiatric: Alert and oriented x3, affect grossly appropriate.  ECG: I personally reviewed the tracing from 07/28/2017 which showed an atrial paced rhythm.  Recent Labwork:  07/26/2016: BUN 19; Creatinine, Ser 0.93; Hemoglobin 12.4; Platelets 192; Potassium 4.7; Sodium 137   Other Studies Reviewed Today:  Echocardiogram 07/12/2015: Study Conclusions  - Left ventricle: The cavity size was normal. Wall thickness was normal. Systolic function was vigorous. The estimated ejection fraction was in the range of 65% to 70%. Normal global longitudinal strain of -21.8%. Wall motion was normal; there were no regional wall motion abnormalities. Doppler parameters are consistent with abnormal left ventricular relaxation (grade 1 diastolic dysfunction). - Aortic valve: Moderately calcified annulus. Trileaflet; mildly calcified leaflets. There was trivial regurgitation. - Mitral valve: Calcified annulus. There was trivial regurgitation. - Right atrium: Central venous pressure (est): 8 mm Hg. - Tricuspid valve: There was trivial regurgitation. - Pulmonary arteries: PA peak pressure: 32 mm Hg (S). - Pericardium, extracardiac: There was no pericardial effusion.  Impressions:  - Normal LV wall thickness with LVEF 65-70%. Grade 1 diastolic dysfunction with normal estimated LV filling pressure. MAC with trivial mitral regurgitation. Sclerotic aortic valve with trivial aortic regurgitation. Trivial tricuspid regurgitation with normal estimated PASP 32  mmHg.  Assessment and Plan:  1.  CAD status post DES x2 to the LAD/diagonal bifurcation in 2015.  She remains on long-term dual antiplatelet therapy and is doing well without active angina on medical therapy.  Continue with regular exercise plan and observation.  We are requesting her interval echocardiogram done at [facility]  2.  Mixed hyperlipidemia, continues on Lipitor with follow-up per Dr. Pleas Koch.  Requesting lab work.  3.  Sinus node dysfunction status post Biotronik pacemaker.  Continues to follow with Dr. Lovena Le.  4.  Essential hypertension, blood pressure is adequately controlled today.  No changes made.  Current medicines were reviewed with the patient today.  Disposition: Follow-up in 6 months.  Signed, Satira Sark, MD, [facility] 12/26/2017 11:31 AM    [facility] [place], [place], [place] [address] Phone: [phone]; Fax: [phone]

## 2017-12-26 ENCOUNTER — Encounter: Payer: Self-pay | Admitting: *Deleted

## 2017-12-26 ENCOUNTER — Ambulatory Visit (INDEPENDENT_AMBULATORY_CARE_PROVIDER_SITE_OTHER): Payer: Medicare Other | Admitting: Cardiology

## 2017-12-26 ENCOUNTER — Encounter: Payer: Self-pay | Admitting: Cardiology

## 2017-12-26 VITALS — BP 138/60 | HR 70 | Ht 59.0 in | Wt 143.2 lb

## 2017-12-26 DIAGNOSIS — I10 Essential (primary) hypertension: Secondary | ICD-10-CM | POA: Diagnosis not present

## 2017-12-26 DIAGNOSIS — Z950 Presence of cardiac pacemaker: Secondary | ICD-10-CM

## 2017-12-26 DIAGNOSIS — I495 Sick sinus syndrome: Secondary | ICD-10-CM

## 2017-12-26 DIAGNOSIS — E782 Mixed hyperlipidemia: Secondary | ICD-10-CM | POA: Diagnosis not present

## 2017-12-26 DIAGNOSIS — I25119 Atherosclerotic heart disease of native coronary artery with unspecified angina pectoris: Secondary | ICD-10-CM | POA: Diagnosis not present

## 2017-12-26 NOTE — Patient Instructions (Addendum)

## 2018-01-05 DIAGNOSIS — J209 Acute bronchitis, unspecified: Secondary | ICD-10-CM | POA: Diagnosis not present

## 2018-01-05 DIAGNOSIS — E039 Hypothyroidism, unspecified: Secondary | ICD-10-CM | POA: Diagnosis not present

## 2018-01-05 DIAGNOSIS — Z6827 Body mass index (BMI) 27.0-27.9, adult: Secondary | ICD-10-CM | POA: Diagnosis not present

## 2018-01-05 DIAGNOSIS — I10 Essential (primary) hypertension: Secondary | ICD-10-CM | POA: Diagnosis not present

## 2018-01-12 IMAGING — CR DG CHEST 2V
2 series · 2 of 2 positions shown · non-contrast
Comparison: 01/30/2013 .

CLINICAL DATA: Soreness.  Pacemaker .

EXAM:
CHEST  2 VIEW

[chest pa]
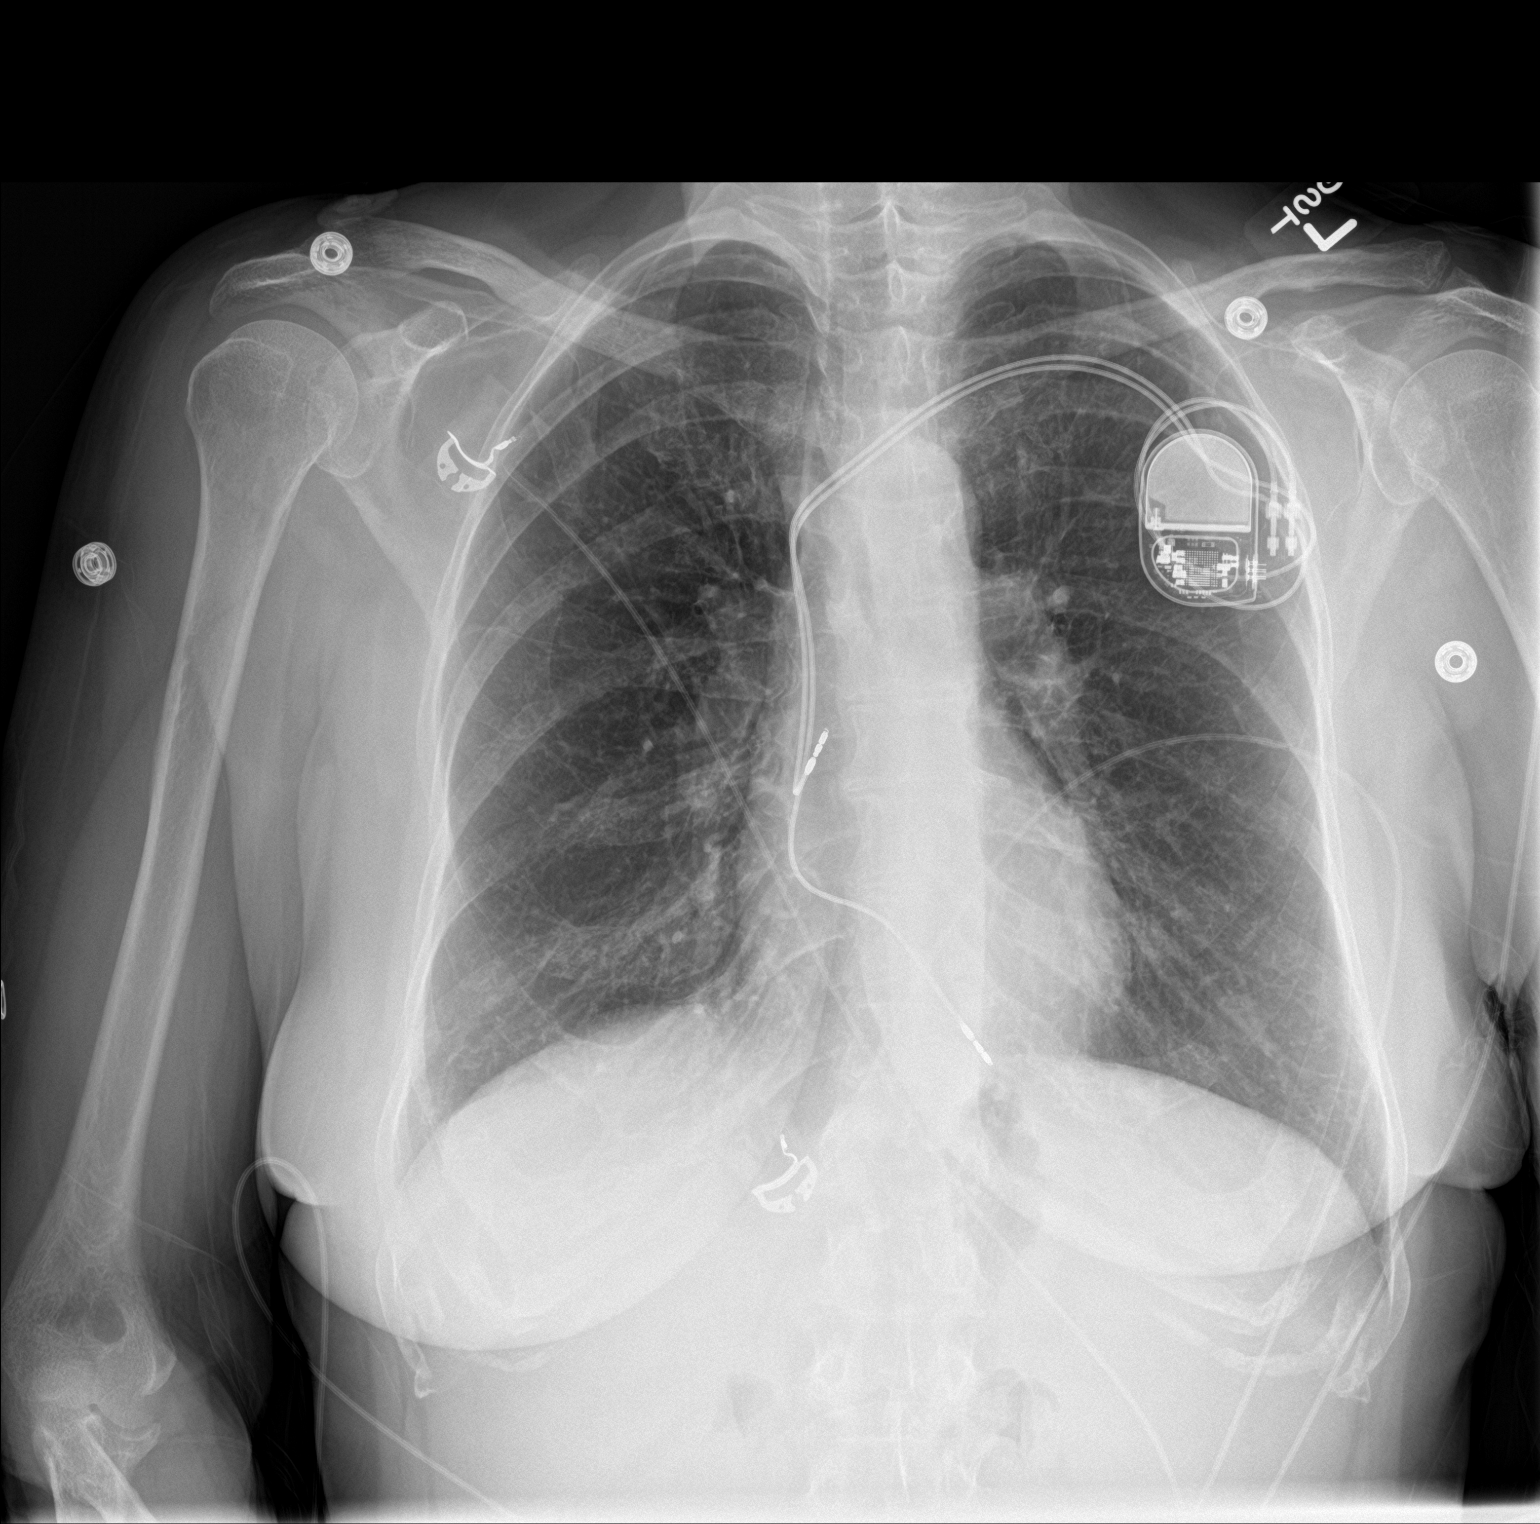

[chest lat]
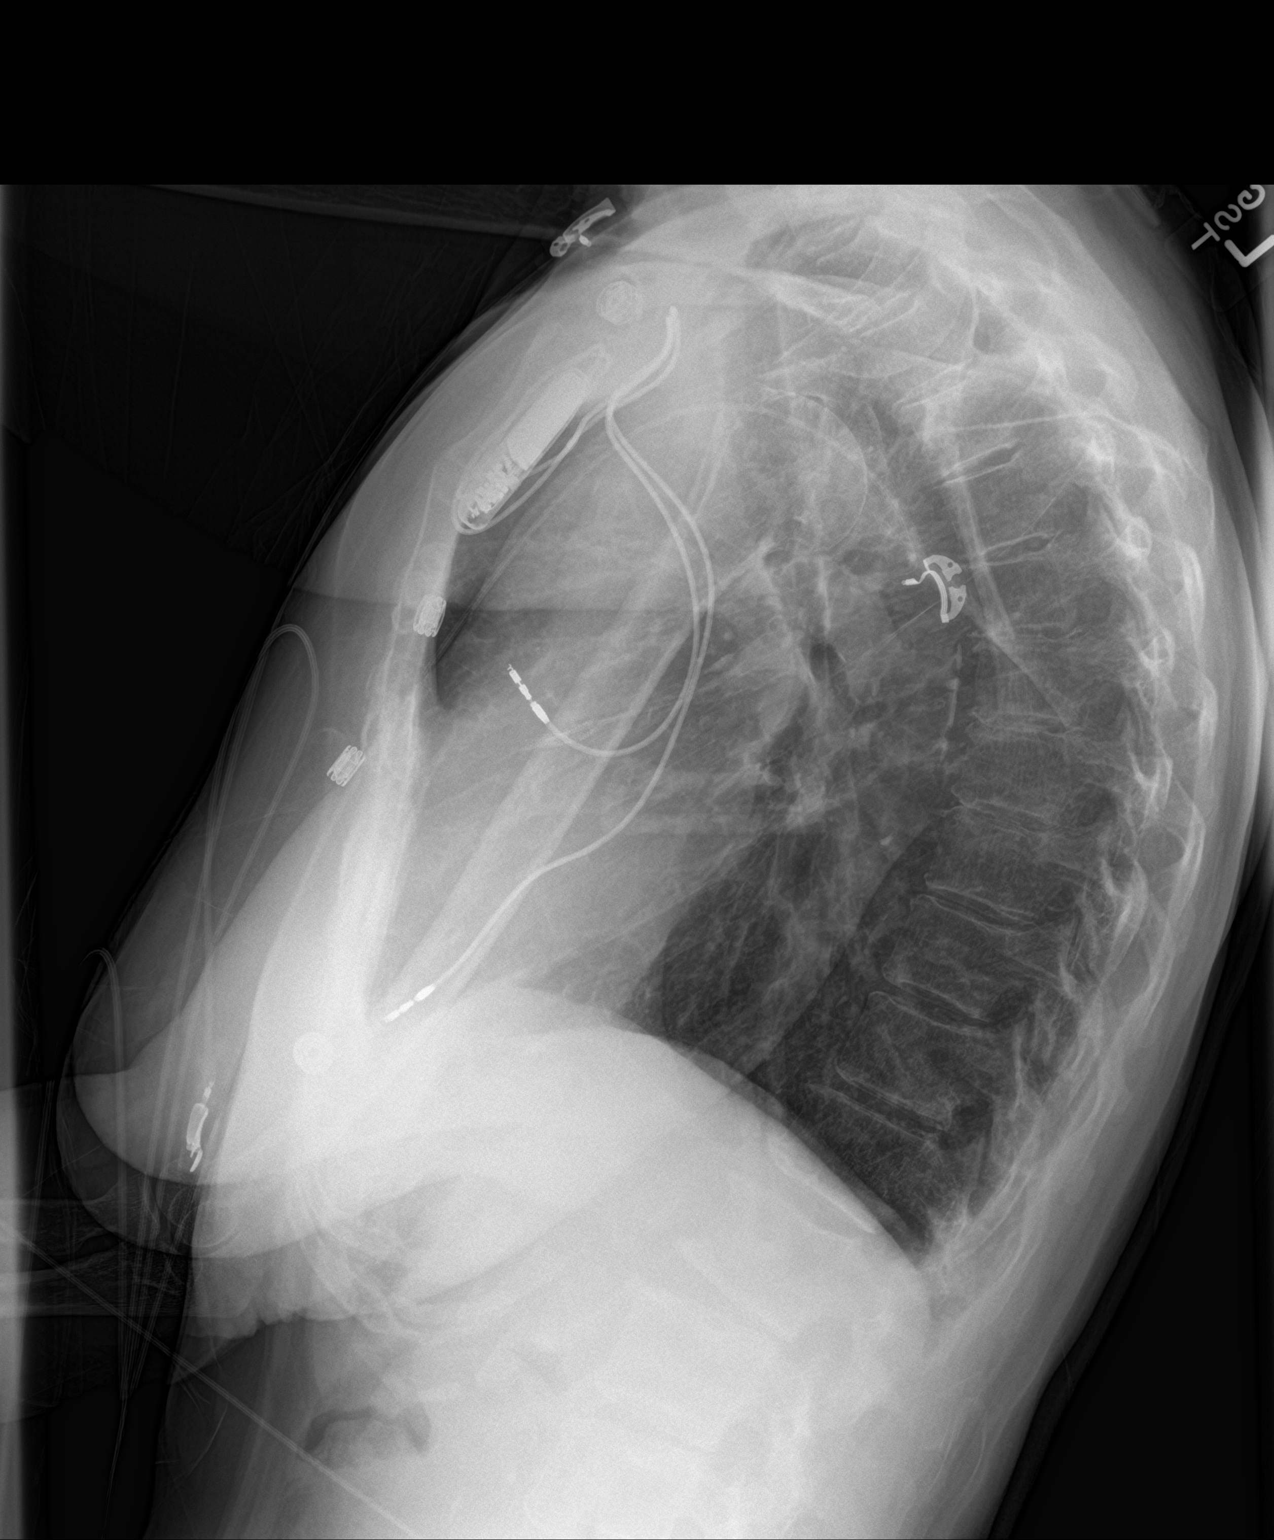

[2 of 2 positions shown; findings below may reference images not displayed]

FINDINGS: Cardiac pacer with lead tips in right atrium right ventricle. Heart
size normal. No focal infiltrate. No pleural effusion or
pneumothorax. No acute bony abnormality.
IMPRESSION: 1. Cardiac pacer with lead tips in right atrium and right ventricle.

2. No acute pulmonary disease.

## 2018-01-14 DIAGNOSIS — H5203 Hypermetropia, bilateral: Secondary | ICD-10-CM | POA: Diagnosis not present

## 2018-01-14 DIAGNOSIS — Z961 Presence of intraocular lens: Secondary | ICD-10-CM | POA: Diagnosis not present

## 2018-01-14 DIAGNOSIS — H26493 Other secondary cataract, bilateral: Secondary | ICD-10-CM | POA: Diagnosis not present

## 2018-01-14 DIAGNOSIS — H43811 Vitreous degeneration, right eye: Secondary | ICD-10-CM | POA: Diagnosis not present

## 2018-01-29 ENCOUNTER — Ambulatory Visit (INDEPENDENT_AMBULATORY_CARE_PROVIDER_SITE_OTHER): Payer: Medicare Other

## 2018-01-29 DIAGNOSIS — I495 Sick sinus syndrome: Secondary | ICD-10-CM | POA: Diagnosis not present

## 2018-01-29 DIAGNOSIS — R55 Syncope and collapse: Secondary | ICD-10-CM

## 2018-01-29 NOTE — Progress Notes (Signed)
Remote pacemaker transmission.   

## 2018-02-01 LAB — CUP PACEART REMOTE DEVICE CHECK
Implantable Lead Implant Date: 20180626
Implantable Lead Location: 753859
Implantable Lead Location: 753860
Implantable Lead Model: 377
Implantable Lead Serial Number: 49791993
Implantable Lead Serial Number: 49865965
MDC IDC LEAD IMPLANT DT: 20180626
MDC IDC PG IMPLANT DT: 20180626
MDC IDC PG SERIAL: 69052081
MDC IDC SESS DTM: 20191229184106

## 2018-02-03 ENCOUNTER — Encounter: Payer: Self-pay | Admitting: Cardiology

## 2018-02-09 DIAGNOSIS — I10 Essential (primary) hypertension: Secondary | ICD-10-CM | POA: Diagnosis not present

## 2018-02-09 DIAGNOSIS — J209 Acute bronchitis, unspecified: Secondary | ICD-10-CM | POA: Diagnosis not present

## 2018-02-09 DIAGNOSIS — Z6828 Body mass index (BMI) 28.0-28.9, adult: Secondary | ICD-10-CM | POA: Diagnosis not present

## 2018-03-09 ENCOUNTER — Telehealth (INDEPENDENT_AMBULATORY_CARE_PROVIDER_SITE_OTHER): Payer: Self-pay | Admitting: *Deleted

## 2018-03-09 ENCOUNTER — Encounter (INDEPENDENT_AMBULATORY_CARE_PROVIDER_SITE_OTHER): Payer: Self-pay | Admitting: *Deleted

## 2018-03-09 NOTE — Telephone Encounter (Signed)
Patient needs trilyte 

## 2018-03-09 NOTE — Telephone Encounter (Signed)
Patient is scheduled for 10 yr colonoscopy on 04/16/18 -- she needs to stop Effient & ASA 2 days prior -- please advise if ok to stop

## 2018-03-10 ENCOUNTER — Other Ambulatory Visit: Payer: Self-pay | Admitting: Cardiology

## 2018-03-10 NOTE — Telephone Encounter (Signed)
Typically, Effient (prasugrel) would be stopped 7 days prior to a planned surgery, so if biopsies are being considered with colonoscopy, would keep this in mind.

## 2018-03-10 NOTE — Telephone Encounter (Signed)
Forwarded to Dr Laural Golden for review, as we typically hold for 2

## 2018-03-12 MED ORDER — PEG 3350-KCL-NA BICARB-NACL 420 G PO SOLR: 4000.0000 mL | Freq: Once | ORAL | 0 refills | Status: AC

## 2018-03-16 ENCOUNTER — Encounter (INDEPENDENT_AMBULATORY_CARE_PROVIDER_SITE_OTHER): Payer: Self-pay | Admitting: *Deleted

## 2018-03-16 NOTE — Telephone Encounter (Signed)
He has or we would follow cardiology recommendations.

## 2018-03-16 NOTE — Telephone Encounter (Signed)
Patient aware.

## 2018-03-20 ENCOUNTER — Telehealth (INDEPENDENT_AMBULATORY_CARE_PROVIDER_SITE_OTHER): Payer: Self-pay | Admitting: *Deleted

## 2018-03-20 NOTE — Telephone Encounter (Signed)
Referring MD/PCP: burdine   Procedure: tcs  Reason/Indication:  screening  Has patient had this procedure before?  Yes, 2008  If so, when, by whom and where?    Is there a family history of colon cancer?  no  Who?  What age when diagnosed?    Is patient diabetic?   no      Does patient have prosthetic heart valve or mechanical valve?  no  Do you have a pacemaker?  no  Has patient ever had endocarditis? no  Has patient had joint replacement within last 12 months?  no  Is patient constipated or do they take laxatives? no  Does patient have a history of alcohol/drug use?  no  Is patient on blood thinner such as Coumadin, Plavix and/or Aspirin? yes  Medications: effient 5 mg daily, atorvastatin 20 mg daily, asa 81 mg daily, prasugrel 10 mg 1/2 tab daily, hctz 12.5 mg daily, levothyroxine 50 mcg daily, centrum sivler daily, vit d3 daily, biotin 5000 mg daily  Allergies: sulfur  Medication Adjustment per Dr Lindi Adie, NP: Effient 7 days per cardiologist see 2/4 note  Procedure date & time: 04/16/18 at 830

## 2018-03-23 NOTE — Telephone Encounter (Signed)
Needs to hold asa too

## 2018-04-16 ENCOUNTER — Encounter (HOSPITAL_COMMUNITY): Payer: Self-pay | Admitting: *Deleted

## 2018-04-16 ENCOUNTER — Ambulatory Visit (HOSPITAL_COMMUNITY)
Admission: RE | Admit: 2018-04-16 | Discharge: 2018-04-16 | Disposition: A | Discharge status: Home / Self Care | Payer: Medicare Other | Attending: Internal Medicine | Admitting: Internal Medicine

## 2018-04-16 ENCOUNTER — Encounter (HOSPITAL_COMMUNITY)
Admission: RE | Disposition: A | Discharge status: Home / Self Care | Payer: Self-pay | Source: Home / Self Care | Attending: Internal Medicine

## 2018-04-16 ENCOUNTER — Other Ambulatory Visit: Payer: Self-pay

## 2018-04-16 DIAGNOSIS — K648 Other hemorrhoids: Secondary | ICD-10-CM | POA: Diagnosis not present

## 2018-04-16 DIAGNOSIS — Z79899 Other long term (current) drug therapy: Secondary | ICD-10-CM | POA: Insufficient documentation

## 2018-04-16 DIAGNOSIS — K5730 Diverticulosis of large intestine without perforation or abscess without bleeding: Secondary | ICD-10-CM | POA: Insufficient documentation

## 2018-04-16 DIAGNOSIS — Z79890 Hormone replacement therapy (postmenopausal): Secondary | ICD-10-CM | POA: Diagnosis not present

## 2018-04-16 DIAGNOSIS — Z955 Presence of coronary angioplasty implant and graft: Secondary | ICD-10-CM | POA: Insufficient documentation

## 2018-04-16 DIAGNOSIS — E785 Hyperlipidemia, unspecified: Secondary | ICD-10-CM | POA: Insufficient documentation

## 2018-04-16 DIAGNOSIS — Z1211 Encounter for screening for malignant neoplasm of colon: Secondary | ICD-10-CM | POA: Diagnosis not present

## 2018-04-16 DIAGNOSIS — I10 Essential (primary) hypertension: Secondary | ICD-10-CM | POA: Diagnosis not present

## 2018-04-16 DIAGNOSIS — E039 Hypothyroidism, unspecified: Secondary | ICD-10-CM | POA: Diagnosis not present

## 2018-04-16 DIAGNOSIS — Z7982 Long term (current) use of aspirin: Secondary | ICD-10-CM | POA: Insufficient documentation

## 2018-04-16 DIAGNOSIS — I2510 Atherosclerotic heart disease of native coronary artery without angina pectoris: Secondary | ICD-10-CM | POA: Diagnosis not present

## 2018-04-16 HISTORY — PX: COLONOSCOPY: SHX5424

## 2018-04-16 SURGERY — COLONOSCOPY
Anesthesia: Moderate Sedation

## 2018-04-16 MED ORDER — MEPERIDINE HCL 50 MG/ML IJ SOLN
INTRAMUSCULAR | Status: DC | PRN
  Administered 2018-04-16 (×2): 25 mg

## 2018-04-16 MED ORDER — SODIUM CHLORIDE 0.9 % IV SOLN
INTRAVENOUS | Status: DC
  Administered 2018-04-16: 08:00:00 via INTRAVENOUS

## 2018-04-16 MED ORDER — MIDAZOLAM HCL 5 MG/5ML IJ SOLN
INTRAMUSCULAR | Status: DC | PRN
  Administered 2018-04-16 (×3): 2 mg via INTRAVENOUS

## 2018-04-16 MED ORDER — MIDAZOLAM HCL 5 MG/5ML IJ SOLN
INTRAMUSCULAR | Status: AC
  Filled 2018-04-16: qty 10

## 2018-04-16 MED ORDER — MEPERIDINE HCL 50 MG/ML IJ SOLN
INTRAMUSCULAR | Status: AC
  Filled 2018-04-16: qty 1

## 2018-04-16 NOTE — Op Note (Signed)
[facility] Patient Name: Amy [name] Procedure Date: 04/16/2018 8:02 AM MRN: [id number] Date of Birth: 1945-09-15 Attending MD: Hildred Laser , MD CSN: [id number] Age: 73 Admit Type: Outpatient Procedure:                Colonoscopy Indications:              Screening for colorectal malignant neoplasm Providers:                Hildred Laser, MD, Janeece Riggers, RN, Aram Candela Referring MD:             [name], MD Medicines:                Meperidine 50 mg IV, Midazolam 6 mg IV Complications:            No immediate complications. Estimated Blood Loss:     Estimated blood loss: none. Procedure:                Pre-Anesthesia Assessment:                           - Prior to the procedure, a History and Physical                            was performed, and patient medications and                            allergies were reviewed. The patient's tolerance of                            previous anesthesia was also reviewed. The risks                            and benefits of the procedure and the sedation                            options and risks were discussed with the patient.                            All questions were answered, and informed consent                            was obtained. Prior Anticoagulants: The patient                            last took aspirin 3 days and Effient (prasugrel) 7                            days prior to the procedure. ASA Grade Assessment:                            III - A patient with severe systemic disease. After                            reviewing the risks and benefits, the patient was  deemed in satisfactory condition to undergo the                            procedure.                           After obtaining informed consent, the colonoscope                            was passed under direct vision. Throughout the                            procedure, the patient's blood pressure, pulse, and                           oxygen saturations were monitored continuously. The                            PCF-[id number] ([id number]) was introduced through the                            anus and advanced to the the cecum, identified by                            appendiceal orifice and ileocecal valve. The                            colonoscopy was performed without difficulty. The                            patient tolerated the procedure well. The quality                            of the bowel preparation was good. The ileocecal                            valve, appendiceal orifice, and rectum were                            photographed. Scope In: 9:06:43 AM Scope Out: 9:26:54 AM Scope Withdrawal Time: 0 hours 11 minutes 29 seconds  Total Procedure Duration: 0 hours 20 minutes 11 seconds  Findings:      The perianal and digital rectal examinations were normal.      A few medium-mouthed diverticula were found in the sigmoid colon.      Internal hemorrhoids were found during retroflexion. The hemorrhoids       were small. Impression:               - Diverticulosis in the sigmoid colon.                           - Internal hemorrhoids.                           - No specimens collected. Moderate Sedation:      Moderate (conscious) sedation  was administered by the endoscopy nurse       and supervised by the endoscopist. The following parameters were       monitored: oxygen saturation, heart rate, blood pressure, CO2       capnography and response to care. Total physician intraservice time was       25 minutes. Recommendation:           - Patient has a contact number available for                            emergencies. The signs and symptoms of potential                            delayed complications were discussed with the                            patient. Return to normal activities tomorrow.                            Written discharge instructions were provided to the                             patient.                           - High fiber diet today.                           - Continue present medications.                           - Resume aspirin today and Effient (prasugrel)                            today at prior doses.                           - No repeat colonoscopy due to age and the absence                            of advanced adenomas. Procedure Code(s):        --- Professional ---                           [phone], Colonoscopy, flexible; diagnostic, including                            collection of specimen(s) by brushing or washing,                            when performed (separate procedure)                           99153, Moderate sedation; each additional 15                            minutes  intraservice time                           G0500, Moderate sedation services provided by the                            same physician or other qualified health care                            professional performing a gastrointestinal                            endoscopic service that sedation supports,                            requiring the presence of an independent trained                            observer to assist in the monitoring of the                            patient's level of consciousness and physiological                            status; initial 15 minutes of intra-service time;                            patient age 70 years or older (additional time may                            be reported with [phone], as appropriate) Diagnosis Code(s):        --- Professional ---                           Z12.11, Encounter for screening for malignant                            neoplasm of colon                           K64.8, Other hemorrhoids                           K57.30, Diverticulosis of large intestine without                            perforation or abscess without bleeding CPT copyright 2018 [employer]. All rights  reserved. The codes documented in this report are preliminary and upon coder review may  be revised to meet current compliance requirements. Hildred Laser, MD Hildred Laser, MD 04/16/2018 9:31:20 AM This report has been signed electronically. Number of Addenda: 0

## 2018-04-16 NOTE — H&P (Signed)
Amy [name] is an 73 y.o. female.   Chief Complaint: Patient is here for colonoscopy. HPI: Patient is 73 year old Caucasian female who is here for screening colonoscopy.  She denies abdominal pain change in bowel habits or frank rectal bleeding.  When he time she sees blood is when she is constipated. Family history is negative for CRC.    Past Medical History:  Diagnosis Date  . Asthma   . CAD (coronary artery disease)    60-70% proximal LAD/diagonal bifurcational disease October 2015 at [facility] status DES x 2 to LAD/diagonal October 2015  . Essential hypertension   . Hyperlipidemia   . Hypothyroidism     Past Surgical History:  Procedure Laterality Date  . ABDOMINAL HYSTERECTOMY    . ANAL FISSURE REPAIR    . CATARACT EXTRACTION W/PHACO Right 10/17/2014   Procedure: CATARACT EXTRACTION PHACO AND INTRAOCULAR LENS PLACEMENT ([place]);  Surgeon: Tonny Branch, MD;  Location: [facility];  Service: Ophthalmology;  Laterality: Right;  CDE: 4.88  . CATARACT EXTRACTION W/PHACO Left 10/27/2014   Procedure: CATARACT EXTRACTION PHACO AND INTRAOCULAR LENS PLACEMENT ([facility]);  Surgeon: Tonny Branch, MD;  Location: [facility];  Service: Ophthalmology;  Laterality: Left;  CDE:7.86  . FINGER SURGERY    . NASAL SINUS SURGERY    . PACEMAKER IMPLANT N/A 07/30/2016   Procedure: Pacemaker Implant Dual Chamber;  Surgeon: Evans Lance, MD;  Location: [facility];  Service: Cardiovascular;  Laterality: N/A;  . TONSILLECTOMY      Family History  Problem Relation Age of Onset  . Heart disease Father    Social History:  reports that she has never smoked. She has never used smokeless tobacco. She reports that she does not drink alcohol or use drugs.  Allergies:  Allergies  Allergen Reactions  . Sulfa Antibiotics Rash    Medications Prior to Admission  Medication Sig Dispense Refill  . acetaminophen (TYLENOL) 650 MG CR tablet Take 650 mg by mouth daily as needed for pain.    Marland Kitchen aspirin 81 MG tablet Take 81 mg  by mouth daily.    Marland Kitchen atorvastatin (LIPITOR) 20 MG tablet Take 20 mg by mouth at bedtime.   3  . benzonatate (TESSALON) 100 MG capsule Take 100 mg by mouth 3 (three) times daily as needed for cough.    . Biotin (BIOTIN 5000) 5 MG CAPS Take 5 mg by mouth daily at 12 noon.     . Cholecalciferol (VITAMIN D3) 2000 units capsule Take 2,000 Units by mouth daily at 12 noon.     . clobetasol (TEMOVATE) 0.05 % external solution Apply 1 application topically daily as needed (psoriasis).     . fexofenadine (ALLEGRA) 180 MG tablet Take 180 mg by mouth at bedtime.    Marland Kitchen guaiFENesin (MUCINEX) 600 MG 12 hr tablet Take 600 mg by mouth 2 (two) times daily as needed for cough.    . hydrochlorothiazide (MICROZIDE) 12.5 MG capsule Take 12.5 mg by mouth daily.    Marland Kitchen levothyroxine (SYNTHROID, LEVOTHROID) 50 MCG tablet Take 50 mcg by mouth daily before breakfast.    . lisinopril (PRINIVIL,ZESTRIL) 5 MG tablet TAKE 1/2 (ONE-HALF) TABLET BY MOUTH ONCE DAILY (Patient taking differently: Take 2.5 mg by mouth every evening. ) 45 tablet 2  . Multiple Vitamin (MULTIVITAMIN WITH MINERALS) TABS tablet Take 1 tablet by mouth daily at 12 noon.     . prasugrel (EFFIENT) 10 MG TABS tablet TAKE 1/2 (ONE-HALF) TABLET BY MOUTH ONCE DAILY (Patient taking differently: Take  5 mg by mouth daily. ) 45 each 2  . triamcinolone cream (KENALOG) 0.1 % Apply 1 application topically daily as needed (psoriasis). Mixed with nystatin cream 1:1    . vitamin C (ASCORBIC ACID) 500 MG tablet Take 500 mg by mouth daily at 12 noon.      No results found for this or any previous visit (from the past 48 hour(s)). No results found.  ROS  Blood pressure (!) 195/66, pulse 75, temperature 97.8 F (36.6 C), temperature source Oral, resp. rate 15, height 4' 11.5" (1.511 m), weight 64.9 kg, SpO2 100 %. Physical Exam  Constitutional: She appears well-developed and well-nourished.  HENT:  Mouth/Throat: Oropharynx is clear and moist.  Eyes: Conjunctivae are  normal. No scleral icterus.  Neck: No thyromegaly present.  Cardiovascular: Normal rate, regular rhythm and normal heart sounds.  No murmur heard. Respiratory: Effort normal and breath sounds normal.  Pacemaker in left pectoral region  GI: Soft. She exhibits no distension and no mass. There is no abdominal tenderness.  Lower midline scar  Musculoskeletal:        General: No edema.  Lymphadenopathy:    She has no cervical adenopathy.  Neurological: She is alert.  Skin: Skin is warm and dry.     Assessment/Plan: Average risk screening colonoscopy  Hildred Laser, MD 04/16/2018, 8:51 AM

## 2018-04-22 ENCOUNTER — Encounter (HOSPITAL_COMMUNITY): Payer: Self-pay | Admitting: Internal Medicine

## 2018-04-30 ENCOUNTER — Ambulatory Visit (INDEPENDENT_AMBULATORY_CARE_PROVIDER_SITE_OTHER): Payer: Medicare Other | Admitting: *Deleted

## 2018-04-30 ENCOUNTER — Other Ambulatory Visit: Payer: Self-pay

## 2018-04-30 DIAGNOSIS — I495 Sick sinus syndrome: Secondary | ICD-10-CM

## 2018-04-30 LAB — CUP PACEART REMOTE DEVICE CHECK
Date Time Interrogation Session: 20200326160043
Implantable Lead Implant Date: 20180626
Implantable Lead Location: 753859
Implantable Lead Location: 753860
Implantable Lead Model: 377
Implantable Lead Model: 377
Implantable Lead Serial Number: 49791993
Implantable Lead Serial Number: 49865965
Implantable Pulse Generator Implant Date: 20180626
MDC IDC LEAD IMPLANT DT: 20180626
Pulse Gen Model: 407145
Pulse Gen Serial Number: 69052081

## 2018-05-07 ENCOUNTER — Encounter: Payer: Self-pay | Admitting: Cardiology

## 2018-05-07 NOTE — Progress Notes (Signed)
Remote pacemaker transmission.   

## 2018-06-23 ENCOUNTER — Telehealth: Payer: Self-pay | Admitting: Cardiology

## 2018-06-23 NOTE — Telephone Encounter (Signed)
Virtual Visit Pre-Appointment Phone Call  "(Name), I am calling you today to discuss your upcoming appointment. We are currently trying to limit exposure to the virus that causes COVID-19 by seeing patients at home rather than in the office."  1. "What is the BEST phone number to call the day of the visit?" - include this in appointment notes  2. Do you have or have access to (through a family member/friend) a smartphone with video capability that we can use for your visit?" a. If yes - list this number in appt notes as cell (if different from BEST phone #) and list the appointment type as a VIDEO visit in appointment notes b. If no - list the appointment type as a PHONE visit in appointment notes  3. Confirm consent - "In the setting of the current Covid19 crisis, you are scheduled for a (phone or video) visit with your provider on (date) at (time).  Just as we do with many in-office visits, in order for you to participate in this visit, we must obtain consent.  If you'd like, I can send this to your mychart (if signed up) or email for you to review.  Otherwise, I can obtain your verbal consent now.  All virtual visits are billed to your insurance company just like a normal visit would be.  By agreeing to a virtual visit, we'd like you to understand that the technology does not allow for your provider to perform an examination, and thus may limit your provider's ability to fully assess your condition. If your provider identifies any concerns that need to be evaluated in person, we will make arrangements to do so.  Finally, though the technology is pretty good, we cannot assure that it will always work on either your or our end, and in the setting of a video visit, we may have to convert it to a phone-only visit.  In either situation, we cannot ensure that we have a secure connection.  Are you willing to proceed?" STAFF: Did the patient verbally acknowledge consent to telehealth visit? Document  YES/NO here: Yes  4. Advise patient to be prepared - "Two hours prior to your appointment, go ahead and check your blood pressure, pulse, oxygen saturation, and your weight (if you have the equipment to check those) and write them all down. When your visit starts, your provider will ask you for this information. If you have an Apple Watch or Kardia device, please plan to have heart rate information ready on the day of your appointment. Please have a pen and paper handy nearby the day of the visit as well."  5. Give patient instructions for MyChart download to smartphone OR Doximity/Doxy.me as below if video visit (depending on what platform provider is using)  6. Inform patient they will receive a phone call 15 minutes prior to their appointment time (may be from unknown caller ID) so they should be prepared to answer    TELEPHONE CALL NOTE  Amy [name] has been deemed a candidate for a follow-up tele-health visit to limit community exposure during the Covid-19 pandemic. I spoke with the patient via phone to ensure availability of phone/video source, confirm preferred email & phone number, and discuss instructions and expectations.  I reminded Amy [name] to be prepared with any vital sign and/or heart rhythm information that could potentially be obtained via home monitoring, at the time of her visit. I reminded Amy [name] to expect a phone call prior to  her visit.  [name] 06/23/2018 4:13 PM   INSTRUCTIONS FOR DOWNLOADING THE MYCHART APP TO SMARTPHONE  - The patient must first make sure to have activated MyChart and know their login information - If Apple, go to [employer] and type in MyChart in the search bar and download the app. If Android, ask patient to go to [employer] and type in [place] in the search bar and download the app. The app is free but as with any other app downloads, their phone may require them to verify saved payment information or Apple/Android  password.  - The patient will need to then log into the app with their MyChart username and password, and select [facility] as their healthcare provider to link the account. When it is time for your visit, go to the MyChart app, find appointments, and click Begin Video Visit. Be sure to Select Allow for your device to access the Microphone and Camera for your visit. You will then be connected, and your provider will be with you shortly.  **If they have any issues connecting, or need assistance please contact MyChart service desk [phone])**  **If using a computer, in order to ensure the best quality for their visit they will need to use either of the following Internet Browsers: [employer], or Google Chrome**  IF USING DOXIMITY or DOXY.ME - The patient will receive a link just prior to their visit by text.     FULL LENGTH CONSENT FOR TELE-HEALTH VISIT   I hereby voluntarily request, consent and authorize [facility] and its employed or contracted physicians, physician assistants, nurse practitioners or other licensed health care professionals (the Practitioner), to provide me with telemedicine health care services (the Services") as deemed necessary by the treating Practitioner. I acknowledge and consent to receive the Services by the Practitioner via telemedicine. I understand that the telemedicine visit will involve communicating with the Practitioner through live audiovisual communication technology and the disclosure of certain medical information by electronic transmission. I acknowledge that I have been given the opportunity to request an in-person assessment or other available alternative prior to the telemedicine visit and am voluntarily participating in the telemedicine visit.  I understand that I have the right to withhold or withdraw my consent to the use of telemedicine in the course of my care at any time, without affecting my right to future care or treatment,  and that the Practitioner or I may terminate the telemedicine visit at any time. I understand that I have the right to inspect all information obtained and/or recorded in the course of the telemedicine visit and may receive copies of available information for a reasonable fee.  I understand that some of the potential risks of receiving the Services via telemedicine include:   Delay or interruption in medical evaluation due to technological equipment failure or disruption;  Information transmitted may not be sufficient (e.g. poor resolution of images) to allow for appropriate medical decision making by the Practitioner; and/or   In rare instances, security protocols could fail, causing a breach of personal health information.  Furthermore, I acknowledge that it is my responsibility to provide information about my medical history, conditions and care that is complete and accurate to the best of my ability. I acknowledge that Practitioner's advice, recommendations, and/or decision may be based on factors not within their control, such as incomplete or inaccurate data provided by me or distortions of diagnostic images or specimens that may result from electronic transmissions. I  understand that the practice of medicine is not an exact science and that Practitioner makes no warranties or guarantees regarding treatment outcomes. I acknowledge that I will receive a copy of this consent concurrently upon execution via email to the email address I last provided but may also request a printed copy by calling the office of [facility].    I understand that my insurance will be billed for this visit.   I have read or had this consent read to me.  I understand the contents of this consent, which adequately explains the benefits and risks of the Services being provided via telemedicine.   I have been provided ample opportunity to ask questions regarding this consent and the Services and have had my questions  answered to my satisfaction.  I give my informed consent for the services to be provided through the use of telemedicine in my medical care  By participating in this telemedicine visit I agree to the above.

## 2018-06-24 NOTE — Progress Notes (Signed)
Virtual Visit via Telephone Note   This visit type was conducted due to national recommendations for restrictions regarding the COVID-19 Pandemic (e.g. social distancing) in an effort to limit this patient's exposure and mitigate transmission in our community.  Due to her co-morbid illnesses, this patient is at least at moderate risk for complications without adequate follow up.  This format is felt to be most appropriate for this patient at this time.  The patient did not have access to video technology/had technical difficulties with video requiring transitioning to audio format only (telephone).  All issues noted in this document were discussed and addressed.  No physical exam could be performed with this format.  Please refer to the patient's chart for her  consent to telehealth for [facility].   Date:  06/26/2018   ID:  Amy [name], DOB [birth year], MRN [id number]  Patient Location: Home Provider Location: Office  PCP:  [name], MD  Cardiologist:  [name], MD Electrophysiologist:  Cristopher Peru, MD   Evaluation Performed:  Follow-Up Visit  Chief Complaint:   Cardiac follow-up  History of Present Illness:    Amy [name] is a 73 y.o. female last seen in November 2019.  DO access and we spoke by phone today.  She tells me that she has been doing reasonably well from a cardiac perspective, no active angina symptoms on medical therapy.  She has been staying around her house mostly, rarely goes out with her daughter.  She sees Dr. Lovena Le in the device clinic, Biotronik dual-chamber pacemaker in place.  Follow-up echocardiogram from August 2019 is outlined below.  I reviewed her medications which are listed below and stable from a cardiac perspective.  She does not report any bleeding problems on dual antiplatelet therapy.  The patient does not have symptoms concerning for COVID-19 infection (fever, chills, cough, or new shortness of breath).    Past Medical  History:  Diagnosis Date  . Asthma   . CAD (coronary artery disease)    60-70% proximal LAD/diagonal bifurcational disease October 2015 at [facility] status DES x 2 to LAD/diagonal October 2015  . Essential hypertension   . Hyperlipidemia   . Hypothyroidism    Past Surgical History:  Procedure Laterality Date  . ABDOMINAL HYSTERECTOMY    . ANAL FISSURE REPAIR    . CATARACT EXTRACTION W/PHACO Right 10/17/2014   Procedure: CATARACT EXTRACTION PHACO AND INTRAOCULAR LENS PLACEMENT ([place]);  Surgeon: Tonny Branch, MD;  Location: [facility];  Service: Ophthalmology;  Laterality: Right;  CDE: 4.88  . CATARACT EXTRACTION W/PHACO Left 10/27/2014   Procedure: CATARACT EXTRACTION PHACO AND INTRAOCULAR LENS PLACEMENT ([facility]);  Surgeon: Tonny Branch, MD;  Location: [facility];  Service: Ophthalmology;  Laterality: Left;  CDE:7.86  . COLONOSCOPY N/A 04/16/2018   Procedure: COLONOSCOPY;  Surgeon: Rogene Houston, MD;  Location: [facility];  Service: Endoscopy;  Laterality: N/A;  830  . FINGER SURGERY    . NASAL SINUS SURGERY    . PACEMAKER IMPLANT N/A 07/30/2016   Procedure: Pacemaker Implant Dual Chamber;  Surgeon: Evans Lance, MD;  Location: [facility];  Service: Cardiovascular;  Laterality: N/A;  . TONSILLECTOMY       Current Meds  Medication Sig  . acetaminophen (TYLENOL) 650 MG CR tablet Take 650 mg by mouth daily as needed for pain.  Marland Kitchen aspirin 81 MG tablet Take 81 mg by mouth daily.  Marland Kitchen atorvastatin (LIPITOR) 20 MG tablet Take 20 mg by mouth at bedtime.   Marland Kitchen  Biotin (BIOTIN 5000) 5 MG CAPS Take 5 mg by mouth daily at 12 noon.   . Cholecalciferol (VITAMIN D3) 2000 units capsule Take 2,000 Units by mouth daily at 12 noon.   . clobetasol (TEMOVATE) 0.05 % external solution Apply 1 application topically daily as needed (psoriasis).   . fexofenadine (ALLEGRA) 180 MG tablet Take 180 mg by mouth at bedtime.  . hydrochlorothiazide (MICROZIDE) 12.5 MG capsule Take 12.5 mg by mouth daily.  Marland Kitchen levothyroxine  (SYNTHROID, LEVOTHROID) 50 MCG tablet Take 50 mcg by mouth daily before breakfast.  . lisinopril (PRINIVIL,ZESTRIL) 5 MG tablet TAKE 1/2 (ONE-HALF) TABLET BY MOUTH ONCE DAILY (Patient taking differently: Take 2.5 mg by mouth every evening. )  . Multiple Vitamin (MULTIVITAMIN WITH MINERALS) TABS tablet Take 1 tablet by mouth daily at 12 noon.   . prasugrel (EFFIENT) 10 MG TABS tablet TAKE 1/2 (ONE-HALF) TABLET BY MOUTH ONCE DAILY (Patient taking differently: Take 5 mg by mouth daily. )  . triamcinolone cream (KENALOG) 0.1 % Apply 1 application topically daily as needed (psoriasis). Mixed with nystatin cream 1:1  . vitamin C (ASCORBIC ACID) 500 MG tablet Take 500 mg by mouth daily at 12 noon.     Allergies:   Sulfa antibiotics   Social History   Tobacco Use  . Smoking status: Never Smoker  . Smokeless tobacco: Never Used  Substance Use Topics  . Alcohol use: No    Alcohol/week: 0.0 standard drinks  . Drug use: No     Family Hx: The patient's family history includes Heart disease in her father.  ROS:   Please see the history of present illness. All other systems reviewed and are negative.   Prior CV studies:   The following studies were reviewed today:  Echocardiogram 10/02/2017 [facility]): LVEF 55 to 60%, mild left atrial enlargement, normal right ventricular contraction, mildly thickened aortic leaflets, mildly thickened mitral leaflets with trace mitral regurgitation, trace tricuspid regurgitation, no pericardial effusion.  Labs/Other Tests and Data Reviewed:    EKG:  An ECG dated 07/28/2017 was personally reviewed today and demonstrated:  Atrial paced rhythm.  Recent Labs:  October 2019: BUN 15, creatinine 0.95, potassium 4.2, AST 23, ALT 21, TSH 4.32, magnesium 1.6  Wt Readings from Last 3 Encounters:  06/26/18 142 lb (64.4 kg)  04/16/18 143 lb (64.9 kg)  12/26/17 143 lb 3.2 oz (65 kg)     Objective:    Vital Signs:  BP (!) 170/66   Pulse 66   Ht  5' (1.524 m)   Wt 142 lb (64.4 kg)   BMI 27.73 kg/m    Patient spoke in full sentences on the phone, not short of breath. No audible wheezing. Speech pattern normal.  ASSESSMENT & PLAN:    1.  CAD status post DES x2 to the LAD/diagonal bifurcation in 2015.  She is maintained on dual antiplatelet therapy long-term, no active angina symptoms at this time.  LVEF was 55 to 60% by echocardiogram last year.  No changes made today.  2.  Mixed hyperlipidemia, remains on Lipitor.  She is following with Dr. Pleas Koch.  3.  Sinus node dysfunction status post Biotronik pacemaker.  Keep follow-up with Dr. Lovena Le.  4.  Essential hypertension, blood pressure elevated by home check today, although had been controlled previously.  No significant changes made today, she continues on Zestril and hydrochlorothiazide.  COVID-19 Education: The signs and symptoms of COVID-19 were discussed with the patient and how to seek care for testing (  follow up with PCP or arrange E-visit).  The importance of social distancing was discussed today.  Time:   Today, I have spent 6 minutes with the patient with telehealth technology discussing the above problems.     Medication Adjustments/Labs and Tests Ordered: Current medicines are reviewed at length with the patient today.  Concerns regarding medicines are outlined above.   Tests Ordered: No orders of the defined types were placed in this encounter.   Medication Changes: No orders of the defined types were placed in this encounter.   Disposition:  Follow up 6 months in the [place] office.  Signed, [name], MD  06/26/2018 10:19 AM    [place]

## 2018-06-26 ENCOUNTER — Encounter: Payer: Self-pay | Admitting: Cardiology

## 2018-06-26 ENCOUNTER — Telehealth (INDEPENDENT_AMBULATORY_CARE_PROVIDER_SITE_OTHER): Payer: Medicare Other | Admitting: Cardiology

## 2018-06-26 VITALS — BP 170/66 | HR 66 | Ht 60.0 in | Wt 142.0 lb

## 2018-06-26 DIAGNOSIS — E782 Mixed hyperlipidemia: Secondary | ICD-10-CM

## 2018-06-26 DIAGNOSIS — Z7189 Other specified counseling: Secondary | ICD-10-CM | POA: Diagnosis not present

## 2018-06-26 DIAGNOSIS — E871 Hypo-osmolality and hyponatremia: Secondary | ICD-10-CM | POA: Diagnosis not present

## 2018-06-26 DIAGNOSIS — I25119 Atherosclerotic heart disease of native coronary artery with unspecified angina pectoris: Secondary | ICD-10-CM

## 2018-06-26 DIAGNOSIS — I495 Sick sinus syndrome: Secondary | ICD-10-CM

## 2018-06-26 DIAGNOSIS — E039 Hypothyroidism, unspecified: Secondary | ICD-10-CM | POA: Diagnosis not present

## 2018-06-26 DIAGNOSIS — K219 Gastro-esophageal reflux disease without esophagitis: Secondary | ICD-10-CM | POA: Diagnosis not present

## 2018-06-26 DIAGNOSIS — I5032 Chronic diastolic (congestive) heart failure: Secondary | ICD-10-CM | POA: Diagnosis not present

## 2018-06-26 DIAGNOSIS — I10 Essential (primary) hypertension: Secondary | ICD-10-CM

## 2018-06-26 NOTE — Patient Instructions (Signed)

## 2018-07-01 DIAGNOSIS — E039 Hypothyroidism, unspecified: Secondary | ICD-10-CM | POA: Diagnosis not present

## 2018-07-01 DIAGNOSIS — I5032 Chronic diastolic (congestive) heart failure: Secondary | ICD-10-CM | POA: Diagnosis not present

## 2018-07-01 DIAGNOSIS — E871 Hypo-osmolality and hyponatremia: Secondary | ICD-10-CM | POA: Diagnosis not present

## 2018-07-01 DIAGNOSIS — I10 Essential (primary) hypertension: Secondary | ICD-10-CM | POA: Diagnosis not present

## 2018-07-01 DIAGNOSIS — I700 Atherosclerosis of aorta: Secondary | ICD-10-CM | POA: Diagnosis not present

## 2018-07-01 DIAGNOSIS — Z6827 Body mass index (BMI) 27.0-27.9, adult: Secondary | ICD-10-CM | POA: Diagnosis not present

## 2018-07-01 DIAGNOSIS — Z955 Presence of coronary angioplasty implant and graft: Secondary | ICD-10-CM | POA: Diagnosis not present

## 2018-07-01 DIAGNOSIS — E782 Mixed hyperlipidemia: Secondary | ICD-10-CM | POA: Diagnosis not present

## 2018-07-29 ENCOUNTER — Encounter: Payer: Self-pay | Admitting: Internal Medicine

## 2018-07-29 ENCOUNTER — Other Ambulatory Visit: Payer: Self-pay

## 2018-07-29 ENCOUNTER — Ambulatory Visit (INDEPENDENT_AMBULATORY_CARE_PROVIDER_SITE_OTHER): Payer: Medicare Other | Admitting: Internal Medicine

## 2018-07-29 VITALS — BP 148/64 | HR 69 | Temp 97.6°F | Ht 59.0 in | Wt 143.0 lb

## 2018-07-29 DIAGNOSIS — I495 Sick sinus syndrome: Secondary | ICD-10-CM

## 2018-07-29 DIAGNOSIS — I25119 Atherosclerotic heart disease of native coronary artery with unspecified angina pectoris: Secondary | ICD-10-CM | POA: Diagnosis not present

## 2018-07-29 DIAGNOSIS — Z950 Presence of cardiac pacemaker: Secondary | ICD-10-CM

## 2018-07-29 NOTE — Progress Notes (Signed)
HPI Amy [name] returns today for PPM followup. She is a pleasant 73 yo woman with a h/o PAF, sinus node dysfunction, s/p PPM insertion. She denies anginal symptoms or palpitations. NO syncope. Minimal peripheral edema. Allergies  Allergen Reactions  . Sulfa Antibiotics Rash     Current Outpatient Medications  Medication Sig Dispense Refill  . acetaminophen (TYLENOL) 650 MG CR tablet Take 650 mg by mouth daily as needed for pain.    Marland Kitchen aspirin 81 MG tablet Take 81 mg by mouth daily.    Marland Kitchen atorvastatin (LIPITOR) 20 MG tablet Take 20 mg by mouth at bedtime.   3  . Biotin (BIOTIN 5000) 5 MG CAPS Take 5 mg by mouth daily at 12 noon.     . Cholecalciferol (VITAMIN D3) 2000 units capsule Take 2,000 Units by mouth daily at 12 noon.     . clobetasol (TEMOVATE) 0.05 % external solution Apply 1 application topically daily as needed (psoriasis).     . fexofenadine (ALLEGRA) 180 MG tablet Take 180 mg by mouth at bedtime.    . hydrochlorothiazide (MICROZIDE) 12.5 MG capsule Take 12.5 mg by mouth daily.    Marland Kitchen levothyroxine (SYNTHROID, LEVOTHROID) 50 MCG tablet Take 50 mcg by mouth daily before breakfast.    . lisinopril (PRINIVIL,ZESTRIL) 5 MG tablet TAKE 1/2 (ONE-HALF) TABLET BY MOUTH ONCE DAILY (Patient taking differently: Take 2.5 mg by mouth every evening. ) 45 tablet 2  . Multiple Vitamin (MULTIVITAMIN WITH MINERALS) TABS tablet Take 1 tablet by mouth daily at 12 noon.     . prasugrel (EFFIENT) 10 MG TABS tablet TAKE 1/2 (ONE-HALF) TABLET BY MOUTH ONCE DAILY (Patient taking differently: Take 5 mg by mouth daily. ) 45 each 2  . triamcinolone cream (KENALOG) 0.1 % Apply 1 application topically daily as needed (psoriasis). Mixed with nystatin cream 1:1    . vitamin C (ASCORBIC ACID) 500 MG tablet Take 500 mg by mouth daily at 12 noon.     No current facility-administered medications for this visit.      Past Medical History:  Diagnosis Date  . Asthma   . CAD (coronary artery disease)    60-70% proximal LAD/diagonal bifurcational disease October 2015 at [facility] status DES x 2 to LAD/diagonal October 2015  . Essential hypertension   . Hyperlipidemia   . Hypothyroidism     ROS:   All systems reviewed and negative except as noted in the HPI.   Past Surgical History:  Procedure Laterality Date  . ABDOMINAL HYSTERECTOMY    . ANAL FISSURE REPAIR    . CATARACT EXTRACTION W/PHACO Right 10/17/2014   Procedure: CATARACT EXTRACTION PHACO AND INTRAOCULAR LENS PLACEMENT ([place]);  Surgeon: Tonny Branch, MD;  Location: [facility];  Service: Ophthalmology;  Laterality: Right;  CDE: 4.88  . CATARACT EXTRACTION W/PHACO Left 10/27/2014   Procedure: CATARACT EXTRACTION PHACO AND INTRAOCULAR LENS PLACEMENT ([facility]);  Surgeon: Tonny Branch, MD;  Location: [facility] ORS;  Service: Ophthalmology;  Laterality: Left;  CDE:7.86  . COLONOSCOPY N/A 04/16/2018   Procedure: COLONOSCOPY;  Surgeon: Rogene Houston, MD;  Location: [facility] ENDO SUITE;  Service: Endoscopy;  Laterality: N/A;  830  . FINGER SURGERY    . NASAL SINUS SURGERY    . PACEMAKER IMPLANT N/A 07/30/2016   Procedure: Pacemaker Implant Dual Chamber;  Surgeon: Evans Lance, MD;  Location: [place] CV LAB;  Service: Cardiovascular;  Laterality: N/A;  . TONSILLECTOMY       Family History  Problem Relation  Age of Onset  . Heart disease Father      Social History   Socioeconomic History  . Marital status: Widowed    Spouse name: Not on file  . Number of children: Not on file  . Years of education: Not on file  . Highest education level: Not on file  Occupational History  . Not on file  Social Needs  . Financial resource strain: Not on file  . Food insecurity    Worry: Not on file    Inability: Not on file  . Transportation needs    Medical: Not on file    Non-medical: Not on file  Tobacco Use  . Smoking status: Never Smoker  . Smokeless tobacco: Never Used  Substance and Sexual Activity  . Alcohol use: No    Alcohol/week: 0.0 standard  drinks  . Drug use: No  . Sexual activity: Never  Lifestyle  . Physical activity    Days per week: Not on file    Minutes per session: Not on file  . Stress: Not on file  Relationships  . Social Herbalist on phone: Not on file    Gets together: Not on file    Attends religious service: Not on file    Active member of club or organization: Not on file    Attends meetings of clubs or organizations: Not on file    Relationship status: Not on file  . Intimate partner violence    Fear of current or ex partner: Not on file    Emotionally abused: Not on file    Physically abused: Not on file    Forced sexual activity: Not on file  Other Topics Concern  . Not on file  Social History Narrative  . Not on file     BP (!) 148/64 (BP Location: Right Arm)   Pulse 69   Temp 97.6 F (36.4 C)   Ht 4\' 11"  (1.499 m)   Wt 143 lb (64.9 kg)   SpO2 97%   BMI 28.88 kg/m   Physical Exam:  Well appearing NAD HEENT: Unremarkable Neck:  No JVD, no thyromegally Lymphatics:  No adenopathy Back:  No CVA tenderness Lungs:  No increased work of breathing HEART:  Regular rate rhythm Abd:  soft, positive bowel sounds, no organomegally, no rebound, no guarding Ext:  2 plus pulses, no edema, no cyanosis, no clubbing Skin:  No rashes no nodules Neuro:  CN II through XII intact, motor grossly intact   DEVICE  Normal device function.  See PaceArt for details.   Assess/Plan: 1. Sinus node dysfunction - she is asymptomatic, s/p PPM insertion.  2. PPM - her device interogation done by myself demonstrated a normal Biotronik DDD PM function with P/R waves of 4 and 16, a/v impedences of 565/546, and a threshold of 0.4 and 0.8 in the RA/RV respectively 3. HTN  - her systolic pressure is up a bit. I encouraged her to lose weight and reduce her salt intake.  Mikle Bosworth.D.

## 2018-07-29 NOTE — Patient Instructions (Signed)
Medication Instruction Your physician recommends that you continue on your current medications as directed. Please refer to the Current Medication list given to you today.  If you need a refill on your cardiac medications before your next appointment, please call your pharmacy.   Lab work: NONE  If you have labs (blood work) drawn today and your tests are completely normal, you will receive your results only by: Marland Kitchen MyChart Message (if you have MyChart) OR . A paper copy in the mail If you have any lab test that is abnormal or we need to change your treatment, we will call you to review the results.  Testing/Procedures: NONE   Follow-Up: At [facility], you and your health needs are our priority.  As part of our continuing mission to provide you with exceptional heart care, we have created designated Provider Care Teams.  These Care Teams include your primary Cardiologist (physician) and Advanced Practice Providers (APPs -  Physician Assistants and Nurse Practitioners) who all work together to provide you with the care you need, when you need it. You will need a follow up appointment in 1 years.  Please call our office 2 months in advance to schedule this appointment.  You may see Cristopher Peru, MD or one of the following Advanced Practice Providers on your designated Care Team:   Chanetta Marshall, NP . Tommye Standard, PA-C  Any Other Special Instructions Will Be Listed Below (If Applicable). Thank you for choosing [facility]!

## 2018-07-30 ENCOUNTER — Ambulatory Visit (INDEPENDENT_AMBULATORY_CARE_PROVIDER_SITE_OTHER): Payer: Medicare Other | Admitting: *Deleted

## 2018-07-30 DIAGNOSIS — I495 Sick sinus syndrome: Secondary | ICD-10-CM | POA: Diagnosis not present

## 2018-07-30 LAB — CUP PACEART REMOTE DEVICE CHECK
Date Time Interrogation Session: 20200625103337
Implantable Lead Implant Date: 20180626
Implantable Lead Implant Date: 20180626
Implantable Lead Location: 753859
Implantable Lead Location: 753860
Implantable Lead Model: 377
Implantable Lead Model: 377
Implantable Lead Serial Number: 49791993
Implantable Lead Serial Number: 49865965
Implantable Pulse Generator Implant Date: 20180626
Pulse Gen Model: 407145
Pulse Gen Serial Number: 69052081

## 2018-08-04 DIAGNOSIS — L400 Psoriasis vulgaris: Secondary | ICD-10-CM | POA: Diagnosis not present

## 2018-08-10 ENCOUNTER — Encounter: Payer: Self-pay | Admitting: Cardiology

## 2018-08-10 NOTE — Progress Notes (Signed)
Remote pacemaker transmission.   

## 2018-09-04 DIAGNOSIS — I10 Essential (primary) hypertension: Secondary | ICD-10-CM | POA: Diagnosis not present

## 2018-09-04 DIAGNOSIS — E039 Hypothyroidism, unspecified: Secondary | ICD-10-CM | POA: Diagnosis not present

## 2018-09-04 DIAGNOSIS — E782 Mixed hyperlipidemia: Secondary | ICD-10-CM | POA: Diagnosis not present

## 2018-09-29 DIAGNOSIS — I495 Sick sinus syndrome: Secondary | ICD-10-CM | POA: Diagnosis not present

## 2018-09-29 DIAGNOSIS — Z955 Presence of coronary angioplasty implant and graft: Secondary | ICD-10-CM | POA: Diagnosis not present

## 2018-09-29 DIAGNOSIS — Z6828 Body mass index (BMI) 28.0-28.9, adult: Secondary | ICD-10-CM | POA: Diagnosis not present

## 2018-09-29 DIAGNOSIS — I10 Essential (primary) hypertension: Secondary | ICD-10-CM | POA: Diagnosis not present

## 2018-09-29 DIAGNOSIS — I5032 Chronic diastolic (congestive) heart failure: Secondary | ICD-10-CM | POA: Diagnosis not present

## 2018-09-29 DIAGNOSIS — E782 Mixed hyperlipidemia: Secondary | ICD-10-CM | POA: Diagnosis not present

## 2018-09-29 DIAGNOSIS — I700 Atherosclerosis of aorta: Secondary | ICD-10-CM | POA: Diagnosis not present

## 2018-09-29 DIAGNOSIS — R5383 Other fatigue: Secondary | ICD-10-CM | POA: Diagnosis not present

## 2018-10-05 DIAGNOSIS — I10 Essential (primary) hypertension: Secondary | ICD-10-CM | POA: Diagnosis not present

## 2018-10-05 DIAGNOSIS — E039 Hypothyroidism, unspecified: Secondary | ICD-10-CM | POA: Diagnosis not present

## 2018-10-05 DIAGNOSIS — E1165 Type 2 diabetes mellitus with hyperglycemia: Secondary | ICD-10-CM | POA: Diagnosis not present

## 2018-10-29 ENCOUNTER — Ambulatory Visit (INDEPENDENT_AMBULATORY_CARE_PROVIDER_SITE_OTHER): Payer: Medicare Other | Admitting: *Deleted

## 2018-10-29 DIAGNOSIS — I495 Sick sinus syndrome: Secondary | ICD-10-CM | POA: Diagnosis not present

## 2018-10-29 LAB — CUP PACEART REMOTE DEVICE CHECK
Date Time Interrogation Session: 20200924162449
Implantable Lead Implant Date: 20180626
Implantable Lead Implant Date: 20180626
Implantable Lead Location: 753859
Implantable Lead Location: 753860
Implantable Lead Model: 377
Implantable Lead Model: 377
Implantable Lead Serial Number: 49791993
Implantable Lead Serial Number: 49865965
Implantable Pulse Generator Implant Date: 20180626
Pulse Gen Model: 407145
Pulse Gen Serial Number: 69052081

## 2018-11-06 ENCOUNTER — Encounter: Payer: Self-pay | Admitting: Cardiology

## 2018-11-06 NOTE — Progress Notes (Signed)
Remote pacemaker transmission.   

## 2018-12-01 DIAGNOSIS — Z23 Encounter for immunization: Secondary | ICD-10-CM | POA: Diagnosis not present

## 2018-12-04 DIAGNOSIS — E782 Mixed hyperlipidemia: Secondary | ICD-10-CM | POA: Diagnosis not present

## 2018-12-04 DIAGNOSIS — E039 Hypothyroidism, unspecified: Secondary | ICD-10-CM | POA: Diagnosis not present

## 2018-12-07 DIAGNOSIS — I10 Essential (primary) hypertension: Secondary | ICD-10-CM | POA: Diagnosis not present

## 2018-12-07 DIAGNOSIS — E559 Vitamin D deficiency, unspecified: Secondary | ICD-10-CM | POA: Diagnosis not present

## 2018-12-07 DIAGNOSIS — E871 Hypo-osmolality and hyponatremia: Secondary | ICD-10-CM | POA: Diagnosis not present

## 2018-12-07 DIAGNOSIS — K219 Gastro-esophageal reflux disease without esophagitis: Secondary | ICD-10-CM | POA: Diagnosis not present

## 2018-12-07 DIAGNOSIS — E039 Hypothyroidism, unspecified: Secondary | ICD-10-CM | POA: Diagnosis not present

## 2018-12-07 DIAGNOSIS — R5383 Other fatigue: Secondary | ICD-10-CM | POA: Diagnosis not present

## 2018-12-07 DIAGNOSIS — E782 Mixed hyperlipidemia: Secondary | ICD-10-CM | POA: Diagnosis not present

## 2018-12-07 DIAGNOSIS — I2510 Atherosclerotic heart disease of native coronary artery without angina pectoris: Secondary | ICD-10-CM | POA: Diagnosis not present

## 2018-12-08 ENCOUNTER — Other Ambulatory Visit: Payer: Self-pay | Admitting: Cardiology

## 2018-12-10 DIAGNOSIS — L900 Lichen sclerosus et atrophicus: Secondary | ICD-10-CM | POA: Diagnosis not present

## 2018-12-11 DIAGNOSIS — I495 Sick sinus syndrome: Secondary | ICD-10-CM | POA: Diagnosis not present

## 2018-12-11 DIAGNOSIS — E039 Hypothyroidism, unspecified: Secondary | ICD-10-CM | POA: Diagnosis not present

## 2018-12-11 DIAGNOSIS — Z950 Presence of cardiac pacemaker: Secondary | ICD-10-CM | POA: Diagnosis not present

## 2018-12-11 DIAGNOSIS — Z6828 Body mass index (BMI) 28.0-28.9, adult: Secondary | ICD-10-CM | POA: Diagnosis not present

## 2018-12-11 DIAGNOSIS — R61 Generalized hyperhidrosis: Secondary | ICD-10-CM | POA: Diagnosis not present

## 2018-12-11 DIAGNOSIS — I700 Atherosclerosis of aorta: Secondary | ICD-10-CM | POA: Diagnosis not present

## 2018-12-11 DIAGNOSIS — I5032 Chronic diastolic (congestive) heart failure: Secondary | ICD-10-CM | POA: Diagnosis not present

## 2018-12-11 DIAGNOSIS — I10 Essential (primary) hypertension: Secondary | ICD-10-CM | POA: Diagnosis not present

## 2018-12-12 ENCOUNTER — Other Ambulatory Visit: Payer: Self-pay | Admitting: Cardiology

## 2018-12-14 ENCOUNTER — Telehealth: Payer: Self-pay | Admitting: Internal Medicine

## 2018-12-14 DIAGNOSIS — Z1231 Encounter for screening mammogram for malignant neoplasm of breast: Secondary | ICD-10-CM | POA: Diagnosis not present

## 2018-12-14 NOTE — Telephone Encounter (Signed)
New Message:  Patient had a mammogram this morning, and the technician went a little too high. The image on the mammogram showed the patient's pacemaker in the images.   The patient just wants to make sure that her pacemaker could not be damaged by the mammogram. She was sore when the test was done. She came home and took a tylenol and feels a bit better now.   Please advise

## 2018-12-14 NOTE — Telephone Encounter (Signed)
Patient reassured mammogram should not have damaged PPM. Device implanted 07/30/16.

## 2018-12-24 ENCOUNTER — Ambulatory Visit (INDEPENDENT_AMBULATORY_CARE_PROVIDER_SITE_OTHER): Payer: Medicare Other | Admitting: Cardiology

## 2018-12-24 ENCOUNTER — Encounter: Payer: Self-pay | Admitting: *Deleted

## 2018-12-24 ENCOUNTER — Encounter: Payer: Self-pay | Admitting: Cardiology

## 2018-12-24 ENCOUNTER — Other Ambulatory Visit: Payer: Self-pay

## 2018-12-24 VITALS — BP 148/64 | HR 65 | Ht 60.0 in | Wt 146.0 lb

## 2018-12-24 DIAGNOSIS — I495 Sick sinus syndrome: Secondary | ICD-10-CM

## 2018-12-24 DIAGNOSIS — E782 Mixed hyperlipidemia: Secondary | ICD-10-CM

## 2018-12-24 DIAGNOSIS — I10 Essential (primary) hypertension: Secondary | ICD-10-CM

## 2018-12-24 DIAGNOSIS — I25119 Atherosclerotic heart disease of native coronary artery with unspecified angina pectoris: Secondary | ICD-10-CM | POA: Diagnosis not present

## 2018-12-24 NOTE — Progress Notes (Signed)
Cardiology Office Note  Date: 12/24/2018   ID: Amy [name], Amy [name] [birth year], MRN [id number]  PCP:  [name], MD  Cardiologist:  [name], MD Electrophysiologist:  Cristopher Peru, MD   Chief Complaint  Patient presents with  . Cardiac follow-up    History of Present Illness: Amy [name] is a 73 y.o. female last assessed via telehealth encounter in May.  She is here with her daughter for a follow-up visit.  She does not report any active angina symptoms at this time on medical therapy.  States that she continues to use her home elliptical machine 30 minutes, 5 days a week.  She follows with Dr. Lovena Le in the device clinic, Biotronik dual-chamber pacemaker in place.  She had a follow-up visit with him in June.  Device check in September showed normal function.  I reviewed her medications which are outlined below.  Current cardiac regimen includes aspirin, Effient, Lipitor, HCTZ, and Zestril.  She reports having had recent lab work with Dr. Pleas Koch, we are requesting the results.  I personally reviewed her ECG today which shows normal sinus rhythm.  Past Medical History:  Diagnosis Date  . Asthma   . CAD (coronary artery disease)    60-70% proximal LAD/diagonal bifurcational disease October 2015 at [facility] status DES x 2 to LAD/diagonal October 2015  . Essential hypertension   . Hyperlipidemia   . Hypothyroidism     Past Surgical History:  Procedure Laterality Date  . ABDOMINAL HYSTERECTOMY    . ANAL FISSURE REPAIR    . CATARACT EXTRACTION W/PHACO Right 10/17/2014   Procedure: CATARACT EXTRACTION PHACO AND INTRAOCULAR LENS PLACEMENT ([place]);  Surgeon: Tonny Branch, MD;  Location: [facility];  Service: Ophthalmology;  Laterality: Right;  CDE: 4.88  . CATARACT EXTRACTION W/PHACO Left 10/27/2014   Procedure: CATARACT EXTRACTION PHACO AND INTRAOCULAR LENS PLACEMENT ([facility]);  Surgeon: Tonny Branch, MD;  Location: [facility];  Service: Ophthalmology;  Laterality: Left;  CDE:7.86   . COLONOSCOPY N/A 04/16/2018   Procedure: COLONOSCOPY;  Surgeon: Rogene Houston, MD;  Location: [facility];  Service: Endoscopy;  Laterality: N/A;  830  . FINGER SURGERY    . NASAL SINUS SURGERY    . PACEMAKER IMPLANT N/A 07/30/2016   Procedure: Pacemaker Implant Dual Chamber;  Surgeon: Evans Lance, MD;  Location: [place] CV LAB;  Service: Cardiovascular;  Laterality: N/A;  . TONSILLECTOMY      Current Outpatient Medications  Medication Sig Dispense Refill  . aspirin 81 MG tablet Take 81 mg by mouth daily.    Marland Kitchen atorvastatin (LIPITOR) 20 MG tablet Take 20 mg by mouth at bedtime.   3  . Biotin (BIOTIN 5000) 5 MG CAPS Take 5 mg by mouth daily at 12 noon.     . Cholecalciferol (VITAMIN D3) 2000 units capsule Take 2,000 Units by mouth daily at 12 noon.     . clobetasol (TEMOVATE) 0.05 % external solution Apply 1 application topically daily as needed (psoriasis).     . fexofenadine (ALLEGRA) 180 MG tablet Take 180 mg by mouth at bedtime.    . hydrochlorothiazide (MICROZIDE) 12.5 MG capsule Take 12.5 mg by mouth daily.    Marland Kitchen levothyroxine (SYNTHROID, LEVOTHROID) 50 MCG tablet Take 50 mcg by mouth daily before breakfast.    . lisinopril (ZESTRIL) 5 MG tablet Take 1/2 (one-half) tablet by mouth once daily 45 tablet 3  . Multiple Vitamin (MULTIVITAMIN WITH MINERALS) TABS tablet Take 1 tablet by mouth daily at  12 noon.     . prasugrel (EFFIENT) 10 MG TABS tablet Take 1/2 (one-half) tablet by mouth once daily 45 tablet 0  . triamcinolone cream (KENALOG) 0.1 % Apply 1 application topically daily as needed (psoriasis). Mixed with nystatin cream 1:1    . vitamin C (ASCORBIC ACID) 500 MG tablet Take 500 mg by mouth daily at 12 noon.    Marland Kitchen acetaminophen (TYLENOL) 650 MG CR tablet Take 650 mg by mouth daily as needed for pain.     No current facility-administered medications for this visit.    Allergies:  Sulfa antibiotics   Social History: The patient  reports that she has never smoked. She  has never used smokeless tobacco. She reports that she does not drink alcohol or use drugs.   ROS:  Please see the history of present illness. Otherwise, complete review of systems is positive for none.  All other systems are reviewed and negative.   Physical Exam: VS:  BP (!) 148/64   Pulse 65   Ht 5' (1.524 m)   Wt 146 lb (66.2 kg)   SpO2 98%   BMI 28.51 kg/m , BMI Body mass index is 28.51 kg/m.  Wt Readings from Last 3 Encounters:  12/24/18 146 lb (66.2 kg)  07/29/18 143 lb (64.9 kg)  06/26/18 142 lb (64.4 kg)    General: Elderly woman, appears comfortable at rest. HEENT: Conjunctiva and lids normal, wearing a mask. Neck: Supple, no elevated JVP or carotid bruits, no thyromegaly. Lungs: Clear to auscultation, nonlabored breathing at rest. Cardiac: Regular rate and rhythm, no S3, 2/6 systolic murmur. Abdomen: Soft, nontender, bowel sounds present. Extremities: No pitting edema, distal pulses 2+. Skin: Warm and dry. Musculoskeletal: No kyphosis. Neuropsychiatric: Alert and oriented x3, affect grossly appropriate.  ECG:  An ECG dated 07/28/2017 was personally reviewed today and demonstrated:  Atrial paced rhythm.  Recent Labwork:  October 2019: BUN 15, creatinine 0.95, potassium 4.2, AST 23, ALT 21, TSH 4.32, magnesium 1.6  Other Studies Reviewed Today:  Echocardiogram 10/02/2017 [facility]): LVEF 55 to 60%, mild left atrial enlargement, normal right ventricular contraction, mildly thickened aortic leaflets, mildly thickened mitral leaflets with trace mitral regurgitation, trace tricuspid regurgitation, no pericardial effusion.  Assessment and Plan:  1.  CAD status post DES x2 to the LAD/diagonal bifurcation in 2015.  She is doing well without active angina at this time.  We plan to continue long-term dual antiplatelet therapy.  She does not report any bleeding problems.  Continue ACE inhibitor and statin.  2.  Mixed hyperlipidemia, on Lipitor.  Requesting  recent lab work from Dr. Pleas Koch.  3.  Sinus node dysfunction with Biotronik pacemaker in place.  Keep follow-up with Dr. Lovena Le.  4.  Essential hypertension, systolic in the 0000000 today.  No changes made to present regimen.  Medication Adjustments/Labs and Tests Ordered: Current medicines are reviewed at length with the patient today.  Concerns regarding medicines are outlined above.   Tests Ordered: Orders Placed This Encounter  Procedures  . EKG 12-Lead    Medication Changes: No orders of the defined types were placed in this encounter.   Disposition:  Follow up 6 months in the [place] office.  Signed, Satira Sark, MD, [facility] 12/24/2018 4:47 PM    [facility] at [place], [place], [place] [address] Phone: [phone]; Fax: [phone]

## 2018-12-24 NOTE — Patient Instructions (Addendum)

## 2019-01-05 DIAGNOSIS — L900 Lichen sclerosus et atrophicus: Secondary | ICD-10-CM | POA: Diagnosis not present

## 2019-01-19 DIAGNOSIS — N9089 Other specified noninflammatory disorders of vulva and perineum: Secondary | ICD-10-CM | POA: Diagnosis not present

## 2019-01-28 ENCOUNTER — Ambulatory Visit (INDEPENDENT_AMBULATORY_CARE_PROVIDER_SITE_OTHER): Payer: Medicare Other | Admitting: *Deleted

## 2019-01-28 DIAGNOSIS — I495 Sick sinus syndrome: Secondary | ICD-10-CM | POA: Diagnosis not present

## 2019-01-29 LAB — CUP PACEART REMOTE DEVICE CHECK
Date Time Interrogation Session: 20201223193421
Implantable Lead Implant Date: 20180626
Implantable Lead Implant Date: 20180626
Implantable Lead Location: 753859
Implantable Lead Location: 753860
Implantable Lead Model: 377
Implantable Lead Model: 377
Implantable Lead Serial Number: 49791993
Implantable Lead Serial Number: 49865965
Implantable Pulse Generator Implant Date: 20180626
Pulse Gen Model: 407145
Pulse Gen Serial Number: 69052081

## 2019-02-04 DIAGNOSIS — I10 Essential (primary) hypertension: Secondary | ICD-10-CM | POA: Diagnosis not present

## 2019-02-04 DIAGNOSIS — E782 Mixed hyperlipidemia: Secondary | ICD-10-CM | POA: Diagnosis not present

## 2019-03-11 ENCOUNTER — Other Ambulatory Visit: Payer: Self-pay | Admitting: Internal Medicine

## 2019-04-02 DIAGNOSIS — E7849 Other hyperlipidemia: Secondary | ICD-10-CM | POA: Diagnosis not present

## 2019-04-02 DIAGNOSIS — E039 Hypothyroidism, unspecified: Secondary | ICD-10-CM | POA: Diagnosis not present

## 2019-04-02 DIAGNOSIS — I10 Essential (primary) hypertension: Secondary | ICD-10-CM | POA: Diagnosis not present

## 2019-04-29 ENCOUNTER — Ambulatory Visit (INDEPENDENT_AMBULATORY_CARE_PROVIDER_SITE_OTHER): Payer: Medicare Other | Admitting: *Deleted

## 2019-04-29 DIAGNOSIS — I495 Sick sinus syndrome: Secondary | ICD-10-CM | POA: Diagnosis not present

## 2019-04-29 LAB — CUP PACEART REMOTE DEVICE CHECK
Battery Remaining Percentage: 80 %
Brady Statistic RA Percent Paced: 80 %
Brady Statistic RV Percent Paced: 0 %
Date Time Interrogation Session: 20210325065914
Implantable Lead Implant Date: 20180626
Implantable Lead Implant Date: 20180626
Implantable Lead Location: 753859
Implantable Lead Location: 753860
Implantable Lead Model: 377
Implantable Lead Model: 377
Implantable Lead Serial Number: 49791993
Implantable Lead Serial Number: 49865965
Implantable Pulse Generator Implant Date: 20180626
Lead Channel Impedance Value: 488 Ohm
Lead Channel Impedance Value: 546 Ohm
Lead Channel Pacing Threshold Amplitude: 0.8 V
Lead Channel Pacing Threshold Amplitude: 0.9 V
Lead Channel Pacing Threshold Pulse Width: 0.4 ms
Lead Channel Pacing Threshold Pulse Width: 0.4 ms
Lead Channel Sensing Intrinsic Amplitude: 13 mV
Lead Channel Sensing Intrinsic Amplitude: 3.8 mV
Lead Channel Setting Pacing Amplitude: 2 V
Lead Channel Setting Pacing Amplitude: 2.4 V
Lead Channel Setting Pacing Pulse Width: 0.4 ms
Pulse Gen Model: 407145
Pulse Gen Serial Number: 69052081

## 2019-04-30 NOTE — Progress Notes (Signed)
PPM Remote  

## 2019-05-05 DIAGNOSIS — I10 Essential (primary) hypertension: Secondary | ICD-10-CM | POA: Diagnosis not present

## 2019-05-05 DIAGNOSIS — E7849 Other hyperlipidemia: Secondary | ICD-10-CM | POA: Diagnosis not present

## 2019-05-20 DIAGNOSIS — Z23 Encounter for immunization: Secondary | ICD-10-CM | POA: Diagnosis not present

## 2019-06-04 DIAGNOSIS — R001 Bradycardia, unspecified: Secondary | ICD-10-CM | POA: Diagnosis not present

## 2019-06-04 DIAGNOSIS — I10 Essential (primary) hypertension: Secondary | ICD-10-CM | POA: Diagnosis not present

## 2019-06-04 DIAGNOSIS — E7849 Other hyperlipidemia: Secondary | ICD-10-CM | POA: Diagnosis not present

## 2019-06-07 NOTE — Progress Notes (Signed)
Cardiology Office Note  Date: 06/08/2019   ID: Yittel, Rachford Aug 27, 1945, MRN [id number]  PCP:  [name], MD  Cardiologist:  [name], MD Electrophysiologist:  Cristopher Peru, MD   Chief Complaint  Patient presents with  . Cardiac follow-up    History of Present Illness: Amy [name] is a 74 y.o. female last seen in November 2020.  She presents for a routine visit.  Since last assessment she does not report any obvious angina symptoms.  Still using her seated elliptical machine at home 5 days a week.  I reviewed her medications today.  We discussed increasing lisinopril to 5 mg daily for better blood pressure control.  Renal function and potassium were normal as of November 2020.  LDL was 68 on statin therapy.  She sees Dr. Lovena Le, Biotronik dual-chamber pacemaker in place.  Device check in March showed normal function.  I reviewed her lab work from November 2020 obtained by Dr. Pleas Koch.  She had her first dose of coronavirus vaccine recently.  Past Medical History:  Diagnosis Date  . Asthma   . CAD (coronary artery disease)    60-70% proximal LAD/diagonal bifurcational disease October 2015 at [facility] status DES x 2 to LAD/diagonal October 2015  . Essential hypertension   . Hyperlipidemia   . Hypothyroidism     Past Surgical History:  Procedure Laterality Date  . ABDOMINAL HYSTERECTOMY    . ANAL FISSURE REPAIR    . CATARACT EXTRACTION W/PHACO Right 10/17/2014   Procedure: CATARACT EXTRACTION PHACO AND INTRAOCULAR LENS PLACEMENT ([place]);  Surgeon: Tonny Branch, MD;  Location: [facility];  Service: Ophthalmology;  Laterality: Right;  CDE: 4.88  . CATARACT EXTRACTION W/PHACO Left 10/27/2014   Procedure: CATARACT EXTRACTION PHACO AND INTRAOCULAR LENS PLACEMENT ([facility]);  Surgeon: Tonny Branch, MD;  Location: [facility] ORS;  Service: Ophthalmology;  Laterality: Left;  CDE:7.86  . COLONOSCOPY N/A 04/16/2018   Procedure: COLONOSCOPY;  Surgeon: Rogene Houston, MD;  Location: [facility]  ENDO SUITE;  Service: Endoscopy;  Laterality: N/A;  830  . FINGER SURGERY    . NASAL SINUS SURGERY    . PACEMAKER IMPLANT N/A 07/30/2016   Procedure: Pacemaker Implant Dual Chamber;  Surgeon: Evans Lance, MD;  Location: [place] CV LAB;  Service: Cardiovascular;  Laterality: N/A;  . TONSILLECTOMY      Current Outpatient Medications  Medication Sig Dispense Refill  . acetaminophen (TYLENOL) 650 MG CR tablet Take 650 mg by mouth daily as needed for pain.    Marland Kitchen aspirin 81 MG tablet Take 81 mg by mouth daily.    Marland Kitchen atorvastatin (LIPITOR) 20 MG tablet Take 20 mg by mouth at bedtime.   3  . Biotin (BIOTIN 5000) 5 MG CAPS Take 5 mg by mouth daily at 12 noon.     . Cholecalciferol (VITAMIN D3) 2000 units capsule Take 2,000 Units by mouth daily at 12 noon.     . clobetasol (TEMOVATE) 0.05 % external solution Apply 1 application topically daily as needed (psoriasis).     . fexofenadine (ALLEGRA) 180 MG tablet Take 180 mg by mouth at bedtime.    . hydrochlorothiazide (MICROZIDE) 12.5 MG capsule Take 12.5 mg by mouth daily.    Marland Kitchen levothyroxine (SYNTHROID, LEVOTHROID) 50 MCG tablet Take 50 mcg by mouth daily before breakfast.    . Multiple Vitamin (MULTIVITAMIN WITH MINERALS) TABS tablet Take 1 tablet by mouth daily at 12 noon.     . prasugrel (EFFIENT) 10 MG TABS tablet  TAKE 1/2 TABLET BY MOUTH EVERY DAY 45 tablet 2  . triamcinolone cream (KENALOG) 0.1 % Apply 1 application topically daily as needed (psoriasis). Mixed with nystatin cream 1:1    . vitamin C (ASCORBIC ACID) 500 MG tablet Take 500 mg by mouth daily at 12 noon.    Marland Kitchen lisinopril (ZESTRIL) 5 MG tablet Take 1 tablet (5 mg total) by mouth daily. 90 tablet 3   No current facility-administered medications for this visit.   Allergies:  Sulfa antibiotics   ROS:  No palpitations or syncope.  Physical Exam: VS:  BP (!) 159/62   Pulse 71   Ht 4' 11.5" (1.511 m)   Wt 153 lb (69.4 kg)   SpO2 96%   BMI 30.39 kg/m , BMI Body mass index is  30.39 kg/m.  Wt Readings from Last 3 Encounters:  06/08/19 153 lb (69.4 kg)  12/24/18 146 lb (66.2 kg)  07/29/18 143 lb (64.9 kg)    General: Elderly woman, appears comfortable at rest. HEENT: Conjunctiva and lids normal, wearing a mask. Neck: Supple, no elevated JVP or carotid bruits, no thyromegaly. Lungs: Clear to auscultation, nonlabored breathing at rest. Cardiac: Regular rate and rhythm, no S3, 2/6 systolic murmur, no pericardial rub. Abdomen: Soft, nontender, bowel sounds present. Extremities: No pitting edema, distal pulses 2+.  ECG:  An ECG dated 12/24/2018 was personally reviewed today and demonstrated:  Normal sinus rhythm.  Recent Labwork:  November 2020: BUN 15, creatinine 0.85, potassium 4.9, AST 23, ALT 17, hemoglobin 13.1, platelets 198, cholesterol 163, triglycerides 56, HDL 84, LDL 68, TSH 3.89  Other Studies Reviewed Today:  [facility] LVEF 55 to 60%, mild left atrial enlargement, normal right ventricular contraction, mildly thickened aortic leaflets, mildly thickened mitral leaflets with trace mitral regurgitation, trace tricuspid regurgitation, no pericardial effusion.  Assessment and Plan:  1.  CAD status post DES x2 to the LAD/diagonal bifurcation in 2015.  She reports no active angina and continues on medical therapy including long-term DAPT, Lipitor, and lisinopril.  No changes were made today.  2.  Mixed hyperlipidemia, tolerating Lipitor with last LDL 68.  3.  Essential hypertension, blood pressure control is not optimal.  We will attempt an increase in lisinopril to 5 mg daily.  Follow-up BMET in 7 to 10 days.  Medication Adjustments/Labs and Tests Ordered: Current medicines are reviewed at length with the patient today.  Concerns regarding medicines are outlined above.   Tests Ordered: Orders Placed This Encounter  Procedures  . Basic Metabolic Panel (BMET)    Medication Changes: Meds ordered this  encounter  Medications  . lisinopril (ZESTRIL) 5 MG tablet    Sig: Take 1 tablet (5 mg total) by mouth daily.    Dispense:  90 tablet    Refill:  3    Disposition:  Follow up 6 months in the [place] office.  Signed, Satira Sark, MD, [facility] 06/08/2019 1:46 PM    [place] at [facility] [address], [place], [place] [address] Phone: [phone]; Fax: [phone]

## 2019-06-08 ENCOUNTER — Other Ambulatory Visit: Payer: Self-pay

## 2019-06-08 ENCOUNTER — Ambulatory Visit (INDEPENDENT_AMBULATORY_CARE_PROVIDER_SITE_OTHER): Payer: Medicare Other | Admitting: Cardiology

## 2019-06-08 ENCOUNTER — Encounter: Payer: Self-pay | Admitting: Cardiology

## 2019-06-08 VITALS — BP 159/62 | HR 71 | Ht 59.5 in | Wt 153.0 lb

## 2019-06-08 DIAGNOSIS — I495 Sick sinus syndrome: Secondary | ICD-10-CM | POA: Diagnosis not present

## 2019-06-08 DIAGNOSIS — I25119 Atherosclerotic heart disease of native coronary artery with unspecified angina pectoris: Secondary | ICD-10-CM

## 2019-06-08 DIAGNOSIS — E782 Mixed hyperlipidemia: Secondary | ICD-10-CM

## 2019-06-08 DIAGNOSIS — Z79899 Other long term (current) drug therapy: Secondary | ICD-10-CM

## 2019-06-08 MED ORDER — LISINOPRIL 5 MG PO TABS: 5.0000 mg | ORAL_TABLET | Freq: Every day | ORAL | 3 refills | Status: DC

## 2019-06-08 NOTE — Patient Instructions (Signed)
Medication Instructions:  Your physician has recommended you make the following change in your medication:  Lisinopril 5 mg Daily   *If you need a refill on your cardiac medications before your next appointment, please call your pharmacy*   Lab Work: Your physician recommends that you return for lab work in: 7-10 Days   If you have labs (blood work) drawn today and your tests are completely normal, you will receive your results only by: Marland Kitchen MyChart Message (if you have MyChart) OR . A paper copy in the mail If you have any lab test that is abnormal or we need to change your treatment, we will call you to review the results.   Testing/Procedures: NONE    Follow-Up: At [facility], you and your health needs are our priority.  As part of our continuing mission to provide you with exceptional heart care, we have created designated Provider Care Teams.  These Care Teams include your primary Cardiologist (physician) and Advanced Practice Providers (APPs -  Physician Assistants and Nurse Practitioners) who all work together to provide you with the care you need, when you need it.  We recommend signing up for the patient portal called "MyChart".  Sign up information is provided on this After Visit Summary.  MyChart is used to connect with patients for Virtual Visits (Telemedicine).  Patients are able to view lab/test results, encounter notes, upcoming appointments, etc.  Non-urgent messages can be sent to your provider as well.   To learn more about what you can do with MyChart, go to [email or web address].    Your next appointment:   6 month(s)  The format for your next appointment:   In Person  Provider:   [name], MD   Other Instructions Thank you for choosing [facility]!

## 2019-06-10 DIAGNOSIS — Z23 Encounter for immunization: Secondary | ICD-10-CM | POA: Diagnosis not present

## 2019-06-14 DIAGNOSIS — E782 Mixed hyperlipidemia: Secondary | ICD-10-CM | POA: Diagnosis not present

## 2019-06-14 DIAGNOSIS — E039 Hypothyroidism, unspecified: Secondary | ICD-10-CM | POA: Diagnosis not present

## 2019-06-14 DIAGNOSIS — E871 Hypo-osmolality and hyponatremia: Secondary | ICD-10-CM | POA: Diagnosis not present

## 2019-06-14 DIAGNOSIS — K219 Gastro-esophageal reflux disease without esophagitis: Secondary | ICD-10-CM | POA: Diagnosis not present

## 2019-06-14 DIAGNOSIS — I495 Sick sinus syndrome: Secondary | ICD-10-CM | POA: Diagnosis not present

## 2019-06-14 DIAGNOSIS — Z955 Presence of coronary angioplasty implant and graft: Secondary | ICD-10-CM | POA: Diagnosis not present

## 2019-06-14 DIAGNOSIS — Z950 Presence of cardiac pacemaker: Secondary | ICD-10-CM | POA: Diagnosis not present

## 2019-06-14 DIAGNOSIS — I10 Essential (primary) hypertension: Secondary | ICD-10-CM | POA: Diagnosis not present

## 2019-06-14 DIAGNOSIS — I700 Atherosclerosis of aorta: Secondary | ICD-10-CM | POA: Diagnosis not present

## 2019-06-14 DIAGNOSIS — F411 Generalized anxiety disorder: Secondary | ICD-10-CM | POA: Diagnosis not present

## 2019-06-14 DIAGNOSIS — S161XXA Strain of muscle, fascia and tendon at neck level, initial encounter: Secondary | ICD-10-CM | POA: Diagnosis not present

## 2019-06-14 DIAGNOSIS — Z6830 Body mass index (BMI) 30.0-30.9, adult: Secondary | ICD-10-CM | POA: Diagnosis not present

## 2019-06-15 DIAGNOSIS — L280 Lichen simplex chronicus: Secondary | ICD-10-CM | POA: Diagnosis not present

## 2019-07-27 DIAGNOSIS — I10 Essential (primary) hypertension: Secondary | ICD-10-CM | POA: Diagnosis not present

## 2019-07-27 DIAGNOSIS — R519 Headache, unspecified: Secondary | ICD-10-CM | POA: Diagnosis not present

## 2019-07-28 ENCOUNTER — Telehealth: Payer: Self-pay | Admitting: Cardiology

## 2019-07-28 ENCOUNTER — Encounter: Payer: Self-pay | Admitting: *Deleted

## 2019-07-28 NOTE — Telephone Encounter (Signed)
Per daughter April, Dr. Pleas Koch increased lisinopril 10 mg daily this morning after office visit. Seen at [facility] ED last night for elevated BP 238/101 initially. Before leaving ED BP 174/92. Per daughter, headache has improved. Daughter unable to give exact BP and HR that was taken at PCP today. Denies dizziness, chest pain or sob. PCP notes requested. [facility] ED notes available in Care Everywhere.

## 2019-07-28 NOTE — Telephone Encounter (Signed)
Daughter-April walked into office stating that her mother had a bad headache with elevated BP on 07/27/2019. Was seen by Dr.Burdine this AM. States that Dr. Pleas Koch wanted Dr. Domenic Polite to review ER notes and advise if patient's medications need to be changed.   Please call [phone] (April)

## 2019-07-28 NOTE — Telephone Encounter (Signed)
Patient's daughter informed and verbalized understanding of plan. 

## 2019-07-28 NOTE — Telephone Encounter (Signed)
Thank you for further updates.  I agree with increasing lisinopril to 10 mg daily, continue to track blood pressure.  Addition of Norvasc would be a further consideration if needed.  Please schedule an office visit with [place] in the next few weeks to see if any further evaluation is necessary.

## 2019-07-28 NOTE — Telephone Encounter (Signed)
Alma Friendly, I need your help sorting this out in further detail.  If you could contact the patient's daughter and also get the records from [facility] as well as Dr. Lizbeth Bark note that would be helpful.

## 2019-07-29 ENCOUNTER — Ambulatory Visit (INDEPENDENT_AMBULATORY_CARE_PROVIDER_SITE_OTHER): Payer: Medicare Other | Admitting: *Deleted

## 2019-07-29 DIAGNOSIS — Z950 Presence of cardiac pacemaker: Secondary | ICD-10-CM

## 2019-07-29 LAB — CUP PACEART REMOTE DEVICE CHECK
Battery Remaining Percentage: 75 %
Brady Statistic RA Percent Paced: 80 %
Brady Statistic RV Percent Paced: 0 %
Date Time Interrogation Session: 20210624064057
Implantable Lead Implant Date: 20180626
Implantable Lead Implant Date: 20180626
Implantable Lead Location: 753859
Implantable Lead Location: 753860
Implantable Lead Model: 377
Implantable Lead Model: 377
Implantable Lead Serial Number: 49791993
Implantable Lead Serial Number: 49865965
Implantable Pulse Generator Implant Date: 20180626
Lead Channel Impedance Value: 507 Ohm
Lead Channel Impedance Value: 605 Ohm
Lead Channel Pacing Threshold Amplitude: 0.8 V
Lead Channel Pacing Threshold Pulse Width: 0.4 ms
Lead Channel Sensing Intrinsic Amplitude: 16.5 mV
Lead Channel Sensing Intrinsic Amplitude: 6.3 mV
Lead Channel Setting Pacing Amplitude: 2 V
Lead Channel Setting Pacing Amplitude: 2.4 V
Lead Channel Setting Pacing Pulse Width: 0.4 ms
Pulse Gen Model: 407145
Pulse Gen Serial Number: 69052081

## 2019-08-02 NOTE — Progress Notes (Signed)
Remote pacemaker transmission.   

## 2019-08-04 ENCOUNTER — Ambulatory Visit (INDEPENDENT_AMBULATORY_CARE_PROVIDER_SITE_OTHER): Payer: Medicare Other | Admitting: Internal Medicine

## 2019-08-04 ENCOUNTER — Other Ambulatory Visit: Payer: Self-pay

## 2019-08-04 VITALS — BP 144/68 | HR 69 | Ht 59.5 in | Wt 151.0 lb

## 2019-08-04 DIAGNOSIS — Z950 Presence of cardiac pacemaker: Secondary | ICD-10-CM | POA: Diagnosis not present

## 2019-08-04 DIAGNOSIS — I25119 Atherosclerotic heart disease of native coronary artery with unspecified angina pectoris: Secondary | ICD-10-CM

## 2019-08-04 DIAGNOSIS — E782 Mixed hyperlipidemia: Secondary | ICD-10-CM

## 2019-08-04 DIAGNOSIS — I495 Sick sinus syndrome: Secondary | ICD-10-CM | POA: Diagnosis not present

## 2019-08-04 NOTE — Progress Notes (Signed)
HPI Amy [name] returns today for followup. She is a pleasant 74 yo woman with a h/o sinus node dysfunction, pAF, s/p PPM insertion. She notes that her bp has improved since her lisinopril was increased from 5 to 10 mg. She denies chest pain. She is s/p PCI/Stent over 5 years ago. She has minimal leg swelling.  Allergies  Allergen Reactions  . Sulfa Antibiotics Rash     Current Outpatient Medications  Medication Sig Dispense Refill  . acetaminophen (TYLENOL) 650 MG CR tablet Take 650 mg by mouth daily as needed for pain.    Marland Kitchen aspirin 81 MG tablet Take 81 mg by mouth daily.    Marland Kitchen atorvastatin (LIPITOR) 20 MG tablet Take 20 mg by mouth at bedtime.   3  . Biotin (BIOTIN 5000) 5 MG CAPS Take 5 mg by mouth daily at 12 noon.     . Cholecalciferol (VITAMIN D3) 2000 units capsule Take 2,000 Units by mouth daily at 12 noon.     . clobetasol (TEMOVATE) 0.05 % external solution Apply 1 application topically daily as needed (psoriasis).     . fexofenadine (ALLEGRA) 180 MG tablet Take 180 mg by mouth at bedtime.    . hydrochlorothiazide (MICROZIDE) 12.5 MG capsule Take 12.5 mg by mouth daily.    Marland Kitchen levothyroxine (SYNTHROID, LEVOTHROID) 50 MCG tablet Take 50 mcg by mouth daily before breakfast.    . lisinopril (ZESTRIL) 10 MG tablet Take 10 mg by mouth daily.    . Multiple Vitamin (MULTIVITAMIN WITH MINERALS) TABS tablet Take 1 tablet by mouth daily at 12 noon.     . prasugrel (EFFIENT) 10 MG TABS tablet TAKE 1/2 TABLET BY MOUTH EVERY DAY 45 tablet 2  . triamcinolone cream (KENALOG) 0.1 % Apply 1 application topically daily as needed (psoriasis). Mixed with nystatin cream 1:1    . vitamin C (ASCORBIC ACID) 500 MG tablet Take 500 mg by mouth daily at 12 noon.     No current facility-administered medications for this visit.     Past Medical History:  Diagnosis Date  . Asthma   . CAD (coronary artery disease)    60-70% proximal LAD/diagonal bifurcational disease October 2015 at [facility] status  DES x 2 to LAD/diagonal October 2015  . Essential hypertension   . Hyperlipidemia   . Hypothyroidism     ROS:   All systems reviewed and negative except as noted in the HPI.   Past Surgical History:  Procedure Laterality Date  . ABDOMINAL HYSTERECTOMY    . ANAL FISSURE REPAIR    . CATARACT EXTRACTION W/PHACO Right 10/17/2014   Procedure: CATARACT EXTRACTION PHACO AND INTRAOCULAR LENS PLACEMENT ([place]);  Surgeon: Tonny Branch, MD;  Location: [facility] ORS;  Service: Ophthalmology;  Laterality: Right;  CDE: 4.88  . CATARACT EXTRACTION W/PHACO Left 10/27/2014   Procedure: CATARACT EXTRACTION PHACO AND INTRAOCULAR LENS PLACEMENT ([facility]);  Surgeon: Tonny Branch, MD;  Location: [facility] ORS;  Service: Ophthalmology;  Laterality: Left;  CDE:7.86  . COLONOSCOPY N/A 04/16/2018   Procedure: COLONOSCOPY;  Surgeon: Rogene [place], MD;  Location: [facility] ENDO SUITE;  Service: Endoscopy;  Laterality: N/A;  830  . FINGER SURGERY    . NASAL SINUS SURGERY    . PACEMAKER IMPLANT N/A 07/30/2016   Procedure: Pacemaker Implant Dual Chamber;  Surgeon: Evans Lance, MD;  Location: [place] CV LAB;  Service: Cardiovascular;  Laterality: N/A;  . TONSILLECTOMY       Family History  Problem Relation Age of  Onset  . Heart disease Father      Social History   Socioeconomic History  . Marital status: Widowed    Spouse name: Not on file  . Number of children: Not on file  . Years of education: Not on file  . Highest education level: Not on file  Occupational History  . Not on file  Tobacco Use  . Smoking status: Never Smoker  . Smokeless tobacco: Never Used  Vaping Use  . Vaping Use: Never used  Substance and Sexual Activity  . Alcohol use: No    Alcohol/week: 0.0 standard drinks  . Drug use: No  . Sexual activity: Never  Other Topics Concern  . Not on file  Social History Narrative  . Not on file   Social Determinants of Health   Financial Resource Strain:   . Difficulty of Paying Living Expenses:   Food  Insecurity:   . Worried About Charity fundraiser in the Last Year:   . Arboriculturist in the Last Year:   Transportation Needs:   . Film/video editor (Medical):   Marland Kitchen Lack of Transportation (Non-Medical):   Physical Activity:   . Days of Exercise per Week:   . Minutes of Exercise per Session:   Stress:   . Feeling of Stress :   Social Connections:   . Frequency of Communication with Friends and Family:   . Frequency of Social Gatherings with Friends and Family:   . Attends Religious Services:   . Active Member of Clubs or Organizations:   . Attends Archivist Meetings:   Marland Kitchen Marital Status:   Intimate Partner Violence:   . Fear of Current or Ex-Partner:   . Emotionally Abused:   Marland Kitchen Physically Abused:   . Sexually Abused:      BP (!) 144/68   Pulse 69   Ht 4' 11.5" (1.511 m)   Wt 151 lb (68.5 kg)   SpO2 94%   BMI 29.99 kg/m   Physical Exam:  Well appearing NAD HEENT: Unremarkable Neck:  No JVD, no thyromegally Lymphatics:  No adenopathy Back:  No CVA tenderness Lungs:  Clear with no wheezes HEART:  Regular rate rhythm, no murmurs, no rubs, no clicks Abd:  soft, positive bowel sounds, no organomegally, no rebound, no guarding Ext:  2 plus pulses, no edema, no cyanosis, no clubbing Skin:  No rashes no nodules Neuro:  CN II through XII intact, motor grossly intact  DEVICE  Normal device function.  See PaceArt for details.   Assess/Plan: 1. Sinus node dysfunction - she is asymptomatic, s/p PPM insertion.  2. PPM - her biotronik DDD PPM is working normally. We will recheck in several months. 3. HTN - her bp is minimally elevated but better at home.  4. Dyslipidemia - she has tolerated her statin therapy with no difficulty.  Mikle Bosworth.D.

## 2019-08-04 NOTE — Patient Instructions (Signed)
Medication Instructions:  Your physician recommends that you continue on your current medications as directed. Please refer to the Current Medication list given to you today.  *If you need a refill on your cardiac medications before your next appointment, please call your pharmacy*   Lab Work: NONE   If you have labs (blood work) drawn today and your tests are completely normal, you will receive your results only by: . MyChart Message (if you have MyChart) OR . A paper copy in the mail If you have any lab test that is abnormal or we need to change your treatment, we will call you to review the results.   Testing/Procedures: NONE    Follow-Up: At [facility], you and your health needs are our priority.  As part of our continuing mission to provide you with exceptional heart care, we have created designated Provider Care Teams.  These Care Teams include your primary Cardiologist (physician) and Advanced Practice Providers (APPs -  Physician Assistants and Nurse Practitioners) who all work together to provide you with the care you need, when you need it.  We recommend signing up for the patient portal called "MyChart".  Sign up information is provided on this After Visit Summary.  MyChart is used to connect with patients for Virtual Visits (Telemedicine).  Patients are able to view lab/test results, encounter notes, upcoming appointments, etc.  Non-urgent messages can be sent to your provider as well.   To learn more about what you can do with MyChart, go to [email or web address].    Your next appointment:   1 year(s)  The format for your next appointment:   In Person  Provider:   Gregg Taylor, MD   Other Instructions Thank you for choosing [facility]!    

## 2019-08-11 NOTE — Progress Notes (Addendum)
Cardiology Office Note  Date: 08/13/2019   ID: Hildreth, Orsak 26-Jan-1946, MRN [id number]  PCP:  [name], MD  Cardiologist:  [name], MD Electrophysiologist:  Cristopher Peru, MD   Chief Complaint: F/U  CAD, HTN, Pacemaker  History of Present Illness: Amy [name] is a 74 y.o. female with a history of HTN, Sinus node dysfunction s/p PPM, CAD (LAD/Diag. bifurcational disease 11/2013 DES x 2), HLD,Hypothyroidism.  Previously saw Dr Domenic Polite on 06/08/2019 for routine visit. No anginal symptoms since last visit. Lisinopril was increased to 5 mg daily. She continued on DAPT, atrorvastatin.   Recently saw Dr Lovena Le on 08/04/2019 for device check.  Normal device function of Biotronik DDD PPM. BP minimally elevated but better at home. Tolerating statin therapy.  Recent ER visit 07/27/2019. [facility] for elevated BP and HA. BP initially 238/101, at DC 174/92. CT head negative for acute process. Lisinopril increased to 10 mg daily by PCP. Telephone encounter with staff and Dr Domenic Polite conveyed by patient's daughter regarding recent ER visit. There was mention of possible addition of amlodipine if needed at follow up visit.  Patient's daughter came to the office on 07/28/2019 stating her mother had a bad headache with elevated blood pressure on 07/27/2019 with emergency room visit.  She was seen by Dr. Pleas Koch and he increased the patient's lisinopril to 10 mg that morning after the office visit.  She presents today after increased dose of lisinopril.  Her blood pressure remains elevated at 190/60.  She brings with her a log of blood pressures with systolic blood pressures ranging from 737T to 062I systolic.  Patient states she had previously been on lisinopril at a reduced dose due to it affecting her kidney function.  States she was previously on 5 mg and was reduced to 2.5 mg due to decreased renal function.  She states now she is back up to 10 mg and is concerned over its effect on her renal  function.  She denies any symptoms at all. She denies any anginal or exertional symptoms, palpitations or arrhythmias, orthostatic symptoms, CVA or TIA-like symptoms, PND or orthopnea, lower extremity edema.  States she seems to have increasing exertional fatigue with increasing dyspnea.   Past Medical History:  Diagnosis Date  . Asthma   . CAD (coronary artery disease)    60-70% proximal LAD/diagonal bifurcational disease October 2015 at [facility] status DES x 2 to LAD/diagonal October 2015  . Essential hypertension   . Hyperlipidemia   . Hypothyroidism     Past Surgical History:  Procedure Laterality Date  . ABDOMINAL HYSTERECTOMY    . ANAL FISSURE REPAIR    . CATARACT EXTRACTION W/PHACO Right 10/17/2014   Procedure: CATARACT EXTRACTION PHACO AND INTRAOCULAR LENS PLACEMENT ([place]);  Surgeon: Tonny Branch, MD;  Location: [facility] OR[facility];  Service: Ophthalmology;  Laterality: Right;  CDE: 4.88  . CATARACT EXTRACTION W/PHACO Left 10/27/2014   Procedure: CATARACT EXTRACTION PHACO AND INTRAOCULAR LENS PLACEMENT ([facility]);  Surgeon: Tonny Branch, MD;  Location: [facility];  Service: Ophthalmology;  Laterality: Left;  CDE:7.86  . COLONOSCOPY N/A 04/16/2018   Procedure: COLONOSCOPY;  Surgeon: Rogene Houston, MD;  Location: [facility];  Service: Endoscopy;  Laterality: N/A;  830  . FINGER SURGERY    . NASAL SINUS SURGERY    . PACEMAKER IMPLANT N/A 07/30/2016   Procedure: Pacemaker Implant Dual Chamber;  Surgeon: Evans Lance, MD;  Location: [place] CV LAB;  Service: Cardiovascular;  Laterality: N/A;  .  TONSILLECTOMY      Current Outpatient Medications  Medication Sig Dispense Refill  . acetaminophen (TYLENOL) 650 MG CR tablet Take 650 mg by mouth daily as needed for pain.    Marland Kitchen aspirin 81 MG tablet Take 81 mg by mouth daily.    Marland Kitchen atorvastatin (LIPITOR) 20 MG tablet Take 20 mg by mouth at bedtime.   3  . Biotin (BIOTIN 5000) 5 MG CAPS Take 5 mg by mouth daily at 12 noon.     . Cholecalciferol (VITAMIN D3)  2000 units capsule Take 2,000 Units by mouth daily at 12 noon.     . clobetasol (TEMOVATE) 0.05 % external solution Apply 1 application topically daily as needed (psoriasis).     . fexofenadine (ALLEGRA) 180 MG tablet Take 180 mg by mouth at bedtime.    . hydrochlorothiazide (MICROZIDE) 12.5 MG capsule Take 12.5 mg by mouth daily.    Marland Kitchen levothyroxine (SYNTHROID, LEVOTHROID) 50 MCG tablet Take 50 mcg by mouth daily before breakfast.    . lisinopril (ZESTRIL) 10 MG tablet Take 0.5 tablets (5 mg total) by mouth 2 (two) times daily. 90 tablet 1  . Multiple Vitamin (MULTIVITAMIN WITH MINERALS) TABS tablet Take 1 tablet by mouth daily at 12 noon.     . prasugrel (EFFIENT) 10 MG TABS tablet TAKE 1/2 TABLET BY MOUTH EVERY DAY 45 tablet 2  . triamcinolone cream (KENALOG) 0.1 % Apply 1 application topically daily as needed (psoriasis). Mixed with nystatin cream 1:1    . vitamin C (ASCORBIC ACID) 500 MG tablet Take 500 mg by mouth daily at 12 noon.    Marland Kitchen amLODipine (NORVASC) 5 MG tablet Take 1 tablet (5 mg total) by mouth daily. 90 tablet 1   No current facility-administered medications for this visit.   Allergies:  Sulfa antibiotics   Social History: The patient  reports that she has never smoked. She has never used smokeless tobacco. She reports that she does not drink alcohol and does not use drugs.   Family History: The patient's family history includes Heart disease in her father.   ROS:  Please see the history of present illness. Otherwise, complete review of systems is positive for none.  All other systems are reviewed and negative.   Physical Exam: VS:  BP (!) 190/60   Pulse 68   Ht 4' 11.5" (1.511 m)   Wt 155 lb 5.8 oz (70.5 kg)   SpO2 98%   BMI 30.85 kg/m , BMI Body mass index is 30.85 kg/m.  Wt Readings from Last 3 Encounters:  08/12/19 155 lb 5.8 oz (70.5 kg)  08/04/19 151 lb (68.5 kg)  06/08/19 153 lb (69.4 kg)    General: Patient appears comfortable at rest. Neck: Supple, no  elevated JVP, carotid bruit heard on right side, no thyromegaly. Lungs: Clear to auscultation, nonlabored breathing at rest. Cardiac: Regular rate and rhythm, no S3 or 2/6 systolic murmur heard best at left upper sternal border, no pericardial rub. Extremities: No pitting edema, distal pulses 2+. Skin: Warm and dry. Musculoskeletal: No kyphosis. Neuropsychiatric: Alert and oriented x3, affect grossly appropriate.  ECG:  EKG on 12/14/2018 showed sinus rhythm with a rate of 65.  Recent Labwork: No results found for requested labs within last 8760 hours.  No results found for: CHOL, TRIG, HDL, CHOLHDL, VLDL, LDLCALC, LDLDIRECT  Other Studies Reviewed Today:  [facility] LVEF 55 to 60%, mild left atrial enlargement, normal right ventricular contraction, mildly thickened aortic leaflets, mildly thickened mitral leaflets  with trace mitral regurgitation, trace tricuspid regurgitation, no pericardial effusion.  Assessment and Plan:  1. Sinus node dysfunction (HCC)   2. Essential hypertension   3. Mixed hyperlipidemia   4. SOB (shortness of breath)   5. Right carotid bruit     1. Sinus node dysfunction [facility]) Recently saw Dr Lovena Le on 08/04/2019 for device check.  Normal device function of Biotronik DDD PPM.   2. Essential hypertension Blood pressure remains elevated in spite of increasing doses of lisinopril blood pressure today 190/60.  She brings with her a log of blood pressures with systolics ranging from 361Q to 170s.  Asked her to split the lisinopril dosage up to into 2 daily doses 5 mg 1 in a.m. and 5 mg 1 in p.m. Add amlodipine 5 mg in a.m. daily.  Continue HCTZ 12.5 mg daily.  Measure your blood pressures daily approximately 2 to 3 hours after taking your medication.  Follow-up in 2 weeks with a nurse visit and bring the log of blood pressure measurements with you.    3. Mixed hyperlipidemia Lipid profile from 12/08/2018 showed total  cholesterol 163, triglycerides 56, HDL 84, LDL 68 patient is taking red yeast rice 600 mg capsules daily and fish oil 1000 mg capsule (3 capsules) daily  4.  DOE/exertional fatigue. Patient admits to increased dyspnea on exertion with exertional fatigue recently, worse over the last few months.  Get a repeat echocardiogram to assess LV function, diastolic function, and valvular function.  5.  Carotid bruit right Carotid bruit heard during auscultation of right carotid artery.  Please get a carotid artery duplex bilateral.  Medication Adjustments/Labs and Tests Ordered: Current medicines are reviewed at length with the patient today.  Concerns regarding medicines are outlined above.   Disposition: Follow-up with Dr. Domenic Polite or APP 1 month  Signed, Levell July, NP 08/13/2019 6:17 AM    [facility] [place], [place], [place] [address] Phone: [phone]; Fax: [phone]

## 2019-08-12 ENCOUNTER — Ambulatory Visit (INDEPENDENT_AMBULATORY_CARE_PROVIDER_SITE_OTHER): Payer: Medicare Other | Admitting: Family Medicine

## 2019-08-12 ENCOUNTER — Telehealth: Payer: Self-pay | Admitting: Family Medicine

## 2019-08-12 ENCOUNTER — Other Ambulatory Visit: Payer: Self-pay

## 2019-08-12 ENCOUNTER — Encounter: Payer: Self-pay | Admitting: Family Medicine

## 2019-08-12 VITALS — BP 190/60 | HR 68 | Ht 59.5 in | Wt 155.4 lb

## 2019-08-12 DIAGNOSIS — R0602 Shortness of breath: Secondary | ICD-10-CM | POA: Diagnosis not present

## 2019-08-12 DIAGNOSIS — R0989 Other specified symptoms and signs involving the circulatory and respiratory systems: Secondary | ICD-10-CM

## 2019-08-12 DIAGNOSIS — I10 Essential (primary) hypertension: Secondary | ICD-10-CM

## 2019-08-12 DIAGNOSIS — I495 Sick sinus syndrome: Secondary | ICD-10-CM

## 2019-08-12 DIAGNOSIS — E782 Mixed hyperlipidemia: Secondary | ICD-10-CM | POA: Diagnosis not present

## 2019-08-12 MED ORDER — AMLODIPINE BESYLATE 5 MG PO TABS: 5.0000 mg | ORAL_TABLET | Freq: Every day | ORAL | 1 refills | Status: DC

## 2019-08-12 MED ORDER — LISINOPRIL 10 MG PO TABS: 5.0000 mg | ORAL_TABLET | Freq: Two times a day (BID) | ORAL | 1 refills | Status: DC

## 2019-08-12 NOTE — Telephone Encounter (Signed)
Pre-cert Verification for the following procedure    Echo & Dopplers   DATE:  08/20/2019    [place]

## 2019-08-12 NOTE — Patient Instructions (Addendum)
Medication Instructions:   Your physician has recommended you make the following change in your medication:   Change lisinopril 10 mg to 1/2 tablet twice daily  Start amlodipine 5 mg by mouth daily in the morning  Continue other medications the same  Labwork:  NONE  Testing/Procedures: Your physician has requested that you have a carotid duplex. This test is an ultrasound of the carotid arteries in your neck. It looks at blood flow through these arteries that supply the brain with blood. Allow one hour for this exam. There are no restrictions or special instructions. Your physician has requested that you have an echocardiogram. Echocardiography is a painless test that uses sound waves to create images of your heart. It provides your doctor with information about the size and shape of your heart and how well your heart's chambers and valves are working. This procedure takes approximately one hour. There are no restrictions for this procedure.  Follow-Up:  Your physician recommends that you schedule a follow-up appointment in: 1 month (office).  Your physician recommends that you schedule a follow-up appointment in: 2 weeks for a nurse visit to recheck your blood pressure  Any Other Special Instructions Will Be Listed Below (If Applicable). Your physician has requested that you regularly monitor and record your blood pressure readings at home. Please use the same machine at the same time of day to check your readings and record them to bring to your follow-up visit.   If you need a refill on your cardiac medications before your next appointment, please call your pharmacy.

## 2019-08-19 ENCOUNTER — Other Ambulatory Visit (HOSPITAL_COMMUNITY): Payer: Medicare Other

## 2019-08-20 ENCOUNTER — Ambulatory Visit (HOSPITAL_COMMUNITY)
Admission: RE | Admit: 2019-08-20 | Discharge: 2019-08-20 | Disposition: A | Discharge status: Home / Self Care | Payer: Medicare Other | Source: Ambulatory Visit | Attending: Family Medicine | Admitting: Family Medicine

## 2019-08-20 ENCOUNTER — Other Ambulatory Visit: Payer: Self-pay

## 2019-08-20 ENCOUNTER — Ambulatory Visit (HOSPITAL_BASED_OUTPATIENT_CLINIC_OR_DEPARTMENT_OTHER)
Admission: RE | Admit: 2019-08-20 | Discharge: 2019-08-20 | Disposition: A | Discharge status: Home / Self Care | Payer: Medicare Other | Source: Ambulatory Visit | Attending: Family Medicine | Admitting: Family Medicine

## 2019-08-20 DIAGNOSIS — I6522 Occlusion and stenosis of left carotid artery: Secondary | ICD-10-CM | POA: Diagnosis not present

## 2019-08-20 DIAGNOSIS — R0989 Other specified symptoms and signs involving the circulatory and respiratory systems: Secondary | ICD-10-CM | POA: Insufficient documentation

## 2019-08-20 DIAGNOSIS — E785 Hyperlipidemia, unspecified: Secondary | ICD-10-CM | POA: Diagnosis not present

## 2019-08-20 DIAGNOSIS — I2510 Atherosclerotic heart disease of native coronary artery without angina pectoris: Secondary | ICD-10-CM | POA: Insufficient documentation

## 2019-08-20 DIAGNOSIS — I7090 Unspecified atherosclerosis: Secondary | ICD-10-CM | POA: Diagnosis not present

## 2019-08-20 DIAGNOSIS — I6523 Occlusion and stenosis of bilateral carotid arteries: Secondary | ICD-10-CM | POA: Diagnosis not present

## 2019-08-20 DIAGNOSIS — I10 Essential (primary) hypertension: Secondary | ICD-10-CM | POA: Insufficient documentation

## 2019-08-20 DIAGNOSIS — R0602 Shortness of breath: Secondary | ICD-10-CM | POA: Insufficient documentation

## 2019-08-20 DIAGNOSIS — R002 Palpitations: Secondary | ICD-10-CM | POA: Insufficient documentation

## 2019-08-20 LAB — ECHOCARDIOGRAM COMPLETE
AR max vel: 1.2 cm2
AV Area VTI: 1.12 cm2
AV Area mean vel: 1.22 cm2
AV Mean grad: 10 mmHg
AV Peak grad: 18.8 mmHg
Ao pk vel: 2.17 m/s
Area-P 1/2: 2.42 cm2
S' Lateral: 1.89 cm

## 2019-08-20 NOTE — Progress Notes (Signed)
*  PRELIMINARY RESULTS* Echocardiogram 2D Echocardiogram has been performed.  Samuel Germany 08/20/2019, 2:55 PM

## 2019-08-24 ENCOUNTER — Telehealth: Payer: Self-pay | Admitting: Family Medicine

## 2019-08-24 NOTE — Telephone Encounter (Signed)
Pt voiced understanding      Please call the patient and let her know the carotid artery study shows she has a moderate amount of carotid plaque in both carotid arteries. The left carotid artery shows 50-69% stenosis. The right side does not show any significant stenosis. A CTA of carotids was suggested if clinically indicated. She has no neuro symptoms. I am going to send this note to Dr Domenic Polite to get his input. Thanks   Please call the patient and let her know the echocardiogram showed her heart has good pumping function. Her left ventricle is a little stiff and muscular and has problems relaxing when the ventricle is trying to fill.(Grade 1 diastolic dysfunction) This may account for some of her DOE/SOB. Best management of this is controlling blood pressure better (which we are working on) and managing risk factors. She has some very mild aortic stenosis (narrowing of the valve opening) This could also contribute some to DOE / SOB but currently very mild. Thanks

## 2019-08-24 NOTE — Telephone Encounter (Signed)
Patient is returning a call to [employer].

## 2019-08-26 ENCOUNTER — Ambulatory Visit: Payer: Medicare Other | Admitting: *Deleted

## 2019-08-26 ENCOUNTER — Encounter: Payer: Self-pay | Admitting: *Deleted

## 2019-08-26 VITALS — BP 124/58

## 2019-08-26 DIAGNOSIS — I10 Essential (primary) hypertension: Secondary | ICD-10-CM

## 2019-08-26 NOTE — Progress Notes (Signed)
Her blood pressures look great.  I understand that maybe the decrease in blood pressure may make her feel a little fatigued.  Hopefully this is just a transient issue.  Once she gets used to the blood pressure being in the normal range this should probably dissipate over time.  Just let her know the blood pressures look great.  Thank you

## 2019-08-26 NOTE — Progress Notes (Signed)
Patient in office for nurse BP check this morning.  States that she forgot her log book, but we could request readings from her pcp.  States that she has BP monitor at home that sends the office her readings.  Will send request to Dr. Pleas Koch this morning.    BP this morning -   Right - 124/52 Left - 124/58  HR 74  98%   Stated that she has taken her medications about an hour ago.  States that she does notice feeling more tired when she has these lower readings.    Follow up scheduled with Jonni Sanger on 09/03/2019.

## 2019-08-27 NOTE — Progress Notes (Signed)
Patient notified.  Stated her bottom number was 48 this morning per her monitoring machine from her pcp.  Informed her that I have already requested those readings, but not received yet.  She will call & request they send to [place] also.  Patient has follow up scheduled for 09/03/2019 with Jonni Sanger.

## 2019-08-31 DIAGNOSIS — L280 Lichen simplex chronicus: Secondary | ICD-10-CM | POA: Diagnosis not present

## 2019-08-31 DIAGNOSIS — L900 Lichen sclerosus et atrophicus: Secondary | ICD-10-CM | POA: Diagnosis not present

## 2019-09-03 ENCOUNTER — Encounter: Payer: Self-pay | Admitting: *Deleted

## 2019-09-03 ENCOUNTER — Encounter: Payer: Self-pay | Admitting: Family Medicine

## 2019-09-03 ENCOUNTER — Ambulatory Visit (INDEPENDENT_AMBULATORY_CARE_PROVIDER_SITE_OTHER): Payer: Medicare Other | Admitting: Family Medicine

## 2019-09-03 VITALS — BP 160/62 | HR 72 | Ht 59.5 in | Wt 155.4 lb

## 2019-09-03 DIAGNOSIS — R0600 Dyspnea, unspecified: Secondary | ICD-10-CM | POA: Diagnosis not present

## 2019-09-03 DIAGNOSIS — R0609 Other forms of dyspnea: Secondary | ICD-10-CM

## 2019-09-03 DIAGNOSIS — E782 Mixed hyperlipidemia: Secondary | ICD-10-CM | POA: Diagnosis not present

## 2019-09-03 DIAGNOSIS — I5032 Chronic diastolic (congestive) heart failure: Secondary | ICD-10-CM | POA: Diagnosis not present

## 2019-09-03 DIAGNOSIS — I10 Essential (primary) hypertension: Secondary | ICD-10-CM | POA: Diagnosis not present

## 2019-09-03 DIAGNOSIS — I495 Sick sinus syndrome: Secondary | ICD-10-CM

## 2019-09-03 DIAGNOSIS — R0989 Other specified symptoms and signs involving the circulatory and respiratory systems: Secondary | ICD-10-CM

## 2019-09-03 DIAGNOSIS — E7849 Other hyperlipidemia: Secondary | ICD-10-CM | POA: Diagnosis not present

## 2019-09-03 DIAGNOSIS — E039 Hypothyroidism, unspecified: Secondary | ICD-10-CM | POA: Diagnosis not present

## 2019-09-03 DIAGNOSIS — I110 Hypertensive heart disease with heart failure: Secondary | ICD-10-CM | POA: Diagnosis not present

## 2019-09-03 MED ORDER — AMLODIPINE BESYLATE 5 MG PO TABS: 2.5000 mg | ORAL_TABLET | Freq: Two times a day (BID) | ORAL | 1 refills | Status: DC

## 2019-09-03 NOTE — Progress Notes (Addendum)
Cardiology Office Note  Date: 09/03/2019   ID: Marlis, Oldaker Aug 14, 1945, MRN [id number]  PCP:  [name], MD  Cardiologist:  [name], MD Electrophysiologist:  Cristopher Peru, MD   Chief Complaint: F/U  CAD, HTN, Pacemaker  History of Present Illness: TANIEKA POWNALL is a 74 y.o. female with a history of HTN, Sinus node dysfunction s/p PPM, CAD (LAD/Diag. bifurcational disease 11/2013 DES x 2), HLD,Hypothyroidism.  Previously saw Dr Domenic Polite on 06/08/2019 for routine visit. No anginal symptoms since last visit. Lisinopril was increased to 5 mg daily. She continued on DAPT, atrorvastatin.   Recently saw Dr Lovena Le on 08/04/2019 for device check.  Normal device function of Biotronik DDD PPM. BP minimally elevated but better at home. Tolerating statin therapy.  Recent ER visit 07/27/2019. [facility] for elevated BP and HA. BP initially 238/101, at DC 174/92. CT head negative for acute process. Lisinopril increased to 10 mg daily by PCP. Telephone encounter with staff and Dr Domenic Polite conveyed by patient's daughter regarding recent ER visit. There was mention of possible addition of amlodipine if needed at follow up visit.  Patient's daughter came to the office on 07/28/2019 stating her mother had a bad headache with elevated blood pressure on 07/27/2019 with emergency room visit.  She was seen by Dr. Pleas Koch and he increased the patient's lisinopril to 10 mg that morning after the office visit.  She presented last visit after increased dose of lisinopril.  Her blood pressure remained elevated at 190/60.  She brought with her a log of blood pressures with systolic blood pressures ranging from 599J to 570V systolic.  Patient stated she had previously been on lisinopril at a reduced dose due to it affecting her kidney function.  Stated she was previously on 5 mg and was reduced to 2.5 mg due to decreased renal function.  She stated currently she was back up to 10 mg and was concerned over its  effect on her renal function.  She denied any symptoms at all. She denied any anginal or exertional symptoms, palpitations or arrhythmias, orthostatic symptoms, CVA or TIA-like symptoms, PND or orthopnea, lower extremity edema.  Stated she seemed to have increasing exertional fatigue with increasing dyspnea.  She is here for 1 month follow-up.  States her blood pressures are better but her afternoon blood pressures seem to be elevated.  She denies any other issues since recent follow-up.  No palpitations or arrhythmias, orthostatic symptoms, CVA or TIA-like symptoms, PND, orthopnea, bleeding, DVT or PE-like symptoms, or lower extremity edema.  Continues with some exertional fatigue and mild dyspnea on exertion.  We went over the results of her echocardiogram and carotid artery Doppler studies..  She verbalizes understanding.  Past Medical History:  Diagnosis Date   Asthma    CAD (coronary artery disease)    60-70% proximal LAD/diagonal bifurcational disease October 2015 at [facility] status DES x 2 to LAD/diagonal October 2015   Essential hypertension    Hyperlipidemia    Hypothyroidism     Past Surgical History:  Procedure Laterality Date   ABDOMINAL HYSTERECTOMY     ANAL FISSURE REPAIR     CATARACT EXTRACTION W/PHACO Right 10/17/2014   Procedure: CATARACT EXTRACTION PHACO AND INTRAOCULAR LENS PLACEMENT ([place]);  Surgeon: Tonny Branch, MD;  Location: [facility];  Service: Ophthalmology;  Laterality: Right;  CDE: 4.88   CATARACT EXTRACTION W/PHACO Left 10/27/2014   Procedure: CATARACT EXTRACTION PHACO AND INTRAOCULAR LENS PLACEMENT ([facility]);  Surgeon: Tonny Branch, MD;  Location:  [facility];  Service: Ophthalmology;  Laterality: Left;  CDE:7.86   COLONOSCOPY N/A 04/16/2018   Procedure: COLONOSCOPY;  Surgeon: Rogene Houston, MD;  Location: [facility];  Service: Endoscopy;  Laterality: N/A;  [place]     NASAL SINUS SURGERY     PACEMAKER IMPLANT N/A 07/30/2016   Procedure: Pacemaker  Implant Dual Chamber;  Surgeon: Evans Lance, MD;  Location: [facility] [facility];  Service: Cardiovascular;  Laterality: N/A;   TONSILLECTOMY      Current Outpatient Medications  Medication Sig Dispense Refill   acetaminophen (TYLENOL) 650 MG CR tablet Take 650 mg by mouth daily as needed for pain.     amLODipine (NORVASC) 5 MG tablet Take 0.5 tablets (2.5 mg total) by mouth 2 (two) times daily. 90 tablet 1   aspirin 81 MG tablet Take 81 mg by mouth daily.     atorvastatin (LIPITOR) 20 MG tablet Take 20 mg by mouth at bedtime.   3   Biotin (BIOTIN 5000) 5 MG CAPS Take 5 mg by mouth daily at 12 noon.      Cholecalciferol (VITAMIN D3) 2000 units capsule Take 2,000 Units by mouth daily at 12 noon.      clobetasol (TEMOVATE) 0.05 % external solution Apply 1 application topically daily as needed (psoriasis).      fexofenadine (ALLEGRA) 180 MG tablet Take 180 mg by mouth at bedtime.     hydrochlorothiazide (MICROZIDE) 12.5 MG capsule Take 12.5 mg by mouth daily.     levothyroxine (SYNTHROID, LEVOTHROID) 50 MCG tablet Take 50 mcg by mouth daily before breakfast.     lisinopril (ZESTRIL) 10 MG tablet Take 0.5 tablets (5 mg total) by mouth 2 (two) times daily. 90 tablet 1   Multiple Vitamin (MULTIVITAMIN WITH MINERALS) TABS tablet Take 1 tablet by mouth daily at 12 noon.      prasugrel (EFFIENT) 10 MG TABS tablet TAKE 1/2 TABLET BY MOUTH EVERY DAY 45 tablet 2   triamcinolone cream (KENALOG) 0.1 % Apply 1 application topically daily as needed (psoriasis). Mixed with nystatin cream 1:1     vitamin C (ASCORBIC ACID) 500 MG tablet Take 500 mg by mouth daily at 12 noon.     No current facility-administered medications for this visit.   Allergies:  Sulfa antibiotics   Social History: The patient  reports that she has never smoked. She has never used smokeless tobacco. She reports that she does not drink alcohol and does not use drugs.   Family History: The patient's family history  includes Heart disease in her father.   ROS:  Please see the history of present illness. Otherwise, complete review of systems is positive for none.  All other systems are reviewed and negative.   Physical Exam: VS:  BP (!) 160/62    Pulse 72    Ht 4' 11.5" (1.511 m)    Wt 155 lb 6.4 oz (70.5 kg)    SpO2 99%    BMI 30.86 kg/m , BMI Body mass index is 30.86 kg/m.  Wt Readings from Last 3 Encounters:  09/03/19 155 lb 6.4 oz (70.5 kg)  08/12/19 155 lb 5.8 oz (70.5 kg)  08/04/19 151 lb (68.5 kg)    General: Patient appears comfortable at rest. Neck: Supple, no elevated JVP, carotid bruit heard on right side, no thyromegaly. Lungs: Clear to auscultation, nonlabored breathing at rest. Cardiac: Regular rate and rhythm, no S3 or 2/6 systolic murmur heard best at left upper sternal  border, no pericardial rub. Extremities: No pitting edema, distal pulses 2+. Skin: Warm and dry. Musculoskeletal: No kyphosis. Neuropsychiatric: Alert and oriented x3, affect grossly appropriate.  ECG:  EKG on 12/14/2018 showed sinus rhythm with a rate of 65.  Recent Labwork: No results found for requested labs within last 8760 hours.  No results found for: CHOL, TRIG, HDL, CHOLHDL, VLDL, LDLCALC, LDLDIRECT  Other Studies Reviewed Today:  Carotid duplex 08/20/2019  IMPRESSION: 1. Moderate amount of left-sided atherosclerotic plaque results in elevated peak systolic velocities within the left internal carotid artery compatible with the 50-69% luminal narrowing range. Further evaluation with CTA could performed as clinically indicated. 2. Moderate amount of right-sided atherosclerotic plaque, not resulting in a hemodynamically significant stenosis.   Echocardiogram 08/20/2019  1. Left ventricular ejection fraction, by estimation, is >75%. The left ventricle has hyperdynamic function. The left ventricle has no regional wall motion abnormalities. There is mild left ventricular hypertrophy. Left ventricular  diastolic parameters are consistent with Grade I diastolic dysfunction (impaired relaxation). 2. Right ventricular systolic function is normal. The right ventricular size is normal. There is normal pulmonary artery systolic pressure. 3. Left atrial size was mildly dilated. 4. The mitral valve is abnormal. Trivial mitral valve regurgitation. 5. AV is thickened, calcified with minimally restricted motion. Peak and mean gradients through the valve are 19 and 10 mm Hg respectively consistent with very mild AS.Marland Kitchen The aortic valve is tricuspid. Aortic valve regurgitation is not visualized.   [facility] LVEF 55 to 60%, mild left atrial enlargement, normal right ventricular contraction, mildly thickened aortic leaflets, mildly thickened mitral leaflets with trace mitral regurgitation, trace tricuspid regurgitation, no pericardial effusion.  Assessment and Plan:    1. Sinus node dysfunction [facility]) Recently saw Dr Lovena Le on 08/04/2019 for device check.  Normal device function of Biotronik DDD PPM.   2. Essential hypertension Today she brings a log of blood pressures with her with much better blood pressures in the 086V to 784O systolic.  She states the afternoon blood pressures are seem to elevate.  Advised her to split the amlodipine into an a.m. dose of 2.5 mg and an afternoon dose around 2 PM to see if this will help with afternoon blood pressure elevation.  She continues splitting lisinopril doses to 5 mg a.m. and 5 mg p.m.  She is continuing HCTZ 12.5 mg daily.  We are going to attempt to obtain from Dr. Lizbeth Bark office  recent lab work.  Patient states she was told it was faxed to [place].  We do not have those records on hand and they are not in care everywhere.  3. Mixed hyperlipidemia Lipid profile from 12/08/2018 showed total cholesterol 163, triglycerides 56, HDL 84, LDL 68 patient is taking red yeast rice 600 mg capsules daily and fish oil 1000 mg  capsule (3 capsules) daily  4.  DOE/exertional fatigue. Patient admits to increased dyspnea on exertion with exertional fatigue recently, worse over the last few months.   Recent echocardiogram 08/20/2019 showed EF greater than 75%.  Hyperdynamic function, no WMA's, G1 DD, trivial MR, very mild aortic stenosis  5.  Carotid bruit right Recent carotid artery duplex  study 08/20/2019 showed 50 to 69% luminal narrowing in left ICA.  Moderate amount of right-sided plaque but no hemodynamically significant stenosis.  Medication Adjustments/Labs and Tests Ordered: Current medicines are reviewed at length with the patient today.  Concerns regarding medicines are outlined above.   Disposition: Follow-up with Dr. Domenic Polite October 4  Signed, Levell July,  NP 09/03/2019 2:31 PM    [facility] at [place], [place], [place] [address] Phone: [phone]; Fax: [phone]

## 2019-09-03 NOTE — Patient Instructions (Signed)
Medication Instructions:   Your physician has recommended you make the following change in your medication:  Please take 1/2 of your amlodipine in the morning and second dose around 2:00 pm  Continue other medications the same  Labwork:  NONE  Testing/Procedures:  NONE  Follow-Up:  As planned in October 2021 with Dr. Domenic Polite.  Any Other Special Instructions Will Be Listed Below (If Applicable).  If you need a refill on your cardiac medications before your next appointment, please call your pharmacy.

## 2019-09-13 ENCOUNTER — Telehealth: Payer: Self-pay | Admitting: Cardiology

## 2019-09-13 NOTE — Telephone Encounter (Signed)
Amy [name], would like to know if labs were rec'd and if there are further instrctions.   Please call [name] [phone]   Thanks renee

## 2019-09-13 NOTE — Telephone Encounter (Signed)
Ok Sorry

## 2019-09-13 NOTE — Telephone Encounter (Signed)
Her renal function looks good.  Her cholesterol looks good.  Her TSH is a little elevated.  Her PCP may want to check her T3 and T4 to make sure her thyroid medication does not need to be adjusted.  Thanks

## 2019-09-14 NOTE — Telephone Encounter (Signed)
Response faxed to [facility] at [employer]

## 2019-10-05 DIAGNOSIS — E039 Hypothyroidism, unspecified: Secondary | ICD-10-CM | POA: Diagnosis not present

## 2019-10-05 DIAGNOSIS — I110 Hypertensive heart disease with heart failure: Secondary | ICD-10-CM | POA: Diagnosis not present

## 2019-10-05 DIAGNOSIS — E7849 Other hyperlipidemia: Secondary | ICD-10-CM | POA: Diagnosis not present

## 2019-10-05 DIAGNOSIS — I5032 Chronic diastolic (congestive) heart failure: Secondary | ICD-10-CM | POA: Diagnosis not present

## 2019-10-28 ENCOUNTER — Ambulatory Visit (INDEPENDENT_AMBULATORY_CARE_PROVIDER_SITE_OTHER): Payer: Medicare Other | Admitting: Emergency Medicine

## 2019-10-28 DIAGNOSIS — I495 Sick sinus syndrome: Secondary | ICD-10-CM | POA: Diagnosis not present

## 2019-10-28 LAB — CUP PACEART REMOTE DEVICE CHECK
Battery Remaining Percentage: 75 %
Brady Statistic RA Percent Paced: 88 %
Brady Statistic RV Percent Paced: 0 %
Date Time Interrogation Session: 20210923072335
Implantable Lead Implant Date: 20180626
Implantable Lead Implant Date: 20180626
Implantable Lead Location: 753859
Implantable Lead Location: 753860
Implantable Lead Model: 377
Implantable Lead Model: 377
Implantable Lead Serial Number: 49791993
Implantable Lead Serial Number: 49865965
Implantable Pulse Generator Implant Date: 20180626
Lead Channel Impedance Value: 488 Ohm
Lead Channel Impedance Value: 566 Ohm
Lead Channel Pacing Threshold Amplitude: 0.9 V
Lead Channel Pacing Threshold Amplitude: 1 V
Lead Channel Pacing Threshold Pulse Width: 0.4 ms
Lead Channel Pacing Threshold Pulse Width: 0.4 ms
Lead Channel Sensing Intrinsic Amplitude: 11.2 mV
Lead Channel Sensing Intrinsic Amplitude: 3.8 mV
Lead Channel Setting Pacing Amplitude: 2 V
Lead Channel Setting Pacing Amplitude: 2.4 V
Lead Channel Setting Pacing Pulse Width: 0.4 ms
Pulse Gen Model: 407145
Pulse Gen Serial Number: 69052081

## 2019-11-02 NOTE — Progress Notes (Signed)
Remote pacemaker transmission.   

## 2019-11-04 DIAGNOSIS — I110 Hypertensive heart disease with heart failure: Secondary | ICD-10-CM | POA: Diagnosis not present

## 2019-11-04 DIAGNOSIS — I5032 Chronic diastolic (congestive) heart failure: Secondary | ICD-10-CM | POA: Diagnosis not present

## 2019-11-04 DIAGNOSIS — E7849 Other hyperlipidemia: Secondary | ICD-10-CM | POA: Diagnosis not present

## 2019-11-05 ENCOUNTER — Telehealth: Payer: Self-pay | Admitting: Internal Medicine

## 2019-11-05 NOTE — Telephone Encounter (Signed)
Called patient to remind of appt with Dr Domenic Polite on Mon 11/08/19 at 1:40pm.  Amy [name] confirmed that Amy [name] will be there.

## 2019-11-08 ENCOUNTER — Ambulatory Visit (INDEPENDENT_AMBULATORY_CARE_PROVIDER_SITE_OTHER): Payer: Medicare Other | Admitting: Cardiology

## 2019-11-08 ENCOUNTER — Encounter: Payer: Self-pay | Admitting: Cardiology

## 2019-11-08 VITALS — BP 150/60 | HR 66 | Ht 59.5 in | Wt 156.0 lb

## 2019-11-08 DIAGNOSIS — I25119 Atherosclerotic heart disease of native coronary artery with unspecified angina pectoris: Secondary | ICD-10-CM

## 2019-11-08 DIAGNOSIS — I10 Essential (primary) hypertension: Secondary | ICD-10-CM | POA: Diagnosis not present

## 2019-11-08 DIAGNOSIS — I495 Sick sinus syndrome: Secondary | ICD-10-CM

## 2019-11-08 NOTE — Progress Notes (Signed)
Cardiology Office Note  Date: 11/08/2019   ID: Mishal, Probert March 16, 1945, MRN [id number]  PCP:  [name], MD  Cardiologist:  [name], MD Electrophysiologist:  Cristopher Peru, MD   Chief Complaint  Patient presents with  . Cardiac follow-up    History of Present Illness: Amy [name] is a 74 y.o. female last seen in July by Mr. Leonides Sake NP.  She presents for a routine visit.  Reports chronic fatigue and weakness, no obvious angina symptoms or palpitations.  Still functional with ADLs.  She sees Dr. Lovena Le, [place] pacemaker in place.  Device interrogation from September revealed normal function.  Blood pressure recordings noted from September, scanned into the chart.  Average systolics have been under 150.  We went over her medications which are listed below.  She states that she has follow-up scheduled with Dr. Pleas Koch in November with full lab work.  Her creatinine was 0.82 with normal potassium back in May, LDL 55 at that time.  I personally reviewed her ECG today which shows an atrial paced rhythm.  Past Medical History:  Diagnosis Date  . Asthma   . CAD (coronary artery disease)    60-70% proximal LAD/diagonal bifurcational disease October 2015 at [facility] status DES x 2 to LAD/diagonal October 2015  . Essential hypertension   . Hyperlipidemia   . Hypothyroidism     Past Surgical History:  Procedure Laterality Date  . ABDOMINAL HYSTERECTOMY    . ANAL FISSURE REPAIR    . CATARACT EXTRACTION W/PHACO Right 10/17/2014   Procedure: CATARACT EXTRACTION PHACO AND INTRAOCULAR LENS PLACEMENT ([place]);  Surgeon: Tonny Branch, MD;  Location: [facility];  Service: Ophthalmology;  Laterality: Right;  CDE: 4.88  . CATARACT EXTRACTION W/PHACO Left 10/27/2014   Procedure: CATARACT EXTRACTION PHACO AND INTRAOCULAR LENS PLACEMENT ([facility]);  Surgeon: Tonny Branch, MD;  Location: [facility];  Service: Ophthalmology;  Laterality: Left;  CDE:7.86  . COLONOSCOPY N/A 04/16/2018   Procedure:  COLONOSCOPY;  Surgeon: Rogene Houston, MD;  Location: [facility];  Service: Endoscopy;  Laterality: N/A;  830  . FINGER SURGERY    . NASAL SINUS SURGERY    . PACEMAKER IMPLANT N/A 07/30/2016   Procedure: Pacemaker Implant Dual Chamber;  Surgeon: Evans Lance, MD;  Location: [facility];  Service: Cardiovascular;  Laterality: N/A;  . TONSILLECTOMY      Current Outpatient Medications  Medication Sig Dispense Refill  . acetaminophen (TYLENOL) 650 MG CR tablet Take 650 mg by mouth daily as needed for pain.    Marland Kitchen amLODipine (NORVASC) 5 MG tablet Take 0.5 tablets (2.5 mg total) by mouth 2 (two) times daily. 90 tablet 1  . aspirin 81 MG tablet Take 81 mg by mouth daily.    Marland Kitchen atorvastatin (LIPITOR) 20 MG tablet Take 20 mg by mouth at bedtime.   3  . Biotin (BIOTIN 5000) 5 MG CAPS Take 5 mg by mouth daily at 12 noon.     . Cholecalciferol (VITAMIN D3) 2000 units capsule Take 2,000 Units by mouth daily at 12 noon.     . clobetasol (TEMOVATE) 0.05 % external solution Apply 1 application topically daily as needed (psoriasis).     . fexofenadine (ALLEGRA) 180 MG tablet Take 180 mg by mouth at bedtime.    . hydrochlorothiazide (MICROZIDE) 12.5 MG capsule Take 12.5 mg by mouth daily.    Marland Kitchen levothyroxine (SYNTHROID, LEVOTHROID) 50 MCG tablet Take 50 mcg by mouth daily before breakfast.    .  lisinopril (ZESTRIL) 10 MG tablet Take 0.5 tablets (5 mg total) by mouth 2 (two) times daily. 90 tablet 1  . Multiple Vitamin (MULTIVITAMIN WITH MINERALS) TABS tablet Take 1 tablet by mouth daily at 12 noon.     . prasugrel (EFFIENT) 10 MG TABS tablet TAKE 1/2 TABLET BY MOUTH EVERY DAY 45 tablet 2  . triamcinolone cream (KENALOG) 0.1 % Apply 1 application topically daily as needed (psoriasis). Mixed with nystatin cream 1:1    . vitamin C (ASCORBIC ACID) 500 MG tablet Take 500 mg by mouth daily at 12 noon.     No current facility-administered medications for this visit.   Allergies:  Sulfa antibiotics    ROS:  No syncope.  Physical Exam: VS:  BP (!) 150/60   Pulse 66   Ht 4' 11.5" (1.511 m)   Wt 156 lb (70.8 kg)   SpO2 98%   BMI 30.98 kg/m , BMI Body mass index is 30.98 kg/m.  Wt Readings from Last 3 Encounters:  11/08/19 156 lb (70.8 kg)  09/03/19 155 lb 6.4 oz (70.5 kg)  08/12/19 155 lb 5.8 oz (70.5 kg)    General: Elderly woman.  Patient appears comfortable at rest. HEENT: Conjunctiva and lids normal, wearing a mask. Neck: Supple, no elevated JVP or carotid bruits, no thyromegaly. Lungs: Clear to auscultation, nonlabored breathing at rest. Cardiac: Regular rate and rhythm, no S3, 2/6 systolic murmur. Extremities: No pitting edema, distal pulses 2+.  ECG:  An ECG dated 12/24/2018 was personally reviewed today and demonstrated:  Normal sinus rhythm.  Recent Labwork:  May 2021: BUN 20, creatinine 0.82, potassium 4.3, AST 23, ALT 19, cholesterol 132, HDL 68, LDL 55, TSH 6.32  Other Studies Reviewed Today:  Echocardiogram 08/20/2019: 1. Left ventricular ejection fraction, by estimation, is >75%. The left  ventricle has hyperdynamic function. The left ventricle has no regional  wall motion abnormalities. There is mild left ventricular hypertrophy.  Left ventricular diastolic parameters  are consistent with Grade I diastolic dysfunction (impaired relaxation).  2. Right ventricular systolic function is normal. The right ventricular  size is normal. There is normal pulmonary artery systolic pressure.  3. Left atrial size was mildly dilated.  4. The mitral valve is abnormal. Trivial mitral valve regurgitation.  5. AV is thickened, calcified with minimally restricted motion. Peak and  mean gradients through the valve are 19 and 10 mm Hg respectively  consistent with very mild AS.Marland Kitchen The aortic valve is tricuspid. Aortic valve  regurgitation is not visualized.   Carotid Dopplers 08/20/2019: IMPRESSION: 1. Moderate amount of left-sided atherosclerotic plaque results  in elevated peak systolic velocities within the left internal carotid artery compatible with the 50-69% luminal narrowing range. Further evaluation with CTA could performed as clinically indicated. 2. Moderate amount of right-sided atherosclerotic plaque, not resulting in a hemodynamically significant stenosis.  Assessment and Plan:  1.  Essential hypertension.  No changes made today based on review of average blood pressure control and outside records.  She continues on combination of Norvasc, lisinopril, and HCTZ.  I encouraged her to follow-up with Dr. Pleas Koch as scheduled for repeat lab work to include assessment of renal function and potassium.  2.  Mixed hyperlipidemia, she continues on Lipitor with LDL 55.  3.  CAD status post DES x2 to the LAD/diagonal bifurcation in 2015.  No obvious angina reported.  She remains on aspirin and Lipitor.  4.  Biotronik pacemaker in place with follow-up by Dr. Lovena Le.  Medication Adjustments/Labs and Tests Ordered:  Current medicines are reviewed at length with the patient today.  Concerns regarding medicines are outlined above.   Tests Ordered: Orders Placed This Encounter  Procedures  . EKG 12-Lead    Medication Changes: No orders of the defined types were placed in this encounter.   Disposition:  Follow up 6 months in the [place] office.  Signed, Satira Sark, MD, [facility] 11/08/2019 2:08 PM    [facility] at [place], [place], [place] [address] Phone: [phone]; Fax: [phone]

## 2019-11-08 NOTE — Patient Instructions (Signed)

## 2019-11-17 DIAGNOSIS — L280 Lichen simplex chronicus: Secondary | ICD-10-CM | POA: Diagnosis not present

## 2019-11-26 ENCOUNTER — Other Ambulatory Visit: Payer: Self-pay | Admitting: Internal Medicine

## 2019-12-04 DIAGNOSIS — I5032 Chronic diastolic (congestive) heart failure: Secondary | ICD-10-CM | POA: Diagnosis not present

## 2019-12-04 DIAGNOSIS — I110 Hypertensive heart disease with heart failure: Secondary | ICD-10-CM | POA: Diagnosis not present

## 2019-12-04 DIAGNOSIS — E7849 Other hyperlipidemia: Secondary | ICD-10-CM | POA: Diagnosis not present

## 2019-12-10 DIAGNOSIS — E782 Mixed hyperlipidemia: Secondary | ICD-10-CM | POA: Diagnosis not present

## 2019-12-10 DIAGNOSIS — I10 Essential (primary) hypertension: Secondary | ICD-10-CM | POA: Diagnosis not present

## 2019-12-10 DIAGNOSIS — E039 Hypothyroidism, unspecified: Secondary | ICD-10-CM | POA: Diagnosis not present

## 2019-12-10 DIAGNOSIS — E559 Vitamin D deficiency, unspecified: Secondary | ICD-10-CM | POA: Diagnosis not present

## 2019-12-10 DIAGNOSIS — Z950 Presence of cardiac pacemaker: Secondary | ICD-10-CM | POA: Diagnosis not present

## 2019-12-10 DIAGNOSIS — E871 Hypo-osmolality and hyponatremia: Secondary | ICD-10-CM | POA: Diagnosis not present

## 2019-12-14 DIAGNOSIS — Z1331 Encounter for screening for depression: Secondary | ICD-10-CM | POA: Diagnosis not present

## 2019-12-14 DIAGNOSIS — E871 Hypo-osmolality and hyponatremia: Secondary | ICD-10-CM | POA: Diagnosis not present

## 2019-12-14 DIAGNOSIS — Z6830 Body mass index (BMI) 30.0-30.9, adult: Secondary | ICD-10-CM | POA: Diagnosis not present

## 2019-12-14 DIAGNOSIS — E782 Mixed hyperlipidemia: Secondary | ICD-10-CM | POA: Diagnosis not present

## 2019-12-14 DIAGNOSIS — I10 Essential (primary) hypertension: Secondary | ICD-10-CM | POA: Diagnosis not present

## 2019-12-14 DIAGNOSIS — I2510 Atherosclerotic heart disease of native coronary artery without angina pectoris: Secondary | ICD-10-CM | POA: Diagnosis not present

## 2019-12-14 DIAGNOSIS — Z1389 Encounter for screening for other disorder: Secondary | ICD-10-CM | POA: Diagnosis not present

## 2019-12-14 DIAGNOSIS — Z23 Encounter for immunization: Secondary | ICD-10-CM | POA: Diagnosis not present

## 2019-12-23 ENCOUNTER — Telehealth: Payer: Self-pay | Admitting: Family Medicine

## 2019-12-23 ENCOUNTER — Encounter: Payer: Self-pay | Admitting: *Deleted

## 2019-12-23 NOTE — Telephone Encounter (Signed)
Reports she recently had lab work ordered by her PCP on 12/10/2019 and was advised that her kidney functioning had increased and that her BP medication may have to be adjusted. Advised that recent lab work and office note from her PCP will be requested for review. Medications reviewed. Advised that she would be contacted once her records are received and reviewed by her provider. Verbalized understanding.

## 2019-12-23 NOTE — Telephone Encounter (Signed)
New message     Please call mobile anytime  or home # after 4pm    Patient would like to talk to [place] her PCP said her kidney function is down and has concerns about her bp medication.  Patient would like to discuss it with Jonni Sanger before anyone goes changing her medication again

## 2019-12-23 NOTE — Telephone Encounter (Signed)
Patient informed and verbalized understanding of plan. Says she will continue monitoring home BP and contact our office if she has high readings consecutively.

## 2019-12-23 NOTE — Telephone Encounter (Signed)
I reviewed the records from [place].  There are no recommendations in Dr. Lizbeth Bark note about medication changes.  I did review the lab work.  Her creatinine is actually normal at 1.0 but her GFR is just mildly reduced at 55 (greater than 59 normal), potassium is normal.  LDL well controlled at 63.  Hemoglobin also normal.  I see that her TSH is just mildly increased at 4.72.  None of this would suggest that we necessarily have to make major modifications in her antihypertensive medications, this would be more indicated depending on her current blood pressure control.  We could review some of her home blood pressure measurements with a nurse blood pressure check and go from there.

## 2019-12-24 ENCOUNTER — Other Ambulatory Visit: Payer: Self-pay | Admitting: *Deleted

## 2019-12-24 MED ORDER — AMLODIPINE BESYLATE 5 MG PO TABS: 2.5000 mg | ORAL_TABLET | Freq: Two times a day (BID) | ORAL | 3 refills | Status: DC

## 2020-01-04 DIAGNOSIS — E039 Hypothyroidism, unspecified: Secondary | ICD-10-CM | POA: Diagnosis not present

## 2020-01-04 DIAGNOSIS — I5032 Chronic diastolic (congestive) heart failure: Secondary | ICD-10-CM | POA: Diagnosis not present

## 2020-01-04 DIAGNOSIS — I110 Hypertensive heart disease with heart failure: Secondary | ICD-10-CM | POA: Diagnosis not present

## 2020-01-04 DIAGNOSIS — E7849 Other hyperlipidemia: Secondary | ICD-10-CM | POA: Diagnosis not present

## 2020-01-27 ENCOUNTER — Ambulatory Visit (INDEPENDENT_AMBULATORY_CARE_PROVIDER_SITE_OTHER): Payer: Medicare Other

## 2020-01-27 DIAGNOSIS — I495 Sick sinus syndrome: Secondary | ICD-10-CM | POA: Diagnosis not present

## 2020-01-27 LAB — CUP PACEART REMOTE DEVICE CHECK
Battery Remaining Percentage: 70 %
Brady Statistic RA Percent Paced: 86 %
Brady Statistic RV Percent Paced: 0 %
Date Time Interrogation Session: 20211222082905
Implantable Lead Implant Date: 20180626
Implantable Lead Implant Date: 20180626
Implantable Lead Location: 753859
Implantable Lead Location: 753860
Implantable Lead Model: 377
Implantable Lead Model: 377
Implantable Lead Serial Number: 49791993
Implantable Lead Serial Number: 49865965
Implantable Pulse Generator Implant Date: 20180626
Lead Channel Impedance Value: 468 Ohm
Lead Channel Impedance Value: 546 Ohm
Lead Channel Pacing Threshold Amplitude: 0.8 V
Lead Channel Pacing Threshold Amplitude: 1 V
Lead Channel Pacing Threshold Pulse Width: 0.4 ms
Lead Channel Pacing Threshold Pulse Width: 0.4 ms
Lead Channel Sensing Intrinsic Amplitude: 10.2 mV
Lead Channel Sensing Intrinsic Amplitude: 2.9 mV
Lead Channel Setting Pacing Amplitude: 2 V
Lead Channel Setting Pacing Amplitude: 2.4 V
Lead Channel Setting Pacing Pulse Width: 0.4 ms
Pulse Gen Model: 407145
Pulse Gen Serial Number: 69052081

## 2020-02-04 DIAGNOSIS — I5032 Chronic diastolic (congestive) heart failure: Secondary | ICD-10-CM | POA: Diagnosis not present

## 2020-02-04 DIAGNOSIS — I110 Hypertensive heart disease with heart failure: Secondary | ICD-10-CM | POA: Diagnosis not present

## 2020-02-04 DIAGNOSIS — E7849 Other hyperlipidemia: Secondary | ICD-10-CM | POA: Diagnosis not present

## 2020-02-04 DIAGNOSIS — E039 Hypothyroidism, unspecified: Secondary | ICD-10-CM | POA: Diagnosis not present

## 2020-02-09 NOTE — Progress Notes (Signed)
Remote pacemaker transmission.   

## 2020-02-25 DIAGNOSIS — Z950 Presence of cardiac pacemaker: Secondary | ICD-10-CM | POA: Diagnosis not present

## 2020-02-25 DIAGNOSIS — E039 Hypothyroidism, unspecified: Secondary | ICD-10-CM | POA: Diagnosis not present

## 2020-02-25 DIAGNOSIS — E871 Hypo-osmolality and hyponatremia: Secondary | ICD-10-CM | POA: Diagnosis not present

## 2020-02-25 DIAGNOSIS — I10 Essential (primary) hypertension: Secondary | ICD-10-CM | POA: Diagnosis not present

## 2020-02-28 DIAGNOSIS — L309 Dermatitis, unspecified: Secondary | ICD-10-CM | POA: Diagnosis not present

## 2020-03-01 DIAGNOSIS — E7849 Other hyperlipidemia: Secondary | ICD-10-CM | POA: Diagnosis not present

## 2020-03-01 DIAGNOSIS — E039 Hypothyroidism, unspecified: Secondary | ICD-10-CM | POA: Diagnosis not present

## 2020-03-01 DIAGNOSIS — E871 Hypo-osmolality and hyponatremia: Secondary | ICD-10-CM | POA: Diagnosis not present

## 2020-03-01 DIAGNOSIS — Z6830 Body mass index (BMI) 30.0-30.9, adult: Secondary | ICD-10-CM | POA: Diagnosis not present

## 2020-03-01 DIAGNOSIS — Z0001 Encounter for general adult medical examination with abnormal findings: Secondary | ICD-10-CM | POA: Diagnosis not present

## 2020-03-01 DIAGNOSIS — F411 Generalized anxiety disorder: Secondary | ICD-10-CM | POA: Diagnosis not present

## 2020-03-01 DIAGNOSIS — I2510 Atherosclerotic heart disease of native coronary artery without angina pectoris: Secondary | ICD-10-CM | POA: Diagnosis not present

## 2020-03-01 DIAGNOSIS — I10 Essential (primary) hypertension: Secondary | ICD-10-CM | POA: Diagnosis not present

## 2020-03-04 DIAGNOSIS — E039 Hypothyroidism, unspecified: Secondary | ICD-10-CM | POA: Diagnosis not present

## 2020-03-04 DIAGNOSIS — I110 Hypertensive heart disease with heart failure: Secondary | ICD-10-CM | POA: Diagnosis not present

## 2020-03-04 DIAGNOSIS — E7849 Other hyperlipidemia: Secondary | ICD-10-CM | POA: Diagnosis not present

## 2020-03-04 DIAGNOSIS — I5032 Chronic diastolic (congestive) heart failure: Secondary | ICD-10-CM | POA: Diagnosis not present

## 2020-04-03 DIAGNOSIS — E7849 Other hyperlipidemia: Secondary | ICD-10-CM | POA: Diagnosis not present

## 2020-04-03 DIAGNOSIS — I110 Hypertensive heart disease with heart failure: Secondary | ICD-10-CM | POA: Diagnosis not present

## 2020-04-03 DIAGNOSIS — E039 Hypothyroidism, unspecified: Secondary | ICD-10-CM | POA: Diagnosis not present

## 2020-04-03 DIAGNOSIS — I5032 Chronic diastolic (congestive) heart failure: Secondary | ICD-10-CM | POA: Diagnosis not present

## 2020-04-10 DIAGNOSIS — Z1231 Encounter for screening mammogram for malignant neoplasm of breast: Secondary | ICD-10-CM | POA: Diagnosis not present

## 2020-04-27 ENCOUNTER — Ambulatory Visit (INDEPENDENT_AMBULATORY_CARE_PROVIDER_SITE_OTHER): Payer: Medicare Other

## 2020-04-27 DIAGNOSIS — I495 Sick sinus syndrome: Secondary | ICD-10-CM | POA: Diagnosis not present

## 2020-04-28 LAB — CUP PACEART REMOTE DEVICE CHECK
Date Time Interrogation Session: 20220324182451
Implantable Lead Implant Date: 20180626
Implantable Lead Implant Date: 20180626
Implantable Lead Location: 753859
Implantable Lead Location: 753860
Implantable Lead Model: 377
Implantable Lead Model: 377
Implantable Lead Serial Number: 49791993
Implantable Lead Serial Number: 49865965
Implantable Pulse Generator Implant Date: 20180626
Pulse Gen Model: 407145
Pulse Gen Serial Number: 69052081

## 2020-05-03 DIAGNOSIS — E7849 Other hyperlipidemia: Secondary | ICD-10-CM | POA: Diagnosis not present

## 2020-05-03 DIAGNOSIS — I5032 Chronic diastolic (congestive) heart failure: Secondary | ICD-10-CM | POA: Diagnosis not present

## 2020-05-03 DIAGNOSIS — E039 Hypothyroidism, unspecified: Secondary | ICD-10-CM | POA: Diagnosis not present

## 2020-05-03 DIAGNOSIS — I110 Hypertensive heart disease with heart failure: Secondary | ICD-10-CM | POA: Diagnosis not present

## 2020-05-09 NOTE — Progress Notes (Signed)
Remote pacemaker transmission.   

## 2020-05-23 DIAGNOSIS — L439 Lichen planus, unspecified: Secondary | ICD-10-CM | POA: Diagnosis not present

## 2020-05-25 ENCOUNTER — Other Ambulatory Visit: Payer: Self-pay

## 2020-05-25 ENCOUNTER — Ambulatory Visit (INDEPENDENT_AMBULATORY_CARE_PROVIDER_SITE_OTHER): Payer: Medicare Other | Admitting: Cardiology

## 2020-05-25 ENCOUNTER — Telehealth: Payer: Self-pay | Admitting: Cardiology

## 2020-05-25 ENCOUNTER — Encounter: Payer: Self-pay | Admitting: Cardiology

## 2020-05-25 ENCOUNTER — Encounter: Payer: Self-pay | Admitting: *Deleted

## 2020-05-25 VITALS — BP 142/62 | HR 70 | Ht 59.0 in | Wt 160.0 lb

## 2020-05-25 DIAGNOSIS — I495 Sick sinus syndrome: Secondary | ICD-10-CM

## 2020-05-25 DIAGNOSIS — I25119 Atherosclerotic heart disease of native coronary artery with unspecified angina pectoris: Secondary | ICD-10-CM

## 2020-05-25 DIAGNOSIS — E782 Mixed hyperlipidemia: Secondary | ICD-10-CM | POA: Diagnosis not present

## 2020-05-25 DIAGNOSIS — R0602 Shortness of breath: Secondary | ICD-10-CM | POA: Diagnosis not present

## 2020-05-25 MED ORDER — CLOPIDOGREL BISULFATE 75 MG PO TABS: 75.0000 mg | ORAL_TABLET | Freq: Every day | ORAL | 3 refills | Status: DC

## 2020-05-25 NOTE — Patient Instructions (Addendum)
Medication Instructions:   Your physician has recommended you make the following change in your medication:   Stop effient  Start clopidogrel 75 mg by mouth daily  Continue other medications the same  Labwork:  none  Testing/Procedures: Your physician has requested that you have a lexiscan myoview. For further information please visit [email or web address]. Please follow instruction sheet, as given.  Follow-Up:  Your physician recommends that you schedule a follow-up appointment in: 6 months.  Any Other Special Instructions Will Be Listed Below (If Applicable).  If you need a refill on your cardiac medications before your next appointment, please call your pharmacy.

## 2020-05-25 NOTE — Telephone Encounter (Signed)
Pre-cert Verification for the following procedure    LEXISCAN MYOVIEW   DATE:   05/29/2020  LOCATION:[facility]

## 2020-05-25 NOTE — Progress Notes (Signed)
Cardiology Office Note  Date: 05/25/2020   ID: Amy [name], Amy [name] 02-13-1945, MRN [id number]  PCP:  [name], MD  Cardiologist:  [name], MD Electrophysiologist:  Cristopher Peru, MD   Chief Complaint  Patient presents with  . Cardiac follow-up    History of Present Illness: Amy [name] is a 75 y.o. female last seen in October 2021.  She is here today with her daughter for a follow-up visit.  She does not report any frank angina symptoms, does have some fatigue and shortness of breath with activity at higher levels.  She follows with Dr. Lovena Le, [place] pacemaker in place.  Device interrogation in March revealed normal function, 4-second episode of probable SVT.  I reviewed her medications.  Today we discussed taking her off Effient with switch to Plavix.  She has been on long-term dual antiplatelet therapy given previous LAD/diagonal bifurcation DES intervention.  She reports compliance with the remainder of her medications and follow-up with Dr. Pleas Koch.  Last LDL was 63.  Past Medical History:  Diagnosis Date  . Asthma   . CAD (coronary artery disease)    60-70% proximal LAD/diagonal bifurcational disease October 2015 at [facility] status DES x 2 to LAD/diagonal October 2015  . Essential hypertension   . Hyperlipidemia   . Hypothyroidism   . Sinus node dysfunction ([place])    Biotronik pacemaker in place    Past Surgical History:  Procedure Laterality Date  . ABDOMINAL HYSTERECTOMY    . ANAL FISSURE REPAIR    . CATARACT EXTRACTION W/PHACO Right 10/17/2014   Procedure: CATARACT EXTRACTION PHACO AND INTRAOCULAR LENS PLACEMENT ([place]);  Surgeon: Tonny Branch, MD;  Location: [facility] OR[facility];  Service: Ophthalmology;  Laterality: Right;  CDE: 4.88  . CATARACT EXTRACTION W/PHACO Left 10/27/2014   Procedure: CATARACT EXTRACTION PHACO AND INTRAOCULAR LENS PLACEMENT (I[facility]);  Surgeon: Tonny Branch, MD;  Location: [facility] ORS;  Service: Ophthalmology;  Laterality: Left;  CDE:7.86  .  COLONOSCOPY N/A 04/16/2018   Procedure: COLONOSCOPY;  Surgeon: Rogene Houston, MD;  Location: [facility] ENDO SUITE;  Service: Endoscopy;  Laterality: N/A;  830  . FINGER SURGERY    . NASAL SINUS SURGERY    . PACEMAKER IMPLANT N/A 07/30/2016   Procedure: Pacemaker Implant Dual Chamber;  Surgeon: Evans Lance, MD;  Location: [place] CV LAB;  Service: Cardiovascular;  Laterality: N/A;  . TONSILLECTOMY      Current Outpatient Medications  Medication Sig Dispense Refill  . acetaminophen (TYLENOL) 650 MG CR tablet Take 650 mg by mouth daily as needed for pain.    Marland Kitchen amLODipine (NORVASC) 5 MG tablet Take 0.5 tablets (2.5 mg total) by mouth 2 (two) times daily. 90 tablet 3  . aspirin 81 MG tablet Take 81 mg by mouth daily.    Marland Kitchen atorvastatin (LIPITOR) 20 MG tablet Take 20 mg by mouth at bedtime.   3  . Biotin 5 MG CAPS Take 5 mg by mouth daily at 12 noon.     . Cholecalciferol (VITAMIN D3) 2000 units capsule Take 2,000 Units by mouth daily at 12 noon.     . clobetasol (TEMOVATE) 0.05 % external solution Apply 1 application topically daily as needed (psoriasis).     . clopidogrel (PLAVIX) 75 MG tablet Take 1 tablet (75 mg total) by mouth daily. 90 tablet 3  . fexofenadine (ALLEGRA) 180 MG tablet Take 180 mg by mouth at bedtime.    . hydrochlorothiazide (MICROZIDE) 12.5 MG capsule Take 12.5 mg by mouth  daily.    . levothyroxine (SYNTHROID, LEVOTHROID) 50 MCG tablet Take 50 mcg by mouth daily before breakfast.    . lisinopril (ZESTRIL) 5 MG tablet Take 5 mg by mouth 2 (two) times daily.    . Multiple Vitamin (MULTIVITAMIN WITH MINERALS) TABS tablet Take 1 tablet by mouth daily at 12 noon.     . triamcinolone cream (KENALOG) 0.1 % Apply 1 application topically daily as needed (psoriasis). Mixed with nystatin cream 1:1    . vitamin C (ASCORBIC ACID) 500 MG tablet Take 500 mg by mouth daily at 12 noon.     No current facility-administered medications for this visit.   Allergies:  Sulfa antibiotics    ROS: No palpitations or syncope.  Physical Exam: VS:  BP (!) 142/62   Pulse 70   Ht 4\' 11"  (1.499 m)   Wt 160 lb (72.6 kg)   SpO2 98%   BMI 32.32 kg/m , BMI Body mass index is 32.32 kg/m.  Wt Readings from Last 3 Encounters:  05/25/20 160 lb (72.6 kg)  11/08/19 156 lb (70.8 kg)  09/03/19 155 lb 6.4 oz (70.5 kg)    General: Patient appears comfortable at rest. HEENT: Conjunctiva and lids normal, wearing a mask. Neck: Supple, no elevated JVP or carotid bruits, no thyromegaly. Lungs: Clear to auscultation, nonlabored breathing at rest. Cardiac: Regular rate and rhythm, no S3, 2/6 systolic murmur, no pericardial rub. Extremities: No pitting edema.  ECG:  An ECG dated 11/08/2019 was personally reviewed today and demonstrated:  Atrial paced rhythm.  Recent Labwork:  November 2021: BUN 16, creatinine 1.0, potassium 4.7, AST 19, ALT 17, cholesterol 159, triglycerides 40, HDL 87, LDL 63, TSH 4.72, hemoglobin 11.9, platelets 190  Other Studies Reviewed Today:  Echocardiogram 08/20/2019: 1. Left ventricular ejection fraction, by estimation, is >75%. The left  ventricle has hyperdynamic function. The left ventricle has no regional  wall motion abnormalities. There is mild left ventricular hypertrophy.  Left ventricular diastolic parameters  are consistent with Grade I diastolic dysfunction (impaired relaxation).  2. Right ventricular systolic function is normal. The right ventricular  size is normal. There is normal pulmonary artery systolic pressure.  3. Left atrial size was mildly dilated.  4. The mitral valve is abnormal. Trivial mitral valve regurgitation.  5. AV is thickened, calcified with minimally restricted motion. Peak and  mean gradients through the valve are 19 and 10 mm Hg respectively  consistent with very mild AS.Marland Kitchen The aortic valve is tricuspid. Aortic valve  regurgitation is not visualized.   Carotid Dopplers 08/20/2019: IMPRESSION: 1. Moderate amount of  left-sided atherosclerotic plaque results in elevated peak systolic velocities within the left internal carotid artery compatible with the 50-69% luminal narrowing range. Further evaluation with CTA could performed as clinically indicated. 2. Moderate amount of right-sided atherosclerotic plaque, not resulting in a hemodynamically significant stenosis.  Assessment and Plan:  1.  CAD status post DES x2 to the LAD/diagonal bifurcation in 2015.  She has been on long-term dual antiplatelet therapy, we will switch her from Effient to Plavix however at this point to reduce bleeding risk.  Continue Norvasc, Lipitor, and Zestril.  In light of fatigue and dyspnea on exertion we will also obtain a Lexiscan Myoview for reassessment of ischemic burden.  2.  Mixed hyperlipidemia, on Lipitor.  Last LDL 63.  3.  Sinus node dysfunction, Biotronik pacemaker in place with follow-up by Dr. Lovena Le.  Medication Adjustments/Labs and Tests Ordered: Current medicines are reviewed at length with the patient  today.  Concerns regarding medicines are outlined above.   Tests Ordered: Orders Placed This Encounter  Procedures  . NM Myocar Multi W/Spect W/Wall Motion / EF    Medication Changes: Meds ordered this encounter  Medications  . clopidogrel (PLAVIX) 75 MG tablet    Sig: Take 1 tablet (75 mg total) by mouth daily.    Dispense:  90 tablet    Refill:  3    05/25/2020 stop effient    Disposition:  Follow up 6 months.  Signed, Satira Sark, MD, [facility] 05/25/2020 4:03 PM    [facility] [address], [place], [place] [address] Phone: [phone]; Fax: [phone]

## 2020-05-26 ENCOUNTER — Telehealth: Payer: Self-pay | Admitting: Cardiology

## 2020-05-26 DIAGNOSIS — R5383 Other fatigue: Secondary | ICD-10-CM | POA: Diagnosis not present

## 2020-05-26 DIAGNOSIS — E782 Mixed hyperlipidemia: Secondary | ICD-10-CM | POA: Diagnosis not present

## 2020-05-26 DIAGNOSIS — K219 Gastro-esophageal reflux disease without esophagitis: Secondary | ICD-10-CM | POA: Diagnosis not present

## 2020-05-26 DIAGNOSIS — I10 Essential (primary) hypertension: Secondary | ICD-10-CM | POA: Diagnosis not present

## 2020-05-26 DIAGNOSIS — E7849 Other hyperlipidemia: Secondary | ICD-10-CM | POA: Diagnosis not present

## 2020-05-26 DIAGNOSIS — E039 Hypothyroidism, unspecified: Secondary | ICD-10-CM | POA: Diagnosis not present

## 2020-05-26 NOTE — Telephone Encounter (Signed)
Pharm notified she can begin now.  New prescription was sent on 05/25/20.  Pharm states she will call patient & let her know she can begin 24 hours after her last dose of the Effient.

## 2020-05-26 NOTE — Telephone Encounter (Signed)
Please give [facility] a call -- they're needing to know when the pt needs to stop Effient and when to start the Plavix   [phone]

## 2020-05-29 ENCOUNTER — Ambulatory Visit (HOSPITAL_COMMUNITY)
Admission: RE | Admit: 2020-05-29 | Discharge: 2020-05-29 | Disposition: A | Discharge status: Home / Self Care | Payer: Medicare Other | Source: Ambulatory Visit | Attending: Cardiology | Admitting: Cardiology

## 2020-05-29 ENCOUNTER — Encounter (HOSPITAL_COMMUNITY)
Admission: RE | Admit: 2020-05-29 | Discharge: 2020-05-29 | Disposition: A | Discharge status: Home / Self Care | Payer: Medicare Other | Source: Ambulatory Visit | Attending: Cardiology | Admitting: Cardiology

## 2020-05-29 DIAGNOSIS — I25119 Atherosclerotic heart disease of native coronary artery with unspecified angina pectoris: Secondary | ICD-10-CM | POA: Diagnosis not present

## 2020-05-29 DIAGNOSIS — R0602 Shortness of breath: Secondary | ICD-10-CM | POA: Insufficient documentation

## 2020-05-29 LAB — NM MYOCAR MULTI W/SPECT W/WALL MOTION / EF
LV dias vol: 52 mL (ref 46–106)
LV sys vol: 19 mL
Peak HR: 89 {beats}/min
RATE: 0.43
Rest HR: 64 {beats}/min
SDS: 2
SRS: 0
SSS: 2
TID: 1.37

## 2020-05-29 MED ORDER — TECHNETIUM TC 99M TETROFOSMIN IV KIT
30.0000 | PACK | Freq: Once | INTRAVENOUS | Status: AC | PRN
  Administered 2020-05-29: 32.2 via INTRAVENOUS

## 2020-05-29 MED ORDER — SODIUM CHLORIDE FLUSH 0.9 % IV SOLN
INTRAVENOUS | Status: AC
  Administered 2020-05-29: 10 mL via INTRAVENOUS
  Filled 2020-05-29: qty 10

## 2020-05-29 MED ORDER — REGADENOSON 0.4 MG/5ML IV SOLN
INTRAVENOUS | Status: AC
  Administered 2020-05-29: 0.4 mg via INTRAVENOUS
  Filled 2020-05-29: qty 5

## 2020-05-29 MED ORDER — TECHNETIUM TC 99M TETROFOSMIN IV KIT
10.0000 | PACK | Freq: Once | INTRAVENOUS | Status: AC | PRN
  Administered 2020-05-29: 10.78 via INTRAVENOUS

## 2020-05-30 ENCOUNTER — Telehealth: Payer: Self-pay | Admitting: *Deleted

## 2020-05-30 DIAGNOSIS — I10 Essential (primary) hypertension: Secondary | ICD-10-CM | POA: Diagnosis not present

## 2020-05-30 DIAGNOSIS — I2510 Atherosclerotic heart disease of native coronary artery without angina pectoris: Secondary | ICD-10-CM | POA: Diagnosis not present

## 2020-05-30 DIAGNOSIS — E7849 Other hyperlipidemia: Secondary | ICD-10-CM | POA: Diagnosis not present

## 2020-05-30 DIAGNOSIS — E039 Hypothyroidism, unspecified: Secondary | ICD-10-CM | POA: Diagnosis not present

## 2020-05-30 DIAGNOSIS — Z6830 Body mass index (BMI) 30.0-30.9, adult: Secondary | ICD-10-CM | POA: Diagnosis not present

## 2020-05-30 DIAGNOSIS — E871 Hypo-osmolality and hyponatremia: Secondary | ICD-10-CM | POA: Diagnosis not present

## 2020-05-30 DIAGNOSIS — F411 Generalized anxiety disorder: Secondary | ICD-10-CM | POA: Diagnosis not present

## 2020-05-30 DIAGNOSIS — J309 Allergic rhinitis, unspecified: Secondary | ICD-10-CM | POA: Diagnosis not present

## 2020-05-30 NOTE — Telephone Encounter (Signed)
-----   Message from Satira Sark, MD sent at 05/29/2020  3:51 PM EDT ----- Results reviewed.  Please let her know that the stress test was overall low risk.  No ischemic territories to suggest progressive obstructive CAD.  LVEF also normal.  Continue with medical therapy and follow-up.

## 2020-05-30 NOTE — Telephone Encounter (Signed)
Patient informed. Copy sent to PCP °

## 2020-06-03 DIAGNOSIS — I110 Hypertensive heart disease with heart failure: Secondary | ICD-10-CM | POA: Diagnosis not present

## 2020-06-03 DIAGNOSIS — E039 Hypothyroidism, unspecified: Secondary | ICD-10-CM | POA: Diagnosis not present

## 2020-06-03 DIAGNOSIS — E7849 Other hyperlipidemia: Secondary | ICD-10-CM | POA: Diagnosis not present

## 2020-06-03 DIAGNOSIS — I5032 Chronic diastolic (congestive) heart failure: Secondary | ICD-10-CM | POA: Diagnosis not present

## 2020-06-12 ENCOUNTER — Telehealth: Payer: Self-pay | Admitting: Cardiology

## 2020-06-12 NOTE — Telephone Encounter (Signed)
Pt had a spell where she got nauseous and then started sweating to where it was dripping off of her. Pt states she was weak and shakey- it scared her and made her a little upset. She was sitting when it started and didn't try to move.   She's not sure what happened and hasn't had another spell since then.   No chest pain, shortness of breath  Please call [phone] or cell [phone]

## 2020-06-12 NOTE — Telephone Encounter (Signed)
Reports sweating,shaking and weak spell last week that started with a little nausea. Said she called her PCP and was advised to call cardiology. Daughter advised patient to eat some pudding and patient says her symptoms resolved. BP and HR was checked during time of spell and BP was 120/58 & HR 62. Denies dizziness, chest pain or sob. Says PPM transmission have been sent several times since symptoms occurred. Advised to continue to monitor symptoms and contact PCP for an evaluation. Verbalized understanding of plan.

## 2020-06-15 ENCOUNTER — Other Ambulatory Visit: Payer: Self-pay

## 2020-06-15 ENCOUNTER — Encounter (HOSPITAL_COMMUNITY): Payer: Self-pay | Admitting: *Deleted

## 2020-06-15 ENCOUNTER — Emergency Department (HOSPITAL_COMMUNITY)
Admission: EM | Admit: 2020-06-15 | Discharge: 2020-06-15 | Disposition: A | Discharge status: Home / Self Care | Payer: Medicare Other | Attending: Emergency Medicine | Admitting: Emergency Medicine

## 2020-06-15 DIAGNOSIS — Z7982 Long term (current) use of aspirin: Secondary | ICD-10-CM | POA: Diagnosis not present

## 2020-06-15 DIAGNOSIS — E039 Hypothyroidism, unspecified: Secondary | ICD-10-CM | POA: Diagnosis not present

## 2020-06-15 DIAGNOSIS — J45909 Unspecified asthma, uncomplicated: Secondary | ICD-10-CM | POA: Diagnosis not present

## 2020-06-15 DIAGNOSIS — E871 Hypo-osmolality and hyponatremia: Secondary | ICD-10-CM | POA: Diagnosis not present

## 2020-06-15 DIAGNOSIS — Z7902 Long term (current) use of antithrombotics/antiplatelets: Secondary | ICD-10-CM | POA: Diagnosis not present

## 2020-06-15 DIAGNOSIS — Z950 Presence of cardiac pacemaker: Secondary | ICD-10-CM | POA: Diagnosis not present

## 2020-06-15 DIAGNOSIS — Z79899 Other long term (current) drug therapy: Secondary | ICD-10-CM | POA: Insufficient documentation

## 2020-06-15 DIAGNOSIS — Z951 Presence of aortocoronary bypass graft: Secondary | ICD-10-CM | POA: Diagnosis not present

## 2020-06-15 DIAGNOSIS — I2510 Atherosclerotic heart disease of native coronary artery without angina pectoris: Secondary | ICD-10-CM | POA: Diagnosis not present

## 2020-06-15 DIAGNOSIS — I10 Essential (primary) hypertension: Secondary | ICD-10-CM | POA: Insufficient documentation

## 2020-06-15 DIAGNOSIS — R112 Nausea with vomiting, unspecified: Secondary | ICD-10-CM | POA: Diagnosis not present

## 2020-06-15 LAB — COMPREHENSIVE METABOLIC PANEL
ALT: 29 U/L (ref 0–44)
AST: 29 U/L (ref 15–41)
Albumin: 4.4 g/dL (ref 3.5–5.0)
Alkaline Phosphatase: 109 U/L (ref 38–126)
Anion gap: 10 (ref 5–15)
BUN: 29 mg/dL — ABNORMAL HIGH (ref 8–23)
CO2: 25 mmol/L (ref 22–32)
Calcium: 9 mg/dL (ref 8.9–10.3)
Chloride: 92 mmol/L — ABNORMAL LOW (ref 98–111)
Creatinine, Ser: 1.04 mg/dL — ABNORMAL HIGH (ref 0.44–1.00)
GFR, Estimated: 56 mL/min — ABNORMAL LOW (ref >60)
Glucose, Bld: 151 mg/dL — ABNORMAL HIGH (ref 70–99)
Potassium: 3.8 mmol/L (ref 3.5–5.1)
Sodium: 127 mmol/L — ABNORMAL LOW (ref 135–145)
Total Bilirubin: 1.8 mg/dL — ABNORMAL HIGH (ref 0.3–1.2)
Total Protein: 7.8 g/dL (ref 6.5–8.1)

## 2020-06-15 LAB — CBC
HCT: 41.9 % (ref 36.0–46.0)
Hemoglobin: 14.2 g/dL (ref 12.0–15.0)
MCH: 30 pg (ref 26.0–34.0)
MCHC: 33.9 g/dL (ref 30.0–36.0)
MCV: 88.6 fL (ref 80.0–100.0)
Platelets: 173 10*3/uL (ref 150–400)
RBC: 4.73 MIL/uL (ref 3.87–5.11)
RDW: 13.1 % (ref 11.5–15.5)
WBC: 15.4 10*3/uL — ABNORMAL HIGH (ref 4.0–10.5)
nRBC: 0 % (ref 0.0–0.2)

## 2020-06-15 MED ORDER — ONDANSETRON 4 MG PO TBDP: 4.0000 mg | ORAL_TABLET | Freq: Four times a day (QID) | ORAL | 0 refills | Status: DC | PRN

## 2020-06-15 MED ORDER — ONDANSETRON HCL 4 MG/2ML IJ SOLN
4.0000 mg | INTRAMUSCULAR | Status: AC
  Administered 2020-06-15: 4 mg via INTRAVENOUS
  Filled 2020-06-15: qty 2

## 2020-06-15 MED ORDER — SODIUM CHLORIDE 0.9 % IV BOLUS
500.0000 mL | Freq: Once | INTRAVENOUS | Status: AC
  Administered 2020-06-15: 500 mL via INTRAVENOUS

## 2020-06-15 NOTE — Discharge Instructions (Signed)
You may take Zofran every 6 hours as needed for nausea and vomiting  Your blood test showed that your blood counts were up, this occurs when you are sick and it should be rechecked within 1 week.  It also showed that your salt level was low, please eat extra salt for the next few days and have your family doctor recheck your salt level in 1 week.  Your sodium was 127.  You are to return to the emergency department immediately for any severe or worsening pain vomiting fevers or worsening symptoms

## 2020-06-15 NOTE — ED Triage Notes (Signed)
Pt c/o n/v/d that started today; pt denies any abdominal pain

## 2020-06-15 NOTE — ED Provider Notes (Signed)
[facility] EMERGENCY DEPARTMENT Provider Note   CSN: [id number] Arrival date & time: 06/15/20  2111     History Chief Complaint  Patient presents with  . Emesis    Amy [name] is a 75 y.o. female.  HPI   This patient is a 75 year old female, she has a known history of coronary disease, she has a history of a pacemaker due to sinus node dysfunction, she has hypertension hyperlipidemia and hypothyroidism.  The patient was recently evaluated with a stress test, this was performed on April 25, it showed no diagnostic ST segment changes to indicate ischemia, there was a small mild intensity apical anterior defect that is fixed and most consistent with soft tissue attenuation, no ischemic territories, ejection fraction 64%, considered a low risk study.  She presents today after stating that at 2:00 in the afternoon approximately 8 hours ago she started to feel generally bad but nonspecific.  Over the course of the next 2 hours it got worse and by 4:00 she was having vomiting and diarrhea.  Initially this came out as food but after that she started to vomit clear liquids and yellow gastric contents.  No blood in the stool, no blood in the vomit, no coughing shortness of breath or chest pain, no dysuria, no swelling of the legs, no rashes, no headache.  She feels a little bit dehydrated and still feels nauseated but has stopped vomiting for the time being.  She denies any sick contacts, her family member at the bedside states they have not been sick, there has been no suspicious food items or meals that were considered possible food poisoning  Past Medical History:  Diagnosis Date  . Asthma   . CAD (coronary artery disease)    60-70% proximal LAD/diagonal bifurcational disease October 2015 at [facility] status DES x 2 to LAD/diagonal October 2015  . Essential hypertension   . Hyperlipidemia   . Hypothyroidism   . Sinus node dysfunction ([place])    Biotronik pacemaker in place    Patient Active  Problem List   Diagnosis Date Noted  . Special screening for malignant neoplasms, colon 12/25/2017  . Sinus node dysfunction ([place]) 07/18/2016  . Palpitation 07/18/2016    Past Surgical History:  Procedure Laterality Date  . ABDOMINAL HYSTERECTOMY    . ANAL FISSURE REPAIR    . CATARACT EXTRACTION W/PHACO Right 10/17/2014   Procedure: CATARACT EXTRACTION PHACO AND INTRAOCULAR LENS PLACEMENT ([place]);  Surgeon: Tonny Branch, MD;  Location: [facility];  Service: Ophthalmology;  Laterality: Right;  CDE: 4.88  . CATARACT EXTRACTION W/PHACO Left 10/27/2014   Procedure: CATARACT EXTRACTION PHACO AND INTRAOCULAR LENS PLACEMENT ([facility]);  Surgeon: Tonny Branch, MD;  Location: [facility] ORS;  Service: Ophthalmology;  Laterality: Left;  CDE:7.86  . COLONOSCOPY N/A 04/16/2018   Procedure: COLONOSCOPY;  Surgeon: Rogene Houston, MD;  Location: [facility];  Service: Endoscopy;  Laterality: N/A;  830  . FINGER SURGERY    . NASAL SINUS SURGERY    . PACEMAKER IMPLANT N/A 07/30/2016   Procedure: Pacemaker Implant Dual Chamber;  Surgeon: Evans Lance, MD;  Location: [place] CV LAB;  Service: Cardiovascular;  Laterality: N/A;  . TONSILLECTOMY       OB History   No obstetric history on file.     Family History  Problem Relation Age of Onset  . Heart disease Father     Social History   Tobacco Use  . Smoking status: Never Smoker  . Smokeless tobacco: Never Used  Vaping Use  . Vaping Use: Never used  Substance Use Topics  . Alcohol use: No    Alcohol/week: 0.0 standard drinks  . Drug use: No    Home Medications Prior to Admission medications   Medication Sig Start Date End Date Taking? Authorizing Provider  ondansetron (ZOFRAN ODT) 4 MG disintegrating tablet Take 1 tablet (4 mg total) by mouth every 6 (six) hours as needed for nausea. 06/15/20  Yes Noemi Chapel, MD  ondansetron (ZOFRAN ODT) 4 MG disintegrating tablet Take 1 tablet (4 mg total) by mouth every 6 (six) hours as needed for nausea. 06/15/20   Yes Noemi Chapel, MD  acetaminophen (TYLENOL) 650 MG CR tablet Take 650 mg by mouth daily as needed for pain.    [provider]  amLODipine (NORVASC) 5 MG tablet Take 0.5 tablets (2.5 mg total) by mouth 2 (two) times daily. 12/24/19 03/23/20  Satira Sark, MD  aspirin 81 MG tablet Take 81 mg by mouth daily.    [provider]  atorvastatin (LIPITOR) 20 MG tablet Take 20 mg by mouth at bedtime.  06/27/17   [provider]  Biotin 5 MG CAPS Take 5 mg by mouth daily at 12 noon.     [provider]  Cholecalciferol (VITAMIN D3) 2000 units capsule Take 2,000 Units by mouth daily at 12 noon.     [provider]  clobetasol (TEMOVATE) 0.05 % external solution Apply 1 application topically daily as needed (psoriasis).     [provider]  clopidogrel (PLAVIX) 75 MG tablet Take 1 tablet (75 mg total) by mouth daily. 05/25/20   Satira Sark, MD  fexofenadine (ALLEGRA) 180 MG tablet Take 180 mg by mouth at bedtime.    [provider]  hydrochlorothiazide (MICROZIDE) 12.5 MG capsule Take 12.5 mg by mouth daily.    [provider]  levothyroxine (SYNTHROID, LEVOTHROID) 50 MCG tablet Take 50 mcg by mouth daily before breakfast.    [provider]  lisinopril (ZESTRIL) 5 MG tablet Take 5 mg by mouth 2 (two) times daily. 12/02/19   [provider]  Multiple Vitamin (MULTIVITAMIN WITH MINERALS) TABS tablet Take 1 tablet by mouth daily at 12 noon.     [provider]  triamcinolone cream (KENALOG) 0.1 % Apply 1 application topically daily as needed (psoriasis). Mixed with nystatin cream 1:1 11/17/17   [provider]  vitamin C (ASCORBIC ACID) 500 MG tablet Take 500 mg by mouth daily at 12 noon.    [provider]    Allergies    Sulfa antibiotics  Review of Systems   Review of Systems  All other systems reviewed and are negative.   Physical Exam Updated Vital Signs BP (!) 167/60    Pulse 85   Temp 97.6 F (36.4 C) (Oral)   Resp 18   Ht 1.499 m (4\' 11" )   Wt 72.6 kg   SpO2 99%   BMI 32.32 kg/m   Physical Exam Vitals and nursing note reviewed.  Constitutional:      General: She is not in acute distress.    Appearance: She is well-developed.  HENT:     Head: Normocephalic and atraumatic.     Mouth/Throat:     Mouth: Mucous membranes are dry.     Pharynx: No oropharyngeal exudate.  Eyes:     General: No scleral icterus.       Right eye: No discharge.        Left eye: No  discharge.     Conjunctiva/sclera: Conjunctivae normal.     Pupils: Pupils are equal, round, and reactive to light.  Neck:     Thyroid: No thyromegaly.     Vascular: No JVD.  Cardiovascular:     Rate and Rhythm: Normal rate and regular rhythm.     Heart sounds: Normal heart sounds. No murmur heard. No friction rub. No gallop.   Pulmonary:     Effort: Pulmonary effort is normal. No respiratory distress.     Breath sounds: Normal breath sounds. No wheezing or rales.  Abdominal:     General: Bowel sounds are normal. There is no distension.     Palpations: Abdomen is soft. There is no mass.     Tenderness: There is no abdominal tenderness.     Comments: Completely nontender abdomen  Musculoskeletal:        General: No tenderness. Normal range of motion.     Cervical back: Normal range of motion and neck supple.     Right lower leg: No edema.     Left lower leg: No edema.  Lymphadenopathy:     Cervical: No cervical adenopathy.  Skin:    General: Skin is warm and dry.     Findings: No erythema or rash.  Neurological:     Mental Status: She is alert.     Coordination: Coordination normal.  Psychiatric:        Behavior: Behavior normal.     ED Results / Procedures / Treatments   Labs (all labs ordered are listed, but only abnormal results are displayed) Labs Reviewed  CBC - Abnormal; Notable for the following components:      Result Value   WBC 15.4 (*)    All other  components within normal limits  COMPREHENSIVE METABOLIC PANEL - Abnormal; Notable for the following components:   Sodium 127 (*)    Chloride 92 (*)    Glucose, Bld 151 (*)    BUN 29 (*)    Creatinine, Ser 1.04 (*)    Total Bilirubin 1.8 (*)    GFR, Estimated 56 (*)    All other components within normal limits    EKG None  Radiology No results found.  Procedures Procedures   Medications Ordered in ED Medications  ondansetron (ZOFRAN) injection 4 mg (4 mg Intravenous Given 06/15/20 2220)  sodium chloride 0.9 % bolus 500 mL (0 mLs Intravenous Stopped 06/15/20 2324)    ED Course  I have reviewed the triage vital signs and the nursing notes.  Pertinent labs & imaging results that were available during my care of the patient were reviewed by me and considered in my medical decision making (see chart for details).    MDM Rules/Calculators/A&P                          Mucous membranes are slightly dehydrated, vital signs are unremarkable except for mild hypertension, abdomen is nontender but bowel sounds are increased suggesting a gastroenteritis.  She has no chest pain shortness of breath coughing or fever.  She has no specific exposures.  We will check electrolytes blood counts and give fluids and Zofran.  The patient is agreeable, she does not need any imaging procedures at this time.  The patient is feeling better after Zofran, I have reviewed her labs with her including her sodium of 127 and her blood counts of 15,400.  She has no abdominal tenderness, her vital signs are normal, she  is requesting discharge and feels that she can come back if things get worse.  I think this is reasonable.  She will be given 4 tablets of Zofran to go home with and a prescription for as needed.  Final Clinical Impression(s) / ED Diagnoses Final diagnoses:  Non-intractable vomiting with nausea, unspecified vomiting type  Hyponatremia    Rx / DC Orders ED Discharge Orders         Ordered     ondansetron (ZOFRAN ODT) 4 MG disintegrating tablet  Every 6 hours PRN        06/15/20 2326    ondansetron (ZOFRAN ODT) 4 MG disintegrating tablet  Every 6 hours PRN        06/15/20 2326           Noemi Chapel, MD 06/15/20 2327

## 2020-06-22 DIAGNOSIS — E876 Hypokalemia: Secondary | ICD-10-CM | POA: Diagnosis not present

## 2020-06-29 DIAGNOSIS — E7849 Other hyperlipidemia: Secondary | ICD-10-CM | POA: Diagnosis not present

## 2020-06-29 DIAGNOSIS — R5383 Other fatigue: Secondary | ICD-10-CM | POA: Diagnosis not present

## 2020-06-29 DIAGNOSIS — E876 Hypokalemia: Secondary | ICD-10-CM | POA: Diagnosis not present

## 2020-06-29 DIAGNOSIS — E782 Mixed hyperlipidemia: Secondary | ICD-10-CM | POA: Diagnosis not present

## 2020-07-03 DIAGNOSIS — E7849 Other hyperlipidemia: Secondary | ICD-10-CM | POA: Diagnosis not present

## 2020-07-03 DIAGNOSIS — I110 Hypertensive heart disease with heart failure: Secondary | ICD-10-CM | POA: Diagnosis not present

## 2020-07-03 DIAGNOSIS — E039 Hypothyroidism, unspecified: Secondary | ICD-10-CM | POA: Diagnosis not present

## 2020-07-03 DIAGNOSIS — I5032 Chronic diastolic (congestive) heart failure: Secondary | ICD-10-CM | POA: Diagnosis not present

## 2020-07-04 DIAGNOSIS — Z780 Asymptomatic menopausal state: Secondary | ICD-10-CM | POA: Diagnosis not present

## 2020-07-04 DIAGNOSIS — M810 Age-related osteoporosis without current pathological fracture: Secondary | ICD-10-CM | POA: Diagnosis not present

## 2020-07-04 LAB — HM DEXA SCAN

## 2020-07-11 DIAGNOSIS — K219 Gastro-esophageal reflux disease without esophagitis: Secondary | ICD-10-CM | POA: Diagnosis not present

## 2020-07-11 DIAGNOSIS — E871 Hypo-osmolality and hyponatremia: Secondary | ICD-10-CM | POA: Diagnosis not present

## 2020-07-25 ENCOUNTER — Ambulatory Visit (INDEPENDENT_AMBULATORY_CARE_PROVIDER_SITE_OTHER): Payer: Medicare Other | Admitting: Internal Medicine

## 2020-07-25 ENCOUNTER — Encounter: Payer: Self-pay | Admitting: Internal Medicine

## 2020-07-25 ENCOUNTER — Other Ambulatory Visit: Payer: Self-pay

## 2020-07-25 VITALS — BP 134/60 | HR 77 | Ht 59.0 in | Wt 152.4 lb

## 2020-07-25 DIAGNOSIS — I25119 Atherosclerotic heart disease of native coronary artery with unspecified angina pectoris: Secondary | ICD-10-CM

## 2020-07-25 DIAGNOSIS — I495 Sick sinus syndrome: Secondary | ICD-10-CM | POA: Diagnosis not present

## 2020-07-25 NOTE — Patient Instructions (Signed)
Medication Instructions:  Your physician recommends that you continue on your current medications as directed. Please refer to the Current Medication list given to you today.  *If you need a refill on your cardiac medications before your next appointment, please call your pharmacy*   Lab Work: NONE   If you have labs (blood work) drawn today and your tests are completely normal, you will receive your results only by: . MyChart Message (if you have MyChart) OR . A paper copy in the mail If you have any lab test that is abnormal or we need to change your treatment, we will call you to review the results.   Testing/Procedures: NONE    Follow-Up: At [facility], you and your health needs are our priority.  As part of our continuing mission to provide you with exceptional heart care, we have created designated Provider Care Teams.  These Care Teams include your primary Cardiologist (physician) and Advanced Practice Providers (APPs -  Physician Assistants and Nurse Practitioners) who all work together to provide you with the care you need, when you need it.  We recommend signing up for the patient portal called "MyChart".  Sign up information is provided on this After Visit Summary.  MyChart is used to connect with patients for Virtual Visits (Telemedicine).  Patients are able to view lab/test results, encounter notes, upcoming appointments, etc.  Non-urgent messages can be sent to your provider as well.   To learn more about what you can do with MyChart, go to [email or web address].    Your next appointment:   1 year(s)  The format for your next appointment:   In Person  Provider:   Gregg Taylor, MD   Other Instructions Thank you for choosing [facility]!    

## 2020-07-25 NOTE — Progress Notes (Signed)
HPI Amy [name] returns today for followup. She is a pleasant 75 yo woman with a h/o sinus node dysfunction, pAF, s/p PPM insertion. She notes that her bp has improved. She denies chest pain. She is s/p PCI/Stent over 5 years ago. She has minimal leg swelling. She has not lost weight despite exercise. She feels well however.  Allergies  Allergen Reactions   Sulfa Antibiotics Rash     Current Outpatient Medications  Medication Sig Dispense Refill   acetaminophen (TYLENOL) 650 MG CR tablet Take 650 mg by mouth daily as needed for pain.     aspirin 81 MG tablet Take 81 mg by mouth daily.     atorvastatin (LIPITOR) 20 MG tablet Take 20 mg by mouth at bedtime.   3   Biotin 5 MG CAPS Take 5 mg by mouth daily at 12 noon.      Cholecalciferol (VITAMIN D3) 2000 units capsule Take 2,000 Units by mouth daily at 12 noon.      clobetasol (TEMOVATE) 0.05 % external solution Apply 1 application topically daily as needed (psoriasis).      clopidogrel (PLAVIX) 75 MG tablet Take 1 tablet (75 mg total) by mouth daily. 90 tablet 3   fexofenadine (ALLEGRA) 180 MG tablet Take 180 mg by mouth at bedtime.     hydrochlorothiazide (MICROZIDE) 12.5 MG capsule Take 12.5 mg by mouth daily.     levothyroxine (SYNTHROID, LEVOTHROID) 50 MCG tablet Take 50 mcg by mouth daily before breakfast.     lisinopril (ZESTRIL) 5 MG tablet Take 5 mg by mouth 2 (two) times daily.     Multiple Vitamin (MULTIVITAMIN WITH MINERALS) TABS tablet Take 1 tablet by mouth daily at 12 noon.      ondansetron (ZOFRAN ODT) 4 MG disintegrating tablet Take 1 tablet (4 mg total) by mouth every 6 (six) hours as needed for nausea. 4 tablet 0   ondansetron (ZOFRAN ODT) 4 MG disintegrating tablet Take 1 tablet (4 mg total) by mouth every 6 (six) hours as needed for nausea. 12 tablet 0   triamcinolone cream (KENALOG) 0.1 % Apply 1 application topically daily as needed (psoriasis). Mixed with nystatin cream 1:1     vitamin C (ASCORBIC ACID) 500 MG  tablet Take 500 mg by mouth daily at 12 noon.     No current facility-administered medications for this visit.     Past Medical History:  Diagnosis Date   Asthma    CAD (coronary artery disease)    60-70% proximal LAD/diagonal bifurcational disease October 2015 at [facility] status DES x 2 to LAD/diagonal October 2015   Essential hypertension    Hyperlipidemia    Hypothyroidism    Sinus node dysfunction ([place])    Biotronik pacemaker in place    ROS:   All systems reviewed and negative except as noted in the HPI.   Past Surgical History:  Procedure Laterality Date   ABDOMINAL HYSTERECTOMY     ANAL FISSURE REPAIR     CATARACT EXTRACTION W/PHACO Right 10/17/2014   Procedure: CATARACT EXTRACTION PHACO AND INTRAOCULAR LENS PLACEMENT ([place]);  Surgeon: Tonny Branch, MD;  Location: [facility];  Service: Ophthalmology;  Laterality: Right;  CDE: 4.88   CATARACT EXTRACTION W/PHACO Left 10/27/2014   Procedure: CATARACT EXTRACTION PHACO AND INTRAOCULAR LENS PLACEMENT (IOC);  Surgeon: Tonny Branch, MD;  Location: [facility];  Service: Ophthalmology;  Laterality: Left;  CDE:7.86   COLONOSCOPY N/A 04/16/2018   Procedure: COLONOSCOPY;  Surgeon: Rogene Houston, MD;  Location: [facility];  Service: Endoscopy;  Laterality: N/A;  [place]     NASAL SINUS SURGERY     PACEMAKER IMPLANT N/A 07/30/2016   Procedure: Pacemaker Implant Dual Chamber;  Surgeon: Evans Lance, MD;  Location: [place] CV LAB;  Service: Cardiovascular;  Laterality: N/A;   TONSILLECTOMY       Family History  Problem Relation Age of Onset   Heart disease Father      Social History   Socioeconomic History   Marital status: Widowed    Spouse name: Not on file   Number of children: Not on file   Years of education: Not on file   Highest education level: Not on file  Occupational History   Not on file  Tobacco Use   Smoking status: Never   Smokeless tobacco: Never  Vaping Use   Vaping Use: Never used   Substance and Sexual Activity   Alcohol use: No    Alcohol/week: 0.0 standard drinks   Drug use: No   Sexual activity: Never  Other Topics Concern   Not on file  Social History Narrative   Not on file   Social Determinants of Health   Financial Resource Strain: Not on file  Food Insecurity: Not on file  Transportation Needs: Not on file  Physical Activity: Not on file  Stress: Not on file  Social Connections: Not on file  Intimate Partner Violence: Not on file     BP 134/60   Pulse 77   Ht 4\' 11"  (1.499 m)   Wt 152 lb 6.4 oz (69.1 kg)   SpO2 98%   BMI 30.78 kg/m   Physical Exam:  Well appearing NAD HEENT: Unremarkable Neck:  No JVD, no thyromegally Lymphatics:  No adenopathy Back:  No CVA tenderness Lungs:  Clear HEART:  Regular rate rhythm, no murmurs, no rubs, no clicks Abd:  soft, positive bowel sounds, no organomegally, no rebound, no guarding Ext:  2 plus pulses, no edema, no cyanosis, no clubbing Skin:  No rashes no nodules Neuro:  CN II through XII intact, motor grossly intact   DEVICE  Normal device function.  See PaceArt for details.   Assess/Plan:  1. Sinus node dysfunction - she is asymptomatic, s/p PPM insertion. 2. PPM - her biotronik DDD PPM is working normally. We will recheck in several months. 3. HTN - her bp is minimally elevated but better at home. 4. Dyslipidemia - she has tolerated her statin therapy with no difficulty.   Mikle Bosworth.D.

## 2020-07-26 LAB — CUP PACEART REMOTE DEVICE CHECK
Battery Remaining Percentage: 70 %
Brady Statistic RA Percent Paced: 70 %
Brady Statistic RV Percent Paced: 0 %
Date Time Interrogation Session: 20220622084606
Implantable Lead Implant Date: 20180626
Implantable Lead Implant Date: 20180626
Implantable Lead Location: 753859
Implantable Lead Location: 753860
Implantable Lead Model: 377
Implantable Lead Model: 377
Implantable Lead Serial Number: 49791993
Implantable Lead Serial Number: 49865965
Implantable Pulse Generator Implant Date: 20180626
Lead Channel Impedance Value: 449 Ohm
Lead Channel Impedance Value: 449 Ohm
Lead Channel Impedance Value: 468 Ohm
Lead Channel Pacing Threshold Amplitude: 0.6 V
Lead Channel Pacing Threshold Amplitude: 1.1 V
Lead Channel Pacing Threshold Pulse Width: 0.4 ms
Lead Channel Pacing Threshold Pulse Width: 0.4 ms
Lead Channel Sensing Intrinsic Amplitude: 3 mV
Lead Channel Sensing Intrinsic Amplitude: 9.5 mV
Lead Channel Setting Pacing Amplitude: 2 V
Lead Channel Setting Pacing Amplitude: 2.4 V
Lead Channel Setting Pacing Pulse Width: 0.4 ms
Pulse Gen Model: 407145
Pulse Gen Serial Number: 69052081

## 2020-07-27 ENCOUNTER — Ambulatory Visit (INDEPENDENT_AMBULATORY_CARE_PROVIDER_SITE_OTHER): Payer: Medicare Other

## 2020-07-27 DIAGNOSIS — I495 Sick sinus syndrome: Secondary | ICD-10-CM

## 2020-08-03 DIAGNOSIS — I5032 Chronic diastolic (congestive) heart failure: Secondary | ICD-10-CM | POA: Diagnosis not present

## 2020-08-03 DIAGNOSIS — I110 Hypertensive heart disease with heart failure: Secondary | ICD-10-CM | POA: Diagnosis not present

## 2020-08-03 DIAGNOSIS — E7849 Other hyperlipidemia: Secondary | ICD-10-CM | POA: Diagnosis not present

## 2020-08-03 DIAGNOSIS — E039 Hypothyroidism, unspecified: Secondary | ICD-10-CM | POA: Diagnosis not present

## 2020-08-16 NOTE — Progress Notes (Signed)
Remote pacemaker transmission.   

## 2020-08-29 DIAGNOSIS — E039 Hypothyroidism, unspecified: Secondary | ICD-10-CM | POA: Diagnosis not present

## 2020-08-29 DIAGNOSIS — E559 Vitamin D deficiency, unspecified: Secondary | ICD-10-CM | POA: Diagnosis not present

## 2020-08-29 DIAGNOSIS — E871 Hypo-osmolality and hyponatremia: Secondary | ICD-10-CM | POA: Diagnosis not present

## 2020-08-29 DIAGNOSIS — K219 Gastro-esophageal reflux disease without esophagitis: Secondary | ICD-10-CM | POA: Diagnosis not present

## 2020-08-29 DIAGNOSIS — I10 Essential (primary) hypertension: Secondary | ICD-10-CM | POA: Diagnosis not present

## 2020-08-31 DIAGNOSIS — E871 Hypo-osmolality and hyponatremia: Secondary | ICD-10-CM | POA: Diagnosis not present

## 2020-08-31 DIAGNOSIS — I10 Essential (primary) hypertension: Secondary | ICD-10-CM | POA: Diagnosis not present

## 2020-08-31 DIAGNOSIS — J342 Deviated nasal septum: Secondary | ICD-10-CM | POA: Diagnosis not present

## 2020-08-31 DIAGNOSIS — F411 Generalized anxiety disorder: Secondary | ICD-10-CM | POA: Diagnosis not present

## 2020-08-31 DIAGNOSIS — Z6828 Body mass index (BMI) 28.0-28.9, adult: Secondary | ICD-10-CM | POA: Diagnosis not present

## 2020-08-31 DIAGNOSIS — E7849 Other hyperlipidemia: Secondary | ICD-10-CM | POA: Diagnosis not present

## 2020-08-31 DIAGNOSIS — I2510 Atherosclerotic heart disease of native coronary artery without angina pectoris: Secondary | ICD-10-CM | POA: Diagnosis not present

## 2020-08-31 DIAGNOSIS — E039 Hypothyroidism, unspecified: Secondary | ICD-10-CM | POA: Diagnosis not present

## 2020-09-03 DIAGNOSIS — I5032 Chronic diastolic (congestive) heart failure: Secondary | ICD-10-CM | POA: Diagnosis not present

## 2020-09-03 DIAGNOSIS — E039 Hypothyroidism, unspecified: Secondary | ICD-10-CM | POA: Diagnosis not present

## 2020-09-03 DIAGNOSIS — E7849 Other hyperlipidemia: Secondary | ICD-10-CM | POA: Diagnosis not present

## 2020-09-03 DIAGNOSIS — I110 Hypertensive heart disease with heart failure: Secondary | ICD-10-CM | POA: Diagnosis not present

## 2020-09-04 DIAGNOSIS — L570 Actinic keratosis: Secondary | ICD-10-CM | POA: Diagnosis not present

## 2020-09-04 DIAGNOSIS — L280 Lichen simplex chronicus: Secondary | ICD-10-CM | POA: Diagnosis not present

## 2020-10-04 DIAGNOSIS — I5032 Chronic diastolic (congestive) heart failure: Secondary | ICD-10-CM | POA: Diagnosis not present

## 2020-10-04 DIAGNOSIS — E039 Hypothyroidism, unspecified: Secondary | ICD-10-CM | POA: Diagnosis not present

## 2020-10-04 DIAGNOSIS — E7849 Other hyperlipidemia: Secondary | ICD-10-CM | POA: Diagnosis not present

## 2020-10-04 DIAGNOSIS — I110 Hypertensive heart disease with heart failure: Secondary | ICD-10-CM | POA: Diagnosis not present

## 2020-10-05 DIAGNOSIS — H04123 Dry eye syndrome of bilateral lacrimal glands: Secondary | ICD-10-CM | POA: Diagnosis not present

## 2020-10-26 ENCOUNTER — Ambulatory Visit (INDEPENDENT_AMBULATORY_CARE_PROVIDER_SITE_OTHER): Payer: Medicare Other

## 2020-10-26 DIAGNOSIS — I495 Sick sinus syndrome: Secondary | ICD-10-CM

## 2020-10-26 LAB — CUP PACEART REMOTE DEVICE CHECK
Battery Remaining Percentage: 65 %
Brady Statistic RA Percent Paced: 82 %
Brady Statistic RV Percent Paced: 0 %
Date Time Interrogation Session: 20220922122951
Implantable Lead Implant Date: 20180626
Implantable Lead Implant Date: 20180626
Implantable Lead Location: 753859
Implantable Lead Location: 753860
Implantable Lead Model: 377
Implantable Lead Model: 377
Implantable Lead Serial Number: 49791993
Implantable Lead Serial Number: 49865965
Implantable Pulse Generator Implant Date: 20180626
Lead Channel Impedance Value: 449 Ohm
Lead Channel Impedance Value: 468 Ohm
Lead Channel Pacing Threshold Amplitude: 0.7 V
Lead Channel Pacing Threshold Amplitude: 1.1 V
Lead Channel Pacing Threshold Pulse Width: 0.4 ms
Lead Channel Pacing Threshold Pulse Width: 0.4 ms
Lead Channel Sensing Intrinsic Amplitude: 10.1 mV
Lead Channel Sensing Intrinsic Amplitude: 2.4 mV
Lead Channel Setting Pacing Amplitude: 2 V
Lead Channel Setting Pacing Amplitude: 2.4 V
Lead Channel Setting Pacing Pulse Width: 0.4 ms
Pulse Gen Model: 407145
Pulse Gen Serial Number: 69052081

## 2020-11-02 NOTE — Progress Notes (Signed)
Remote pacemaker transmission.   

## 2020-11-03 DIAGNOSIS — I5032 Chronic diastolic (congestive) heart failure: Secondary | ICD-10-CM | POA: Diagnosis not present

## 2020-11-03 DIAGNOSIS — E039 Hypothyroidism, unspecified: Secondary | ICD-10-CM | POA: Diagnosis not present

## 2020-11-03 DIAGNOSIS — E7849 Other hyperlipidemia: Secondary | ICD-10-CM | POA: Diagnosis not present

## 2020-11-03 DIAGNOSIS — I110 Hypertensive heart disease with heart failure: Secondary | ICD-10-CM | POA: Diagnosis not present

## 2020-11-24 DIAGNOSIS — R5383 Other fatigue: Secondary | ICD-10-CM | POA: Diagnosis not present

## 2020-11-24 DIAGNOSIS — E782 Mixed hyperlipidemia: Secondary | ICD-10-CM | POA: Diagnosis not present

## 2020-11-24 DIAGNOSIS — E039 Hypothyroidism, unspecified: Secondary | ICD-10-CM | POA: Diagnosis not present

## 2020-11-24 DIAGNOSIS — E7849 Other hyperlipidemia: Secondary | ICD-10-CM | POA: Diagnosis not present

## 2020-11-26 NOTE — Progress Notes (Signed)
Cardiology Office Note  Date: 11/27/2020   ID: Kimbly, Eanes 01/28/1946, MRN [id number]  PCP:  [name], MD  Cardiologist:  [name], MD Electrophysiologist:  Cristopher Peru, MD   Chief Complaint  Patient presents with   Cardiac follow-up    History of Present Illness: Amy [name] is a 75 y.o. female last seen in April.  She is here for a routine visit.  Reports no active angina symptoms on current medical regimen, NYHA class I-II dyspnea.  She has a Biotronik pacemaker in place followed by Dr. Lovena Le.  Device interrogation in September revealed normal function.  I personally reviewed her ECG today which shows an atrial paced rhythm.  Lexiscan Myoview and April was negative for ischemia with LVEF 64%.  Carotid Dopplers in July 2021 revealed 50 to 69% bilateral ICA stenosis.  We discussed arranging a follow-up study.  Also plan to obtain recent lab work, she has a visit pending with Dr. Pleas Koch.  I reviewed her medications which are noted below.  Past Medical History:  Diagnosis Date   Asthma    CAD (coronary artery disease)    60-70% proximal LAD/diagonal bifurcational disease October 2015 at [facility] status DES x 2 to LAD/diagonal October 2015   Essential hypertension    Hyperlipidemia    Hypothyroidism    Sinus node dysfunction ([place])    Biotronik pacemaker in place    Past Surgical History:  Procedure Laterality Date   ABDOMINAL HYSTERECTOMY     ANAL FISSURE REPAIR     CATARACT EXTRACTION W/PHACO Right 10/17/2014   Procedure: CATARACT EXTRACTION PHACO AND INTRAOCULAR LENS PLACEMENT ([place]);  Surgeon: Tonny Branch, MD;  [facility]: [facility];  Service: Ophthalmology;  Laterality: Right;  CDE: 4.88   CATARACT EXTRACTION W/PHACO Left 10/27/2014   Procedure: CATARACT EXTRACTION PHACO AND INTRAOCULAR LENS PLACEMENT (IOC);  Surgeon: Tonny Branch, MD;  Location: [facility];  Service: Ophthalmology;  Laterality: Left;  CDE:7.86   COLONOSCOPY N/A 04/16/2018   Procedure:  COLONOSCOPY;  Surgeon: Rogene Houston, MD;  [facility];  Service: Endoscopy;  Laterality: N/A;  [place]     NASAL SINUS SURGERY     PACEMAKER IMPLANT N/A 07/30/2016   Procedure: Pacemaker Implant Dual Chamber;  Surgeon: Evans Lance, MD;  Location: [place] CV LAB;  Service: Cardiovascular;  Laterality: N/A;   TONSILLECTOMY      Current Outpatient Medications  Medication Sig Dispense Refill   acetaminophen (TYLENOL) 650 MG CR tablet Take 650 mg by mouth daily as needed for pain.     aspirin 81 MG tablet Take 81 mg by mouth daily.     atorvastatin (LIPITOR) 20 MG tablet Take 20 mg by mouth at bedtime.   3   Biotin 5 MG CAPS Take 5 mg by mouth daily at 12 noon.      Cholecalciferol (VITAMIN D3) 2000 units capsule Take 2,000 Units by mouth daily at 12 noon.      clobetasol (TEMOVATE) 0.05 % external solution Apply 1 application topically daily as needed (psoriasis).      clopidogrel (PLAVIX) 75 MG tablet Take 1 tablet (75 mg total) by mouth daily. 90 tablet 3   fexofenadine (ALLEGRA) 180 MG tablet Take 180 mg by mouth at bedtime.     hydrochlorothiazide (MICROZIDE) 12.5 MG capsule Take 12.5 mg by mouth daily.     levothyroxine (SYNTHROID) 75 MCG tablet Take 75 mcg by mouth every morning.  lisinopril (ZESTRIL) 5 MG tablet Take 5 mg by mouth 2 (two) times daily.     Multiple Vitamin (MULTIVITAMIN WITH MINERALS) TABS tablet Take 1 tablet by mouth daily at 12 noon.      ondansetron (ZOFRAN ODT) 4 MG disintegrating tablet Take 1 tablet (4 mg total) by mouth every 6 (six) hours as needed for nausea. 4 tablet 0   triamcinolone cream (KENALOG) 0.1 % Apply 1 application topically daily as needed (psoriasis). Mixed with nystatin cream 1:1     vitamin C (ASCORBIC ACID) 500 MG tablet Take 500 mg by mouth daily at 12 noon.     No current facility-administered medications for this visit.   Allergies:  Sulfa antibiotics   ROS: No palpitations or syncope.  Physical  Exam: VS:  BP 140/60   Pulse 66   Ht 4\' 11"  (1.499 m)   Wt 141 lb 9.6 oz (64.2 kg)   SpO2 99%   BMI 28.60 kg/m , BMI Body mass index is 28.6 kg/m.  Wt Readings from Last 3 Encounters:  11/27/20 141 lb 9.6 oz (64.2 kg)  07/25/20 152 lb 6.4 oz (69.1 kg)  06/15/20 160 lb (72.6 kg)    General: Patient appears comfortable at rest. HEENT: Conjunctiva and lids normal, wearing a mask. Neck: Supple, no elevated JVP or carotid bruits, no thyromegaly. Lungs: Clear to auscultation, nonlabored breathing at rest. Cardiac: Regular rate and rhythm, no S3, 2/6 systolic murmur. Abdomen: Soft, nontender, bowel sounds present. Extremities: No pitting edema.  ECG:  An ECG dated 11/08/2019 was personally reviewed today and demonstrated:  Atrial paced rhythm.  Recent Labwork: 06/15/2020: ALT 29; AST 29; BUN 29; Creatinine, Ser 1.04; Hemoglobin 14.2; Platelets 173; Potassium 3.8; Sodium 127  November 2021: Cholesterol 159, triglycerides 40, HDL 87, LDL 63  Other Studies Reviewed Today:  Echocardiogram 08/20/2019:  1. Left ventricular ejection fraction, by estimation, is >75%. The left  ventricle has hyperdynamic function. The left ventricle has no regional  wall motion abnormalities. There is mild left ventricular hypertrophy.  Left ventricular diastolic parameters  are consistent with Grade I diastolic dysfunction (impaired relaxation).   2. Right ventricular systolic function is normal. The right ventricular  size is normal. There is normal pulmonary artery systolic pressure.   3. Left atrial size was mildly dilated.   4. The mitral valve is abnormal. Trivial mitral valve regurgitation.   5. AV is thickened, calcified with minimally restricted motion. Peak and  mean gradients through the valve are 19 and 10 mm Hg respectively  consistent with very mild AS.Marland Kitchen The aortic valve is tricuspid. Aortic valve  regurgitation is not visualized.   Carotid Doppler 08/20/2019: IMPRESSION: 1. Moderate amount  of left-sided atherosclerotic plaque results in elevated peak systolic velocities within the left internal carotid artery compatible with the 50-69% luminal narrowing range. Further evaluation with CTA could performed as clinically indicated. 2. Moderate amount of right-sided atherosclerotic plaque, not resulting in a hemodynamically significant stenosis.  Lexiscan Myoview 05/29/2020: No diagnostic ST segment changes to indicate ischemia. Rare PACs and PVCs noted. Small, mild intensity, apical anterior defect that is fixed and most consistent with soft tissue attenuation. No ischemic territories. This is a low risk study. Nuclear stress EF: 64%.  Assessment and Plan:  1.  CAD status post DES x2 to the LAD/diagonal bifurcation in 2015.  She reports no active angina and remains on long-term DAPT without bleeding problems.  Follow-up Myoview in April was low risk.  Continue aspirin, Plavix, Lipitor, and lisinopril.  2.  Carotid artery disease, moderate 50 to 32% LICA stenosis, not hemodynamically significant on the right by Dopplers in July 2021.  Continue antiplatelet regimen and statin.  Follow-up carotid Dopplers will be obtained.  3.  Mixed hyperlipidemia, on Lipitor.  Last LDL was 63.  Requesting recent lab work.  4.  Mild aortic stenosis, no change in cardiac murmur.  5.  Sinus node dysfunction with Biotronik pacemaker in place and followed by Dr. Lovena Le.  Medication Adjustments/Labs and Tests Ordered: Current medicines are reviewed at length with the patient today.  Concerns regarding medicines are outlined above.   Tests Ordered: Orders Placed This Encounter  Procedures   EKG 12-Lead   VAS US CAROTID    Medication Changes: No orders of the defined types were placed in this encounter.   Disposition:  Follow up  6 months.  Signed, Satira Sark, MD, [facility] 11/27/2020 9:11 AM    [facility] [place], [place], [place] [address] Phone:  [phone]; Fax: [phone]

## 2020-11-27 ENCOUNTER — Ambulatory Visit (INDEPENDENT_AMBULATORY_CARE_PROVIDER_SITE_OTHER): Payer: Medicare Other | Admitting: Cardiology

## 2020-11-27 ENCOUNTER — Encounter: Payer: Self-pay | Admitting: *Deleted

## 2020-11-27 ENCOUNTER — Encounter: Payer: Self-pay | Admitting: Cardiology

## 2020-11-27 VITALS — BP 140/60 | HR 66 | Ht 59.0 in | Wt 141.6 lb

## 2020-11-27 DIAGNOSIS — I25119 Atherosclerotic heart disease of native coronary artery with unspecified angina pectoris: Secondary | ICD-10-CM | POA: Diagnosis not present

## 2020-11-27 DIAGNOSIS — I6523 Occlusion and stenosis of bilateral carotid arteries: Secondary | ICD-10-CM

## 2020-11-27 DIAGNOSIS — I495 Sick sinus syndrome: Secondary | ICD-10-CM | POA: Diagnosis not present

## 2020-11-27 DIAGNOSIS — E782 Mixed hyperlipidemia: Secondary | ICD-10-CM

## 2020-11-27 NOTE — Patient Instructions (Addendum)
Medication Instructions:  Your physician recommends that you continue on your current medications as directed. Please refer to the Current Medication list given to you today.  Labwork: none  Testing/Procedures: Your physician has requested that you have a carotid duplex. This test is an ultrasound of the carotid arteries in your neck. It looks at blood flow through these arteries that supply the brain with blood. Allow one hour for this exam. There are no restrictions or special instructions.  Follow-Up: Your physician recommends that you schedule a follow-up appointment in: 6 months  Any Other Special Instructions Will Be Listed Below (If Applicable).  If you need a refill on your cardiac medications before your next appointment, please call your pharmacy. 

## 2020-11-30 DIAGNOSIS — I10 Essential (primary) hypertension: Secondary | ICD-10-CM | POA: Diagnosis not present

## 2020-11-30 DIAGNOSIS — E7849 Other hyperlipidemia: Secondary | ICD-10-CM | POA: Diagnosis not present

## 2020-11-30 DIAGNOSIS — I2510 Atherosclerotic heart disease of native coronary artery without angina pectoris: Secondary | ICD-10-CM | POA: Diagnosis not present

## 2020-11-30 DIAGNOSIS — Z6827 Body mass index (BMI) 27.0-27.9, adult: Secondary | ICD-10-CM | POA: Diagnosis not present

## 2020-11-30 DIAGNOSIS — J309 Allergic rhinitis, unspecified: Secondary | ICD-10-CM | POA: Diagnosis not present

## 2020-11-30 DIAGNOSIS — E039 Hypothyroidism, unspecified: Secondary | ICD-10-CM | POA: Diagnosis not present

## 2020-11-30 DIAGNOSIS — E871 Hypo-osmolality and hyponatremia: Secondary | ICD-10-CM | POA: Diagnosis not present

## 2020-11-30 DIAGNOSIS — F411 Generalized anxiety disorder: Secondary | ICD-10-CM | POA: Diagnosis not present

## 2020-12-04 DIAGNOSIS — L570 Actinic keratosis: Secondary | ICD-10-CM | POA: Diagnosis not present

## 2020-12-04 DIAGNOSIS — L409 Psoriasis, unspecified: Secondary | ICD-10-CM | POA: Diagnosis not present

## 2020-12-04 DIAGNOSIS — L280 Lichen simplex chronicus: Secondary | ICD-10-CM | POA: Diagnosis not present

## 2021-01-03 DIAGNOSIS — I5032 Chronic diastolic (congestive) heart failure: Secondary | ICD-10-CM | POA: Diagnosis not present

## 2021-01-03 DIAGNOSIS — I110 Hypertensive heart disease with heart failure: Secondary | ICD-10-CM | POA: Diagnosis not present

## 2021-01-03 DIAGNOSIS — E7849 Other hyperlipidemia: Secondary | ICD-10-CM | POA: Diagnosis not present

## 2021-01-18 DIAGNOSIS — Z20828 Contact with and (suspected) exposure to other viral communicable diseases: Secondary | ICD-10-CM | POA: Diagnosis not present

## 2021-01-25 ENCOUNTER — Ambulatory Visit (INDEPENDENT_AMBULATORY_CARE_PROVIDER_SITE_OTHER): Payer: Medicare Other

## 2021-01-25 DIAGNOSIS — I6523 Occlusion and stenosis of bilateral carotid arteries: Secondary | ICD-10-CM | POA: Diagnosis not present

## 2021-01-25 DIAGNOSIS — I495 Sick sinus syndrome: Secondary | ICD-10-CM

## 2021-01-25 LAB — CUP PACEART REMOTE DEVICE CHECK
Battery Remaining Percentage: 65 %
Brady Statistic RA Percent Paced: 81 %
Brady Statistic RV Percent Paced: 0 %
Date Time Interrogation Session: 20221222075448
Implantable Lead Implant Date: 20180626
Implantable Lead Implant Date: 20180626
Implantable Lead Location: 753859
Implantable Lead Location: 753860
Implantable Lead Model: 377
Implantable Lead Model: 377
Implantable Lead Serial Number: 49791993
Implantable Lead Serial Number: 49865965
Implantable Pulse Generator Implant Date: 20180626
Lead Channel Impedance Value: 449 Ohm
Lead Channel Impedance Value: 468 Ohm
Lead Channel Pacing Threshold Amplitude: 0.6 V
Lead Channel Pacing Threshold Amplitude: 1.2 V
Lead Channel Pacing Threshold Pulse Width: 0.4 ms
Lead Channel Pacing Threshold Pulse Width: 0.4 ms
Lead Channel Sensing Intrinsic Amplitude: 2.9 mV
Lead Channel Sensing Intrinsic Amplitude: 9.1 mV
Lead Channel Setting Pacing Amplitude: 2 V
Lead Channel Setting Pacing Amplitude: 2.4 V
Lead Channel Setting Pacing Pulse Width: 0.4 ms
Pulse Gen Model: 407145
Pulse Gen Serial Number: 69052081

## 2021-01-31 IMAGING — US US CAROTID DUPLEX BILAT
1 series · 13 of 24 positions shown · non-contrast
Comparison: None.

CLINICAL DATA: Right-sided carotid bruit. History of hypertension,
hyperlipidemia and CAD.

EXAM:
BILATERAL CAROTID DUPLEX ULTRASOUND
TECHNIQUE: Gray scale imaging, color Doppler and duplex ultrasound were
performed of bilateral carotid and vertebral arteries in the neck.

[Series 1: us carotid bilateral · 13 of 68 slices shown]
[im 1/68]
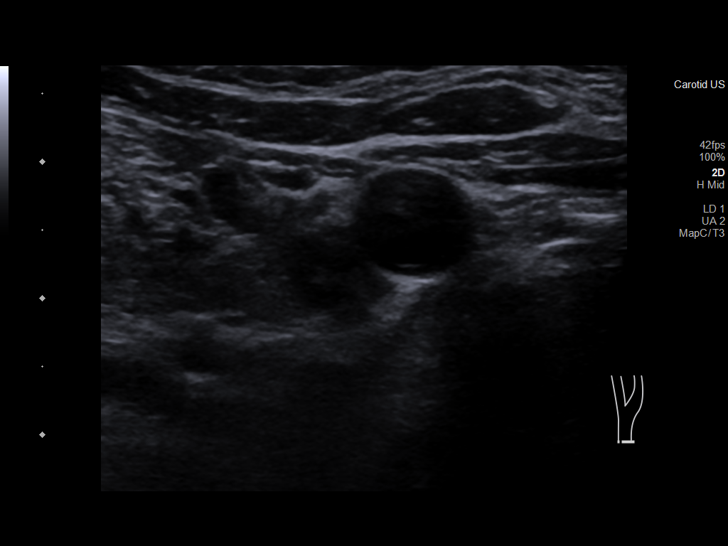
[im 6/68]
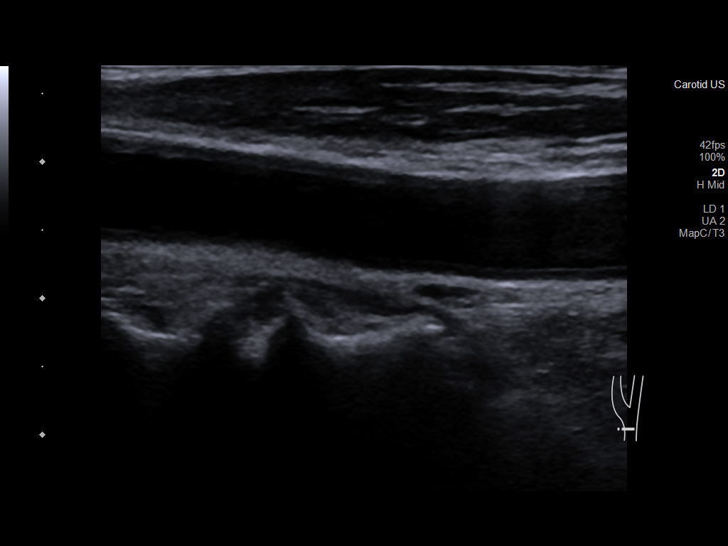
[im 12/68]
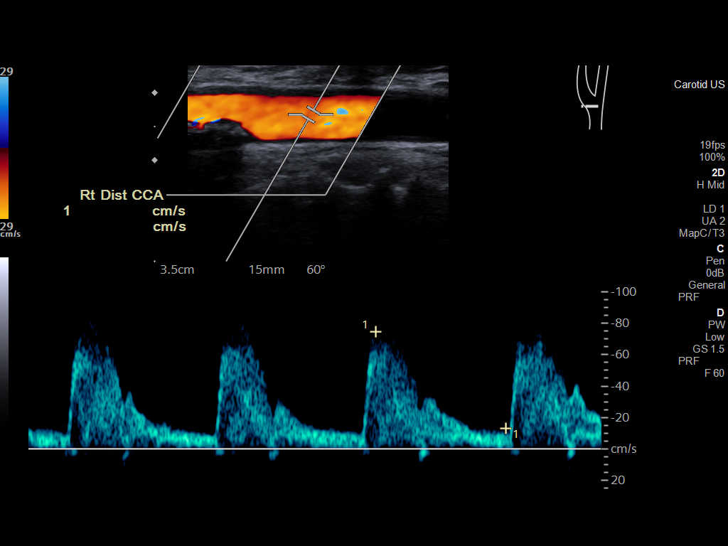
[im 18/68]
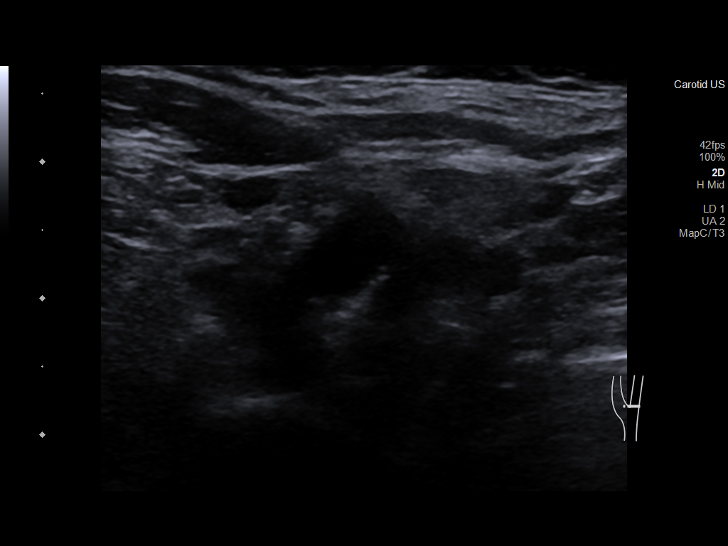
[im 24/68]
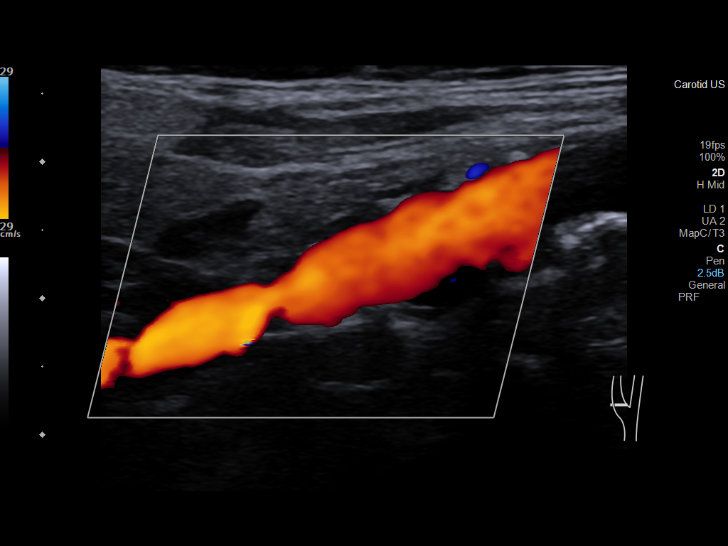
[im 30/68]
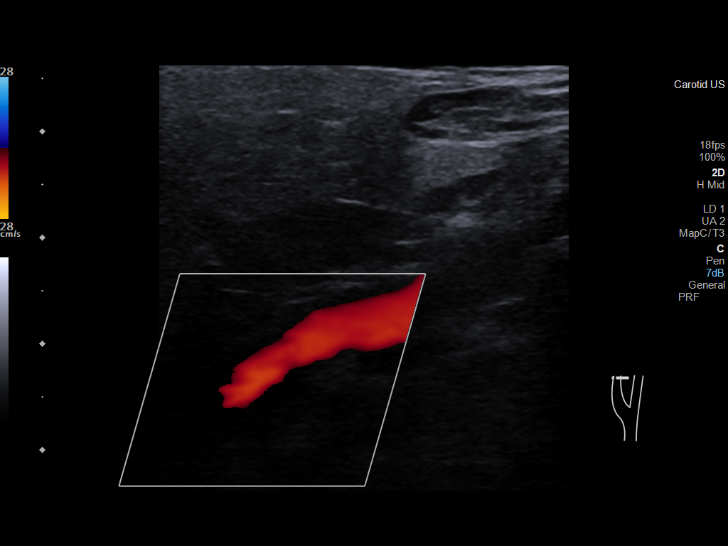
[im 35/68]
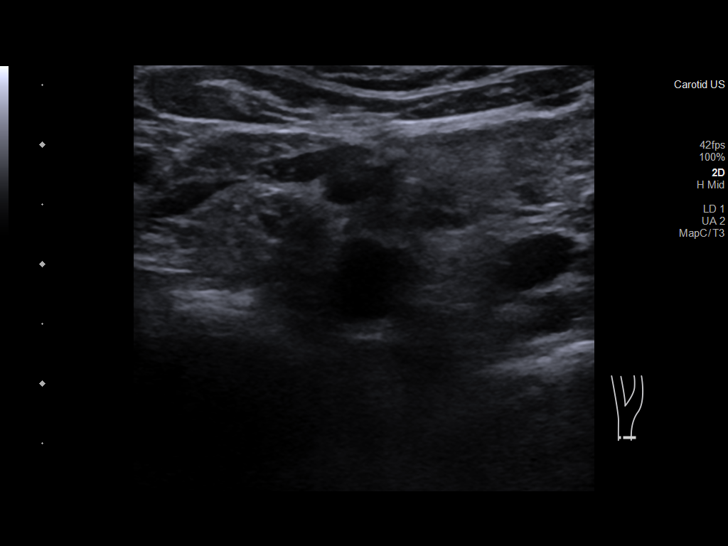
[im 38/68]
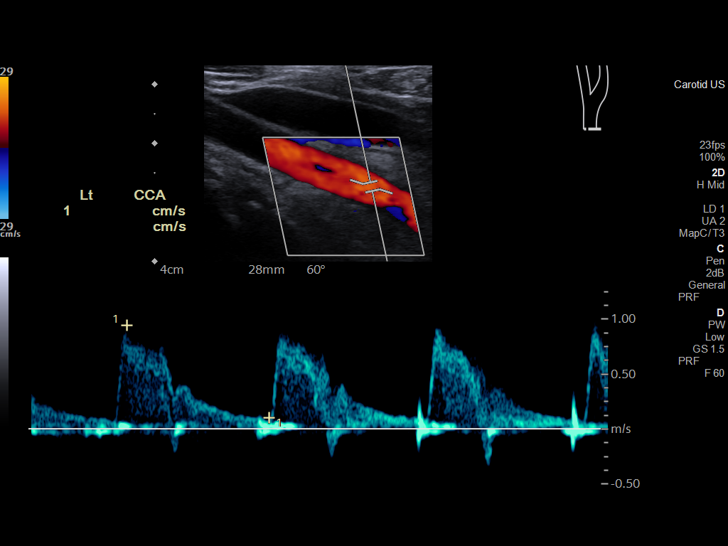
[im 44/68]
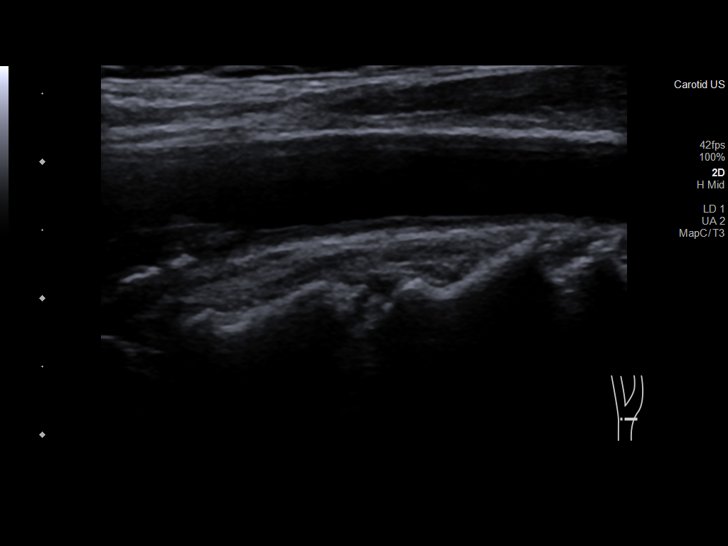
[im 50/68]
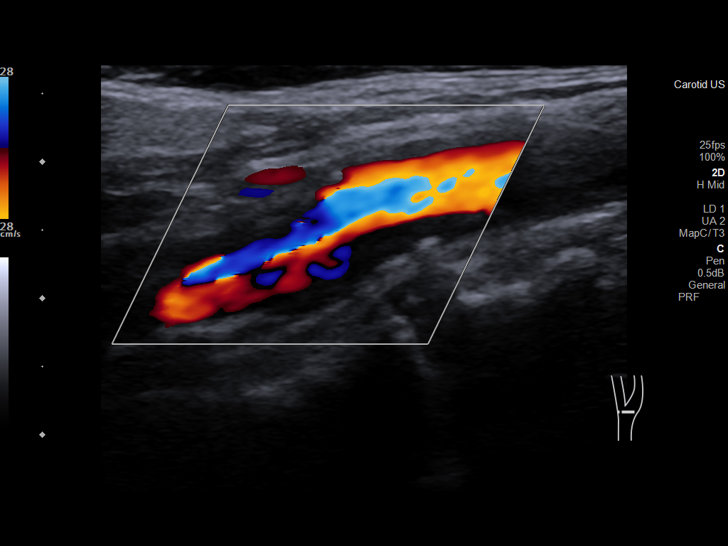
[im 56/68]
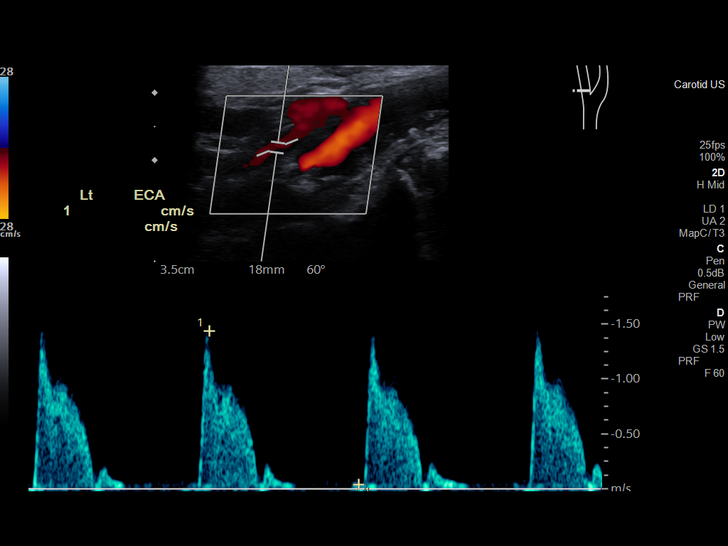
[im 62/68]
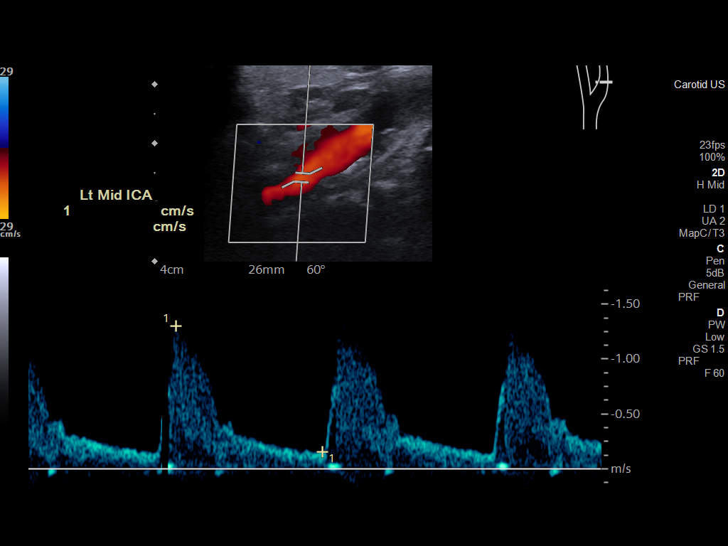
[im 68/68]
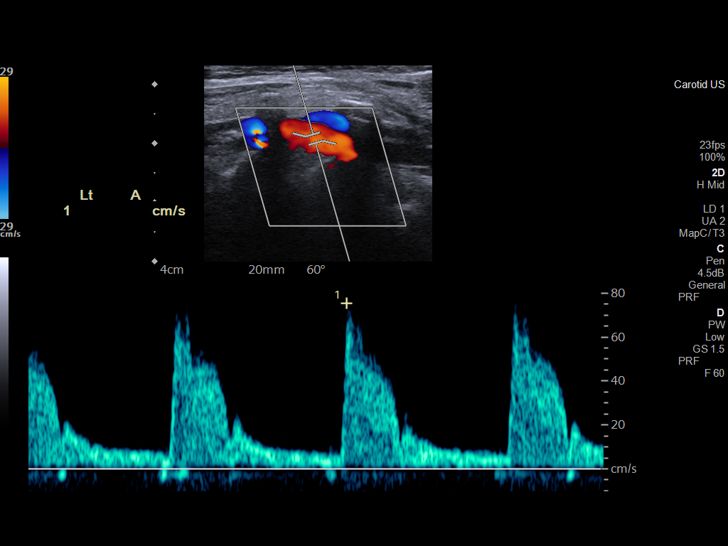

[13 of 24 positions shown; findings below may reference images not displayed]

FINDINGS: Criteria: Quantification of carotid stenosis is based on velocity
parameters that correlate the residual internal carotid diameter
with NASCET-based stenosis levels, using the diameter of the distal
internal carotid lumen as the denominator for stenosis measurement.

The following velocity measurements were obtained:

RIGHT

ICA: 104/20 cm/sec

CCA: 94/11 cm/sec

SYSTOLIC ICA/CCA RATIO:

ECA: 155 cm/sec

LEFT

ICA: 157/14 cm/sec

CCA: 110/12 cm/sec

SYSTOLIC ICA/CCA RATIO:

ECA: 144 cm/sec

RIGHT CAROTID ARTERY: There is a moderate amount of eccentric
echogenic plaque within the right carotid bulb (image 14 and 16),
extending to involve the origin and proximal aspects of the right
internal carotid artery (image 24), not resulting in elevated peak
systolic velocities within the interrogated course of the right
internal carotid artery to suggest a hemodynamically significant
stenosis.

RIGHT VERTEBRAL ARTERY:  Antegrade flow

LEFT CAROTID ARTERY: There is a moderate amount of eccentric mixed
echogenic plaque within the left carotid bulb (image 50), extending
to involve the origin and proximal aspects of the left internal
carotid artery (image 58), which results in elevated peak systolic
velocities within the proximal and mid aspects of the left internal
carotid artery. Greatest acquired peak systolic velocity with the
proximal left ICA measures 157 centimeters/second (image 60).

LEFT VERTEBRAL ARTERY:  Antegrade flow
IMPRESSION: 1. Moderate amount of left-sided atherosclerotic plaque results in
elevated peak systolic velocities within the left internal carotid
artery compatible with the 50-69% luminal narrowing range. Further
evaluation with CTA could performed as clinically indicated.
2. Moderate amount of right-sided atherosclerotic plaque, not
resulting in a hemodynamically significant stenosis.

## 2021-02-06 NOTE — Progress Notes (Signed)
Remote pacemaker transmission.   

## 2021-02-07 ENCOUNTER — Telehealth: Payer: Self-pay | Admitting: Cardiology

## 2021-02-07 NOTE — Telephone Encounter (Signed)
-----   Message from Satira Sark, MD sent at 01/27/2021  7:46 AM EST ----- Results reviewed. Mild bilateral ICA stenosis. Continue medical therapy and keep follow-up as scheduled.

## 2021-02-07 NOTE — Telephone Encounter (Signed)
° °  Pt is returning call to get vascular results

## 2021-02-07 NOTE — Telephone Encounter (Signed)
Patient informed. Copy sent to PCP °

## 2021-02-26 DIAGNOSIS — L280 Lichen simplex chronicus: Secondary | ICD-10-CM | POA: Diagnosis not present

## 2021-02-27 DIAGNOSIS — E039 Hypothyroidism, unspecified: Secondary | ICD-10-CM | POA: Diagnosis not present

## 2021-02-27 DIAGNOSIS — R5383 Other fatigue: Secondary | ICD-10-CM | POA: Diagnosis not present

## 2021-03-04 DIAGNOSIS — I110 Hypertensive heart disease with heart failure: Secondary | ICD-10-CM | POA: Diagnosis not present

## 2021-03-04 DIAGNOSIS — E7849 Other hyperlipidemia: Secondary | ICD-10-CM | POA: Diagnosis not present

## 2021-03-04 DIAGNOSIS — E039 Hypothyroidism, unspecified: Secondary | ICD-10-CM | POA: Diagnosis not present

## 2021-03-04 DIAGNOSIS — I5032 Chronic diastolic (congestive) heart failure: Secondary | ICD-10-CM | POA: Diagnosis not present

## 2021-03-13 ENCOUNTER — Other Ambulatory Visit: Payer: Self-pay | Admitting: *Deleted

## 2021-03-13 MED ORDER — CLOPIDOGREL BISULFATE 75 MG PO TABS: 75.0000 mg | ORAL_TABLET | Freq: Every day | ORAL | 3 refills | Status: DC

## 2021-04-03 DIAGNOSIS — I5032 Chronic diastolic (congestive) heart failure: Secondary | ICD-10-CM | POA: Diagnosis not present

## 2021-04-03 DIAGNOSIS — I110 Hypertensive heart disease with heart failure: Secondary | ICD-10-CM | POA: Diagnosis not present

## 2021-04-04 DIAGNOSIS — L280 Lichen simplex chronicus: Secondary | ICD-10-CM | POA: Diagnosis not present

## 2021-04-12 DIAGNOSIS — Z1231 Encounter for screening mammogram for malignant neoplasm of breast: Secondary | ICD-10-CM | POA: Diagnosis not present

## 2021-04-26 ENCOUNTER — Ambulatory Visit (INDEPENDENT_AMBULATORY_CARE_PROVIDER_SITE_OTHER): Payer: Medicare Other

## 2021-04-26 DIAGNOSIS — I495 Sick sinus syndrome: Secondary | ICD-10-CM

## 2021-04-30 LAB — CUP PACEART REMOTE DEVICE CHECK
Date Time Interrogation Session: 20230323065336
Implantable Lead Implant Date: 20180626
Implantable Lead Implant Date: 20180626
Implantable Lead Location: 753859
Implantable Lead Location: 753860
Implantable Lead Model: 377
Implantable Lead Model: 377
Implantable Lead Serial Number: 49791993
Implantable Lead Serial Number: 49865965
Implantable Pulse Generator Implant Date: 20180626
Pulse Gen Model: 407145
Pulse Gen Serial Number: 69052081

## 2021-05-04 DIAGNOSIS — I110 Hypertensive heart disease with heart failure: Secondary | ICD-10-CM | POA: Diagnosis not present

## 2021-05-04 DIAGNOSIS — E7849 Other hyperlipidemia: Secondary | ICD-10-CM | POA: Diagnosis not present

## 2021-05-04 DIAGNOSIS — E039 Hypothyroidism, unspecified: Secondary | ICD-10-CM | POA: Diagnosis not present

## 2021-05-07 NOTE — Progress Notes (Signed)
Remote pacemaker transmission.   

## 2021-05-09 NOTE — Progress Notes (Signed)
? ? ?Cardiology Office Note ? ?Date: 05/10/2021  ? ?ID: Amy [name], DOB 24-Mar-1945, MRN [id number] ? ?PCP:  [name], MD  ?Cardiologist:  [name], MD ?Electrophysiologist:  Cristopher Peru, MD  ? ?Chief Complaint  ?Patient presents with  ? Cardiac follow-up  ? ? ?History of Present Illness: ?Amy [name] is a 76 y.o. female last seen in October 2022.  She is here for a routine visit.  She does not report any active angina symptoms.  States that she has been exercising 20 minutes at a time using elliptical machine or walking around her house.  She does this 4 to 5 days a week. ? ?Biotronik pacemaker in place with followed by Dr. Lovena Le.  Device check indicated normal function.  She has not had any dizziness or syncope. ? ?I reviewed her medications which are noted below and stable from a cardiac perspective.  She continues on Lipitor with last LDL 64. ? ?Follow-up carotid Dopplers in December 2022 showed only mild bilateral ICA stenosis.  She is asymptomatic. ? ?Past Medical History:  ?Diagnosis Date  ? Asthma   ? CAD (coronary artery disease)   ? 60-70% proximal LAD/diagonal bifurcational disease October 2015 at [facility] status DES x 2 to LAD/diagonal October 2015  ? Essential hypertension   ? Hyperlipidemia   ? Hypothyroidism   ? Sinus node dysfunction ([facility])   ? Biotronik pacemaker in place  ? ? ?Past Surgical History:  ?Procedure Laterality Date  ? ABDOMINAL HYSTERECTOMY    ? ANAL FISSURE REPAIR    ? CATARACT EXTRACTION W/PHACO Right 10/17/2014  ? Procedure: CATARACT EXTRACTION PHACO AND INTRAOCULAR LENS PLACEMENT ([facility]);  Surgeon: Tonny Branch, MD;  Location: [facility];  Service: Ophthalmology;  Laterality: Right;  CDE: 4.88  ? CATARACT EXTRACTION W/PHACO Left 10/27/2014  ? Procedure: CATARACT EXTRACTION PHACO AND INTRAOCULAR LENS PLACEMENT ([facility]);  Surgeon: Tonny Branch, MD;  Location: [facility];  Service: Ophthalmology;  Laterality: Left;  CDE:7.86  ? COLONOSCOPY N/A 04/16/2018  ? Procedure: COLONOSCOPY;   Surgeon: Rogene Houston, MD;  Location: [facility];  Service: Endoscopy;  Laterality: N/A;  830  ? FINGER SURGERY    ? NASAL SINUS SURGERY    ? PACEMAKER IMPLANT N/A 07/30/2016  ? Procedure: Pacemaker Implant Dual Chamber;  Surgeon: Evans Lance, MD;  Location: [place] CV LAB;  Service: Cardiovascular;  Laterality: N/A;  ? TONSILLECTOMY    ? ? ?Current Outpatient Medications  ?Medication Sig Dispense Refill  ? acetaminophen (TYLENOL) 650 MG CR tablet Take 650 mg by mouth daily as needed for pain.    ? aspirin 81 MG tablet Take 81 mg by mouth daily.    ? atorvastatin (LIPITOR) 20 MG tablet Take 20 mg by mouth at bedtime.   3  ? Biotin 5 MG CAPS Take 5 mg by mouth daily at 12 noon.     ? Cholecalciferol (VITAMIN D3) 2000 units capsule Take 2,000 Units by mouth daily at 12 noon.     ? clobetasol (TEMOVATE) 0.05 % external solution Apply 1 application topically daily as needed (psoriasis).     ? clopidogrel (PLAVIX) 75 MG tablet Take 1 tablet (75 mg total) by mouth daily. 90 tablet 3  ? fexofenadine (ALLEGRA) 180 MG tablet Take 180 mg by mouth at bedtime.    ? hydrochlorothiazide (MICROZIDE) 12.5 MG capsule Take 12.5 mg by mouth daily.    ? levothyroxine (SYNTHROID) 75 MCG tablet Take 75 mcg by mouth every morning.    ?  lisinopril (ZESTRIL) 5 MG tablet Take 5 mg by mouth 2 (two) times daily.    ? Multiple Vitamin (MULTIVITAMIN WITH MINERALS) TABS tablet Take 1 tablet by mouth daily at 12 noon.     ? triamcinolone cream (KENALOG) 0.1 % Apply 1 application topically daily as needed (psoriasis). Mixed with nystatin cream 1:1    ? ?No current facility-administered medications for this visit.  ? ?Allergies:  Sulfa antibiotics  ? ?ROS: No orthopnea or PND. ? ?Physical Exam: ?VS:  BP 118/70   Pulse 69   Ht '4\' 11"'$  (1.499 m)   Wt 143 lb 6.4 oz (65 kg)   SpO2 99%   BMI 28.96 kg/m? , BMI Body mass index is 28.96 kg/m?. ? ?Wt Readings from Last 3 Encounters:  ?05/10/21 143 lb 6.4 oz (65 kg)  ?11/27/20 141 lb 9.6 oz  (64.2 kg)  ?07/25/20 152 lb 6.4 oz (69.1 kg)  ?  ?General: Patient appears comfortable at rest. ?HEENT: Conjunctiva and lids normal. ?Neck: Supple, no elevated JVP or carotid bruits, no thyromegaly. ?Lungs: Clear to auscultation, nonlabored breathing at rest. ?Cardiac: Regular rate and rhythm, no S3, 2/6 systolic murmur, no pericardial rub. ?Extremities: No pitting edema. ? ?ECG:  An ECG dated 11/27/2020 was personally reviewed today and demonstrated:  Atrial paced rhythm. ? ?Recent Labwork: ?06/15/2020: ALT 29; AST 29; BUN 29; Creatinine, Ser 1.04; Hemoglobin 14.2; Platelets 173; Potassium 3.8; Sodium 127  ?October 2022: TSH 2.64, hemoglobin 13.3, platelets 183, cholesterol 153, triglycerides 53, HDL 78, LDL 64 ? ?Other Studies Reviewed Today: ? ?Lexiscan Myoview 05/29/2020: ?No diagnostic ST segment changes to indicate ischemia. Rare PACs and PVCs noted. ?Small, mild intensity, apical anterior defect that is fixed and most consistent with soft tissue attenuation. No ischemic territories. ?This is a low risk study. ?Nuclear stress EF: 64%. ? ?Carotid Dopplers 01/25/2021: ?Summary:  ?Right Carotid: Velocities in the right ICA are consistent with a 1-39%  ?stenosis.  ? ?Left Carotid: Velocities in the left ICA are consistent with a 1-39%  ?stenosis.  ? ?Vertebrals:  Bilateral vertebral arteries demonstrate antegrade flow.  ?Subclavians: Normal flow hemodynamics were seen in bilateral subclavian  ?             arteries.  ? ?Assessment and Plan: ? ?1.  CAD status post DES x2 to the LAD/diagonal bifurcation in 2015.  He is doing well, continues on long-term dual antiplatelet therapy.  Continue aspirin, Plavix, lisinopril, and Lipitor. ? ?2.  Mixed hyperlipidemia on Lipitor.  Last LDL 64. ? ?3.  Asymptomatic, mild ICA atherosclerosis.  Continue antiplatelet regimen and Lipitor. ? ?4.  Mild, calcific aortic stenosis.  She is asymptomatic. ? ?5.  Sinus node dysfunction with Biotronik pacemaker, followed by Dr.  Lovena Le. ? ?Medication Adjustments/Labs and Tests Ordered: ?Current medicines are reviewed at length with the patient today.  Concerns regarding medicines are outlined above.  ? ?Tests Ordered: ?No orders of the defined types were placed in this encounter. ? ? ?Medication Changes: ?No orders of the defined types were placed in this encounter. ? ? ?Disposition:  Follow up  6 months. ? ?Signed, ?Satira Sark, MD, [facility] ?05/10/2021 10:12 AM    ?[place] ?[place], [place], [place] [address] ?Phone: [phone]; Fax: [phone]  ?

## 2021-05-10 ENCOUNTER — Encounter: Payer: Self-pay | Admitting: Cardiology

## 2021-05-10 ENCOUNTER — Ambulatory Visit (INDEPENDENT_AMBULATORY_CARE_PROVIDER_SITE_OTHER): Payer: Medicare Other | Admitting: Cardiology

## 2021-05-10 VITALS — BP 118/70 | HR 69 | Ht 59.0 in | Wt 143.4 lb

## 2021-05-10 DIAGNOSIS — I25119 Atherosclerotic heart disease of native coronary artery with unspecified angina pectoris: Secondary | ICD-10-CM

## 2021-05-10 DIAGNOSIS — E782 Mixed hyperlipidemia: Secondary | ICD-10-CM

## 2021-05-10 DIAGNOSIS — I495 Sick sinus syndrome: Secondary | ICD-10-CM

## 2021-05-10 DIAGNOSIS — I350 Nonrheumatic aortic (valve) stenosis: Secondary | ICD-10-CM

## 2021-05-10 NOTE — Patient Instructions (Addendum)

## 2021-05-14 DIAGNOSIS — E871 Hypo-osmolality and hyponatremia: Secondary | ICD-10-CM | POA: Diagnosis not present

## 2021-05-14 DIAGNOSIS — E039 Hypothyroidism, unspecified: Secondary | ICD-10-CM | POA: Diagnosis not present

## 2021-05-14 DIAGNOSIS — E7849 Other hyperlipidemia: Secondary | ICD-10-CM | POA: Diagnosis not present

## 2021-05-14 DIAGNOSIS — I10 Essential (primary) hypertension: Secondary | ICD-10-CM | POA: Diagnosis not present

## 2021-05-14 DIAGNOSIS — K219 Gastro-esophageal reflux disease without esophagitis: Secondary | ICD-10-CM | POA: Diagnosis not present

## 2021-05-14 DIAGNOSIS — E559 Vitamin D deficiency, unspecified: Secondary | ICD-10-CM | POA: Diagnosis not present

## 2021-05-14 DIAGNOSIS — E782 Mixed hyperlipidemia: Secondary | ICD-10-CM | POA: Diagnosis not present

## 2021-05-18 DIAGNOSIS — Z1331 Encounter for screening for depression: Secondary | ICD-10-CM | POA: Diagnosis not present

## 2021-05-18 DIAGNOSIS — Z1389 Encounter for screening for other disorder: Secondary | ICD-10-CM | POA: Diagnosis not present

## 2021-05-18 DIAGNOSIS — I2510 Atherosclerotic heart disease of native coronary artery without angina pectoris: Secondary | ICD-10-CM | POA: Diagnosis not present

## 2021-05-18 DIAGNOSIS — E871 Hypo-osmolality and hyponatremia: Secondary | ICD-10-CM | POA: Diagnosis not present

## 2021-05-18 DIAGNOSIS — Z0001 Encounter for general adult medical examination with abnormal findings: Secondary | ICD-10-CM | POA: Diagnosis not present

## 2021-05-18 DIAGNOSIS — I10 Essential (primary) hypertension: Secondary | ICD-10-CM | POA: Diagnosis not present

## 2021-05-18 DIAGNOSIS — I495 Sick sinus syndrome: Secondary | ICD-10-CM | POA: Diagnosis not present

## 2021-05-18 DIAGNOSIS — I700 Atherosclerosis of aorta: Secondary | ICD-10-CM | POA: Diagnosis not present

## 2021-05-22 DIAGNOSIS — R293 Abnormal posture: Secondary | ICD-10-CM | POA: Diagnosis not present

## 2021-05-22 DIAGNOSIS — R2689 Other abnormalities of gait and mobility: Secondary | ICD-10-CM | POA: Diagnosis not present

## 2021-05-22 DIAGNOSIS — Z9181 History of falling: Secondary | ICD-10-CM | POA: Diagnosis not present

## 2021-05-22 DIAGNOSIS — M6281 Muscle weakness (generalized): Secondary | ICD-10-CM | POA: Diagnosis not present

## 2021-05-22 DIAGNOSIS — Z20828 Contact with and (suspected) exposure to other viral communicable diseases: Secondary | ICD-10-CM | POA: Diagnosis not present

## 2021-05-22 DIAGNOSIS — R2681 Unsteadiness on feet: Secondary | ICD-10-CM | POA: Diagnosis not present

## 2021-05-25 DIAGNOSIS — Z9181 History of falling: Secondary | ICD-10-CM | POA: Diagnosis not present

## 2021-05-25 DIAGNOSIS — R293 Abnormal posture: Secondary | ICD-10-CM | POA: Diagnosis not present

## 2021-05-25 DIAGNOSIS — R2689 Other abnormalities of gait and mobility: Secondary | ICD-10-CM | POA: Diagnosis not present

## 2021-05-25 DIAGNOSIS — M6281 Muscle weakness (generalized): Secondary | ICD-10-CM | POA: Diagnosis not present

## 2021-05-25 DIAGNOSIS — R2681 Unsteadiness on feet: Secondary | ICD-10-CM | POA: Diagnosis not present

## 2021-05-29 DIAGNOSIS — R2681 Unsteadiness on feet: Secondary | ICD-10-CM | POA: Diagnosis not present

## 2021-05-29 DIAGNOSIS — R293 Abnormal posture: Secondary | ICD-10-CM | POA: Diagnosis not present

## 2021-05-29 DIAGNOSIS — R2689 Other abnormalities of gait and mobility: Secondary | ICD-10-CM | POA: Diagnosis not present

## 2021-05-29 DIAGNOSIS — Z9181 History of falling: Secondary | ICD-10-CM | POA: Diagnosis not present

## 2021-05-29 DIAGNOSIS — M6281 Muscle weakness (generalized): Secondary | ICD-10-CM | POA: Diagnosis not present

## 2021-06-01 DIAGNOSIS — Z9181 History of falling: Secondary | ICD-10-CM | POA: Diagnosis not present

## 2021-06-01 DIAGNOSIS — R2689 Other abnormalities of gait and mobility: Secondary | ICD-10-CM | POA: Diagnosis not present

## 2021-06-01 DIAGNOSIS — R2681 Unsteadiness on feet: Secondary | ICD-10-CM | POA: Diagnosis not present

## 2021-06-01 DIAGNOSIS — M6281 Muscle weakness (generalized): Secondary | ICD-10-CM | POA: Diagnosis not present

## 2021-06-01 DIAGNOSIS — R293 Abnormal posture: Secondary | ICD-10-CM | POA: Diagnosis not present

## 2021-06-03 DIAGNOSIS — I5032 Chronic diastolic (congestive) heart failure: Secondary | ICD-10-CM | POA: Diagnosis not present

## 2021-06-03 DIAGNOSIS — E039 Hypothyroidism, unspecified: Secondary | ICD-10-CM | POA: Diagnosis not present

## 2021-06-03 DIAGNOSIS — I110 Hypertensive heart disease with heart failure: Secondary | ICD-10-CM | POA: Diagnosis not present

## 2021-06-03 DIAGNOSIS — E7849 Other hyperlipidemia: Secondary | ICD-10-CM | POA: Diagnosis not present

## 2021-06-05 DIAGNOSIS — R2681 Unsteadiness on feet: Secondary | ICD-10-CM | POA: Diagnosis not present

## 2021-06-05 DIAGNOSIS — M6281 Muscle weakness (generalized): Secondary | ICD-10-CM | POA: Diagnosis not present

## 2021-06-05 DIAGNOSIS — R293 Abnormal posture: Secondary | ICD-10-CM | POA: Diagnosis not present

## 2021-06-05 DIAGNOSIS — R2689 Other abnormalities of gait and mobility: Secondary | ICD-10-CM | POA: Diagnosis not present

## 2021-06-05 DIAGNOSIS — Z9181 History of falling: Secondary | ICD-10-CM | POA: Diagnosis not present

## 2021-06-07 DIAGNOSIS — Z9181 History of falling: Secondary | ICD-10-CM | POA: Diagnosis not present

## 2021-06-07 DIAGNOSIS — R2689 Other abnormalities of gait and mobility: Secondary | ICD-10-CM | POA: Diagnosis not present

## 2021-06-07 DIAGNOSIS — M6281 Muscle weakness (generalized): Secondary | ICD-10-CM | POA: Diagnosis not present

## 2021-06-07 DIAGNOSIS — R2681 Unsteadiness on feet: Secondary | ICD-10-CM | POA: Diagnosis not present

## 2021-06-07 DIAGNOSIS — R293 Abnormal posture: Secondary | ICD-10-CM | POA: Diagnosis not present

## 2021-06-13 DIAGNOSIS — Z9181 History of falling: Secondary | ICD-10-CM | POA: Diagnosis not present

## 2021-06-13 DIAGNOSIS — R2689 Other abnormalities of gait and mobility: Secondary | ICD-10-CM | POA: Diagnosis not present

## 2021-06-13 DIAGNOSIS — R293 Abnormal posture: Secondary | ICD-10-CM | POA: Diagnosis not present

## 2021-06-13 DIAGNOSIS — R2681 Unsteadiness on feet: Secondary | ICD-10-CM | POA: Diagnosis not present

## 2021-06-13 DIAGNOSIS — M6281 Muscle weakness (generalized): Secondary | ICD-10-CM | POA: Diagnosis not present

## 2021-06-15 DIAGNOSIS — R2689 Other abnormalities of gait and mobility: Secondary | ICD-10-CM | POA: Diagnosis not present

## 2021-06-15 DIAGNOSIS — R293 Abnormal posture: Secondary | ICD-10-CM | POA: Diagnosis not present

## 2021-06-15 DIAGNOSIS — M6281 Muscle weakness (generalized): Secondary | ICD-10-CM | POA: Diagnosis not present

## 2021-06-15 DIAGNOSIS — Z9181 History of falling: Secondary | ICD-10-CM | POA: Diagnosis not present

## 2021-06-15 DIAGNOSIS — R2681 Unsteadiness on feet: Secondary | ICD-10-CM | POA: Diagnosis not present

## 2021-06-19 DIAGNOSIS — Z9181 History of falling: Secondary | ICD-10-CM | POA: Diagnosis not present

## 2021-06-19 DIAGNOSIS — R2681 Unsteadiness on feet: Secondary | ICD-10-CM | POA: Diagnosis not present

## 2021-06-19 DIAGNOSIS — R2689 Other abnormalities of gait and mobility: Secondary | ICD-10-CM | POA: Diagnosis not present

## 2021-06-19 DIAGNOSIS — R293 Abnormal posture: Secondary | ICD-10-CM | POA: Diagnosis not present

## 2021-06-19 DIAGNOSIS — M6281 Muscle weakness (generalized): Secondary | ICD-10-CM | POA: Diagnosis not present

## 2021-06-22 DIAGNOSIS — M6281 Muscle weakness (generalized): Secondary | ICD-10-CM | POA: Diagnosis not present

## 2021-06-22 DIAGNOSIS — Z9181 History of falling: Secondary | ICD-10-CM | POA: Diagnosis not present

## 2021-06-22 DIAGNOSIS — R2689 Other abnormalities of gait and mobility: Secondary | ICD-10-CM | POA: Diagnosis not present

## 2021-06-22 DIAGNOSIS — R293 Abnormal posture: Secondary | ICD-10-CM | POA: Diagnosis not present

## 2021-06-22 DIAGNOSIS — R2681 Unsteadiness on feet: Secondary | ICD-10-CM | POA: Diagnosis not present

## 2021-06-27 DIAGNOSIS — R293 Abnormal posture: Secondary | ICD-10-CM | POA: Diagnosis not present

## 2021-06-27 DIAGNOSIS — M6281 Muscle weakness (generalized): Secondary | ICD-10-CM | POA: Diagnosis not present

## 2021-06-27 DIAGNOSIS — Z9181 History of falling: Secondary | ICD-10-CM | POA: Diagnosis not present

## 2021-06-27 DIAGNOSIS — R2689 Other abnormalities of gait and mobility: Secondary | ICD-10-CM | POA: Diagnosis not present

## 2021-06-27 DIAGNOSIS — R2681 Unsteadiness on feet: Secondary | ICD-10-CM | POA: Diagnosis not present

## 2021-06-29 DIAGNOSIS — Z9181 History of falling: Secondary | ICD-10-CM | POA: Diagnosis not present

## 2021-06-29 DIAGNOSIS — R293 Abnormal posture: Secondary | ICD-10-CM | POA: Diagnosis not present

## 2021-06-29 DIAGNOSIS — R2689 Other abnormalities of gait and mobility: Secondary | ICD-10-CM | POA: Diagnosis not present

## 2021-06-29 DIAGNOSIS — R2681 Unsteadiness on feet: Secondary | ICD-10-CM | POA: Diagnosis not present

## 2021-06-29 DIAGNOSIS — M6281 Muscle weakness (generalized): Secondary | ICD-10-CM | POA: Diagnosis not present

## 2021-07-04 DIAGNOSIS — E039 Hypothyroidism, unspecified: Secondary | ICD-10-CM | POA: Diagnosis not present

## 2021-07-04 DIAGNOSIS — M6281 Muscle weakness (generalized): Secondary | ICD-10-CM | POA: Diagnosis not present

## 2021-07-04 DIAGNOSIS — E7849 Other hyperlipidemia: Secondary | ICD-10-CM | POA: Diagnosis not present

## 2021-07-04 DIAGNOSIS — R2681 Unsteadiness on feet: Secondary | ICD-10-CM | POA: Diagnosis not present

## 2021-07-04 DIAGNOSIS — I5032 Chronic diastolic (congestive) heart failure: Secondary | ICD-10-CM | POA: Diagnosis not present

## 2021-07-04 DIAGNOSIS — I110 Hypertensive heart disease with heart failure: Secondary | ICD-10-CM | POA: Diagnosis not present

## 2021-07-04 DIAGNOSIS — R2689 Other abnormalities of gait and mobility: Secondary | ICD-10-CM | POA: Diagnosis not present

## 2021-07-04 DIAGNOSIS — Z9181 History of falling: Secondary | ICD-10-CM | POA: Diagnosis not present

## 2021-07-04 DIAGNOSIS — R293 Abnormal posture: Secondary | ICD-10-CM | POA: Diagnosis not present

## 2021-07-06 DIAGNOSIS — Z9181 History of falling: Secondary | ICD-10-CM | POA: Diagnosis not present

## 2021-07-06 DIAGNOSIS — M6281 Muscle weakness (generalized): Secondary | ICD-10-CM | POA: Diagnosis not present

## 2021-07-06 DIAGNOSIS — R2689 Other abnormalities of gait and mobility: Secondary | ICD-10-CM | POA: Diagnosis not present

## 2021-07-06 DIAGNOSIS — R2681 Unsteadiness on feet: Secondary | ICD-10-CM | POA: Diagnosis not present

## 2021-07-06 DIAGNOSIS — R293 Abnormal posture: Secondary | ICD-10-CM | POA: Diagnosis not present

## 2021-07-11 DIAGNOSIS — M6281 Muscle weakness (generalized): Secondary | ICD-10-CM | POA: Diagnosis not present

## 2021-07-11 DIAGNOSIS — Z9181 History of falling: Secondary | ICD-10-CM | POA: Diagnosis not present

## 2021-07-11 DIAGNOSIS — R2681 Unsteadiness on feet: Secondary | ICD-10-CM | POA: Diagnosis not present

## 2021-07-11 DIAGNOSIS — R2689 Other abnormalities of gait and mobility: Secondary | ICD-10-CM | POA: Diagnosis not present

## 2021-07-11 DIAGNOSIS — R293 Abnormal posture: Secondary | ICD-10-CM | POA: Diagnosis not present

## 2021-07-13 DIAGNOSIS — R293 Abnormal posture: Secondary | ICD-10-CM | POA: Diagnosis not present

## 2021-07-13 DIAGNOSIS — Z9181 History of falling: Secondary | ICD-10-CM | POA: Diagnosis not present

## 2021-07-13 DIAGNOSIS — R2689 Other abnormalities of gait and mobility: Secondary | ICD-10-CM | POA: Diagnosis not present

## 2021-07-13 DIAGNOSIS — R2681 Unsteadiness on feet: Secondary | ICD-10-CM | POA: Diagnosis not present

## 2021-07-13 DIAGNOSIS — M6281 Muscle weakness (generalized): Secondary | ICD-10-CM | POA: Diagnosis not present

## 2021-07-17 DIAGNOSIS — R2681 Unsteadiness on feet: Secondary | ICD-10-CM | POA: Diagnosis not present

## 2021-07-17 DIAGNOSIS — Z9181 History of falling: Secondary | ICD-10-CM | POA: Diagnosis not present

## 2021-07-17 DIAGNOSIS — M6281 Muscle weakness (generalized): Secondary | ICD-10-CM | POA: Diagnosis not present

## 2021-07-17 DIAGNOSIS — R293 Abnormal posture: Secondary | ICD-10-CM | POA: Diagnosis not present

## 2021-07-17 DIAGNOSIS — R2689 Other abnormalities of gait and mobility: Secondary | ICD-10-CM | POA: Diagnosis not present

## 2021-07-19 DIAGNOSIS — R2681 Unsteadiness on feet: Secondary | ICD-10-CM | POA: Diagnosis not present

## 2021-07-19 DIAGNOSIS — R2689 Other abnormalities of gait and mobility: Secondary | ICD-10-CM | POA: Diagnosis not present

## 2021-07-19 DIAGNOSIS — R293 Abnormal posture: Secondary | ICD-10-CM | POA: Diagnosis not present

## 2021-07-19 DIAGNOSIS — M6281 Muscle weakness (generalized): Secondary | ICD-10-CM | POA: Diagnosis not present

## 2021-07-19 DIAGNOSIS — Z9181 History of falling: Secondary | ICD-10-CM | POA: Diagnosis not present

## 2021-07-24 DIAGNOSIS — R2689 Other abnormalities of gait and mobility: Secondary | ICD-10-CM | POA: Diagnosis not present

## 2021-07-24 DIAGNOSIS — R2681 Unsteadiness on feet: Secondary | ICD-10-CM | POA: Diagnosis not present

## 2021-07-24 DIAGNOSIS — M6281 Muscle weakness (generalized): Secondary | ICD-10-CM | POA: Diagnosis not present

## 2021-07-24 DIAGNOSIS — Z9181 History of falling: Secondary | ICD-10-CM | POA: Diagnosis not present

## 2021-07-24 DIAGNOSIS — R293 Abnormal posture: Secondary | ICD-10-CM | POA: Diagnosis not present

## 2021-07-25 LAB — CUP PACEART REMOTE DEVICE CHECK
Battery Remaining Percentage: 60 %
Brady Statistic RA Percent Paced: 83 %
Brady Statistic RV Percent Paced: 0 %
Date Time Interrogation Session: 20230621144143
Implantable Lead Implant Date: 20180626
Implantable Lead Implant Date: 20180626
Implantable Lead Location: 753859
Implantable Lead Location: 753860
Implantable Lead Model: 377
Implantable Lead Model: 377
Implantable Lead Serial Number: 49791993
Implantable Lead Serial Number: 49865965
Implantable Pulse Generator Implant Date: 20180626
Lead Channel Impedance Value: 449 Ohm
Lead Channel Impedance Value: 449 Ohm
Lead Channel Pacing Threshold Amplitude: 0.6 V
Lead Channel Pacing Threshold Amplitude: 1.1 V
Lead Channel Pacing Threshold Pulse Width: 0.4 ms
Lead Channel Pacing Threshold Pulse Width: 0.4 ms
Lead Channel Sensing Intrinsic Amplitude: 10.1 mV
Lead Channel Sensing Intrinsic Amplitude: 2.5 mV
Lead Channel Setting Pacing Amplitude: 2 V
Lead Channel Setting Pacing Amplitude: 2.4 V
Lead Channel Setting Pacing Pulse Width: 0.4 ms
Pulse Gen Model: 407145
Pulse Gen Serial Number: 69052081

## 2021-07-26 ENCOUNTER — Ambulatory Visit (INDEPENDENT_AMBULATORY_CARE_PROVIDER_SITE_OTHER): Payer: Medicare Other

## 2021-07-26 DIAGNOSIS — I495 Sick sinus syndrome: Secondary | ICD-10-CM

## 2021-07-26 DIAGNOSIS — R293 Abnormal posture: Secondary | ICD-10-CM | POA: Diagnosis not present

## 2021-07-26 DIAGNOSIS — Z9181 History of falling: Secondary | ICD-10-CM | POA: Diagnosis not present

## 2021-07-26 DIAGNOSIS — M6281 Muscle weakness (generalized): Secondary | ICD-10-CM | POA: Diagnosis not present

## 2021-07-26 DIAGNOSIS — R2689 Other abnormalities of gait and mobility: Secondary | ICD-10-CM | POA: Diagnosis not present

## 2021-07-26 DIAGNOSIS — R2681 Unsteadiness on feet: Secondary | ICD-10-CM | POA: Diagnosis not present

## 2021-07-31 DIAGNOSIS — M6281 Muscle weakness (generalized): Secondary | ICD-10-CM | POA: Diagnosis not present

## 2021-07-31 DIAGNOSIS — Z9181 History of falling: Secondary | ICD-10-CM | POA: Diagnosis not present

## 2021-07-31 DIAGNOSIS — R2689 Other abnormalities of gait and mobility: Secondary | ICD-10-CM | POA: Diagnosis not present

## 2021-07-31 DIAGNOSIS — R293 Abnormal posture: Secondary | ICD-10-CM | POA: Diagnosis not present

## 2021-07-31 DIAGNOSIS — R2681 Unsteadiness on feet: Secondary | ICD-10-CM | POA: Diagnosis not present

## 2021-08-01 ENCOUNTER — Encounter: Payer: Self-pay | Admitting: Internal Medicine

## 2021-08-01 ENCOUNTER — Ambulatory Visit (INDEPENDENT_AMBULATORY_CARE_PROVIDER_SITE_OTHER): Payer: Medicare Other | Admitting: Internal Medicine

## 2021-08-01 VITALS — BP 138/78 | HR 76 | Ht 59.0 in | Wt 146.0 lb

## 2021-08-01 DIAGNOSIS — I495 Sick sinus syndrome: Secondary | ICD-10-CM | POA: Diagnosis not present

## 2021-08-01 DIAGNOSIS — I25119 Atherosclerotic heart disease of native coronary artery with unspecified angina pectoris: Secondary | ICD-10-CM | POA: Diagnosis not present

## 2021-08-01 NOTE — Progress Notes (Signed)
HPI Amy [name] returns today for followup. She is a pleasant 76 yo woman with a h/o sinus node dysfunction, pAF, s/p PPM insertion. She notes that her bp has improved. She denies chest pain. She is s/p PCI/Stent over 5 years ago. She has minimal leg swelling. She has not lost weight despite exercise. She notes that her bp has been elevated since Covid.  Allergies  Allergen Reactions   Sulfa Antibiotics Rash     Current Outpatient Medications  Medication Sig Dispense Refill   acetaminophen (TYLENOL) 650 MG CR tablet Take 650 mg by mouth daily as needed for pain.     aspirin 81 MG tablet Take 81 mg by mouth daily.     atorvastatin (LIPITOR) 20 MG tablet Take 20 mg by mouth at bedtime.   3   Biotin 5 MG CAPS Take 5 mg by mouth daily at 12 noon.      Cholecalciferol (VITAMIN D3) 2000 units capsule Take 2,000 Units by mouth daily at 12 noon.      clobetasol (TEMOVATE) 0.05 % external solution Apply 1 application topically daily as needed (psoriasis).      clopidogrel (PLAVIX) 75 MG tablet Take 1 tablet (75 mg total) by mouth daily. 90 tablet 3   fexofenadine (ALLEGRA) 180 MG tablet Take 180 mg by mouth at bedtime.     hydrochlorothiazide (MICROZIDE) 12.5 MG capsule Take 12.5 mg by mouth daily.     levothyroxine (SYNTHROID) 75 MCG tablet Take 75 mcg by mouth every morning.     lisinopril (ZESTRIL) 5 MG tablet Take 5 mg by mouth 2 (two) times daily.     Multiple Vitamin (MULTIVITAMIN WITH MINERALS) TABS tablet Take 1 tablet by mouth daily at 12 noon.      triamcinolone cream (KENALOG) 0.1 % Apply 1 application topically daily as needed (psoriasis). Mixed with nystatin cream 1:1     No current facility-administered medications for this visit.     Past Medical History:  Diagnosis Date   Asthma    CAD (coronary artery disease)    60-70% proximal LAD/diagonal bifurcational disease October 2015 at [facility] status DES x 2 to LAD/diagonal October 2015   Essential hypertension     Hyperlipidemia    Hypothyroidism    Sinus node dysfunction ([place])    Biotronik pacemaker in place    ROS:   All systems reviewed and negative except as noted in the HPI.   Past Surgical History:  Procedure Laterality Date   ABDOMINAL HYSTERECTOMY     ANAL FISSURE REPAIR     CATARACT EXTRACTION W/PHACO Right 10/17/2014   Procedure: CATARACT EXTRACTION PHACO AND INTRAOCULAR LENS PLACEMENT ([place]);  Surgeon: Tonny Branch, MD;  Location: [facility];  Service: Ophthalmology;  Laterality: Right;  CDE: 4.88   CATARACT EXTRACTION W/PHACO Left 10/27/2014   Procedure: CATARACT EXTRACTION PHACO AND INTRAOCULAR LENS PLACEMENT ([facility]);  Surgeon: Tonny Branch, MD;  Location: [facility];  Service: Ophthalmology;  Laterality: Left;  CDE:7.86   COLONOSCOPY N/A 04/16/2018   Procedure: COLONOSCOPY;  Surgeon: Rogene Houston, MD;  Location: [facility];  Service: Endoscopy;  Laterality: N/A;  [place]     NASAL SINUS SURGERY     PACEMAKER IMPLANT N/A 07/30/2016   Procedure: Pacemaker Implant Dual Chamber;  Surgeon: Evans Lance, MD;  Location: [place] CV LAB;  Service: Cardiovascular;  Laterality: N/A;   TONSILLECTOMY       Family History  Problem Relation Age of  Onset   Heart disease Father      Social History   Socioeconomic History   Marital status: Widowed    Spouse name: Not on file   Number of children: Not on file   Years of education: Not on file   Highest education level: Not on file  Occupational History   Not on file  Tobacco Use   Smoking status: Never   Smokeless tobacco: Never  Vaping Use   Vaping Use: Never used  Substance and Sexual Activity   Alcohol use: No    Alcohol/week: 0.0 standard drinks of alcohol   Drug use: No   Sexual activity: Never  Other Topics Concern   Not on file  Social History Narrative   Not on file   Social Determinants of Health   Financial Resource Strain: Not on file  Food Insecurity: Not on file  Transportation Needs: Not on file   Physical Activity: Not on file  Stress: Not on file  Social Connections: Not on file  Intimate Partner Violence: Not on file     BP 138/78   Pulse 76   Ht '4\' 11"'$  (1.499 m)   Wt 146 lb (66.2 kg)   SpO2 98%   BMI 29.49 kg/m   Physical Exam:  Obese but well appearing NAD HEENT: Unremarkable Neck:  6 cm JVD, no thyromegally Lymphatics:  No adenopathy Back:  No CVA tenderness Lungs:  Clear with no wheezes HEART:  Regular rate rhythm, no murmurs, no rubs, no clicks Abd:  soft, positive bowel sounds, no organomegally, no rebound, no guarding Ext:  2 plus pulses, no edema, no cyanosis, no clubbing Skin:  No rashes no nodules Neuro:  CN II through XII intact, motor grossly intact  DEVICE  Normal device function.  See PaceArt for details.   Assess/Plan:   1.Sinus node dysfunction - she is asymptomatic, s/p PPM insertion. 2. PPM - her biotronik DDD PPM is working normally. We will recheck in several months. 3. HTN - her bp is minimally elevated but better at home. 4. Dyslipidemia - she has tolerated her statin therapy with no difficulty.   Mikle Bosworth.D.

## 2021-08-01 NOTE — Patient Instructions (Signed)
Medication Instructions:  Your physician recommends that you continue on your current medications as directed. Please refer to the Current Medication list given to you today.   Labwork: None  Testing/Procedures: None  Follow-Up: Follow up with Dr. Lovena Le in 1 year.    Any Other Special Instructions Will Be Listed Below (If Applicable).     If you need a refill on your cardiac medications before your next appointment, please call your pharmacy.

## 2021-08-02 DIAGNOSIS — Z9181 History of falling: Secondary | ICD-10-CM | POA: Diagnosis not present

## 2021-08-02 DIAGNOSIS — M6281 Muscle weakness (generalized): Secondary | ICD-10-CM | POA: Diagnosis not present

## 2021-08-02 DIAGNOSIS — R2681 Unsteadiness on feet: Secondary | ICD-10-CM | POA: Diagnosis not present

## 2021-08-02 DIAGNOSIS — R293 Abnormal posture: Secondary | ICD-10-CM | POA: Diagnosis not present

## 2021-08-02 DIAGNOSIS — R2689 Other abnormalities of gait and mobility: Secondary | ICD-10-CM | POA: Diagnosis not present

## 2021-08-03 DIAGNOSIS — I110 Hypertensive heart disease with heart failure: Secondary | ICD-10-CM | POA: Diagnosis not present

## 2021-08-03 DIAGNOSIS — E039 Hypothyroidism, unspecified: Secondary | ICD-10-CM | POA: Diagnosis not present

## 2021-08-03 DIAGNOSIS — E7849 Other hyperlipidemia: Secondary | ICD-10-CM | POA: Diagnosis not present

## 2021-08-03 DIAGNOSIS — I5032 Chronic diastolic (congestive) heart failure: Secondary | ICD-10-CM | POA: Diagnosis not present

## 2021-08-03 NOTE — Progress Notes (Signed)
Remote pacemaker transmission.   

## 2021-08-13 DIAGNOSIS — Z9181 History of falling: Secondary | ICD-10-CM | POA: Diagnosis not present

## 2021-08-13 DIAGNOSIS — M6281 Muscle weakness (generalized): Secondary | ICD-10-CM | POA: Diagnosis not present

## 2021-08-13 DIAGNOSIS — R2689 Other abnormalities of gait and mobility: Secondary | ICD-10-CM | POA: Diagnosis not present

## 2021-08-13 DIAGNOSIS — R2681 Unsteadiness on feet: Secondary | ICD-10-CM | POA: Diagnosis not present

## 2021-08-13 DIAGNOSIS — R293 Abnormal posture: Secondary | ICD-10-CM | POA: Diagnosis not present

## 2021-08-15 DIAGNOSIS — R293 Abnormal posture: Secondary | ICD-10-CM | POA: Diagnosis not present

## 2021-08-15 DIAGNOSIS — L280 Lichen simplex chronicus: Secondary | ICD-10-CM | POA: Diagnosis not present

## 2021-08-15 DIAGNOSIS — R2689 Other abnormalities of gait and mobility: Secondary | ICD-10-CM | POA: Diagnosis not present

## 2021-08-15 DIAGNOSIS — M6281 Muscle weakness (generalized): Secondary | ICD-10-CM | POA: Diagnosis not present

## 2021-08-15 DIAGNOSIS — R2681 Unsteadiness on feet: Secondary | ICD-10-CM | POA: Diagnosis not present

## 2021-08-15 DIAGNOSIS — Z9181 History of falling: Secondary | ICD-10-CM | POA: Diagnosis not present

## 2021-08-21 DIAGNOSIS — M6281 Muscle weakness (generalized): Secondary | ICD-10-CM | POA: Diagnosis not present

## 2021-08-21 DIAGNOSIS — R293 Abnormal posture: Secondary | ICD-10-CM | POA: Diagnosis not present

## 2021-08-21 DIAGNOSIS — R2681 Unsteadiness on feet: Secondary | ICD-10-CM | POA: Diagnosis not present

## 2021-08-21 DIAGNOSIS — R2689 Other abnormalities of gait and mobility: Secondary | ICD-10-CM | POA: Diagnosis not present

## 2021-08-21 DIAGNOSIS — Z9181 History of falling: Secondary | ICD-10-CM | POA: Diagnosis not present

## 2021-08-23 DIAGNOSIS — R2689 Other abnormalities of gait and mobility: Secondary | ICD-10-CM | POA: Diagnosis not present

## 2021-08-23 DIAGNOSIS — R293 Abnormal posture: Secondary | ICD-10-CM | POA: Diagnosis not present

## 2021-08-23 DIAGNOSIS — M6281 Muscle weakness (generalized): Secondary | ICD-10-CM | POA: Diagnosis not present

## 2021-08-23 DIAGNOSIS — Z9181 History of falling: Secondary | ICD-10-CM | POA: Diagnosis not present

## 2021-08-23 DIAGNOSIS — R2681 Unsteadiness on feet: Secondary | ICD-10-CM | POA: Diagnosis not present

## 2021-08-28 DIAGNOSIS — R2681 Unsteadiness on feet: Secondary | ICD-10-CM | POA: Diagnosis not present

## 2021-08-28 DIAGNOSIS — R293 Abnormal posture: Secondary | ICD-10-CM | POA: Diagnosis not present

## 2021-08-28 DIAGNOSIS — Z9181 History of falling: Secondary | ICD-10-CM | POA: Diagnosis not present

## 2021-08-28 DIAGNOSIS — R2689 Other abnormalities of gait and mobility: Secondary | ICD-10-CM | POA: Diagnosis not present

## 2021-08-28 DIAGNOSIS — M6281 Muscle weakness (generalized): Secondary | ICD-10-CM | POA: Diagnosis not present

## 2021-08-30 DIAGNOSIS — R2689 Other abnormalities of gait and mobility: Secondary | ICD-10-CM | POA: Diagnosis not present

## 2021-08-30 DIAGNOSIS — R293 Abnormal posture: Secondary | ICD-10-CM | POA: Diagnosis not present

## 2021-08-30 DIAGNOSIS — Z9181 History of falling: Secondary | ICD-10-CM | POA: Diagnosis not present

## 2021-08-30 DIAGNOSIS — M6281 Muscle weakness (generalized): Secondary | ICD-10-CM | POA: Diagnosis not present

## 2021-08-30 DIAGNOSIS — R2681 Unsteadiness on feet: Secondary | ICD-10-CM | POA: Diagnosis not present

## 2021-09-03 DIAGNOSIS — I5032 Chronic diastolic (congestive) heart failure: Secondary | ICD-10-CM | POA: Diagnosis not present

## 2021-09-03 DIAGNOSIS — E7849 Other hyperlipidemia: Secondary | ICD-10-CM | POA: Diagnosis not present

## 2021-09-03 DIAGNOSIS — I110 Hypertensive heart disease with heart failure: Secondary | ICD-10-CM | POA: Diagnosis not present

## 2021-09-03 DIAGNOSIS — E039 Hypothyroidism, unspecified: Secondary | ICD-10-CM | POA: Diagnosis not present

## 2021-09-04 DIAGNOSIS — M6281 Muscle weakness (generalized): Secondary | ICD-10-CM | POA: Diagnosis not present

## 2021-09-04 DIAGNOSIS — Z9181 History of falling: Secondary | ICD-10-CM | POA: Diagnosis not present

## 2021-09-04 DIAGNOSIS — R2689 Other abnormalities of gait and mobility: Secondary | ICD-10-CM | POA: Diagnosis not present

## 2021-09-04 DIAGNOSIS — R2681 Unsteadiness on feet: Secondary | ICD-10-CM | POA: Diagnosis not present

## 2021-09-04 DIAGNOSIS — R293 Abnormal posture: Secondary | ICD-10-CM | POA: Diagnosis not present

## 2021-09-06 DIAGNOSIS — R2689 Other abnormalities of gait and mobility: Secondary | ICD-10-CM | POA: Diagnosis not present

## 2021-09-06 DIAGNOSIS — R2681 Unsteadiness on feet: Secondary | ICD-10-CM | POA: Diagnosis not present

## 2021-09-06 DIAGNOSIS — R293 Abnormal posture: Secondary | ICD-10-CM | POA: Diagnosis not present

## 2021-09-06 DIAGNOSIS — Z9181 History of falling: Secondary | ICD-10-CM | POA: Diagnosis not present

## 2021-09-06 DIAGNOSIS — M6281 Muscle weakness (generalized): Secondary | ICD-10-CM | POA: Diagnosis not present

## 2021-10-25 ENCOUNTER — Ambulatory Visit (INDEPENDENT_AMBULATORY_CARE_PROVIDER_SITE_OTHER): Payer: Medicare Other

## 2021-10-25 DIAGNOSIS — I495 Sick sinus syndrome: Secondary | ICD-10-CM

## 2021-10-25 LAB — CUP PACEART REMOTE DEVICE CHECK
Date Time Interrogation Session: 20230921083859
Implantable Lead Implant Date: 20180626
Implantable Lead Implant Date: 20180626
Implantable Lead Location: 753859
Implantable Lead Location: 753860
Implantable Lead Model: 377
Implantable Lead Model: 377
Implantable Lead Serial Number: 49791993
Implantable Lead Serial Number: 49865965
Implantable Pulse Generator Implant Date: 20180626
Pulse Gen Model: 407145
Pulse Gen Serial Number: 69052081

## 2021-11-05 NOTE — Progress Notes (Signed)
Remote pacemaker transmission.   

## 2021-11-11 NOTE — Progress Notes (Signed)
Cardiology Office Note  Date: 11/12/2021   ID: Tiniya, Benke 07-Jan-1946, MRN [id number]  PCP:  Juliette Alcide, MD  Cardiologist:  Nona Dell, MD Electrophysiologist:  Lewayne Bunting, MD   Chief Complaint  Patient presents with   Cardiac follow-up    History of Present Illness: Amy [name] is a 76 y.o. female last seen in April.  She is here today for a routine visit.  Reports no definite angina, but has felt more short of breath with activity, states that she has significant diaphoresis with exertion that is more noticeable as well.  No palpitations or syncope.  She still manages to walk for about 20 minutes, 4 to 5 days a week on level ground for activity.  Biotronik pacemaker in place with follow-up by Dr. Ladona Ridgel.  Recent device interrogation indicated normal function.  I personally reviewed her ECG today which shows an atrial paced rhythm with increased voltage.  We went over her medications today.  Also discussed updating ischemic and cardiac structural testing.  Past Medical History:  Diagnosis Date   Asthma    CAD (coronary artery disease)    60-70% proximal LAD/diagonal bifurcational disease October 2015 at [facility] status DES x 2 to LAD/diagonal October 2015   Essential hypertension    Hyperlipidemia    Hypothyroidism    Sinus node dysfunction (H[facility])    Biotronik pacemaker in place    Past Surgical History:  Procedure Laterality Date   ABDOMINAL HYSTERECTOMY     ANAL FISSURE REPAIR     CATARACT EXTRACTION W/PHACO Right 10/17/2014   Procedure: CATARACT EXTRACTION PHACO AND INTRAOCULAR LENS PLACEMENT ([facility]);  Surgeon: Gemma Payor, MD;  Location: [facility];  Service: Ophthalmology;  Laterality: Right;  CDE: 4.88   CATARACT EXTRACTION W/PHACO Left 10/27/2014   Procedure: CATARACT EXTRACTION PHACO AND INTRAOCULAR LENS PLACEMENT ([facility]);  Surgeon: Gemma Payor, MD;  Location: [facility];  Service: Ophthalmology;  Laterality: Left;  CDE:7.86   COLONOSCOPY N/A 04/16/2018    Procedure: COLONOSCOPY;  Surgeon: Malissa Hippo, MD;  Location: [facility];  Service: Endoscopy;  Laterality: N/A;  830   FINGER SURGERY     NASAL SINUS SURGERY     PACEMAKER IMPLANT N/A 07/30/2016   Procedure: Pacemaker Implant Dual Chamber;  Surgeon: Marinus Maw, MD;  Location: [facility] INVASIVE CV LAB;  Service: Cardiovascular;  Laterality: N/A;   TONSILLECTOMY      Current Outpatient Medications  Medication Sig Dispense Refill   acetaminophen (TYLENOL) 650 MG CR tablet Take 650 mg by mouth daily as needed for pain.     aspirin 81 MG tablet Take 81 mg by mouth daily.     atorvastatin (LIPITOR) 20 MG tablet Take 20 mg by mouth at bedtime.   3   Biotin 5 MG CAPS Take 5 mg by mouth daily at 12 noon.      Cholecalciferol (VITAMIN D3) 2000 units capsule Take 2,000 Units by mouth daily at 12 noon.      clobetasol (TEMOVATE) 0.05 % external solution Apply 1 application topically daily as needed (psoriasis).      clopidogrel (PLAVIX) 75 MG tablet Take 1 tablet (75 mg total) by mouth daily. 90 tablet 3   hydrochlorothiazide (MICROZIDE) 12.5 MG capsule Take 12.5 mg by mouth daily.     levocetirizine (XYZAL) 5 MG tablet Take 5 mg by mouth as needed for allergies.     levothyroxine (SYNTHROID) 75 MCG tablet Take 75 mcg by mouth every morning.  lisinopril (ZESTRIL) 5 MG tablet Take 5 mg by mouth 2 (two) times daily.     Multiple Vitamin (MULTIVITAMIN WITH MINERALS) TABS tablet Take 1 tablet by mouth daily at 12 noon.      triamcinolone cream (KENALOG) 0.1 % Apply 1 application topically daily as needed (psoriasis). Mixed with nystatin cream 1:1     No current facility-administered medications for this visit.   Allergies:  Sulfa antibiotics   ROS: No palpitations or syncope.  Physical Exam: VS:  BP (!) 144/68   Pulse 64   Ht 4\' 11"  (1.499 m)   Wt 147 lb (66.7 kg)   SpO2 100%   BMI 29.69 kg/m , BMI Body mass index is 29.69 kg/m.  Wt Readings from Last 3 Encounters:  11/12/21 147 lb  (66.7 kg)  08/01/21 146 lb (66.2 kg)  05/10/21 143 lb 6.4 oz (65 kg)    General: Patient appears comfortable at rest. HEENT: Conjunctiva and lids normal. Neck: Supple, no elevated JVP or carotid bruits. Lungs: Clear to auscultation, nonlabored breathing at rest. Cardiac: Regular rate and rhythm, no S3, 2/6 systolic murmur. Abdomen: Soft, nontender, no hepatomegaly, bowel sounds present. Extremities: No pitting edema.  ECG:  An ECG dated 11/27/2020 was personally reviewed today and demonstrated:  Atrial paced rhythm.  Recent Labwork:  October 2022: TSH 2.64, hemoglobin 13.3, platelets 183, cholesterol 153, triglycerides 53, HDL 78, LDL 64  Other Studies Reviewed Today:  Lexiscan Myoview 05/29/2020: No diagnostic ST segment changes to indicate ischemia. Rare PACs and PVCs noted. Small, mild intensity, apical anterior defect that is fixed and most consistent with soft tissue attenuation. No ischemic territories. This is a low risk study. Nuclear stress EF: 64%.  Assessment and Plan:  1.  Dyspnea on exertion and exertional diaphoresis as discussed above.  We will plan to update Lexiscan Myoview to reassess ischemic burden, also echocardiogram to evaluate LVEF and aortic stenosis.  2.  CAD status post DES x2 to the LAD/diagonal bifurcation in 2015.  Update ischemic testing as discussed above.  Continue aspirin, Plavix, Lipitor, and lisinopril.  3.  Aortic stenosis, mild by assessment in 2021.  We will obtain a follow-up echocardiogram for surveillance in light of exertional symptoms.  4.  Sinus node dysfunction, Biotronik pacemaker in place with follow-up by Dr. Ladona Ridgel.  Medication Adjustments/Labs and Tests Ordered: Current medicines are reviewed at length with the patient today.  Concerns regarding medicines are outlined above.   Tests Ordered: Orders Placed This Encounter  Procedures   NM Myocar Multi W/Spect W/Wall Motion / EF   EKG 12-Lead   ECHOCARDIOGRAM COMPLETE     Medication Changes: No orders of the defined types were placed in this encounter.   Disposition:  Follow up  6 months.  Signed, Jonelle Sidle, MD, [facility] 11/12/2021 10:19 AM    [facility] at [facility] [address] [place], [place], [place] [address] Phone: [phone]; Fax: [phone]

## 2021-11-12 ENCOUNTER — Telehealth: Payer: Self-pay | Admitting: Cardiology

## 2021-11-12 ENCOUNTER — Encounter: Payer: Self-pay | Admitting: Cardiology

## 2021-11-12 ENCOUNTER — Ambulatory Visit: Payer: Medicare Other | Attending: Cardiology | Admitting: Cardiology

## 2021-11-12 VITALS — BP 144/68 | HR 64 | Ht 59.0 in | Wt 147.0 lb

## 2021-11-12 DIAGNOSIS — R0609 Other forms of dyspnea: Secondary | ICD-10-CM | POA: Diagnosis not present

## 2021-11-12 DIAGNOSIS — I25119 Atherosclerotic heart disease of native coronary artery with unspecified angina pectoris: Secondary | ICD-10-CM | POA: Insufficient documentation

## 2021-11-12 DIAGNOSIS — I495 Sick sinus syndrome: Secondary | ICD-10-CM | POA: Diagnosis not present

## 2021-11-12 DIAGNOSIS — I350 Nonrheumatic aortic (valve) stenosis: Secondary | ICD-10-CM | POA: Diagnosis not present

## 2021-11-12 NOTE — Patient Instructions (Addendum)
Medication Instructions:  Your physician recommends that you continue on your current medications as directed. Please refer to the Current Medication list given to you today.   Labwork: none  Testing/Procedures: Your physician has requested that you have a lexiscan myoview. For further information please visit [email or web address]. Please follow instruction sheet, as given.  Your physician has requested that you have an echocardiogram. Echocardiography is a painless test that uses sound waves to create images of your heart. It provides your doctor with information about the size and shape of your heart and how well your heart's chambers and valves are working. This procedure takes approximately one hour. There are no restrictions for this procedure.   Follow-Up:  Your physician recommends that you schedule a follow-up appointment in: 6 months  Any Other Special Instructions Will Be Listed Below (If Applicable).  If you need a refill on your cardiac medications before your next appointment, please call your pharmacy.

## 2021-11-12 NOTE — Telephone Encounter (Signed)
Checking percert on the following patient for testing scheduled at [facility].     LEXISCAN ECHO                  11/20/2021

## 2021-11-16 DIAGNOSIS — E876 Hypokalemia: Secondary | ICD-10-CM | POA: Diagnosis not present

## 2021-11-16 DIAGNOSIS — R5383 Other fatigue: Secondary | ICD-10-CM | POA: Diagnosis not present

## 2021-11-16 DIAGNOSIS — I5032 Chronic diastolic (congestive) heart failure: Secondary | ICD-10-CM | POA: Diagnosis not present

## 2021-11-16 DIAGNOSIS — Z1159 Encounter for screening for other viral diseases: Secondary | ICD-10-CM | POA: Diagnosis not present

## 2021-11-16 DIAGNOSIS — E782 Mixed hyperlipidemia: Secondary | ICD-10-CM | POA: Diagnosis not present

## 2021-11-16 DIAGNOSIS — E559 Vitamin D deficiency, unspecified: Secondary | ICD-10-CM | POA: Diagnosis not present

## 2021-11-16 DIAGNOSIS — E7849 Other hyperlipidemia: Secondary | ICD-10-CM | POA: Diagnosis not present

## 2021-11-16 DIAGNOSIS — I10 Essential (primary) hypertension: Secondary | ICD-10-CM | POA: Diagnosis not present

## 2021-11-16 DIAGNOSIS — E039 Hypothyroidism, unspecified: Secondary | ICD-10-CM | POA: Diagnosis not present

## 2021-11-16 DIAGNOSIS — K219 Gastro-esophageal reflux disease without esophagitis: Secondary | ICD-10-CM | POA: Diagnosis not present

## 2021-11-19 DIAGNOSIS — Z950 Presence of cardiac pacemaker: Secondary | ICD-10-CM | POA: Diagnosis not present

## 2021-11-19 DIAGNOSIS — I495 Sick sinus syndrome: Secondary | ICD-10-CM | POA: Diagnosis not present

## 2021-11-19 DIAGNOSIS — E871 Hypo-osmolality and hyponatremia: Secondary | ICD-10-CM | POA: Diagnosis not present

## 2021-11-19 DIAGNOSIS — F411 Generalized anxiety disorder: Secondary | ICD-10-CM | POA: Diagnosis not present

## 2021-11-19 DIAGNOSIS — I10 Essential (primary) hypertension: Secondary | ICD-10-CM | POA: Diagnosis not present

## 2021-11-19 DIAGNOSIS — I700 Atherosclerosis of aorta: Secondary | ICD-10-CM | POA: Diagnosis not present

## 2021-11-19 DIAGNOSIS — Z23 Encounter for immunization: Secondary | ICD-10-CM | POA: Diagnosis not present

## 2021-11-19 DIAGNOSIS — E039 Hypothyroidism, unspecified: Secondary | ICD-10-CM | POA: Diagnosis not present

## 2021-11-19 DIAGNOSIS — I2510 Atherosclerotic heart disease of native coronary artery without angina pectoris: Secondary | ICD-10-CM | POA: Diagnosis not present

## 2021-11-19 DIAGNOSIS — Z6828 Body mass index (BMI) 28.0-28.9, adult: Secondary | ICD-10-CM | POA: Diagnosis not present

## 2021-11-19 DIAGNOSIS — R2681 Unsteadiness on feet: Secondary | ICD-10-CM | POA: Diagnosis not present

## 2021-11-19 DIAGNOSIS — E7849 Other hyperlipidemia: Secondary | ICD-10-CM | POA: Diagnosis not present

## 2021-11-20 ENCOUNTER — Ambulatory Visit (HOSPITAL_COMMUNITY)
Admission: RE | Admit: 2021-11-20 | Discharge: 2021-11-20 | Disposition: A | Discharge status: Home / Self Care | Payer: Medicare Other | Source: Ambulatory Visit | Attending: Cardiology | Admitting: Cardiology

## 2021-11-20 ENCOUNTER — Encounter (HOSPITAL_BASED_OUTPATIENT_CLINIC_OR_DEPARTMENT_OTHER)
Admission: RE | Admit: 2021-11-20 | Discharge: 2021-11-20 | Disposition: A | Discharge status: Home / Self Care | Payer: Medicare Other | Source: Ambulatory Visit | Attending: Cardiology | Admitting: Cardiology

## 2021-11-20 DIAGNOSIS — I25119 Atherosclerotic heart disease of native coronary artery with unspecified angina pectoris: Secondary | ICD-10-CM

## 2021-11-20 DIAGNOSIS — I350 Nonrheumatic aortic (valve) stenosis: Secondary | ICD-10-CM | POA: Insufficient documentation

## 2021-11-20 LAB — NM MYOCAR MULTI W/SPECT W/WALL MOTION / EF
LV dias vol: 58 mL (ref 46–106)
LV sys vol: 18 mL
Nuc Stress EF: 69 %
Peak HR: 93 {beats}/min
RATE: 0.3
Rest HR: 70 {beats}/min
Rest Nuclear Isotope Dose: 10.1 mCi
SDS: 2
SRS: 1
SSS: 3
ST Depression (mm): 0 mm
Stress Nuclear Isotope Dose: 30 mCi
TID: 1.14

## 2021-11-20 LAB — ECHOCARDIOGRAM COMPLETE
AR max vel: 1.02 cm2
AV Area VTI: 1.05 cm2
AV Area mean vel: 0.98 cm2
AV Mean grad: 8 mmHg
AV Peak grad: 15.3 mmHg
Ao pk vel: 1.96 m/s
Area-P 1/2: 2.5 cm2
S' Lateral: 2.3 cm

## 2021-11-20 MED ORDER — REGADENOSON 0.4 MG/5ML IV SOLN
INTRAVENOUS | Status: AC
  Administered 2021-11-20: 0.4 mg via INTRAVENOUS
  Filled 2021-11-20: qty 5

## 2021-11-20 MED ORDER — TECHNETIUM TC 99M TETROFOSMIN IV KIT
10.0000 | PACK | Freq: Once | INTRAVENOUS | Status: AC | PRN
  Administered 2021-11-20: 10.1 via INTRAVENOUS

## 2021-11-20 MED ORDER — TECHNETIUM TC 99M TETROFOSMIN IV KIT
30.0000 | PACK | Freq: Once | INTRAVENOUS | Status: AC | PRN
  Administered 2021-11-20: 30 via INTRAVENOUS

## 2021-11-20 MED ORDER — SODIUM CHLORIDE FLUSH 0.9 % IV SOLN
INTRAVENOUS | Status: AC
  Administered 2021-11-20: 10 mL via INTRAVENOUS
  Filled 2021-11-20: qty 10

## 2021-11-20 NOTE — Progress Notes (Signed)
*  PRELIMINARY RESULTS* Echocardiogram 2D Echocardiogram has been performed.  Samuel Germany 11/20/2021, 12:02 PM

## 2021-11-22 ENCOUNTER — Telehealth: Payer: Self-pay | Admitting: *Deleted

## 2021-11-22 MED ORDER — EMPAGLIFLOZIN 10 MG PO TABS: 10.0000 mg | ORAL_TABLET | Freq: Every day | ORAL | 6 refills | Status: DC

## 2021-11-22 MED ORDER — ISOSORBIDE MONONITRATE ER 30 MG PO TB24: 15.0000 mg | ORAL_TABLET | Freq: Every day | ORAL | 1 refills | Status: DC

## 2021-11-22 MED ORDER — DAPAGLIFLOZIN PROPANEDIOL 10 MG PO TABS: 10.0000 mg | ORAL_TABLET | Freq: Every day | ORAL | 6 refills | Status: DC

## 2021-11-22 NOTE — Telephone Encounter (Signed)
Jardiance cost is $121 for a 30 day supply Farixga 10 mg rx sent to [facility]. $136.79 Patient unable to afford jardiance or farxiga and would like to proceed with AZ&ME farxiga patient assistance. Aware to bring proof of income for 2023, social security benefits letter and retirement pension letter to office as well as signing application. Verbalized understanding

## 2021-11-22 NOTE — Telephone Encounter (Signed)
Patient informed and verbalized understanding of plan. Copy sent to PCP 

## 2021-11-22 NOTE — Telephone Encounter (Signed)
-----   Message from Satira Sark, MD sent at 11/20/2021  5:09 PM EDT ----- Results reviewed.  Myoview shows a minor ischemic territory that would not necessarily explain her symptoms and at this point would suggest medical therapy.  She is already on antiplatelet regimen along with Lipitor.  Consider trial of Imdur beginning at 15 mg daily.  Can arrange follow-up in the next 3 months.

## 2021-11-22 NOTE — Telephone Encounter (Signed)
-----   Message from Satira Sark, MD sent at 11/20/2021  5:22 PM EDT ----- Results reviewed.  Recent Myoview low risk.  LVEF remains vigorous.  Filling pressures are elevated with mild diastolic dysfunction and also mildly elevated right ventricular pressures.  Degree of aortic stenosis has not progressed substantially.  She is on a low-dose diuretic.  Could consider addition of SGLT2 inhibitor such as Jardiance or Farxiga 10 mg daily (would check on insurance coverage).

## 2021-11-27 DIAGNOSIS — R1110 Vomiting, unspecified: Secondary | ICD-10-CM | POA: Diagnosis not present

## 2021-11-27 DIAGNOSIS — R197 Diarrhea, unspecified: Secondary | ICD-10-CM | POA: Diagnosis not present

## 2021-11-28 ENCOUNTER — Other Ambulatory Visit (HOSPITAL_COMMUNITY): Payer: Self-pay | Admitting: Family Medicine

## 2021-11-28 DIAGNOSIS — R161 Splenomegaly, not elsewhere classified: Secondary | ICD-10-CM

## 2021-11-29 ENCOUNTER — Telehealth: Payer: Self-pay | Admitting: Cardiology

## 2021-11-29 NOTE — Telephone Encounter (Signed)
Pt c/o medication issue:  1. Name of Medication:  dapagliflozin propanediol (FARXIGA) 10 MG TABS tablet  2. How are you currently taking this medication (dosage and times per day)?   3. Are you having a reaction (difficulty breathing--STAT)?   4. What is your medication issue?  Patient states she has concerns with this medication, but declined discussing with me. Patient states she would prefer to speak directly with Dr. McDowell/RN.

## 2021-11-29 NOTE — Telephone Encounter (Signed)
lmtcb

## 2021-11-29 NOTE — Telephone Encounter (Signed)
Patient states that she has been taking [place] for the past week which has caused her lichen sclerosus to flare up. States that she has been having vaginal discomfort burning and stinging and after reading the side effects, thinks that the [place] is what has caused this. States that she does not want to continue taking the medication and wonders if the jardiance will cause the same symptoms if she were to switch. States that she dropped off the information needed for Farxiga patient assistance yesterday but will apply jardiance if Dr. Domenic Polite feels that this would be beneficial. Please advise.

## 2021-11-29 NOTE — Telephone Encounter (Signed)
Patient made aware. Verbalized understanding.

## 2021-11-29 NOTE — Telephone Encounter (Signed)
Patient made aware, verbalized understanding. Wants to know if there is something else that would be able to take that works the same as the [place]?

## 2021-12-04 NOTE — Telephone Encounter (Signed)
AZ&ME application for farxiga assistance canceled due to side effects patient experienced after starting farxiga.

## 2021-12-10 ENCOUNTER — Ambulatory Visit (HOSPITAL_COMMUNITY)
Admission: RE | Admit: 2021-12-10 | Discharge: 2021-12-10 | Disposition: A | Discharge status: Home / Self Care | Payer: Medicare Other | Source: Ambulatory Visit | Attending: Family Medicine | Admitting: Family Medicine

## 2021-12-10 DIAGNOSIS — K5730 Diverticulosis of large intestine without perforation or abscess without bleeding: Secondary | ICD-10-CM | POA: Diagnosis not present

## 2021-12-10 DIAGNOSIS — R161 Splenomegaly, not elsewhere classified: Secondary | ICD-10-CM | POA: Insufficient documentation

## 2021-12-10 DIAGNOSIS — M47814 Spondylosis without myelopathy or radiculopathy, thoracic region: Secondary | ICD-10-CM | POA: Diagnosis not present

## 2021-12-10 DIAGNOSIS — K449 Diaphragmatic hernia without obstruction or gangrene: Secondary | ICD-10-CM | POA: Diagnosis not present

## 2021-12-10 MED ORDER — IOHEXOL 300 MG/ML  SOLN
100.0000 mL | Freq: Once | INTRAMUSCULAR | Status: AC | PRN
  Administered 2021-12-10: 100 mL via INTRAVENOUS

## 2022-01-24 ENCOUNTER — Ambulatory Visit (INDEPENDENT_AMBULATORY_CARE_PROVIDER_SITE_OTHER): Payer: Medicare Other

## 2022-01-24 DIAGNOSIS — I495 Sick sinus syndrome: Secondary | ICD-10-CM

## 2022-01-25 LAB — CUP PACEART REMOTE DEVICE CHECK
Date Time Interrogation Session: 20231221125631
Implantable Lead Connection Status: 753985
Implantable Lead Connection Status: 753985
Implantable Lead Implant Date: 20180626
Implantable Lead Implant Date: 20180626
Implantable Lead Location: 753859
Implantable Lead Location: 753860
Implantable Lead Model: 377
Implantable Lead Model: 377
Implantable Lead Serial Number: 49791993
Implantable Lead Serial Number: 49865965
Implantable Pulse Generator Implant Date: 20180626
Pulse Gen Model: 407145
Pulse Gen Serial Number: 69052081

## 2022-02-15 NOTE — Progress Notes (Signed)
Remote pacemaker transmission.   

## 2022-03-06 NOTE — Progress Notes (Unsigned)
Cardiology Office Note  Date: 03/07/2022   ID: Amy, [name] [birth year], MRN [id number]  PCP:  [name], MD  Cardiologist:  [name], MD Electrophysiologist:  Cristopher Peru, MD   Chief Complaint  Patient presents with   Cardiac follow-up    History of Present Illness: Amy [name] is a 77 y.o. female last seen in October 2023.  She is here today with her daughter for a follow-up visit.  Reports NYHA class II dyspnea at this point.  She is exercising 4 to 5 days a week using an elliptical machine at home in 20-minute sessions.  Follow-up echocardiogram and Lexiscan Myoview results from October 2023 are noted below.  We did attempt addition of Farxiga, but she has been concerned about side effects and we stopped the medication.  LVEF 65 to 70% with grade 1 diastolic dysfunction and increased LV filling pressures, mildly increased RVSP.  Current regimen includes lisinopril, HCTZ, and Imdur.  Weight is stable.  Biotronik pacemaker in place with follow-up by Dr. Lovena Le.  Device check in December 2023 revealed normal function.  Past Medical History:  Diagnosis Date   Asthma    CAD (coronary artery disease)    60-70% proximal LAD/diagonal bifurcational disease October 2015 at [facility] status DES x 2 to LAD/diagonal October 2015   Essential hypertension    Hyperlipidemia    Hypothyroidism    Sinus node dysfunction ([place])    Biotronik pacemaker in place    Current Outpatient Medications  Medication Sig Dispense Refill   acetaminophen (TYLENOL) 650 MG CR tablet Take 650 mg by mouth daily as needed for pain.     aspirin 81 MG tablet Take 81 mg by mouth daily.     atorvastatin (LIPITOR) 20 MG tablet Take 20 mg by mouth at bedtime.   3   Biotin 5 MG CAPS Take 5 mg by mouth daily at 12 noon.      Cholecalciferol (VITAMIN D3) 2000 units capsule Take 2,000 Units by mouth daily at 12 noon.      clobetasol (TEMOVATE) 0.05 % external solution Apply 1 application topically  daily as needed (psoriasis).      clopidogrel (PLAVIX) 75 MG tablet Take 1 tablet (75 mg total) by mouth daily. 90 tablet 3   hydrochlorothiazide (MICROZIDE) 12.5 MG capsule Take 12.5 mg by mouth daily.     isosorbide mononitrate (IMDUR) 30 MG 24 hr tablet Take 0.5 tablets (15 mg total) by mouth at bedtime. 45 tablet 1   levocetirizine (XYZAL) 5 MG tablet Take 5 mg by mouth as needed for allergies.     levothyroxine (SYNTHROID) 75 MCG tablet Take 75 mcg by mouth every morning.     lisinopril (ZESTRIL) 5 MG tablet Take 5 mg by mouth 2 (two) times daily.     Multiple Vitamin (MULTIVITAMIN WITH MINERALS) TABS tablet Take 1 tablet by mouth daily at 12 noon.      triamcinolone cream (KENALOG) 0.1 % Apply 1 application topically daily as needed (psoriasis). Mixed with nystatin cream 1:1     No current facility-administered medications for this visit.   Allergies:  Sulfa antibiotics   ROS: No palpitations or syncope.  Physical Exam: VS:  BP (!) 148/64   Pulse 61   Ht '4\' 11"'$  (1.499 m)   Wt 146 lb (66.2 kg)   SpO2 99%   BMI 29.49 kg/m , BMI Body mass index is 29.49 kg/m.  Wt Readings from Last 3 Encounters:  03/07/22 146 lb (  66.2 kg)  11/12/21 147 lb (66.7 kg)  08/01/21 146 lb (66.2 kg)    General: Patient appears comfortable at rest. HEENT: Conjunctiva and lids normal. Neck: Supple, no elevated JVP or carotid bruits. Lungs: Clear to auscultation, nonlabored breathing at rest. Cardiac: Regular rate and rhythm, no S3, 2/6 systolic murmur. Extremities: No pitting edema, distal pulses 2+.  ECG:  An ECG dated 11/12/2021 was personally reviewed today and demonstrated:  Atrial paced rhythm with increased voltage.  Recent Labwork:  October 2022: TSH 2.64, hemoglobin 13.3, platelets 183, cholesterol 153, triglycerides 53, HDL 78, LDL 64   Other Studies Reviewed Today:  Echocardiogram 11/20/2021:  1. Left ventricular ejection fraction, by estimation, is 65 to 70%. The  left ventricle has  normal function. The left ventricle has no regional  wall motion abnormalities. Left ventricular diastolic parameters are  consistent with Grade I diastolic  dysfunction (impaired relaxation). Elevated left ventricular end-diastolic  pressure.   2. Right ventricular systolic function is normal. The right ventricular  size is normal. There is mildly elevated pulmonary artery systolic  pressure. The estimated right ventricular systolic pressure is 15.9 mmHg.   3. Left atrial size was severely dilated.   4. The mitral valve is abnormal. Mild mitral valve regurgitation.   5. Tricuspid valve regurgitation is moderate.   6. The aortic valve is tricuspid. Aortic valve regurgitation is trivial.  Mild to moderate aortic valve stenosis. Aortic valve mean gradient  measures 8.0 mmHg. Dimentionless index 0.63.   7. The inferior vena cava is normal in size with greater than 50%  respiratory variability, suggesting right atrial pressure of 3 mmHg.   Lexiscan Myoview 11/20/2021:     Findings are equivocal. The study is low risk.   No ST deviation was noted. The ECG was negative for ischemia.   LV perfusion is abnormal.  Very small, mild intensity, apical anteroseptal defect that is reversible in the setting of breast attenuation artifact.  Cannot exclude a minor ischemic territory.   Left ventricular function is normal. Nuclear stress EF: 69 %. End diastolic cavity size is normal.   Low risk study.  There is a very small region of ischemia at the anteroseptal apex noted in the setting of breast attenuation artifact.  LVEF normal at 69%.  Assessment and Plan:  1.  CAD status post DES x 2 to the LAD/diagonal bifurcation in 2015.  She reports no active angina with stable NYHA class II dyspnea.  Follow-up Lexiscan Myoview in October 2023 was low risk with no large ischemic territories.  Tolerating addition of Imdur.  Otherwise continue aspirin, Plavix, and Lipitor.  2.  Mild to moderate aortic stenosis by  follow-up echocardiogram in October 2023.  AV mean gradient 8 mmHg with dimensionless index 0.63.  Continue observation.  3.  HFpEF, vigorous LVEF at 65 to 70% with mild diastolic dysfunction, increased LV filling pressures, and mildly increased RVSP.  Did not tolerate [place].  Plan to continue current regimen including lisinopril, HCTZ, and Imdur.  Medication Adjustments/Labs and Tests Ordered: Current medicines are reviewed at length with the patient today.  Concerns regarding medicines are outlined above.   Tests Ordered: No orders of the defined types were placed in this encounter.   Medication Changes: No orders of the defined types were placed in this encounter.   Disposition:  Follow up  6 months.  Signed, Satira Sark, MD, [facility] 03/07/2022 9:39 AM    [place] at [facility] [address]  [address], [place], [place] [address] Phone: [phone]; Fax: [phone]

## 2022-03-07 ENCOUNTER — Ambulatory Visit: Payer: Medicare Other | Attending: Cardiology | Admitting: Cardiology

## 2022-03-07 ENCOUNTER — Encounter: Payer: Self-pay | Admitting: Cardiology

## 2022-03-07 VITALS — BP 148/64 | HR 61 | Ht 59.0 in | Wt 146.0 lb

## 2022-03-07 DIAGNOSIS — I25119 Atherosclerotic heart disease of native coronary artery with unspecified angina pectoris: Secondary | ICD-10-CM | POA: Diagnosis not present

## 2022-03-07 DIAGNOSIS — I350 Nonrheumatic aortic (valve) stenosis: Secondary | ICD-10-CM | POA: Diagnosis not present

## 2022-03-07 DIAGNOSIS — I5032 Chronic diastolic (congestive) heart failure: Secondary | ICD-10-CM | POA: Diagnosis not present

## 2022-03-07 NOTE — Patient Instructions (Signed)
Medication Instructions:  Your physician recommends that you continue on your current medications as directed. Please refer to the Current Medication list given to you today.  *If you need a refill on your cardiac medications before your next appointment, please call your pharmacy*   Lab Work: NONE   If you have labs (blood work) drawn today and your tests are completely normal, you will receive your results only by: MyChart Message (if you have MyChart) OR A paper copy in the mail If you have any lab test that is abnormal or we need to change your treatment, we will call you to review the results.   Testing/Procedures: NONE    Follow-Up: At [facility], you and your health needs are our priority.  As part of our continuing mission to provide you with exceptional heart care, we have created designated Provider Care Teams.  These Care Teams include your primary Cardiologist (physician) and Advanced Practice Providers (APPs -  Physician Assistants and Nurse Practitioners) who all work together to provide you with the care you need, when you need it.  We recommend signing up for the patient portal called "MyChart".  Sign up information is provided on this After Visit Summary.  MyChart is used to connect with patients for Virtual Visits (Telemedicine).  Patients are able to view lab/test results, encounter notes, upcoming appointments, etc.  Non-urgent messages can be sent to your provider as well.   To learn more about what you can do with MyChart, go to [email or web address].    Your next appointment:   6 month(s)  Provider:   Samuel McDowell, MD    Other Instructions Thank you for choosing [facility]!    

## 2022-03-14 ENCOUNTER — Other Ambulatory Visit: Payer: Self-pay | Admitting: Cardiology

## 2022-04-15 DIAGNOSIS — Z1231 Encounter for screening mammogram for malignant neoplasm of breast: Secondary | ICD-10-CM | POA: Diagnosis not present

## 2022-04-25 ENCOUNTER — Ambulatory Visit (INDEPENDENT_AMBULATORY_CARE_PROVIDER_SITE_OTHER): Payer: Medicare Other

## 2022-04-25 DIAGNOSIS — I495 Sick sinus syndrome: Secondary | ICD-10-CM | POA: Diagnosis not present

## 2022-04-29 LAB — CUP PACEART REMOTE DEVICE CHECK
Battery Voltage: 55
Date Time Interrogation Session: 20240325065404
Implantable Lead Connection Status: 753985
Implantable Lead Connection Status: 753985
Implantable Lead Implant Date: 20180626
Implantable Lead Implant Date: 20180626
Implantable Lead Location: 753859
Implantable Lead Location: 753860
Implantable Lead Model: 377
Implantable Lead Model: 377
Implantable Lead Serial Number: 49791993
Implantable Lead Serial Number: 49865965
Implantable Pulse Generator Implant Date: 20180626
Pulse Gen Model: 407145
Pulse Gen Serial Number: 69052081

## 2022-05-01 ENCOUNTER — Encounter (INDEPENDENT_AMBULATORY_CARE_PROVIDER_SITE_OTHER): Payer: Self-pay | Admitting: *Deleted

## 2022-05-08 ENCOUNTER — Other Ambulatory Visit: Payer: Self-pay | Admitting: Cardiology

## 2022-05-17 ENCOUNTER — Ambulatory Visit: Payer: Medicare Other | Admitting: Cardiology

## 2022-05-20 DIAGNOSIS — E7849 Other hyperlipidemia: Secondary | ICD-10-CM | POA: Diagnosis not present

## 2022-05-20 DIAGNOSIS — E876 Hypokalemia: Secondary | ICD-10-CM | POA: Diagnosis not present

## 2022-05-20 DIAGNOSIS — R5383 Other fatigue: Secondary | ICD-10-CM | POA: Diagnosis not present

## 2022-05-20 DIAGNOSIS — I5032 Chronic diastolic (congestive) heart failure: Secondary | ICD-10-CM | POA: Diagnosis not present

## 2022-05-20 DIAGNOSIS — E559 Vitamin D deficiency, unspecified: Secondary | ICD-10-CM | POA: Diagnosis not present

## 2022-05-20 DIAGNOSIS — E039 Hypothyroidism, unspecified: Secondary | ICD-10-CM | POA: Diagnosis not present

## 2022-05-21 LAB — LAB REPORT - SCANNED: EGFR: 51.5

## 2022-05-22 DIAGNOSIS — H04123 Dry eye syndrome of bilateral lacrimal glands: Secondary | ICD-10-CM | POA: Diagnosis not present

## 2022-05-29 DIAGNOSIS — Z0001 Encounter for general adult medical examination with abnormal findings: Secondary | ICD-10-CM | POA: Diagnosis not present

## 2022-05-29 DIAGNOSIS — R197 Diarrhea, unspecified: Secondary | ICD-10-CM | POA: Diagnosis not present

## 2022-05-29 DIAGNOSIS — I10 Essential (primary) hypertension: Secondary | ICD-10-CM | POA: Diagnosis not present

## 2022-05-29 DIAGNOSIS — E871 Hypo-osmolality and hyponatremia: Secondary | ICD-10-CM | POA: Diagnosis not present

## 2022-05-29 DIAGNOSIS — I2510 Atherosclerotic heart disease of native coronary artery without angina pectoris: Secondary | ICD-10-CM | POA: Diagnosis not present

## 2022-05-29 DIAGNOSIS — L570 Actinic keratosis: Secondary | ICD-10-CM | POA: Diagnosis not present

## 2022-05-29 DIAGNOSIS — I5032 Chronic diastolic (congestive) heart failure: Secondary | ICD-10-CM | POA: Diagnosis not present

## 2022-05-29 DIAGNOSIS — Z23 Encounter for immunization: Secondary | ICD-10-CM | POA: Diagnosis not present

## 2022-05-29 DIAGNOSIS — I700 Atherosclerosis of aorta: Secondary | ICD-10-CM | POA: Diagnosis not present

## 2022-05-29 DIAGNOSIS — R161 Splenomegaly, not elsewhere classified: Secondary | ICD-10-CM | POA: Diagnosis not present

## 2022-05-29 DIAGNOSIS — E7849 Other hyperlipidemia: Secondary | ICD-10-CM | POA: Diagnosis not present

## 2022-05-29 DIAGNOSIS — R61 Generalized hyperhidrosis: Secondary | ICD-10-CM | POA: Diagnosis not present

## 2022-05-29 DIAGNOSIS — Z950 Presence of cardiac pacemaker: Secondary | ICD-10-CM | POA: Diagnosis not present

## 2022-05-29 NOTE — Progress Notes (Signed)
Remote pacemaker transmission.   

## 2022-06-03 DIAGNOSIS — R161 Splenomegaly, not elsewhere classified: Secondary | ICD-10-CM | POA: Diagnosis not present

## 2022-06-26 ENCOUNTER — Ambulatory Visit (INDEPENDENT_AMBULATORY_CARE_PROVIDER_SITE_OTHER): Payer: Medicare Other | Admitting: Allergy & Immunology

## 2022-06-26 VITALS — BP 168/66 | HR 81 | Temp 97.7°F | Resp 18 | Ht 60.0 in | Wt 143.5 lb

## 2022-06-26 DIAGNOSIS — R197 Diarrhea, unspecified: Secondary | ICD-10-CM

## 2022-06-26 DIAGNOSIS — J302 Other seasonal allergic rhinitis: Secondary | ICD-10-CM | POA: Diagnosis not present

## 2022-06-26 DIAGNOSIS — J3089 Other allergic rhinitis: Secondary | ICD-10-CM | POA: Diagnosis not present

## 2022-06-26 DIAGNOSIS — J310 Chronic rhinitis: Secondary | ICD-10-CM

## 2022-06-26 MED ORDER — IPRATROPIUM BROMIDE 0.03 % NA SOLN
2.0000 | Freq: Three times a day (TID) | NASAL | 5 refills | Status: DC
Start: 2022-06-26 — End: 2022-11-15

## 2022-06-26 MED ORDER — CARBINOXAMINE MALEATE 4 MG PO TABS: 4.0000 mg | ORAL_TABLET | Freq: Two times a day (BID) | ORAL | 5 refills | Status: DC

## 2022-06-26 NOTE — Patient Instructions (Addendum)
1. Chronic rhinitis - Testing today showed: ragweed, trees, indoor molds, and outdoor molds - Copy of test results provided.  - Avoidance measures provided. - Stop taking: whatever mucous pill you are taking - Continue with:  - Start taking: Atrovent (ipratropium) 0.03% one spray per nostril 2-3 times daily as needed (CAN BE OVER DRYING) and carbinoxamine 4mg  twice daily. - Both of these medications can be over drying, so start LOW AND GO SLOW!  - You can use an extra dose of the antihistamine, if needed, for breakthrough symptoms.  - Consider nasal saline rinses 1-2 times daily to remove allergens from the nasal cavities as well as help with mucous clearance (this is especially helpful to do before the nasal sprays are given)  2. Diarrhea - Chocolate was negative. - I would try introducing small amounts and see how things go.  3. Return in about 3 months (around 09/26/2022). You can have the follow up appointment with Dr. Dellis Anes or a Nurse Practicioner (our Nurse Practitioners are excellent and always have Physician oversight!).    Please inform us of any Emergency Department visits, hospitalizations, or changes in symptoms. Call us before going to the ED for breathing or allergy symptoms since we might be able to fit you in for a sick visit. Feel free to contact us anytime with any questions, problems, or concerns.  It was a pleasure to meet you and your family today! Caly and April were the highlights of my day!   Websites that have reliable patient information: 1. American Academy of Asthma, Allergy, and Immunology: www.aaaai.org 2. Food Allergy Research and Education (FARE): foodallergy.org 3. Mothers of Asthmatics: http://www.asthmacommunitynetwork.org 4. American College of Allergy, Asthma, and Immunology: www.acaai.org   COVID-19 Vaccine Information can be found at: [email or web address] For questions related to vaccine  distribution or appointments, please email [email or web address] or call [phone].   We realize that you might be concerned about having an allergic reaction to the COVID19 vaccines. To help with that concern, WE ARE OFFERING THE COVID19 VACCINES IN OUR OFFICE! Ask the front desk for dates!     "Like" [place] on Facebook and Instagram for our latest updates!      A healthy democracy works best when [employer] participate! Make sure you are registered to vote! If you have moved or changed any of your contact information, you will need to get this updated before voting!  In some cases, you MAY be able to register to vote online: [email or web address]        Airborne Adult Perc - 06/26/22 1453     Time Antigen Placed 1453    Allergen Manufacturer Waynette Buttery    Location Back    Number of Test 59    1. Control-Buffer 50% Glycerol Negative    2. Control-Histamine 1 mg/ml 3+    3. Albumin saline Negative    4. Bahia Negative    5. French Southern Territories Negative    6. Johnson Negative    7. Kentucky Blue Negative    8. Meadow Fescue Negative    9. Perennial Rye Negative    10. Sweet Vernal Negative    11. Timothy Negative    12. Cocklebur Negative    13. Burweed Marshelder Negative    14. Ragweed, short Negative    15. Ragweed, Giant Negative    16. Plantain,  English Negative    17. Lamb's Quarters Negative    18. Sheep Sorrell Negative    19. Rough Pigweed Negative    20. Michail Jewels  Elder, Rough Negative    21. Mugwort, Common Negative    22. Ash mix Negative    23. Birch mix Negative    24. Beech American Negative    25. Box, Elder Negative    26. Cedar, red Negative    27. Cottonwood, Guinea-Bissau Negative    28. Elm mix Negative    29. Hickory Negative    30. Maple mix Negative    31. Oak, Guinea-Bissau mix Negative    32. Pecan Pollen Negative    33. Pine mix Negative    34. Sycamore Eastern Negative    35. Walnut, Black Pollen Negative    36. Alternaria alternata  Negative    37. Cladosporium Herbarum Negative    38. Aspergillus mix Negative    39. Penicillium mix Negative    40. Bipolaris sorokiniana (Helminthosporium) Negative    41. Drechslera spicifera (Curvularia) Negative    42. Mucor plumbeus Negative    43. Fusarium moniliforme Negative    44. Aureobasidium pullulans (pullulara) Negative    45. Rhizopus oryzae Negative    46. Botrytis cinera Negative    47. Epicoccum nigrum Negative    48. Phoma betae Negative    49. Candida Albicans Negative    50. Trichophyton mentagrophytes Negative    51. Mite, D Farinae  5,000 AU/ml Negative    52. Mite, D Pteronyssinus  5,000 AU/ml Negative    53. Cat Hair 10,000 BAU/ml Negative    54.  Dog Epithelia Negative    55. Mixed Feathers Negative    56. Horse Epithelia Negative    57. Cockroach, German Negative    58. Mouse Negative    59. Tobacco Leaf Negative             Intradermal - 06/26/22 1600     Time Antigen Placed 1540    Allergen Manufacturer Waynette Buttery    Location Arm    Number of Test 15    Control Negative    French Southern Territories Negative    Johnson Negative    7 Grass Negative    Ragweed mix 1+    Weed mix Negative    Tree mix 2+    Mold 1 Negative    Mold 2 2+    Mold 3 2+    Mold 4 Negative    Cat Negative    Dog Negative    Cockroach Negative    Mite mix Negative             Food Adult Perc - 06/26/22 1400     Time Antigen Placed 1453    Allergen Manufacturer Waynette Buttery    Location Back    Number of allergen test 1    64. Chocolate/Cacao bean Negative             Reducing Pollen Exposure  The American Academy of Allergy, Asthma and Immunology suggests the following steps to reduce your exposure to pollen during allergy seasons.    Do not hang sheets or clothing out to dry; pollen may collect on these items. Do not mow lawns or spend time around freshly cut grass; mowing stirs up pollen. Keep windows closed at night.  Keep car windows closed while driving. Minimize  morning activities outdoors, a time when pollen counts are usually at their highest. Stay indoors as much as possible when pollen counts or humidity is high and on windy days when pollen tends to remain in the air longer. Use air conditioning when possible.  Many air  conditioners have filters that trap the pollen spores. Use a HEPA room air filter to remove pollen form the indoor air you breathe.  Control of Mold Allergen   Mold and fungi can grow on a variety of surfaces provided certain temperature and moisture conditions exist.  Outdoor molds grow on plants, decaying vegetation and soil.  The major outdoor mold, Alternaria and Cladosporium, are found in very high numbers during hot and dry conditions.  Generally, a late Summer - Fall peak is seen for common outdoor fungal spores.  Rain will temporarily lower outdoor mold spore count, but counts rise rapidly when the rainy period ends.  The most important indoor molds are Aspergillus and Penicillium.  Dark, humid and poorly ventilated basements are ideal sites for mold growth.  The next most common sites of mold growth are the bathroom and the kitchen.  Outdoor (Seasonal) Mold Control  Positive outdoor molds via skin testing: Bipolaris (Helminthsporium), Drechslera (Curvalaria), and Mucor  Use air conditioning and keep windows closed Avoid exposure to decaying vegetation. Avoid leaf raking. Avoid grain handling. Consider wearing a face mask if working in moldy areas.    Indoor (Perennial) Mold Control   Positive indoor molds via skin testing: Aspergillus and Penicillium  Maintain humidity below 50%. Clean washable surfaces with 5% bleach solution. Remove sources e.g. contaminated carpets.   Rhinitis (Hayfever) Overview  There are two types of rhinitis: allergic and non-allergic.  Allergic Rhinitis If you have allergic rhinitis, your immune system mistakenly identifies a typically harmless substance as an intruder. This substance  is called an allergen. The immune system responds to the allergen by releasing histamine and chemical mediators that typically cause symptoms in the nose, throat, eyes, ears, skin and roof of the mouth.  Seasonal allergic rhinitis (hay fever) is most often caused by pollen carried in the air during different times of the year in different parts of the country.  Allergic rhinitis can also be triggered by common indoor allergens such as the dried skin flakes, urine and saliva found on pet dander, mold, droppings from dust mites and cockroach particles. This is called perennial allergic rhinitis, as symptoms typically occur year-round.  In addition to allergen triggers, symptoms may also occur from irritants such as smoke and strong odors, or to changes in the temperature and humidity of the air. This happens because allergic rhinitis causes inflammation in the nasal lining, which increases sensitivity to inhalants.  Many people with allergic rhinitis are prone to allergic conjunctivitis (eye allergy). In addition, allergic rhinitis can make symptoms of asthma worse for people who suffer from both conditions.  Nonallergic Rhinitis At least one out of three people with rhinitis symptoms do not have allergies. Nonallergic rhinitis usually afflicts adults and causes year-round symptoms, especially runny nose and nasal congestion. This condition differs from allergic rhinitis because the immune system is not involved.

## 2022-06-26 NOTE — Progress Notes (Unsigned)
NEW PATIENT  Date of Service/Encounter:  06/27/22  Consult requested by: Juliette Alcide, MD   Assessment:   Diarrhea from chocolate - with negative testing today  Seasonal and perennial allergic rhinitis (ragweed, trees, indoor molds, and outdoor molds)  Plan/Recommendations:   1. Chronic rhinitis - Testing today showed: ragweed, trees, indoor molds, and outdoor molds - Copy of test results provided.  - Avoidance measures provided. - Stop taking: whatever mucous pill you are taking - Start taking: Atrovent (ipratropium) 0.03% one spray per nostril 2-3 times daily as needed (CAN BE OVER DRYING) and carbinoxamine 4mg  twice daily. - Both of these medications can be over drying, so start LOW AND GO SLOW!  - You can use an extra dose of the antihistamine, if needed, for breakthrough symptoms.  - Consider nasal saline rinses 1-2 times daily to remove allergens from the nasal cavities as well as help with mucous clearance (this is especially helpful to do before the nasal sprays are given)  2. Diarrhea - Chocolate was negative. - I would try introducing small amounts and see how things go.  3. Return in about 3 months (around 09/26/2022). You can have the follow up appointment with Dr. Dellis Anes or a Nurse Practicioner (our Nurse Practitioners are excellent and always have Physician oversight!).    This note in its entirety was forwarded to the Provider who requested this consultation.  Subjective:   Amy [name] is a 77 y.o. female presenting today for evaluation of  Chief Complaint  Patient presents with   Allergic Rhinitis     Drainage    Allergy Testing    Chocolate-diarrhea, throwing     Amy [name] has a history of the following: Patient Active Problem List   Diagnosis Date Noted   Seasonal and perennial allergic rhinitis 06/27/2022   Diarrhea 06/27/2022   Special screening for malignant neoplasms, colon 12/25/2017   Sinus node dysfunction (HCC) 07/18/2016    Palpitation 07/18/2016    History obtained from: chart review and patient and daughter .  Amy [name] was referred by Juliette Alcide, MD.     Amy [name] is a 77 y.o. female presenting for an evaluation of environmental and food allergies .   Allergic Rhinitis Symptom History: She has a lot of drainage down her throat and she reports a lot of nausea. She starts to have coughing episodes from this.  She had testing done in 1974 that was positive to a lot of items. She does not have this list with her. She was on allergy shots when she was teenager. She is not sure whether they were helpful.   She has tried a number of medications for her drainage. The last one she tried was cetirizine and this did not seem to help much at all. There was a "cheap" mucous relief pill at [facility] and this helped a little bit. This was cheap and the only thing that seemed to work. She has a number of diff  Food Allergy Symptom History: She was having some "violent" GI "spells". She got rid of chocolate and this seems to have improved. She had another episode from chicken salad, but her daughter things that this was "turned".   She is on blood thinners and has a pacemaker. She is followed by Dr. Nona Dell.   Otherwise, there is no history of other atopic diseases, including drug allergies, stinging insect allergies, or contact dermatitis. There is no significant infectious history. Vaccinations are up to date.  Past Medical History: Patient Active Problem List   Diagnosis Date Noted   Seasonal and perennial allergic rhinitis 06/27/2022   Diarrhea 06/27/2022   Special screening for malignant neoplasms, colon 12/25/2017   Sinus node dysfunction (HCC) 07/18/2016   Palpitation 07/18/2016    Medication List:  Allergies as of 06/26/2022       Reactions   Sulfa Antibiotics Rash        Medication List        Accurate as of Jun 26, 2022 11:59 PM. If you have any questions, ask your nurse or doctor.           acetaminophen 650 MG CR tablet Commonly known as: TYLENOL Take 650 mg by mouth daily as needed for pain.   aspirin 81 MG tablet Take 81 mg by mouth daily.   atorvastatin 20 MG tablet Commonly known as: LIPITOR Take 20 mg by mouth at bedtime.   Biotin 5 MG Caps Take 5 mg by mouth daily at 12 noon.   Carbinoxamine Maleate 4 MG Tabs Take 1 tablet (4 mg total) by mouth in the morning and at bedtime. Started by: Alfonse Spruce, MD   clobetasol 0.05 % external solution Commonly known as: TEMOVATE Apply 1 application topically daily as needed (psoriasis).   clopidogrel 75 MG tablet Commonly known as: PLAVIX TAKE 1 TABLET BY MOUTH DAILY   cycloSPORINE 0.05 % ophthalmic emulsion Commonly known as: RESTASIS 1 drop 2 (two) times daily.   hydrochlorothiazide 12.5 MG capsule Commonly known as: MICROZIDE Take 12.5 mg by mouth daily.   ipratropium 0.03 % nasal spray Commonly known as: ATROVENT Place 2 sprays into both nostrils 3 (three) times daily. Started by: Alfonse Spruce, MD   isosorbide mononitrate 30 MG 24 hr tablet Commonly known as: IMDUR TAKE 1/2 TABLET BY MOUTH AT BEDTIME   levocetirizine 5 MG tablet Commonly known as: XYZAL Take 5 mg by mouth as needed for allergies.   levothyroxine 75 MCG tablet Commonly known as: SYNTHROID Take 75 mcg by mouth every morning.   lisinopril 5 MG tablet Commonly known as: ZESTRIL Take 5 mg by mouth 2 (two) times daily.   multivitamin with minerals Tabs tablet Take 1 tablet by mouth daily at 12 noon.   omeprazole 40 MG capsule Commonly known as: PRILOSEC Take 40 mg by mouth daily.   triamcinolone cream 0.1 % Commonly known as: KENALOG Apply 1 application topically daily as needed (psoriasis). Mixed with nystatin cream 1:1   Vitamin D3 50 MCG (2000 UT) capsule Take 2,000 Units by mouth daily at 12 noon.        Birth History: non-contributory  Developmental History: non-contributory  Past  Surgical History: Past Surgical History:  Procedure Laterality Date   ABDOMINAL HYSTERECTOMY     ANAL FISSURE REPAIR     CATARACT EXTRACTION W/PHACO Right 10/17/2014   Procedure: CATARACT EXTRACTION PHACO AND INTRAOCULAR LENS PLACEMENT (IOC);  Surgeon: Gemma Payor, MD;  Location: [facility];  Service: Ophthalmology;  Laterality: Right;  CDE: 4.88   CATARACT EXTRACTION W/PHACO Left 10/27/2014   Procedure: CATARACT EXTRACTION PHACO AND INTRAOCULAR LENS PLACEMENT (IOC);  Surgeon: Gemma Payor, MD;  Location: [facility];  Service: Ophthalmology;  Laterality: Left;  CDE:7.86   COLONOSCOPY N/A 04/16/2018   Procedure: COLONOSCOPY;  Surgeon: Malissa Hippo, MD;  Location: [facility] SUITE;  Service: Endoscopy;  Laterality: N/A;  830   FINGER SURGERY     NASAL SINUS SURGERY     PACEMAKER IMPLANT N/A 07/30/2016  Procedure: Pacemaker Implant Dual Chamber;  Surgeon: Marinus Maw, MD;  Location: [facility] INVASIVE CV LAB;  Service: Cardiovascular;  Laterality: N/A;   TONSILLECTOMY       Family History: Family History  Problem Relation Age of Onset   Heart disease Father      Social History: Akaiya lives at home with her family.  She lives in a house that was built in 1956.  There is carpeting throughout the home.  She has gas heating and central cooling.  There are no dust mite covers on the bedding.  There is no tobacco exposure.  There is no fume, chemical, or dust exposure.  They do not live near an interstate or industrial area.   Review of Systems  Constitutional: Negative.  Negative for chills, fever, malaise/fatigue and weight loss.  HENT:  Positive for congestion. Negative for ear discharge and ear pain.   Eyes:  Negative for pain, discharge and redness.  Respiratory:  Negative for cough, sputum production, shortness of breath and wheezing.   Cardiovascular: Negative.  Negative for chest pain and palpitations.  Gastrointestinal:  Negative for abdominal pain, heartburn, nausea and vomiting.  Skin: Negative.   Negative for itching and rash.  Neurological:  Negative for dizziness and headaches.  Endo/Heme/Allergies:  Positive for environmental allergies. Does not bruise/bleed easily.       Objective:   Blood pressure (!) 168/66, pulse 81, temperature 97.7 F (36.5 C), resp. rate 18, height 5' (1.524 m), weight 143 lb 8 oz (65.1 kg), SpO2 97 %. Body mass index is 28.03 kg/m.     Physical Exam Vitals reviewed.  Constitutional:      Appearance: She is well-developed.  HENT:     Head: Normocephalic and atraumatic.     Right Ear: Tympanic membrane, ear canal and external ear normal. No drainage, swelling or tenderness. Tympanic membrane is not injected, scarred, erythematous, retracted or bulging.     Left Ear: Tympanic membrane, ear canal and external ear normal. No drainage, swelling or tenderness. Tympanic membrane is not injected, scarred, erythematous, retracted or bulging.     Nose: No nasal deformity, septal deviation, mucosal edema or rhinorrhea.     Right Turbinates: Enlarged, swollen and pale.     Left Turbinates: Enlarged, swollen and pale.     Right Sinus: No maxillary sinus tenderness or frontal sinus tenderness.     Left Sinus: No maxillary sinus tenderness or frontal sinus tenderness.     Comments: No nasal polyps.     Mouth/Throat:     Mouth: Mucous membranes are not pale and not dry.     Pharynx: Uvula midline.  Eyes:     General:        Right eye: No discharge.        Left eye: No discharge.     Conjunctiva/sclera: Conjunctivae normal.     Right eye: Right conjunctiva is not injected. No chemosis.    Left eye: Left conjunctiva is not injected. No chemosis.    Pupils: Pupils are equal, round, and reactive to light.  Cardiovascular:     Rate and Rhythm: Normal rate and regular rhythm.     Heart sounds: Normal heart sounds.  Pulmonary:     Effort: Pulmonary effort is normal. No tachypnea, accessory muscle usage or respiratory distress.     Breath sounds: Normal  breath sounds. No wheezing, rhonchi or rales.  Chest:     Chest wall: No tenderness.  Abdominal:     Tenderness: There  is no abdominal tenderness. There is no guarding or rebound.  Lymphadenopathy:     Head:     Right side of head: No submandibular, tonsillar or occipital adenopathy.     Left side of head: No submandibular, tonsillar or occipital adenopathy.     Cervical: No cervical adenopathy.  Skin:    Coloration: Skin is not pale.     Findings: No abrasion, erythema, petechiae or rash. Rash is not papular, urticarial or vesicular.  Neurological:     Mental Status: She is alert.  Psychiatric:        Behavior: Behavior is cooperative.      Diagnostic studies:   Allergy Studies:     Airborne Adult Perc - 06/26/22 1453     Time Antigen Placed 1453    Allergen Manufacturer Waynette Buttery    Location Back    Number of Test 59    52. Mite, D Pteronyssinus  5,000 AU/ml Negative    1. Control-Buffer 50% Glycerol Negative    2. Control-Histamine 3+    3. Albumin saline Negative    3. Bahia Negative    4. French Southern Territories Negative    5. Johnson Negative    6. Kentucky Blue Negative    7. Meadow Fescue Negative    8. Perennial Rye Negative    9. Timothy Negative    10. Sweet Vernal Negative    11. Cocklebur Negative    12. Plantain,  English Negative    16. Lamb's Quarters Negative    17. Sheep Sorrell Negative    18. Rough Pigweed Negative    19. Marsh Elder, Rough Negative    20. Mugwort, Common Negative    14. Ragweed, short Negative    15. Ragweed, Giant Negative    20. Marsh Elder, Rough Negative    21. Box, Elder Negative    22. Cedar, red Negative    24. Pecan Pollen Negative    25. Pine Mix Negative    26. Walnut, Black Pollen Negative    28. Ash Mix Negative    29. Birch Mix Negative    30. Beech American Negative    31. Cottonwood, Guinea-Bissau Negative    33. Maple Mix Negative    34. Oak, Guinea-[place]issau Mix Negative    35. Sycamore Eastern Negative    28. Elm mix Negative     29. Hickory Negative    36. Alternaria Alternata Negative    37. Cladosporium Herbarum Negative    38. Aspergillus Mix Negative    39. Penicillium Mix Negative    40. Bipolaris Sorokiniana (Helminthosporium) Negative    41. Drechslera Spicifera (Curvularia) Negative    42. Mucor Plumbeus Negative    43. Fusarium Moniliforme Negative    44. Aureobasidium Pullulans (pullulara) Negative    45. Rhizopus Oryzae Negative    46. Botrytis Cinera Negative    47. Epicoccum Nigrum Negative    48. Phoma Betae Negative    50. Cat Hair 10,000 BAU/ml Negative    51.  Dog Epithelia Negative    52. Mixed Feathers Negative    53. Horse Epithelia Negative    54. Cockroach, German Negative    55. Tobacco Leaf Negative    51. Mite, D Farinae  5,000 AU/ml Negative    49. Candida Albicans Negative    50. Trichophyton mentagrophytes Negative    58. Mouse Negative             Intradermal - 06/26/22 1600     Time Antigen  Placed 1540    Allergen Manufacturer Greer    Location Arm    Number of Test 15    Control Negative    French Southern Territories Negative    Johnson Negative    7 Grass Negative    Ragweed Mix 1+    Weed Mix Negative    Tree Mix 2+    Mold 1 Negative    Mold 2 2+    Mold 3 2+    Mold 4 Negative    Mite Mix Negative    Cat Negative    Dog Negative    Cockroach Negative             Food Adult Perc - 06/26/22 1400     Time Antigen Placed 1453    Allergen Manufacturer Waynette Buttery    Location Back    Number of allergen test 1    66. Chocolate/Cacao Bean Negative             Allergy testing results were read and interpreted by myself, documented by clinical staff.         Malachi Bonds, MD [facility]

## 2022-06-27 ENCOUNTER — Telehealth: Payer: Self-pay

## 2022-06-27 ENCOUNTER — Encounter: Payer: Self-pay | Admitting: Allergy & Immunology

## 2022-06-27 DIAGNOSIS — J302 Other seasonal allergic rhinitis: Secondary | ICD-10-CM | POA: Insufficient documentation

## 2022-06-27 DIAGNOSIS — R197 Diarrhea, unspecified: Secondary | ICD-10-CM | POA: Insufficient documentation

## 2022-06-27 NOTE — Addendum Note (Signed)
Addended by: Alfonse Spruce on: 06/27/2022 08:16 PM   Modules accepted: Orders

## 2022-06-27 NOTE — Telephone Encounter (Signed)
*  Asthma/Allergy  PA request received via CMM for Carbinoxamine Maleate 4MG  tablets  PA submitted to [facility] Medicare via CMM and is pending additional questions/determination  Key: [id number]  *no notation of previous trial/failures

## 2022-06-28 MED ORDER — CYPROHEPTADINE HCL 4 MG PO TABS: 4.0000 mg | ORAL_TABLET | Freq: Three times a day (TID) | ORAL | 1 refills | Status: DC | PRN

## 2022-06-28 NOTE — Telephone Encounter (Signed)
Patient Advocate Encounter  Received a fax from [facility] regarding Prior Authorization for Carbinoxamine Maleate 4MG  tablets.   Key: [id number]  Authorization has been DENIED due to

## 2022-06-28 NOTE — Telephone Encounter (Signed)
Patient has to try and fail 2 preferred medications including Levocetirizine, desloratadine, cyproheptadine, or hydroxyzine in order for Carbinoxamine to be covered.

## 2022-06-28 NOTE — Addendum Note (Signed)
Addended by: Alfonse Spruce on: 06/28/2022 04:21 PM   Modules accepted: Orders

## 2022-06-28 NOTE — Telephone Encounter (Signed)
Called and informed patient of change in medication. Patient verbalized understanding.  

## 2022-06-28 NOTE — Telephone Encounter (Signed)
She has been on levocetirizine dating back to October 2023.  Let's try cyproheptadine 4mg  every 8 hours as needed. I sent in the script. Please let the patient know.   Malachi Bonds, MD [facility]

## 2022-07-03 NOTE — Telephone Encounter (Signed)
Forwarding message to PA Team to process.

## 2022-07-03 NOTE — Telephone Encounter (Signed)
Patient called back as she received a message from the pharmacy saying the Cyproheptadine will need a prior auth. I called the pharmacy to double check and this is correct. The pharmacy gave me a PA Key to put in cover my meds [id number].  Patient is still congested and started back taking the OTC Mucous Pill for now.   Please advise to another change.   [facility]

## 2022-07-04 NOTE — Telephone Encounter (Signed)
Forwarding message back to PA Team:  Patient has to try and fail 2 preferred medications including Levocetirizine, desloratadine, cyproheptadine, or hydroxyzine in order for Carbinoxamine to be covered.   It is the Cyproheptadine that is requiring a PA per patient's pharmacy:   The pharmacy gave me a PA Key to put in cover my meds [id number].    Please process PA.

## 2022-07-04 NOTE — Telephone Encounter (Signed)
See previous notes.

## 2022-07-05 ENCOUNTER — Other Ambulatory Visit (HOSPITAL_COMMUNITY): Payer: Self-pay

## 2022-07-05 ENCOUNTER — Other Ambulatory Visit: Payer: Self-pay | Admitting: *Deleted

## 2022-07-05 NOTE — Telephone Encounter (Signed)
Insurance will only cover one tablet daily for these types of medications. PA not able to be completed.

## 2022-07-05 NOTE — Telephone Encounter (Signed)
Forwarding message to provider for next step. 

## 2022-07-05 NOTE — Telephone Encounter (Signed)
That is fine - we can do one tablet daily.  Malachi Bonds, MD [facility]

## 2022-07-08 ENCOUNTER — Telehealth: Payer: Self-pay

## 2022-07-08 ENCOUNTER — Other Ambulatory Visit (HOSPITAL_COMMUNITY): Payer: Self-pay

## 2022-07-08 MED ORDER — CYPROHEPTADINE HCL 4 MG PO TABS: 4.0000 mg | ORAL_TABLET | Freq: Every day | ORAL | 1 refills | Status: DC

## 2022-07-08 NOTE — Addendum Note (Signed)
Addended by: Robet Leu A on: 07/08/2022 09:30 AM   Modules accepted: Orders

## 2022-07-08 NOTE — Telephone Encounter (Signed)
Received fax from [facility] - APPROVED Cyproheptadine HCL 4 mg with following stipulations:  Approved under member's Medicare D benefit: quantity 30 units per 10 day(s). You may fill up to a 90 Day supply except for those on Specialty Tier 5, which can be filled upto a 30 day supply.  Approval: 06/24/22 - until further notice.

## 2022-07-08 NOTE — Telephone Encounter (Signed)
Called and left  message for patient to call our office back to notify her that the periactin was sent in to her pharmacy to take once daily.

## 2022-07-08 NOTE — Telephone Encounter (Signed)
Patient Advocate Encounter   Received notification from [facility] that prior authorization is required for Cyproheptadine HCl 4MG  tablets   Submitted: 07-08-2022 Key [id number]  Status is pending

## 2022-07-10 NOTE — Telephone Encounter (Signed)
Patient Advocate Encounter  Prior Authorization for Cyproheptadine HCl 4MG  tablets has been approved through [employer].    Key: [id number]  Effective: 07-08-2022 to 02-03-2098  Approved quantity: 30 units per 10 day(s). You may fill up to a 90 day supply except for those on Specialty Tier 5, which can be filled up to a 30 day supply.

## 2022-07-11 ENCOUNTER — Encounter: Payer: Self-pay | Admitting: Internal Medicine

## 2022-07-11 ENCOUNTER — Ambulatory Visit (INDEPENDENT_AMBULATORY_CARE_PROVIDER_SITE_OTHER): Payer: Medicare Other | Admitting: Internal Medicine

## 2022-07-11 VITALS — BP 197/71 | HR 73 | Ht 60.0 in | Wt 144.8 lb

## 2022-07-11 DIAGNOSIS — E782 Mixed hyperlipidemia: Secondary | ICD-10-CM | POA: Diagnosis not present

## 2022-07-11 DIAGNOSIS — L409 Psoriasis, unspecified: Secondary | ICD-10-CM | POA: Diagnosis not present

## 2022-07-11 DIAGNOSIS — I2510 Atherosclerotic heart disease of native coronary artery without angina pectoris: Secondary | ICD-10-CM

## 2022-07-11 DIAGNOSIS — M8580 Other specified disorders of bone density and structure, unspecified site: Secondary | ICD-10-CM | POA: Diagnosis not present

## 2022-07-11 DIAGNOSIS — K219 Gastro-esophageal reflux disease without esophagitis: Secondary | ICD-10-CM

## 2022-07-11 DIAGNOSIS — E039 Hypothyroidism, unspecified: Secondary | ICD-10-CM | POA: Diagnosis not present

## 2022-07-11 DIAGNOSIS — I10 Essential (primary) hypertension: Secondary | ICD-10-CM

## 2022-07-11 DIAGNOSIS — L900 Lichen sclerosus et atrophicus: Secondary | ICD-10-CM

## 2022-07-11 DIAGNOSIS — R161 Splenomegaly, not elsewhere classified: Secondary | ICD-10-CM | POA: Diagnosis not present

## 2022-07-11 DIAGNOSIS — I495 Sick sinus syndrome: Secondary | ICD-10-CM

## 2022-07-11 MED ORDER — ISOSORBIDE MONONITRATE ER 30 MG PO TB24: 30.0000 mg | ORAL_TABLET | Freq: Every day | ORAL | 2 refills | Status: DC

## 2022-07-11 NOTE — Progress Notes (Signed)
New Patient Office Visit  Subjective    Patient ID: Amy [name], female    DOB: [birth year]  Age: 77 y.o. MRN: [id number]  CC:  Chief Complaint  Patient presents with   Establish Care    HPI DEMECIA [name] presents to establish care.  She is a 77 year old woman who endorses a past medical history significant for CAD s/p DES x 2 (2015), sinus node dysfunction s/p pacemaker implantation (2018), GERD, osteopenia, splenomegaly, vertigo, HTN, HLD, hypothyroidism, HFpEF, lichen sclerosus, and psoriasis.  She was previously followed at [facility] in [place].  Amy [name] reports feeling well today.  She is asymptomatic and her acute concern is splenomegaly that has been noted on prior imaging.  MRI abdomen was recently ordered by her previous provider, but was canceled once it was realized that she had a pacemaker.  She has an appointment with gastroenterology scheduled for 6/11.  She has no additional concerns to discuss at this time.  She has previously worked at [employer].  She denies tobacco, alcohol, and illicit drug use.  Her family medical history is significant for CAD.  Acute concerns, chronic medical conditions, and outstanding preventative care items discussed today are individually addressed A/P below.   Outpatient Encounter Medications as of 07/11/2022  Medication Sig   acetaminophen (TYLENOL) 650 MG CR tablet Take 650 mg by mouth daily as needed for pain.   aspirin 81 MG tablet Take 81 mg by mouth daily.   atorvastatin (LIPITOR) 20 MG tablet Take 20 mg by mouth at bedtime.    calcium carbonate (OSCAL) 1500 (600 Ca) MG TABS tablet Take by mouth 2 (two) times daily with a meal.   Cholecalciferol (VITAMIN D3) 2000 units capsule Take 2,000 Units by mouth daily at 12 noon.    clobetasol (TEMOVATE) 0.05 % external solution Apply 1 application topically daily as needed (psoriasis).    clopidogrel (PLAVIX) 75 MG tablet TAKE 1 TABLET BY MOUTH DAILY   hydrochlorothiazide  (MICROZIDE) 12.5 MG capsule Take 12.5 mg by mouth daily.   ipratropium (ATROVENT) 0.03 % nasal spray Place 2 sprays into both nostrils 3 (three) times daily.   isosorbide mononitrate (IMDUR) 30 MG 24 hr tablet Take 1 tablet (30 mg total) by mouth daily.   levothyroxine (SYNTHROID) 75 MCG tablet Take 75 mcg by mouth every morning.   lisinopril (ZESTRIL) 5 MG tablet Take 5 mg by mouth 2 (two) times daily.   Multiple Vitamin (MULTIVITAMIN WITH MINERALS) TABS tablet Take 1 tablet by mouth daily at 12 noon.    omeprazole (PRILOSEC) 40 MG capsule Take 40 mg by mouth daily.   triamcinolone cream (KENALOG) 0.1 % Apply 1 application topically daily as needed (psoriasis). Mixed with nystatin cream 1:1   [DISCONTINUED] Biotin 5 MG CAPS Take 5 mg by mouth daily at 12 noon.    [DISCONTINUED] isosorbide mononitrate (IMDUR) 30 MG 24 hr tablet TAKE 1/2 TABLET BY MOUTH AT BEDTIME   [DISCONTINUED] Carbinoxamine Maleate 4 MG TABS Take 1 tablet (4 mg total) by mouth in the morning and at bedtime.   [DISCONTINUED] cyproheptadine (PERIACTIN) 4 MG tablet Take 1 tablet (4 mg total) by mouth daily.   No facility-administered encounter medications on file as of 07/11/2022.    Past Medical History:  Diagnosis Date   Asthma    CAD (coronary artery disease)    60-70% proximal LAD/diagonal bifurcational disease October 2015 at [facility] status DES x 2 to LAD/diagonal October 2015   Essential hypertension  Hyperlipidemia    Hypothyroidism    Sinus node dysfunction (HCC)    Biotronik pacemaker in place    Past Surgical History:  Procedure Laterality Date   ABDOMINAL HYSTERECTOMY     ANAL FISSURE REPAIR     CATARACT EXTRACTION W/PHACO Right 10/17/2014   Procedure: CATARACT EXTRACTION PHACO AND INTRAOCULAR LENS PLACEMENT (IOC);  Surgeon: Gemma Payor, MD;  Location: [facility];  Service: Ophthalmology;  Laterality: Right;  CDE: 4.88   CATARACT EXTRACTION W/PHACO Left 10/27/2014   Procedure: CATARACT EXTRACTION PHACO AND  INTRAOCULAR LENS PLACEMENT (IOC);  Surgeon: Gemma Payor, MD;  Location: [facility];  Service: Ophthalmology;  Laterality: Left;  CDE:7.86   COLONOSCOPY N/A 04/16/2018   Procedure: COLONOSCOPY;  Surgeon: Malissa Hippo, MD;  Location: [facility] SUITE;  Service: Endoscopy;  Laterality: N/A;  830   FINGER SURGERY     NASAL SINUS SURGERY     PACEMAKER IMPLANT N/A 07/30/2016   Procedure: Pacemaker Implant Dual Chamber;  Surgeon: Marinus Maw, MD;  Location: [facility] INVASIVE CV LAB;  Service: Cardiovascular;  Laterality: N/A;   TONSILLECTOMY      Family History  Problem Relation Age of Onset   Heart disease Father     Social History   Socioeconomic History   Marital status: Widowed    Spouse name: Not on file   Number of children: Not on file   Years of education: Not on file   Highest education level: Not on file  Occupational History   Not on file  Tobacco Use   Smoking status: Never   Smokeless tobacco: Never  Vaping Use   Vaping Use: Never used  Substance and Sexual Activity   Alcohol use: No    Alcohol/week: 0.0 standard drinks of alcohol   Drug use: No   Sexual activity: Never  Other Topics Concern   Not on file  Social History Narrative   Not on file   Social Determinants of Health   Financial Resource Strain: Not on file  Food Insecurity: Not on file  Transportation Needs: Not on file  Physical Activity: Not on file  Stress: Not on file  Social Connections: Not on file  Intimate Partner Violence: Not on file   Review of Systems  Constitutional:  Negative for chills and fever.  HENT:  Negative for sore throat.   Respiratory:  Negative for cough and shortness of breath.   Cardiovascular:  Negative for chest pain, palpitations and leg swelling.  Gastrointestinal:  Negative for abdominal pain, blood in stool, constipation, diarrhea, nausea and vomiting.  Genitourinary:  Negative for dysuria and hematuria.  Musculoskeletal:  Negative for myalgias.  Skin:  Negative for  itching and rash.  Neurological:  Negative for dizziness and headaches.  Psychiatric/Behavioral:  Negative for depression and suicidal ideas.     Objective    BP (!) 197/71   Pulse 73   Ht 5' (1.524 m)   Wt 144 lb 12.8 oz (65.7 kg)   SpO2 98%   BMI 28.28 kg/m   Physical Exam Vitals reviewed.  Constitutional:      General: She is not in acute distress.    Appearance: Normal appearance. She is not toxic-appearing.  HENT:     Head: Normocephalic and atraumatic.     Right Ear: External ear normal.     Left Ear: External ear normal.     Nose: Nose normal. No congestion or rhinorrhea.     Mouth/Throat:     Mouth: Mucous membranes are  moist.     Pharynx: Oropharynx is clear. No oropharyngeal exudate or posterior oropharyngeal erythema.  Eyes:     General: No scleral icterus.    Extraocular Movements: Extraocular movements intact.     Conjunctiva/sclera: Conjunctivae normal.     Pupils: Pupils are equal, round, and reactive to light.  Cardiovascular:     Rate and Rhythm: Normal rate and regular rhythm.     Pulses: Normal pulses.     Heart sounds: Murmur heard.     No friction rub. No gallop.  Pulmonary:     Effort: Pulmonary effort is normal.     Breath sounds: Normal breath sounds. No wheezing, rhonchi or rales.  Abdominal:     General: Abdomen is flat. Bowel sounds are normal. There is no distension.     Palpations: Abdomen is soft.     Tenderness: There is no abdominal tenderness.  Musculoskeletal:        General: No swelling. Normal range of motion.     Cervical back: Normal range of motion.     Right lower leg: No edema.     Left lower leg: No edema.  Lymphadenopathy:     Cervical: No cervical adenopathy.  Skin:    General: Skin is warm and dry.     Capillary Refill: Capillary refill takes less than 2 seconds.     Coloration: Skin is not jaundiced.  Neurological:     General: No focal deficit present.     Mental Status: She is alert and oriented to person,  place, and time.  Psychiatric:        Mood and Affect: Mood normal.        Behavior: Behavior normal.    Assessment & Plan:   Problem List Items Addressed This Visit       Sinus node dysfunction (HCC) - Primary    S/p pacemaker implantation in June 2018.  Followed by cardiology (Dr. Ladona Ridgel).  Cardiology follow-up scheduled for 6/25.      Coronary artery disease    History of CAD s/p DES x 2 in 2015.  She is currently on DAPT (ASA/Plavix) and atorvastatin.  Cardiology follow-up scheduled for later this month.      Essential hypertension    Her blood pressure is significantly elevated today, 197/71.  She is currently prescribed lisinopril 5 mg twice daily, HCTZ 12.5 mg daily, and Imdur 15 mg daily.  She checks her blood pressure regularly at home and reports that BP is typically well-controlled. -Increase Imdur to 30 mg daily -Follow-up in 2-4 weeks for HTN check      GERD (gastroesophageal reflux disease)    Symptoms are adequately controlled with omeprazole.  No medication changes are indicated today.      Hypothyroidism    She reports a history of hypothyroidism.  Etiology unclear.  She is currently prescribed levothyroxine 75 mcg daily.  Labs recently updated.  Records from her previous PCP have been requested today.      Osteopenia    She endorses a history of osteopenia and is currently on vitamin D + calcium supplementation.  Reports that she is due for repeat DEXA in August.      Psoriasis    No plaques appreciated on exam today.  She is prescribed betamethasone for as needed application.      Splenomegaly    Noted on recent imaging.  MRI scheduled but not completed due to pacemaker incompatibility.  She is scheduled to see gastroenterology next week (6/11).  Hyperlipidemia    Currently prescribed atorvastatin 20 mg daily.  She reports that labs were recently updated by her former PCP.  Records have been requested today.      Lichen sclerosus    She is  prescribed Temovate and Kenalog for as needed application       Return in about 2 weeks (around 07/25/2022) for HTN, lab / record review.   Billie Lade, MD

## 2022-07-11 NOTE — Patient Instructions (Signed)
It was a pleasure to see you today.  Thank you for giving [place] the opportunity to be involved in your care.  Below is a brief recap of your visit and next steps.  We will plan to see you again in 2-4 weeks.  Summary You have established care today Records from [facility] have been requested. Increase Imdur to 30 mg daily Follow up in 2-4 weeks for BP check, lab review

## 2022-07-16 ENCOUNTER — Encounter (INDEPENDENT_AMBULATORY_CARE_PROVIDER_SITE_OTHER): Payer: Self-pay | Admitting: Gastroenterology

## 2022-07-16 ENCOUNTER — Ambulatory Visit (INDEPENDENT_AMBULATORY_CARE_PROVIDER_SITE_OTHER): Payer: Medicare Other | Admitting: Gastroenterology

## 2022-07-16 VITALS — BP 148/80 | Temp 97.4°F | Ht 60.0 in | Wt 144.2 lb

## 2022-07-16 DIAGNOSIS — R161 Splenomegaly, not elsewhere classified: Secondary | ICD-10-CM

## 2022-07-16 DIAGNOSIS — K769 Liver disease, unspecified: Secondary | ICD-10-CM

## 2022-07-16 NOTE — Patient Instructions (Signed)
I would like to check another lab in regards to your elevated bilirubin I will discuss further imaging with Dr. Levon Hedger regarding your enlarged spleen, I will be in touch for further recommendations after this  It was a pleasure to see you today. I want to create trusting relationships with patients and provide genuine, compassionate, and quality care. I truly value your feedback! please be on the lookout for a survey regarding your visit with me today. I appreciate your input about our visit and your time in completing this!    Mareon Robinette L. Jeanmarie Hubert, MSN, APRN, AGNP-C Adult-Gerontology Nurse Practitioner [facility] at [facility]

## 2022-07-16 NOTE — Progress Notes (Addendum)
Referring Provider: Juliette Alcide, MD Primary Care Physician:  Billie Lade, MD Primary GI Physician: new   Chief Complaint  Patient presents with   Gastroesophageal Reflux    Referred for gerd and vomiting. Patient states she stopped eating chocolate and symptoms got better. Takes med for reflux and states it is working good. Patient states the reason for her visit is for enlarged spleen.    HPI:   Amy [name] is a 77 y.o. female with past medical history of Asthma, CAD, HTN, HLD, Hypothyroidism, sinus node dysfunction with pacemaker in place, GERD, osteopenia, vertigo, lichen sclerosis, psoriasis  Patient presenting today as a new patient for vomiting/diarrhea, also with splenomegaly   RUQ [place] 11/2021 with enlarged spleen, no focal splenic lesions, normal GB, normal Bile ducts, liver with normal texture uniformly, portal vein unremarkable CT Abd with contrast 12/2021 with splenomegaly measuring 18cm in craniocaudal dimension, small hiatal hernia. Subcentimeter segment 4 hypodensity (2:8), too small to characterize.  Patient notes when appt was initially made she was having intermittent episodes of vomiting and diarrhea. She started seeing allergist, stopped eating chocolate which she was told she was not allergic but states that this GI symptoms resolved after she stopped eating it.  She notes that she was told that she had an enlarged spleen on [place] in October. She does report she was told when pacemaker was implanted that she could not have any MRIs with the pacemaker, however, her biotronik card states her pacemaker is MRI compatible. No history of chemotherapy in the past. She does note a fall with an injury to RUQ in 2018 though notes she had xrays and told she had a cracked rib. No CT scans at that time   Recent labs in April 2024 with AST16, ALT 13, alk phos 100, t bili 1.4 (1.8 in 2022), platelet count 164  Patients daughter states she started taking Balance of nature  which is a fruit extra supplement. She has felt much better since starting this.   She notes that her GERD is well controlled on omeprazole 40mg  daily. Daughter reports she has a lot of sinus drainage. Is doing better with this since allergist started her on nasal spray.   NSAID use: takes tylenol  Social hx: no tobacco or etoh  Fam hx: no CRC, or liver disease   Last Colonoscopy:04/16/18 diverticulosis, internal hemorrhoids  Last Endoscopy: never   Recommendations:    Past Medical History:  Diagnosis Date   Asthma    CAD (coronary artery disease)    60-70% proximal LAD/diagonal bifurcational disease October 2015 at [facility] status DES x 2 to LAD/diagonal October 2015   Essential hypertension    Hyperlipidemia    Hypothyroidism    Sinus node dysfunction (HCC)    Biotronik pacemaker in place    Past Surgical History:  Procedure Laterality Date   ABDOMINAL HYSTERECTOMY     ANAL FISSURE REPAIR     CATARACT EXTRACTION W/PHACO Right 10/17/2014   Procedure: CATARACT EXTRACTION PHACO AND INTRAOCULAR LENS PLACEMENT (IOC);  Surgeon: Gemma Payor, MD;  Location: [facility];  Service: Ophthalmology;  Laterality: Right;  CDE: 4.88   CATARACT EXTRACTION W/PHACO Left 10/27/2014   Procedure: CATARACT EXTRACTION PHACO AND INTRAOCULAR LENS PLACEMENT (IOC);  Surgeon: Gemma Payor, MD;  Location: [facility];  Service: Ophthalmology;  Laterality: Left;  CDE:7.86   COLONOSCOPY N/A 04/16/2018   Procedure: COLONOSCOPY;  Surgeon: Malissa Hippo, MD;  Location: [facility];  Service: Endoscopy;  Laterality: N/A;  830   FINGER SURGERY     NASAL SINUS SURGERY     PACEMAKER IMPLANT N/A 07/30/2016   Procedure: Pacemaker Implant Dual Chamber;  Surgeon: Marinus Maw, MD;  Location: [facility] INVASIVE CV LAB;  Service: Cardiovascular;  Laterality: N/A;   TONSILLECTOMY      Current Outpatient Medications  Medication Sig Dispense Refill   acetaminophen (TYLENOL) 650 MG CR tablet Take 650 mg by mouth daily as needed for  pain.     aspirin 81 MG tablet Take 81 mg by mouth daily.     atorvastatin (LIPITOR) 20 MG tablet Take 20 mg by mouth at bedtime.   3   Biotin 5 MG CAPS Take 5 mg by mouth daily at 12 noon.      calcium carbonate (OSCAL) 1500 (600 Ca) MG TABS tablet Take by mouth 2 (two) times daily with a meal.     Cholecalciferol (VITAMIN D3) 2000 units capsule Take 2,000 Units by mouth daily at 12 noon.      clobetasol (TEMOVATE) 0.05 % external solution Apply 1 application topically daily as needed (psoriasis).      clopidogrel (PLAVIX) 75 MG tablet TAKE 1 TABLET BY MOUTH DAILY 90 tablet 3   hydrochlorothiazide (MICROZIDE) 12.5 MG capsule Take 12.5 mg by mouth daily.     ipratropium (ATROVENT) 0.03 % nasal spray Place 2 sprays into both nostrils 3 (three) times daily. 30 mL 5   isosorbide mononitrate (IMDUR) 30 MG 24 hr tablet Take 1 tablet (30 mg total) by mouth daily. 30 tablet 2   levothyroxine (SYNTHROID) 75 MCG tablet Take 75 mcg by mouth every morning.     lisinopril (ZESTRIL) 5 MG tablet Take 5 mg by mouth 2 (two) times daily.     Multiple Vitamin (MULTIVITAMIN WITH MINERALS) TABS tablet Take 1 tablet by mouth daily at 12 noon.      omeprazole (PRILOSEC) 40 MG capsule Take 40 mg by mouth daily.     triamcinolone cream (KENALOG) 0.1 % Apply 1 application topically daily as needed (psoriasis). Mixed with nystatin cream 1:1     No current facility-administered medications for this visit.    Allergies as of 07/16/2022 - Review Complete 07/16/2022  Allergen Reaction Noted   Sulfa antibiotics Rash 01/30/2013    Family History  Problem Relation Age of Onset   Heart disease Father     Social History   Socioeconomic History   Marital status: Widowed    Spouse name: Not on file   Number of children: Not on file   Years of education: Not on file   Highest education level: Not on file  Occupational History   Not on file  Tobacco Use   Smoking status: Never   Smokeless tobacco: Never  Vaping  Use   Vaping Use: Never used  Substance and Sexual Activity   Alcohol use: No    Alcohol/week: 0.0 standard drinks of alcohol   Drug use: No   Sexual activity: Never  Other Topics Concern   Not on file  Social History Narrative   Not on file   Social Determinants of Health   Financial Resource Strain: Not on file  Food Insecurity: Not on file  Transportation Needs: Not on file  Physical Activity: Not on file  Stress: Not on file  Social Connections: Not on file   Review of systems General: negative for malaise, night sweats, fever, chills, weight loss Neck: Negative for lumps, goiter, pain and significant neck swelling Resp: Negative  for cough, wheezing, dyspnea at rest CV: Negative for chest pain, leg swelling, palpitations, orthopnea GI: denies melena, hematochezia, nausea, vomiting, diarrhea, constipation, dysphagia, odyonophagia, early satiety or unintentional weight loss.  MSK: Negative for joint pain or swelling, back pain, and muscle pain. Derm: Negative for itching or rash Psych: Denies depression, anxiety, memory loss, confusion. No homicidal or suicidal ideation.  Heme: Negative for prolonged bleeding, bruising easily, and swollen nodes. Endocrine: Negative for cold or heat intolerance, polyuria, polydipsia and goiter. Neuro: negative for tremor, gait imbalance, syncope and seizures. The remainder of the review of systems is noncontributory.  Physical Exam: Temp (!) 97.4 F (36.3 C) (Oral)   Ht 5' (1.524 m)   Wt 144 lb 3.2 oz (65.4 kg)   BMI 28.16 kg/m  General:   Alert and oriented. No distress noted. Pleasant and cooperative.  Head:  Normocephalic and atraumatic. Eyes:  Conjuctiva clear without scleral icterus. Mouth:  Oral mucosa pink and moist. Good dentition. No lesions. Heart: Normal rate and rhythm, s1 and s2 heart sounds present.  Lungs: Clear lung sounds in all lobes. Respirations equal and unlabored. Abdomen:  +BS, soft, non-tender and  non-distended. No rebound or guarding. No HSM or masses noted. Derm: No palmar erythema or jaundice Msk:  Symmetrical without gross deformities. Normal posture. Extremities:  Without edema. Neurologic:  Alert and  oriented x4 Psych:  Alert and cooperative. Normal mood and affect.  Invalid input(s): "6 MONTHS"   ASSESSMENT: Amy [name] is a 77 y.o. female presenting today as a new patient for enlarged spleen, also with intermittently elevated bilirubin  Enlarged spleen noted on [place] in October and again on CT imaging in November. Notably, liver contour on [place] was of normal texture, no indications of underlying liver disease. Her LFTs have remained normal other than intermittently, mild elevation of T bilirubin, query Gilbert's syndrome, though in presence of enlarged spleen, could consider spherocytosis as a differential. Given no findings to suggest splenomegaly is secondary to liver disease, recommend referral to hematology for further evaluation as etiology is unclear. In the meantime, Will fractionate bilirubin for further determination of possible Gilbert's syndrome.  There was a subcentimeter hypodensity noted on the liver on CT imaging in November 2023. Unclear etiology, suspect this is a cyst vs hemangioma, will update liver US to re-evaluate/follow for any changes of this lesion.  PLAN:  Will fractionate Bilirubin  2. Referral to hematology 3. Repeat Liver US  All questions were answered, patient verbalized understanding and is in agreement with plan as outlined above.   Follow Up: TBD   Abbigale Mcelhaney L. Jeanmarie Hubert, MSN, APRN, AGNP-C Adult-Gerontology Nurse Practitioner [facility]  I have reviewed the note and agree with the APP's assessment as described in this progress note  Katrinka Blazing, MD Gastroenterology and Hepatology [facility]

## 2022-07-17 ENCOUNTER — Telehealth (INDEPENDENT_AMBULATORY_CARE_PROVIDER_SITE_OTHER): Payer: Self-pay | Admitting: *Deleted

## 2022-07-17 ENCOUNTER — Encounter: Payer: Self-pay | Admitting: Internal Medicine

## 2022-07-17 DIAGNOSIS — K219 Gastro-esophageal reflux disease without esophagitis: Secondary | ICD-10-CM | POA: Insufficient documentation

## 2022-07-17 DIAGNOSIS — L409 Psoriasis, unspecified: Secondary | ICD-10-CM | POA: Insufficient documentation

## 2022-07-17 DIAGNOSIS — E785 Hyperlipidemia, unspecified: Secondary | ICD-10-CM | POA: Insufficient documentation

## 2022-07-17 DIAGNOSIS — L900 Lichen sclerosus et atrophicus: Secondary | ICD-10-CM | POA: Insufficient documentation

## 2022-07-17 DIAGNOSIS — E039 Hypothyroidism, unspecified: Secondary | ICD-10-CM | POA: Insufficient documentation

## 2022-07-17 DIAGNOSIS — M8580 Other specified disorders of bone density and structure, unspecified site: Secondary | ICD-10-CM | POA: Insufficient documentation

## 2022-07-17 DIAGNOSIS — I10 Essential (primary) hypertension: Secondary | ICD-10-CM | POA: Insufficient documentation

## 2022-07-17 DIAGNOSIS — I2510 Atherosclerotic heart disease of native coronary artery without angina pectoris: Secondary | ICD-10-CM | POA: Insufficient documentation

## 2022-07-17 LAB — BILIRUBIN, FRACTIONATED(TOT/DIR/INDIR)
Bilirubin Total: 1.1 mg/dL (ref 0.0–1.2)
Bilirubin, Direct: 0.29 mg/dL (ref 0.00–0.40)
Bilirubin, Indirect: 0.81 mg/dL — ABNORMAL HIGH (ref 0.10–0.80)

## 2022-07-17 NOTE — Assessment & Plan Note (Signed)
No plaques appreciated on exam today.  She is prescribed betamethasone for as needed application.

## 2022-07-17 NOTE — Assessment & Plan Note (Signed)
Currently prescribed atorvastatin 20 mg daily.  She reports that labs were recently updated by her former PCP.  Records have been requested today.

## 2022-07-17 NOTE — Telephone Encounter (Signed)
I rec'd this from hematology/oncology:  Amy [name], The navigator has reviewed this referral and we will need current labs on her to process the referral.  Please order labs and refer her back to [place] after you all have doen the labs.   Thanks Amy Nance

## 2022-07-17 NOTE — Assessment & Plan Note (Signed)
History of CAD s/p DES x 2 in 2015.  She is currently on DAPT (ASA/Plavix) and atorvastatin.  Cardiology follow-up scheduled for later this month.

## 2022-07-17 NOTE — Assessment & Plan Note (Signed)
S/p pacemaker implantation in June 2018.  Followed by cardiology (Dr. Ladona Ridgel).  Cardiology follow-up scheduled for 6/25.

## 2022-07-17 NOTE — Assessment & Plan Note (Signed)
Her blood pressure is significantly elevated today, 197/71.  She is currently prescribed lisinopril 5 mg twice daily, HCTZ 12.5 mg daily, and Imdur 15 mg daily.  She checks her blood pressure regularly at home and reports that BP is typically well-controlled. -Increase Imdur to 30 mg daily -Follow-up in 2-4 weeks for HTN check

## 2022-07-17 NOTE — Assessment & Plan Note (Signed)
She endorses a history of osteopenia and is currently on vitamin D + calcium supplementation.  Reports that she is due for repeat DEXA in August.

## 2022-07-17 NOTE — Assessment & Plan Note (Signed)
Symptoms are adequately controlled with omeprazole.  No medication changes are indicated today.

## 2022-07-17 NOTE — Assessment & Plan Note (Signed)
She reports a history of hypothyroidism.  Etiology unclear.  She is currently prescribed levothyroxine 75 mcg daily.  Labs recently updated.  Records from her previous PCP have been requested today.

## 2022-07-17 NOTE — Assessment & Plan Note (Signed)
Noted on recent imaging.  MRI scheduled but not completed due to pacemaker incompatibility.  She is scheduled to see gastroenterology next week (6/11).

## 2022-07-17 NOTE — Assessment & Plan Note (Signed)
She is prescribed Temovate and Kenalog for as needed application

## 2022-07-18 ENCOUNTER — Encounter: Payer: Self-pay | Admitting: *Deleted

## 2022-07-18 NOTE — Telephone Encounter (Signed)
I sent Amy a message asking her what was needed

## 2022-07-18 NOTE — Telephone Encounter (Signed)
Therese Sarah, RN  Nance, Sondra Barges I do not see any labs from her PCP in April. Do you guys have a copy of them?  Otherwise, a CBCd and CMP is good just to see if she has any lab changes related to her splenomegaly.  Thanks!

## 2022-07-22 NOTE — Telephone Encounter (Signed)
Called patient and she states she took labs to Dr. Durwin Nora that was done at [facility] on 4/15. I called [facility] and left a message for medical records to fax [place] results.

## 2022-07-23 NOTE — Telephone Encounter (Signed)
Received cbc and cmet from pcp collected on 05/21/22 to send with referral.

## 2022-07-23 NOTE — Telephone Encounter (Signed)
Referral sent back to them

## 2022-07-25 ENCOUNTER — Ambulatory Visit: Payer: Medicare Other

## 2022-07-25 DIAGNOSIS — I495 Sick sinus syndrome: Secondary | ICD-10-CM | POA: Diagnosis not present

## 2022-07-30 ENCOUNTER — Ambulatory Visit: Payer: Medicare Other | Attending: Internal Medicine | Admitting: Internal Medicine

## 2022-07-30 ENCOUNTER — Encounter: Payer: Self-pay | Admitting: Internal Medicine

## 2022-07-30 VITALS — BP 180/56 | HR 60 | Ht 60.0 in | Wt 140.0 lb

## 2022-07-30 DIAGNOSIS — I480 Paroxysmal atrial fibrillation: Secondary | ICD-10-CM | POA: Insufficient documentation

## 2022-07-30 LAB — CUP PACEART INCLINIC DEVICE CHECK
Battery Remaining Longevity: 54 mo
Brady Statistic RA Percent Paced: 86 %
Brady Statistic RV Percent Paced: 0 %
Date Time Interrogation Session: 20240625172034
Implantable Lead Connection Status: 753985
Implantable Lead Connection Status: 753985
Implantable Lead Implant Date: 20180626
Implantable Lead Implant Date: 20180626
Implantable Lead Location: 753859
Implantable Lead Location: 753860
Implantable Lead Model: 377
Implantable Lead Model: 377
Implantable Lead Serial Number: 49791993
Implantable Lead Serial Number: 49865965
Implantable Pulse Generator Implant Date: 20180626
Lead Channel Impedance Value: 487 Ohm
Lead Channel Impedance Value: 487 Ohm
Lead Channel Pacing Threshold Amplitude: 0.5 V
Lead Channel Pacing Threshold Amplitude: 1.2 V
Lead Channel Pacing Threshold Pulse Width: 0.4 ms
Lead Channel Pacing Threshold Pulse Width: 0.4 ms
Lead Channel Sensing Intrinsic Amplitude: 11.7 mV
Lead Channel Sensing Intrinsic Amplitude: 3.5 mV
Pulse Gen Model: 407145
Pulse Gen Serial Number: 69052081

## 2022-07-30 NOTE — Patient Instructions (Signed)
Medication Instructions:  Your physician recommends that you continue on your current medications as directed. Please refer to the Current Medication list given to you today.   Labwork: None today  Testing/Procedures: None today  Follow-Up: 1 year  Any Other Special Instructions Will Be Listed Below (If Applicable).  If you need a refill on your cardiac medications before your next appointment, please call your pharmacy.  

## 2022-07-30 NOTE — Progress Notes (Signed)
HPI Amy [name] returns today for followup. She is a pleasant 77 yo woman with a h/o sinus node dysfunction, pAF, s/p PPM insertion. She notes that her bp is better at home. She denies chest pain. She is s/p PCI/Stent over 6 years ago. She has minimal leg swelling. She has not lost weight despite exercise.  Allergies  Allergen Reactions   Sulfa Antibiotics Rash     Current Outpatient Medications  Medication Sig Dispense Refill   acetaminophen (TYLENOL) 650 MG CR tablet Take 650 mg by mouth daily as needed for pain.     aspirin 81 MG tablet Take 81 mg by mouth daily.     atorvastatin (LIPITOR) 20 MG tablet Take 20 mg by mouth at bedtime.   3   calcium carbonate (OSCAL) 1500 (600 Ca) MG TABS tablet Take by mouth 2 (two) times daily with a meal.     Cholecalciferol (VITAMIN D3) 2000 units capsule Take 2,000 Units by mouth daily at 12 noon.      clobetasol (TEMOVATE) 0.05 % external solution Apply 1 application topically daily as needed (psoriasis).      clopidogrel (PLAVIX) 75 MG tablet TAKE 1 TABLET BY MOUTH DAILY 90 tablet 3   hydrochlorothiazide (HYDRODIURIL) 12.5 MG tablet Take 12.5 mg by mouth daily.     ipratropium (ATROVENT) 0.03 % nasal spray Place 2 sprays into both nostrils 3 (three) times daily. 30 mL 5   isosorbide mononitrate (IMDUR) 30 MG 24 hr tablet Take 1 tablet (30 mg total) by mouth daily. 30 tablet 2   levothyroxine (SYNTHROID) 75 MCG tablet Take 75 mcg by mouth every morning.     lisinopril (ZESTRIL) 5 MG tablet Take 5 mg by mouth 2 (two) times daily.     Multiple Vitamin (MULTIVITAMIN WITH MINERALS) TABS tablet Take 1 tablet by mouth daily at 12 noon.      omeprazole (PRILOSEC) 40 MG capsule Take 40 mg by mouth daily.     triamcinolone cream (KENALOG) 0.1 % Apply 1 application topically daily as needed (psoriasis). Mixed with nystatin cream 1:1     No current facility-administered medications for this visit.     Past Medical History:  Diagnosis Date    Asthma    CAD (coronary artery disease)    60-70% proximal LAD/diagonal bifurcational disease October 2015 at [facility] status DES x 2 to LAD/diagonal October 2015   Essential hypertension    Hyperlipidemia    Hypothyroidism    Sinus node dysfunction (HCC)    Biotronik pacemaker in place    ROS:   All systems reviewed and negative except as noted in the HPI.   Past Surgical History:  Procedure Laterality Date   ABDOMINAL HYSTERECTOMY     ANAL FISSURE REPAIR     CATARACT EXTRACTION W/PHACO Right 10/17/2014   Procedure: CATARACT EXTRACTION PHACO AND INTRAOCULAR LENS PLACEMENT (IOC);  Surgeon: Amy Payor, MD;  Location: [facility];  Service: Ophthalmology;  Laterality: Right;  CDE: 4.88   CATARACT EXTRACTION W/PHACO Left 10/27/2014   Procedure: CATARACT EXTRACTION PHACO AND INTRAOCULAR LENS PLACEMENT (IOC);  Surgeon: Amy Payor, MD;  Location: [facility];  Service: Ophthalmology;  Laterality: Left;  CDE:7.86   COLONOSCOPY N/A 04/16/2018   Procedure: COLONOSCOPY;  Surgeon: Amy Hippo, MD;  Location: [facility];  Service: Endoscopy;  Laterality: N/A;  830   FINGER SURGERY     NASAL SINUS SURGERY     PACEMAKER IMPLANT N/A 07/30/2016   Procedure: Pacemaker Implant  Dual Chamber;  Surgeon: Amy Maw, MD;  Location: [facility] INVASIVE CV LAB;  Service: Cardiovascular;  Laterality: N/A;   TONSILLECTOMY       Family History  Problem Relation Age of Onset   Heart disease Father      Social History   Socioeconomic History   Marital status: Widowed    Spouse name: Not on file   Number of children: Not on file   Years of education: Not on file   Highest education level: Not on file  Occupational History   Not on file  Tobacco Use   Smoking status: Never   Smokeless tobacco: Never  Vaping Use   Vaping Use: Never used  Substance and Sexual Activity   Alcohol use: No    Alcohol/week: 0.0 standard drinks of alcohol   Drug use: No   Sexual activity: Never  Other Topics Concern   Not  on file  Social History Narrative   Not on file   Social Determinants of Health   Financial Resource Strain: Not on file  Food Insecurity: Not on file  Transportation Needs: Not on file  Physical Activity: Not on file  Stress: Not on file  Social Connections: Not on file  Intimate Partner Violence: Not on file     BP (!) 180/56   Pulse 60   Ht 5' (1.524 m)   Wt 140 lb (63.5 kg)   SpO2 96%   BMI 27.34 kg/m  BP - 160/75 on my check Physical Exam:  Well appearing NAD HEENT: Unremarkable Neck:  No JVD, no thyromegally Lymphatics:  No adenopathy Back:  No CVA tenderness Lungs:  Clear with no wheezes HEART:  Regular rate rhythm, no murmurs, no rubs, no clicks Abd:  soft, positive bowel sounds, no organomegally, no rebound, no guarding Ext:  2 plus pulses, no edema, no cyanosis, no clubbing Skin:  No rashes no nodules Neuro:  CN II through XII intact, motor grossly intact  DEVICE  Normal device function.  See PaceArt for details.   Assess/Plan:  Sinus node dysfunction - she is asymptomatic, s/p PPM insertion. 2. PPM - her biotronik DDD PPM is working normally. We will recheck in several months. 3. HTN - her bp is elevated but better at home. 4. Dyslipidemia - she has tolerated her statin therapy with no difficulty.   Amy [name].D.

## 2022-08-01 ENCOUNTER — Ambulatory Visit (INDEPENDENT_AMBULATORY_CARE_PROVIDER_SITE_OTHER): Payer: Medicare Other | Admitting: Internal Medicine

## 2022-08-01 ENCOUNTER — Encounter: Payer: Self-pay | Admitting: Internal Medicine

## 2022-08-01 VITALS — BP 174/71 | HR 66 | Ht 60.0 in | Wt 144.2 lb

## 2022-08-01 DIAGNOSIS — I10 Essential (primary) hypertension: Secondary | ICD-10-CM | POA: Diagnosis not present

## 2022-08-01 DIAGNOSIS — R161 Splenomegaly, not elsewhere classified: Secondary | ICD-10-CM

## 2022-08-01 MED ORDER — LISINOPRIL 10 MG PO TABS
10.0000 mg | ORAL_TABLET | Freq: Two times a day (BID) | ORAL | 2 refills | Status: AC
Start: 2022-08-01 — End: 2022-10-30

## 2022-08-01 NOTE — Assessment & Plan Note (Signed)
Presenting today for HTN follow-up.  Imdur was increased to 30 mg daily at her last appointment.  She is additionally prescribed lisinopril 5 mg twice daily and HCTZ 12.5 mg daily.  BP remains significantly elevated today.  179/59 initially and 174/71 on repeat.  She has been checking her blood pressure at home.  Home readings are mildly elevated, but overall consistently lower than readings in office today. -Increase lisinopril to 10 mg twice daily -Nurse visit for BP check in 1-2 weeks -She will return to care for routine follow-up with me in 3 months

## 2022-08-01 NOTE — Patient Instructions (Signed)
It was a pleasure to see you today.  Thank you for giving [place] the opportunity to be involved in your care.  Below is a brief recap of your visit and next steps.  We will plan to see you again in 3 months.  Summary Increase lisinopril to 10 mg twice daily Nurse visit for BP check in 1-2 weeks Follow up for routine care in 3 months

## 2022-08-01 NOTE — Progress Notes (Signed)
Established Patient Office Visit  Subjective   Patient ID: Amy [name], female    DOB: 1945/11/21  Age: 77 y.o. MRN: [id number]  Chief Complaint  Patient presents with   Hypertension    Follow up   Amy [name] returns to care today for HTN follow-up.  She was last evaluated by me on 6/6 as a new patient presenting to establish care.  Her blood pressure was significantly elevated at that time.  Imdur was increased to 30 mg daily.  4-week follow-up was arranged for HTN check.  In the interim she has been evaluated by cardiology (Dr. Ladona Ridgel) and gastroenterology.  There have otherwise been no acute interval events.  Amy [name] reports feeling well today.  She is asymptomatic and has no acute concerns to discuss.  Past Medical History:  Diagnosis Date   Asthma    CAD (coronary artery disease)    60-70% proximal LAD/diagonal bifurcational disease October 2015 at [facility] status DES x 2 to LAD/diagonal October 2015   Essential hypertension    Hyperlipidemia    Hypothyroidism    Sinus node dysfunction (HCC)    Biotronik pacemaker in place   Past Surgical History:  Procedure Laterality Date   ABDOMINAL HYSTERECTOMY     ANAL FISSURE REPAIR     CATARACT EXTRACTION W/PHACO Right 10/17/2014   Procedure: CATARACT EXTRACTION PHACO AND INTRAOCULAR LENS PLACEMENT (IOC);  Surgeon: Gemma Payor, MD;  Location: [facility];  Service: Ophthalmology;  Laterality: Right;  CDE: 4.88   CATARACT EXTRACTION W/PHACO Left 10/27/2014   Procedure: CATARACT EXTRACTION PHACO AND INTRAOCULAR LENS PLACEMENT (IOC);  Surgeon: Gemma Payor, MD;  Location: [facility];  Service: Ophthalmology;  Laterality: Left;  CDE:7.86   COLONOSCOPY N/A 04/16/2018   Procedure: COLONOSCOPY;  Surgeon: Malissa Hippo, MD;  Location: [facility];  Service: Endoscopy;  Laterality: N/A;  830   FINGER SURGERY     NASAL SINUS SURGERY     PACEMAKER IMPLANT N/A 07/30/2016   Procedure: Pacemaker Implant Dual Chamber;  Surgeon: Marinus Maw, MD;   Location: [facility] INVASIVE CV LAB;  Service: Cardiovascular;  Laterality: N/A;   TONSILLECTOMY     Social History   Tobacco Use   Smoking status: Never   Smokeless tobacco: Never  Vaping Use   Vaping Use: Never used  Substance Use Topics   Alcohol use: No    Alcohol/week: 0.0 standard drinks of alcohol   Drug use: No   Family History  Problem Relation Age of Onset   Heart disease Father    Allergies  Allergen Reactions   Sulfa Antibiotics Rash      Review of Systems  Constitutional:  Negative for chills and fever.  HENT:  Negative for sore throat.   Respiratory:  Negative for cough and shortness of breath.   Cardiovascular:  Negative for chest pain, palpitations and leg swelling.  Gastrointestinal:  Negative for abdominal pain, blood in stool, constipation, diarrhea, nausea and vomiting.  Genitourinary:  Negative for dysuria and hematuria.  Musculoskeletal:  Negative for myalgias.  Skin:  Negative for itching and rash.  Neurological:  Negative for dizziness and headaches.  Psychiatric/Behavioral:  Negative for depression and suicidal ideas.       Objective:     BP (!) 174/71   Pulse 66   Ht 5' (1.524 m)   Wt 144 lb 3.2 oz (65.4 kg)   SpO2 98%   BMI 28.16 kg/m  BP Readings from Last 3 Encounters:  08/01/22 (!) 174/71  07/30/22 (!) 180/56  07/16/22 (!) 148/80      Physical Exam Vitals reviewed.  Constitutional:      General: She is not in acute distress.    Appearance: Normal appearance. She is not toxic-appearing.  HENT:     Head: Normocephalic and atraumatic.     Right Ear: External ear normal.     Left Ear: External ear normal.     Nose: Nose normal. No congestion or rhinorrhea.     Mouth/Throat:     Mouth: Mucous membranes are moist.     Pharynx: Oropharynx is clear. No oropharyngeal exudate or posterior oropharyngeal erythema.  Eyes:     General: No scleral icterus.    Extraocular Movements: Extraocular movements intact.     Conjunctiva/sclera:  Conjunctivae normal.     Pupils: Pupils are equal, round, and reactive to light.  Cardiovascular:     Rate and Rhythm: Normal rate and regular rhythm.     Pulses: Normal pulses.     Heart sounds: Murmur heard.     No friction rub. No gallop.  Pulmonary:     Effort: Pulmonary effort is normal.     Breath sounds: Normal breath sounds. No wheezing, rhonchi or rales.  Abdominal:     General: Abdomen is flat. Bowel sounds are normal. There is no distension.     Palpations: Abdomen is soft.     Tenderness: There is no abdominal tenderness.  Musculoskeletal:        General: No swelling. Normal range of motion.     Cervical back: Normal range of motion.     Right lower leg: No edema.     Left lower leg: No edema.  Lymphadenopathy:     Cervical: No cervical adenopathy.  Skin:    General: Skin is warm and dry.     Capillary Refill: Capillary refill takes less than 2 seconds.     Coloration: Skin is not jaundiced.  Neurological:     General: No focal deficit present.     Mental Status: She is alert and oriented to person, place, and time.  Psychiatric:        Mood and Affect: Mood normal.        Behavior: Behavior normal.   Last CBC Lab Results  Component Value Date   WBC 15.4 (H) 06/15/2020   HGB 14.2 06/15/2020   HCT 41.9 06/15/2020   MCV 88.6 06/15/2020   MCH 30.0 06/15/2020   RDW 13.1 06/15/2020   PLT 173 06/15/2020   Last metabolic panel Lab Results  Component Value Date   GLUCOSE 151 (H) 06/15/2020   NA 127 (L) 06/15/2020   K 3.8 06/15/2020   CL 92 (L) 06/15/2020   CO2 25 06/15/2020   BUN 29 (H) 06/15/2020   CREATININE 1.04 (H) 06/15/2020   EGFR 51.5 05/20/2022   CALCIUM 9.0 06/15/2020   PROT 7.8 06/15/2020   ALBUMIN 4.4 06/15/2020   BILITOT 1.1 07/16/2022   ALKPHOS 109 06/15/2020   AST 29 06/15/2020   ALT 29 06/15/2020   ANIONGAP 10 06/15/2020     Assessment & Plan:   Problem List Items Addressed This Visit       Essential hypertension - Primary     Presenting today for HTN follow-up.  Imdur was increased to 30 mg daily at her last appointment.  She is additionally prescribed lisinopril 5 mg twice daily and HCTZ 12.5 mg daily.  BP remains significantly elevated today.  179/59 initially and 174/71 on repeat.  She has been checking her blood pressure at home.  Home readings are mildly elevated, but overall consistently lower than readings in office today. -Increase lisinopril to 10 mg twice daily -Nurse visit for BP check in 1-2 weeks -She will return to care for routine follow-up with me in 3 months      Splenomegaly    Noted on recent imaging.  She was evaluated by gastroenterology on 6/11.  Referred to hematology and US liver ordered.  Hematology appointment is scheduled for 7/12.       Return in about 3 months (around 11/01/2022).    Billie Lade, MD

## 2022-08-01 NOTE — Assessment & Plan Note (Addendum)
Noted on recent imaging.  She was evaluated by gastroenterology on 6/11.  Referred to hematology and US liver ordered.  Hematology appointment is scheduled for 7/12.

## 2022-08-02 ENCOUNTER — Ambulatory Visit (HOSPITAL_COMMUNITY)
Admission: RE | Admit: 2022-08-02 | Discharge: 2022-08-02 | Disposition: A | Discharge status: Home / Self Care | Payer: Medicare Other | Source: Ambulatory Visit | Attending: Gastroenterology | Admitting: Gastroenterology

## 2022-08-02 ENCOUNTER — Telehealth: Payer: Self-pay | Admitting: Internal Medicine

## 2022-08-02 DIAGNOSIS — K769 Liver disease, unspecified: Secondary | ICD-10-CM | POA: Diagnosis not present

## 2022-08-02 NOTE — Telephone Encounter (Signed)
Papers were given to front desk by nurse laid on front desk note patient to sign forms  Disability Forms   Forms given by nurse in the completed folder at front desk, for patient to sign.

## 2022-08-05 LAB — CUP PACEART REMOTE DEVICE CHECK
Date Time Interrogation Session: 20240628124832
Implantable Lead Connection Status: 753985
Implantable Lead Connection Status: 753985
Implantable Lead Implant Date: 20180626
Implantable Lead Implant Date: 20180626
Implantable Lead Location: 753859
Implantable Lead Location: 753860
Implantable Lead Model: 377
Implantable Lead Model: 377
Implantable Lead Serial Number: 49791993
Implantable Lead Serial Number: 49865965
Implantable Pulse Generator Implant Date: 20180626
Pulse Gen Model: 407145
Pulse Gen Serial Number: 69052081

## 2022-08-06 ENCOUNTER — Telehealth (INDEPENDENT_AMBULATORY_CARE_PROVIDER_SITE_OTHER): Payer: Self-pay | Admitting: *Deleted

## 2022-08-06 NOTE — Telephone Encounter (Signed)
Copied from result so I could document on:   Raquel James, NP  Metro Kung, LPN Please let the patient know, no masses were seen on [place]. No further evaluation necessary at this time. Can we find out if she is able to bring copies of her most recent labs work? I referred her to hematology but they are requesting labs prior to scheduling her.  Left message to return call with pt to discuss [place] results. ( Pt has appt with hematology, labs from 4/16 were sent on 07/23/22 ) .

## 2022-08-09 NOTE — Telephone Encounter (Signed)
Discussed with patient

## 2022-08-14 NOTE — Progress Notes (Signed)
Remote pacemaker transmission.   

## 2022-08-15 ENCOUNTER — Ambulatory Visit: Payer: Medicare Other

## 2022-08-15 NOTE — Progress Notes (Signed)
[facility] [address], [place] [address]   Clinic Day:  08/16/22   Referring physician: Billie Lade, MD  Patient Care Team: Billie Lade, MD as PCP - General (Internal Medicine) Jonelle Sidle, MD as PCP - Cardiology (Cardiology) Marinus Maw, MD as PCP - Electrophysiology (Cardiology) Center, [facility] as Attending Physician Doreatha Massed, MD as Medical Oncologist (Hematology)   ASSESSMENT & PLAN:   Assessment:  1.  Splenomegaly: - Patient seen at the request of Dr. Durwin Nora for splenomegaly. - Ultrasound (11/2021): Suggestive of splenomegaly. - CT abdomen (12/10/2021): Splenomegaly 18 cm in craniocaudal dimension.  Subcentimeter hypodensity in segment 4 poorly visualized. - Ultrasound RUQ (07/25/2022): No liver mass visualized. - Denies any B symptoms or recurrent infections.  2.  Social/family history: - She lives by herself and is independent of ADLs and IADLs.  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  She now works at [employer] for [employer].  Non-smoker.  No chemical exposure. - No family history of hepatosplenomegaly.  Maternal great grandmother had ovarian cancer.  Plan:  1.  Splenomegaly: - We discussed different etiologies of splenomegaly. - Will check for infectious diseases, and myeloproliferative neoplasms. - Will plan on repeating CTAP with contrast. - If the splenomegaly persists without improvement in size, will consider PET CT scan to evaluate for indolent lymphoma.  Orders Placed This Encounter  Procedures   CT ABDOMEN PELVIS W CONTRAST    Standing Status:   Future    Standing Expiration Date:   08/16/2023    Order Specific Question:   If indicated for the ordered procedure, I authorize the administration of contrast media per Radiology protocol    Answer:   Yes    Order Specific Question:   Does the patient have a contrast media/X-ray dye allergy?    Answer:   No    Order Specific Question:    Preferred imaging location?    Answer:   [facility]    Order Specific Question:   Release to patient    Answer:   Immediate [1]    Order Specific Question:   If indicated for the ordered procedure, I authorize the administration of oral contrast media per Radiology protocol    Answer:   Yes   CBC with Differential    Standing Status:   Future    Number of Occurrences:   1    Standing Expiration Date:   08/16/2023   Lactate dehydrogenase    Standing Status:   Future    Number of Occurrences:   1    Standing Expiration Date:   08/16/2023   Reticulocytes    Standing Status:   Future    Number of Occurrences:   1    Standing Expiration Date:   08/16/2023   Haptoglobin    Standing Status:   Future    Number of Occurrences:   1    Standing Expiration Date:   08/16/2023   JAK2 V617F rfx CALR/MPL/E12-15    Standing Status:   Future    Number of Occurrences:   1    Standing Expiration Date:   08/16/2023   EBV ab to viral capsid ag pnl, IgG+IgM    Standing Status:   Future    Number of Occurrences:   1    Standing Expiration Date:   08/16/2023   Cmv antibody, IgG (EIA)    Standing Status:   Future    Number of  Occurrences:   1    Standing Expiration Date:   08/16/2023   Hepatitis B core antibody, total    Standing Status:   Future    Number of Occurrences:   1    Standing Expiration Date:   08/16/2023   Hepatitis B surface antigen    Standing Status:   Future    Number of Occurrences:   1    Standing Expiration Date:   08/16/2023   Hepatitis B surface antibody    Standing Status:   Future    Number of Occurrences:   1    Standing Expiration Date:   08/16/2023   Hepatitis C Antibody    Standing Status:   Future    Number of Occurrences:   1    Standing Expiration Date:   08/16/2023   Technologist smear review    Standing Status:   Future    Number of Occurrences:   1    Standing Expiration Date:   08/16/2023    Order Specific Question:   Clinical information:    Answer:    splenomegaly    Order Specific Question:   Release to patient    Answer:   Immediate    Order Specific Question:   Remote health to draw?    Answer:   Yes    Order Specific Question:   Release to patient    Answer:   Immediate [1]      I,Helena R Teague,acting as a scribe for Doreatha Massed, MD.,have documented all relevant documentation on the behalf of Doreatha Massed, MD,as directed by  Doreatha Massed, MD while in the presence of Doreatha Massed, MD.   I, Doreatha Massed MD, have reviewed the above documentation for accuracy and completeness, and I agree with the above.   Doreatha Massed, MD   7/12/20244:08 PM  CHIEF COMPLAINT/PURPOSE OF CONSULT:   Diagnosis: Splenomegaly   Cancer Staging  No matching staging information was found for the patient.    Prior Therapy: None  Current Therapy: Under workup   HISTORY OF PRESENT ILLNESS:   Amy [name] is a 77 y.o. female presenting to clinic today for evaluation of an enlarged spleen at the request of Dr. Levon Hedger.  Patient had an [place] in October 2023 that showed an 18 cm spleen.   Patient underwent an US of the abdomen limited to the RUQ on 6/28 that showed the known mass in the hepatic dome is not visualized on today's study. The mass is low suspicion on CT imaging.  Her recent CBC on 05/21/22 showed abnormal ALC at 13, L at 26%, and G at 66%.   Today, she states that she is doing well overall. Her appetite level is at 100%. Her energy level is at 80%. She is accompanied by her daughter.   She reports she has had multiple scans that showed an enlarged spleen. Patient reports she has a pacemaker, so she could not have an MRI.   She notes she had anemia and a hysterectomy. Her mother had anemia. She notes her maternal great-great grandmother had ovarian cancer. She denies family history of hereditary splenocytosis. She reports maternal and paternal family members have died from heart diseases or strokes.    She reports of feeling full after eating and bloating. She denies fever, night sweats, weight loss, recurrent infections. She denies ever having infectious mononucleosis.   She had a stomach virus which caused a high fever over 2 years ago. She notes she fell on a metal  trash can 4 years ago to her right side. She had to receive physical therapy and could not walk at first. She reportedly broke her ribs.   She currently works at [employer] for [employer]. She used to do office work. She is able to do activities around the house without assistance. She denies any chemical exposure, smoking, or consuming alcohol.   PAST MEDICAL HISTORY:   Past Medical History: Past Medical History:  Diagnosis Date   Asthma    CAD (coronary artery disease)    60-70% proximal LAD/diagonal bifurcational disease October 2015 at [facility] status DES x 2 to LAD/diagonal October 2015   Essential hypertension    Hyperlipidemia    Hypothyroidism    Sinus node dysfunction (HCC)    Biotronik pacemaker in place    Surgical History: Past Surgical History:  Procedure Laterality Date   ABDOMINAL HYSTERECTOMY     ANAL FISSURE REPAIR     CATARACT EXTRACTION W/PHACO Right 10/17/2014   Procedure: CATARACT EXTRACTION PHACO AND INTRAOCULAR LENS PLACEMENT (IOC);  Surgeon: Gemma Payor, MD;  Location: [facility];  Service: Ophthalmology;  Laterality: Right;  CDE: 4.88   CATARACT EXTRACTION W/PHACO Left 10/27/2014   Procedure: CATARACT EXTRACTION PHACO AND INTRAOCULAR LENS PLACEMENT (IOC);  Surgeon: Gemma Payor, MD;  Location: [facility];  Service: Ophthalmology;  Laterality: Left;  CDE:7.86   COLONOSCOPY N/A 04/16/2018   Procedure: COLONOSCOPY;  Surgeon: Malissa Hippo, MD;  Location: [facility];  Service: Endoscopy;  Laterality: N/A;  830   FINGER SURGERY     NASAL SINUS SURGERY     PACEMAKER IMPLANT N/A 07/30/2016   Procedure: Pacemaker Implant Dual Chamber;  Surgeon: Marinus Maw, MD;  Location: [facility] INVASIVE CV LAB;  Service:  Cardiovascular;  Laterality: N/A;   TONSILLECTOMY      Social History: Social History   Socioeconomic History   Marital status: Widowed    Spouse name: Not on file   Number of children: Not on file   Years of education: Not on file   Highest education level: Not on file  Occupational History   Not on file  Tobacco Use   Smoking status: Never   Smokeless tobacco: Never  Vaping Use   Vaping status: Never Used  Substance and Sexual Activity   Alcohol use: No    Alcohol/week: 0.0 standard drinks of alcohol   Drug use: No   Sexual activity: Never  Other Topics Concern   Not on file  Social History Narrative   Not on file   Social Determinants of Health   Financial Resource Strain: Not on file  Food Insecurity: Not on file  Transportation Needs: Not on file  Physical Activity: Not on file  Stress: Not on file  Social Connections: Not on file  Intimate Partner Violence: Not on file    Family History: Family History  Problem Relation Age of Onset   Heart disease Father     Current Medications:  Current Outpatient Medications:    acetaminophen (TYLENOL) 650 MG CR tablet, Take 650 mg by mouth daily as needed for pain., Disp: , Rfl:    aspirin 81 MG tablet, Take 81 mg by mouth daily., Disp: , Rfl:    atorvastatin (LIPITOR) 20 MG tablet, Take 20 mg by mouth at bedtime. , Disp: , Rfl: 3   calcium carbonate (OSCAL) 1500 (600 Ca) MG TABS tablet, Take by mouth 2 (two) times daily with a meal., Disp: , Rfl:    Cholecalciferol (VITAMIN D3) 2000 units  capsule, Take 2,000 Units by mouth daily at 12 noon. , Disp: , Rfl:    clobetasol (TEMOVATE) 0.05 % external solution, Apply 1 application topically daily as needed (psoriasis). , Disp: , Rfl:    clopidogrel (PLAVIX) 75 MG tablet, TAKE 1 TABLET BY MOUTH DAILY, Disp: 90 tablet, Rfl: 3   hydrochlorothiazide (HYDRODIURIL) 12.5 MG tablet, Take 12.5 mg by mouth daily., Disp: , Rfl:    ipratropium (ATROVENT) 0.03 % nasal spray, Place 2  sprays into both nostrils 3 (three) times daily., Disp: 30 mL, Rfl: 5   isosorbide mononitrate (IMDUR) 30 MG 24 hr tablet, Take 1 tablet (30 mg total) by mouth daily., Disp: 30 tablet, Rfl: 2   levothyroxine (SYNTHROID) 75 MCG tablet, Take 75 mcg by mouth every morning., Disp: , Rfl:    lisinopril (ZESTRIL) 10 MG tablet, Take 1 tablet (10 mg total) by mouth in the morning and at bedtime., Disp: 60 tablet, Rfl: 2   Multiple Vitamin (MULTIVITAMIN WITH MINERALS) TABS tablet, Take 1 tablet by mouth daily at 12 noon. , Disp: , Rfl:    omeprazole (PRILOSEC) 40 MG capsule, Take 40 mg by mouth daily., Disp: , Rfl:    triamcinolone cream (KENALOG) 0.1 %, Apply 1 application topically daily as needed (psoriasis). Mixed with nystatin cream 1:1, Disp: , Rfl:    Allergies: Allergies  Allergen Reactions   Sulfa Antibiotics Rash    REVIEW OF SYSTEMS:   Review of Systems  Constitutional:  Negative for chills, fatigue and fever.  HENT:   Negative for lump/mass, mouth sores, nosebleeds, sore throat and trouble swallowing.   Eyes:  Negative for eye problems.  Respiratory:  Positive for shortness of breath (with exertion). Negative for cough.   Cardiovascular:  Negative for chest pain, leg swelling and palpitations.  Gastrointestinal:  Negative for abdominal pain, constipation, diarrhea, nausea and vomiting.  Genitourinary:  Negative for bladder incontinence, difficulty urinating, dysuria, frequency, hematuria and nocturia.   Musculoskeletal:  Negative for arthralgias, back pain, flank pain, myalgias and neck pain.  Skin:  Negative for itching and rash.  Neurological:  Positive for numbness (tingling in the fingertips). Negative for dizziness and headaches.  Hematological:  Does not bruise/bleed easily.  Psychiatric/Behavioral:  Negative for depression, sleep disturbance and suicidal ideas. The patient is not nervous/anxious.   All other systems reviewed and are negative.    VITALS:   Blood pressure  (!) 187/73, pulse 80, temperature 98 F (36.7 C), temperature source Tympanic, resp. rate 16, height 4\' 11"  (1.499 m), weight 144 lb 12.8 oz (65.7 kg), SpO2 100%.  Wt Readings from Last 3 Encounters:  08/16/22 144 lb 12.8 oz (65.7 kg)  08/01/22 144 lb 3.2 oz (65.4 kg)  07/30/22 140 lb (63.5 kg)    Body mass index is 29.25 kg/m.  Performance status (ECOG): 1 - Symptomatic but completely ambulatory  PHYSICAL EXAM:   Physical Exam Vitals and nursing note reviewed. Exam conducted with a chaperone present.  Constitutional:      Appearance: Normal appearance.  Cardiovascular:     Rate and Rhythm: Normal rate and regular rhythm.     Pulses: Normal pulses.     Heart sounds: Normal heart sounds.  Pulmonary:     Effort: Pulmonary effort is normal.     Breath sounds: Normal breath sounds.  Abdominal:     Palpations: Abdomen is soft. There is splenomegaly. There is no hepatomegaly or mass.     Tenderness: There is no abdominal tenderness.  Musculoskeletal:  Right lower leg: No edema.     Left lower leg: No edema.  Lymphadenopathy:     Cervical: No cervical adenopathy.     Right cervical: No superficial, deep or posterior cervical adenopathy.    Left cervical: No superficial, deep or posterior cervical adenopathy.     Upper Body:     Right upper body: No supraclavicular or axillary adenopathy.     Left upper body: No supraclavicular or axillary adenopathy.  Neurological:     General: No focal deficit present.     Mental Status: She is alert and oriented to person, place, and time.  Psychiatric:        Mood and Affect: Mood normal.        Behavior: Behavior normal.     LABS:      Latest Ref Rng & Units 08/16/2022   10:36 AM 06/15/2020    9:48 PM 07/26/2016    9:08 AM  CBC  WBC 4.0 - 10.5 K/uL 6.1  15.4  5.4   Hemoglobin 12.0 - 15.0 g/dL 21.3  08.6  57.8   Hematocrit 36.0 - 46.0 % 34.6  41.9  36.4   Platelets 150 - 400 K/uL 161  173  192       Latest Ref Rng & Units  07/16/2022    2:12 PM 06/15/2020    9:48 PM 07/26/2016    9:08 AM  CMP  Glucose 70 - 99 mg/dL  469  90   BUN 8 - 23 mg/dL  29  19   Creatinine 6.29 - 1.00 mg/dL  5.28  4.13   Sodium 244 - 145 mmol/L  127  137   Potassium 3.5 - 5.1 mmol/L  3.8  4.7   Chloride 98 - 111 mmol/L  92  99   CO2 22 - 32 mmol/L  25  24   Calcium 8.9 - 10.3 mg/dL  9.0  9.4   Total Protein 6.5 - 8.1 g/dL  7.8    Total Bilirubin 0.0 - 1.2 mg/dL 1.1  1.8    Alkaline Phos 38 - 126 U/L  109    AST 15 - 41 U/L  29    ALT 0 - 44 U/L  29       No results found for: "CEA1", "CEA" / No results found for: "CEA1", "CEA" No results found for: "PSA1" No results found for: "WNU272" No results found for: "CAN125"  No results found for: "TOTALPROTELP", "ALBUMINELP", "A1GS", "A2GS", "BETS", "BETA2SER", "GAMS", "MSPIKE", "SPEI" No results found for: "TIBC", "FERRITIN", "IRONPCTSAT" Lab Results  Component Value Date   LDH 136 08/16/2022     STUDIES:   CUP PACEART REMOTE DEVICE CHECK  Result Date: 08/05/2022 Scheduled remote reviewed. Normal device function.  Next remote 91 days. LA, CVRS  US Abdomen Limited RUQ (LIVER/GB)  Result Date: 08/02/2022 CLINICAL DATA:  Subcentimeter liver lesion on recent CT imaging. EXAM: ULTRASOUND ABDOMEN LIMITED RIGHT UPPER QUADRANT COMPARISON:  None Available. FINDINGS: Gallbladder: No gallstones or wall thickening visualized. No sonographic Murphy sign noted by sonographer. Common bile duct: Diameter: 4.2 mm Liver: The known mass in the hepatic dome is not visualized on today's study. Portal vein is patent on color Doppler imaging with normal direction of blood flow towards the liver. Other: None. IMPRESSION: 1. The known mass in the hepatic dome is not visualized on today's study. The mass is low suspicion on CT imaging. An MRI could further characterize as clinically warranted. 2. No other abnormalities. Electronically Signed  By: Gerome Sam III M.D.   On: 08/02/2022 12:24   CUP  PACEART INCLINIC DEVICE CHECK  Result Date: 07/30/2022 Pacemaker check in clinic. Normal device function. Thresholds, sensing, impedances consistent with previous measurements. Device programmed to maximize longevity. No mode switch episodes.  20 HVR episodes appear 1:1 AT, longest 18 seconds rates 160-194.  Device programmed at appropriate safety margins. Histogram distribution appropriate for patient activity level. Device programmed to optimize intrinsic conduction. Estimated longevity 4 years 6 months. Patient enrolled in remote follow-up. Patient education completed.Syliva Overman, RN

## 2022-08-16 ENCOUNTER — Inpatient Hospital Stay: Payer: Medicare Other

## 2022-08-16 ENCOUNTER — Other Ambulatory Visit: Payer: Self-pay

## 2022-08-16 ENCOUNTER — Inpatient Hospital Stay: Payer: Medicare Other | Attending: Hematology | Admitting: Hematology

## 2022-08-16 VITALS — BP 187/73 | HR 80 | Temp 98.0°F | Resp 16 | Ht 59.0 in | Wt 144.8 lb

## 2022-08-16 DIAGNOSIS — D649 Anemia, unspecified: Secondary | ICD-10-CM | POA: Insufficient documentation

## 2022-08-16 DIAGNOSIS — Z8041 Family history of malignant neoplasm of ovary: Secondary | ICD-10-CM | POA: Insufficient documentation

## 2022-08-16 DIAGNOSIS — Z79899 Other long term (current) drug therapy: Secondary | ICD-10-CM | POA: Diagnosis not present

## 2022-08-16 DIAGNOSIS — Z950 Presence of cardiac pacemaker: Secondary | ICD-10-CM | POA: Diagnosis not present

## 2022-08-16 DIAGNOSIS — Z7982 Long term (current) use of aspirin: Secondary | ICD-10-CM | POA: Diagnosis not present

## 2022-08-16 DIAGNOSIS — R161 Splenomegaly, not elsewhere classified: Secondary | ICD-10-CM | POA: Diagnosis present

## 2022-08-16 LAB — LACTATE DEHYDROGENASE: LDH: 136 U/L (ref 98–192)

## 2022-08-16 LAB — CBC WITH DIFFERENTIAL/PLATELET
Abs Immature Granulocytes: 0.01 10*3/uL (ref 0.00–0.07)
Basophils Absolute: 0 10*3/uL (ref 0.0–0.1)
Basophils Relative: 1 %
Eosinophils Absolute: 0.2 10*3/uL (ref 0.0–0.5)
Eosinophils Relative: 3 %
HCT: 34.6 % — ABNORMAL LOW (ref 36.0–46.0)
Hemoglobin: 11.7 g/dL — ABNORMAL LOW (ref 12.0–15.0)
Immature Granulocytes: 0 %
Lymphocytes Relative: 24 %
Lymphs Abs: 1.5 10*3/uL (ref 0.7–4.0)
MCH: 29.2 pg (ref 26.0–34.0)
MCHC: 33.8 g/dL (ref 30.0–36.0)
MCV: 86.3 fL (ref 80.0–100.0)
Monocytes Absolute: 0.5 10*3/uL (ref 0.1–1.0)
Monocytes Relative: 8 %
Neutro Abs: 3.9 10*3/uL (ref 1.7–7.7)
Neutrophils Relative %: 64 %
Platelets: 161 10*3/uL (ref 150–400)
RBC: 4.01 MIL/uL (ref 3.87–5.11)
RDW: 13.3 % (ref 11.5–15.5)
WBC: 6.1 10*3/uL (ref 4.0–10.5)
nRBC: 0 % (ref 0.0–0.2)

## 2022-08-16 LAB — HEPATITIS B SURFACE ANTIGEN: Hepatitis B Surface Ag: NONREACTIVE

## 2022-08-16 LAB — HEPATITIS C ANTIBODY: HCV Ab: NONREACTIVE

## 2022-08-16 LAB — TECHNOLOGIST SMEAR REVIEW

## 2022-08-16 LAB — RETICULOCYTES
Immature Retic Fract: 5.9 % (ref 2.3–15.9)
RBC.: 4.06 MIL/uL (ref 3.87–5.11)
Retic Count, Absolute: 56.4 10*3/uL (ref 19.0–186.0)
Retic Ct Pct: 1.4 % (ref 0.4–3.1)

## 2022-08-16 LAB — HEPATITIS B CORE ANTIBODY, TOTAL: Hep B Core Total Ab: NONREACTIVE

## 2022-08-16 LAB — HEPATITIS B SURFACE ANTIBODY,QUALITATIVE: Hep B S Ab: NONREACTIVE

## 2022-08-16 NOTE — Patient Instructions (Signed)
[facility]  Discharge Instructions  You were seen and examined today by Dr. Ellin Saba. Dr. Ellin Saba is a hematologist, meaning that he specializes in blood abnormalities. Dr. Ellin Saba discussed your past medical history, family history of cancers/blood conditions and the events that led to you being here today.  You were referred to Dr. Ellin Saba due to an enlarged spleen. The spleen is an organ responsible for blood filtration.  Dr. Ellin Saba has recommended additional labs today for further evaluation. Dr. Ellin Saba has also recommended a repeat CT scan to see the current size of the spleen.  Follow-up as scheduled.  Thank you for choosing [facility] to provide your oncology and hematology care.   To afford each patient quality time with our provider, please arrive at least 15 minutes before your scheduled appointment time. You may need to reschedule your appointment if you arrive late (10 or more minutes). Arriving late affects you and other patients whose appointments are after yours.  Also, if you miss three or more appointments without notifying the office, you may be dismissed from the clinic at the provider's discretion.    Again, thank you for choosing [facility].  Our hope is that these requests will decrease the amount of time that you wait before being seen by our physicians.   If you have a lab appointment with the Cancer Center - please note that after April 8th, all labs will be drawn in the cancer center.  You do not have to check in or register with the main entrance as you have in the past but will complete your check-in at the cancer center.            _____________________________________________________________  Should you have questions after your visit to [facility], please contact our office at [phone] and follow the prompts.  Our office hours are 8:00 a.m. to 4:30 p.m.  Monday - Thursday and 8:00 a.m. to 2:30 p.m. Friday.  Please note that voicemails left after 4:00 p.m. may not be returned until the following business day.  We are closed weekends and all major holidays.  You do have access to a nurse 24-7, just call the main number to the clinic [phone] and do not press any options, hold on the line and a nurse will answer the phone.    For prescription refill requests, have your pharmacy contact our office and allow 72 hours.    Masks are no longer required in the cancer centers. If you would like for your care team to wear a mask while they are taking care of you, please let them know. You may have one support person who is at least 78 years old accompany you for your appointments.

## 2022-08-18 LAB — EBV AB TO VIRAL CAPSID AG PNL, IGG+IGM
EBV VCA IgG: >600 U/mL — ABNORMAL HIGH (ref 0.0–17.9)
EBV VCA IgM: <36 U/mL (ref 0.0–35.9)

## 2022-08-18 LAB — HAPTOGLOBIN: Haptoglobin: 128 mg/dL (ref 42–346)

## 2022-08-18 LAB — CMV ANTIBODY, IGG (EIA): CMV Ab - IgG: 7.9 U/mL — ABNORMAL HIGH (ref 0.00–0.59)

## 2022-08-19 ENCOUNTER — Ambulatory Visit (INDEPENDENT_AMBULATORY_CARE_PROVIDER_SITE_OTHER): Payer: Medicare Other

## 2022-08-19 VITALS — Ht 60.0 in | Wt 142.0 lb

## 2022-08-19 DIAGNOSIS — Z0000 Encounter for general adult medical examination without abnormal findings: Secondary | ICD-10-CM

## 2022-08-19 NOTE — Patient Instructions (Signed)
Amy [name] , Thank you for taking time to come for your Medicare Wellness Visit. I appreciate your ongoing commitment to your health goals. Please review the following plan we discussed and let me know if I can assist you in the future.   These are the goals we discussed:  Goals       Patient Stated (pt-stated)      Patient's goal is to find out what is wrong with her spleen.         This is a list of the screening recommended for you and due dates:  Health Maintenance  Topic Date Due   DTaP/Tdap/Td vaccine (1 - Tdap) Never done   COVID-19 Vaccine (1 - 2023-24 season) Never done   Flu Shot  09/05/2022   Medicare Annual Wellness Visit  08/19/2023   Pneumonia Vaccine  Completed   DEXA scan (bone density measurement)  Completed   Hepatitis C Screening  Completed   Zoster (Shingles) Vaccine  Completed   HPV Vaccine  Aged Out    Advanced directives: Advance directive discussed with you today. Even though you declined this today, please call our office should you change your mind, and we can give you the proper paperwork for you to fill out. Advance care planning is a way to make decisions about medical care that fits your values in case you are ever unable to make these decisions for yourself.  Information on Advanced Care Planning can be found at [facility] [place] Advance Health Care Directives Advance Health Care Directives ([email or web address]    Conditions/risks identified:  Aim for 30 minutes of exercise or brisk walking, 6-8 glasses of water, and 5 servings of fruits and vegetables each day.   Next appointment: VIRTUAL/TELEPHONE APPOINTMENT Follow up in one year for your annual wellness visit  September 24, 2023 at 3:00 pm telephone visit   Preventive Care 65 Years and Older, Female Preventive care refers to lifestyle choices and visits with your health care provider that can promote health and wellness. What does preventive care include? A yearly physical exam. This  is also called an annual well check. Dental exams once or twice a year. Routine eye exams. Ask your health care provider how often you should have your eyes checked. Personal lifestyle choices, including: Daily care of your teeth and gums. Regular physical activity. Eating a healthy diet. Avoiding tobacco and drug use. Limiting alcohol use. Practicing safe sex. Taking low-dose aspirin every day. Taking vitamin and mineral supplements as recommended by your health care provider. What happens during an annual well check? The services and screenings done by your health care provider during your annual well check will depend on your age, overall health, lifestyle risk factors, and family history of disease. Counseling  Your health care provider may ask you questions about your: Alcohol use. Tobacco use. Drug use. Emotional well-being. Home and relationship well-being. Sexual activity. Eating habits. History of falls. Memory and ability to understand (cognition). Work and work Astronomer. Reproductive health. Screening  You may have the following tests or measurements: Height, weight, and BMI. Blood pressure. Lipid and cholesterol levels. These may be checked every 5 years, or more frequently if you are over 81 years old. Skin check. Lung cancer screening. You may have this screening every year starting at age 44 if you have a 30-pack-year history of smoking and currently smoke or have quit within the past 15 years. Fecal occult blood test (FOBT) of the stool. You may have this test  every year starting at age 106. Flexible sigmoidoscopy or colonoscopy. You may have a sigmoidoscopy every 5 years or a colonoscopy every 10 years starting at age 91. Hepatitis C blood test. Hepatitis B blood test. Sexually transmitted disease (STD) testing. Diabetes screening. This is done by checking your blood sugar (glucose) after you have not eaten for a while (fasting). You may have this done every  1-3 years. Bone density scan. This is done to screen for osteoporosis. You may have this done starting at age 44. Mammogram. This may be done every 1-2 years. Talk to your health care provider about how often you should have regular mammograms. Talk with your health care provider about your test results, treatment options, and if necessary, the need for more tests. Vaccines  Your health care provider may recommend certain vaccines, such as: Influenza vaccine. This is recommended every year. Tetanus, diphtheria, and acellular pertussis (Tdap, Td) vaccine. You may need a Td booster every 10 years. Zoster vaccine. You may need this after age 35. Pneumococcal 13-valent conjugate (PCV13) vaccine. One dose is recommended after age 27. Pneumococcal polysaccharide (PPSV23) vaccine. One dose is recommended after age 52. Talk to your health care provider about which screenings and vaccines you need and how often you need them. This information is not intended to replace advice given to you by your health care provider. Make sure you discuss any questions you have with your health care provider. Document Released: 02/17/2015 Document Revised: 10/11/2015 Document Reviewed: 11/22/2014 Elsevier Interactive Patient Education  2017 [employer].  Fall Prevention in the Home Falls can cause injuries. They can happen to people of all ages. There are many things you can do to make your home safe and to help prevent falls. What can I do on the outside of my home? Regularly fix the edges of walkways and driveways and fix any cracks. Remove anything that might make you trip as you walk through a door, such as a raised step or threshold. Trim any bushes or trees on the path to your home. Use bright outdoor lighting. Clear any walking paths of anything that might make someone trip, such as rocks or tools. Regularly check to see if handrails are loose or broken. Make sure that both sides of any steps have  handrails. Any raised decks and porches should have guardrails on the edges. Have any leaves, snow, or ice cleared regularly. Use sand or salt on walking paths during winter. Clean up any spills in your garage right away. This includes oil or grease spills. What can I do in the bathroom? Use night lights. Install grab bars by the toilet and in the tub and shower. Do not use towel bars as grab bars. Use non-skid mats or decals in the tub or shower. If you need to sit down in the shower, use a plastic, non-slip stool. Keep the floor dry. Clean up any water that spills on the floor as soon as it happens. Remove soap buildup in the tub or shower regularly. Attach bath mats securely with double-sided non-slip rug tape. Do not have throw rugs and other things on the floor that can make you trip. What can I do in the bedroom? Use night lights. Make sure that you have a light by your bed that is easy to reach. Do not use any sheets or blankets that are too big for your bed. They should not hang down onto the floor. Have a firm chair that has side arms. You can use  this for support while you get dressed. Do not have throw rugs and other things on the floor that can make you trip. What can I do in the kitchen? Clean up any spills right away. Avoid walking on wet floors. Keep items that you use a lot in easy-to-reach places. If you need to reach something above you, use a strong step stool that has a grab bar. Keep electrical cords out of the way. Do not use floor polish or wax that makes floors slippery. If you must use wax, use non-skid floor wax. Do not have throw rugs and other things on the floor that can make you trip. What can I do with my stairs? Do not leave any items on the stairs. Make sure that there are handrails on both sides of the stairs and use them. Fix handrails that are broken or loose. Make sure that handrails are as long as the stairways. Check any carpeting to make sure  that it is firmly attached to the stairs. Fix any carpet that is loose or worn. Avoid having throw rugs at the top or bottom of the stairs. If you do have throw rugs, attach them to the floor with carpet tape. Make sure that you have a light switch at the top of the stairs and the bottom of the stairs. If you do not have them, ask someone to add them for you. What else can I do to help prevent falls? Wear shoes that: Do not have high heels. Have rubber bottoms. Are comfortable and fit you well. Are closed at the toe. Do not wear sandals. If you use a stepladder: Make sure that it is fully opened. Do not climb a closed stepladder. Make sure that both sides of the stepladder are locked into place. Ask someone to hold it for you, if possible. Clearly mark and make sure that you can see: Any grab bars or handrails. First and last steps. Where the edge of each step is. Use tools that help you move around (mobility aids) if they are needed. These include: Canes. Walkers. Scooters. Crutches. Turn on the lights when you go into a dark area. Replace any light bulbs as soon as they burn out. Set up your furniture so you have a clear path. Avoid moving your furniture around. If any of your floors are uneven, fix them. If there are any pets around you, be aware of where they are. Review your medicines with your doctor. Some medicines can make you feel dizzy. This can increase your chance of falling. Ask your doctor what other things that you can do to help prevent falls. This information is not intended to replace advice given to you by your health care provider. Make sure you discuss any questions you have with your health care provider. Document Released: 11/17/2008 Document Revised: 06/29/2015 Document Reviewed: 02/25/2014 Elsevier Interactive Patient Education  2017 [employer].

## 2022-08-19 NOTE — Progress Notes (Cosign Needed Addendum)
Because this visit was a virtual/telehealth visit,  certain criteria was not obtained, such a blood pressure, CBG if patient is a diabetic, and timed up and go. Any medications not marked as "taking" was not mentioned during the medication reconciliation part of the visit. Any vitals not documented were not able to be obtained due to this being a telehealth visit. Vitals documented are verbally provided by the patient.   Subjective:   Amy [name] is a 77 y.o. female who presents for Medicare Annual (Subsequent) preventive examination.  Visit Complete: Virtual  I connected with  [name] on 08/19/22 by a audio enabled telemedicine application and verified that I am speaking with the correct person using two identifiers.  Patient Location: Home  Provider Location: Home Office  I discussed the limitations of evaluation and management by telemedicine. The patient expressed understanding and agreed to proceed.  Patient Medicare AWV questionnaire was completed by the patient on n/a; I have confirmed that all information answered by patient is correct and no changes since this date.  Review of Systems     Cardiac Risk Factors include: advanced age (>69men, >77 women);dyslipidemia;hypertension;sedentary lifestyle     Objective:    Today's Vitals   08/19/22 1352 08/19/22 1356  Weight: 142 lb (64.4 kg)   Height: 5' (1.524 m)   PainSc:  0-No pain   Body mass index is 27.73 kg/m.     08/19/2022    1:52 PM 08/16/2022   10:00 AM 06/15/2020    9:23 PM 04/16/2018    7:27 AM 07/30/2016    5:55 AM 10/27/2014   10:25 AM 12/21/2013    2:04 PM  Advanced Directives  Does Patient Have a Medical Advance Directive? No No No No No No No  Would patient like information on creating a medical advance directive? No - Patient declined No - Patient declined  No - Patient declined No - Patient declined Yes - Educational materials given No - patient declined information    Current Medications  (verified) Outpatient Encounter Medications as of 08/19/2022  Medication Sig   acetaminophen (TYLENOL) 650 MG CR tablet Take 650 mg by mouth daily as needed for pain.   aspirin 81 MG tablet Take 81 mg by mouth daily.   atorvastatin (LIPITOR) 20 MG tablet Take 20 mg by mouth at bedtime.    calcium carbonate (OSCAL) 1500 (600 Ca) MG TABS tablet Take by mouth 2 (two) times daily with a meal.   Cholecalciferol (VITAMIN D3) 2000 units capsule Take 2,000 Units by mouth daily at 12 noon.    clobetasol (TEMOVATE) 0.05 % external solution Apply 1 application topically daily as needed (psoriasis).    clopidogrel (PLAVIX) 75 MG tablet TAKE 1 TABLET BY MOUTH DAILY   hydrochlorothiazide (HYDRODIURIL) 12.5 MG tablet Take 12.5 mg by mouth daily.   ipratropium (ATROVENT) 0.03 % nasal spray Place 2 sprays into both nostrils 3 (three) times daily.   isosorbide mononitrate (IMDUR) 30 MG 24 hr tablet Take 1 tablet (30 mg total) by mouth daily.   levothyroxine (SYNTHROID) 75 MCG tablet Take 75 mcg by mouth every morning.   lisinopril (ZESTRIL) 10 MG tablet Take 1 tablet (10 mg total) by mouth in the morning and at bedtime.   Multiple Vitamin (MULTIVITAMIN WITH MINERALS) TABS tablet Take 1 tablet by mouth daily at 12 noon.    omeprazole (PRILOSEC) 40 MG capsule Take 40 mg by mouth daily.   triamcinolone cream (KENALOG) 0.1 % Apply 1 application topically  daily as needed (psoriasis). Mixed with nystatin cream 1:1   No facility-administered encounter medications on file as of 08/19/2022.    Allergies (verified) Sulfa antibiotics   History: Past Medical History:  Diagnosis Date   Asthma    CAD (coronary artery disease)    60-70% proximal LAD/diagonal bifurcational disease October 2015 at [facility] status DES x 2 to LAD/diagonal October 2015   Essential hypertension    Hyperlipidemia    Hypothyroidism    Sinus node dysfunction (HCC)    Biotronik pacemaker in place   Past Surgical History:  Procedure  Laterality Date   ABDOMINAL HYSTERECTOMY     ANAL FISSURE REPAIR     CATARACT EXTRACTION W/PHACO Right 10/17/2014   Procedure: CATARACT EXTRACTION PHACO AND INTRAOCULAR LENS PLACEMENT (IOC);  Surgeon: Gemma Payor, MD;  Location: [facility];  Service: Ophthalmology;  Laterality: Right;  CDE: 4.88   CATARACT EXTRACTION W/PHACO Left 10/27/2014   Procedure: CATARACT EXTRACTION PHACO AND INTRAOCULAR LENS PLACEMENT (IOC);  Surgeon: Gemma Payor, MD;  Location: [facility];  Service: Ophthalmology;  Laterality: Left;  CDE:7.86   COLONOSCOPY N/A 04/16/2018   Procedure: COLONOSCOPY;  Surgeon: Malissa Hippo, MD;  Location: [facility] SUITE;  Service: Endoscopy;  Laterality: N/A;  830   FINGER SURGERY     NASAL SINUS SURGERY     PACEMAKER IMPLANT N/A 07/30/2016   Procedure: Pacemaker Implant Dual Chamber;  Surgeon: Marinus Maw, MD;  Location: [facility] INVASIVE CV LAB;  Service: Cardiovascular;  Laterality: N/A;   TONSILLECTOMY     Family History  Problem Relation Age of Onset   Heart disease Father    Social History   Socioeconomic History   Marital status: Widowed    Spouse name: Not on file   Number of children: Not on file   Years of education: Not on file   Highest education level: Not on file  Occupational History   Not on file  Tobacco Use   Smoking status: Never   Smokeless tobacco: Never  Vaping Use   Vaping status: Never Used  Substance and Sexual Activity   Alcohol use: No    Alcohol/week: 0.0 standard drinks of alcohol   Drug use: No   Sexual activity: Never  Other Topics Concern   Not on file  Social History Narrative   Not on file   Social Determinants of Health   Financial Resource Strain: Low Risk  (08/19/2022)   Overall Financial Resource Strain (CARDIA)    Difficulty of Paying Living Expenses: Not hard at all  Food Insecurity: No Food Insecurity (08/19/2022)   Hunger Vital Sign    Worried About Running Out of Food in the Last Year: Never true    Ran Out of Food in the Last Year:  Never true  Transportation Needs: No Transportation Needs (08/19/2022)   PRAPARE - Administrator, Civil Service (Medical): No    Lack of Transportation (Non-Medical): No  Physical Activity: Sufficiently Active (08/19/2022)   Exercise Vital Sign    Days of Exercise per Week: 7 days    Minutes of Exercise per Session: 60 min  Stress: No Stress Concern Present (08/19/2022)   [name] of Occupational Health - Occupational Stress Questionnaire    Feeling of Stress : Not at all  Social Connections: Socially Integrated (08/19/2022)   Social Connection and Isolation Panel [NHANES]    Frequency of Communication with Friends and Family: More than three times a week    Frequency of Social Gatherings  with Friends and Family: More than three times a week    Attends Religious Services: More than 4 times per year    Active Member of Clubs or Organizations: Yes    Attends Engineer, structural: More than 4 times per year    Marital Status: Married    Tobacco Counseling Counseling given: Yes   Clinical Intake:  Pre-visit preparation completed: Yes  Pain : No/denies pain Pain Score: 0-No pain     BMI - recorded: 27.73 Nutritional Status: BMI 25 -29 Overweight Nutritional Risks: None Diabetes: No  How often do you need to have someone help you when you read instructions, pamphlets, or other written materials from your doctor or pharmacy?: 1 - Never  Interpreter Needed?: No  Information entered by :: Abby Omere Marti, CMA   Activities of Daily Living    08/19/2022    1:58 PM  In your present state of health, do you have any difficulty performing the following activities:  Hearing? 0  Vision? 0  Difficulty concentrating or making decisions? 0  Walking or climbing stairs? 0  Dressing or bathing? 0  Doing errands, shopping? 0  Preparing Food and eating ? N  Using the Toilet? N  In the past six months, have you accidently leaked urine? N  Do you have problems  with loss of bowel control? N  Managing your Medications? N  Managing your Finances? N  Housekeeping or managing your Housekeeping? N    Patient Care Team: Billie Lade, MD as PCP - General (Internal Medicine) Jonelle Sidle, MD as PCP - Cardiology (Cardiology) Marinus Maw, MD as PCP - Electrophysiology (Cardiology) Center, [facility] as Attending Physician Doreatha Massed, MD as Medical Oncologist (Hematology)  Indicate any recent Medical Services you may have received from other than [facility] providers in the past year (date may be approximate).     Assessment:   This is a routine wellness examination for [facility].  Hearing/Vision screen Hearing Screening - Comments:: Patient denies any hearing difficulties.    Dietary issues and exercise activities discussed:     Goals Addressed               This Visit's Progress     Patient Stated (pt-stated)        Patient's goal is to find out what is wrong with her spleen.        Depression Screen    08/19/2022    1:58 PM 08/01/2022    1:56 PM 07/11/2022    3:28 PM 04/01/2014   10:33 AM 12/21/2013    2:04 PM  PHQ 2/9 Scores  PHQ - 2 Score 0 0 0 2 2  PHQ- 9 Score 0 5 2 8 5     Fall Risk    08/19/2022    1:58 PM 08/01/2022    1:56 PM 07/11/2022    3:28 PM 12/21/2013    2:04 PM  Fall Risk   Falls in the past year? 0 0 0 Yes  Number falls in past yr: 0 0 0 1  Injury with Fall? 0 0 0 No  Comment    Patient was bruised in groin area  Risk for fall due to : No Fall Risks No Fall Risks No Fall Risks   Follow up Falls prevention discussed Falls evaluation completed Falls evaluation completed     MEDICARE RISK AT HOME:  Medicare Risk at Home - 08/19/22 1357     Any stairs in or around the home?  No    If so, are there any without handrails? No    Home free of loose throw rugs in walkways, pet beds, electrical cords, etc? Yes    Adequate lighting in your home to reduce risk of falls? Yes    Life alert? No     Use of a cane, walker or w/c? No    Grab bars in the bathroom? Yes    Shower chair or bench in shower? No    Elevated toilet seat or a handicapped toilet? Yes             TIMED UP AND GO:  Was the test performed?  No    Cognitive Function:        08/19/2022    1:57 PM  6CIT Screen  What Year? 0 points  What month? 0 points  What time? 0 points  Count back from 20 0 points  Months in reverse 0 points  Repeat phrase 0 points  Total Score 0 points    Immunizations Immunization History  Administered Date(s) Administered   PNEUMOCOCCAL CONJUGATE-20 05/29/2022   Pneumococcal Conjugate,unspecified 12/14/2013   Pneumococcal Conjugate-13 11/02/2014   Pneumococcal Polysaccharide-23 10/01/2016   Zoster Recombinant(Shingrix) 11/12/2017, 02/11/2018    TDAP status: Due, Education has been provided regarding the importance of this vaccine. Advised may receive this vaccine at local pharmacy or Health Dept. Aware to provide a copy of the vaccination record if obtained from local pharmacy or Health Dept. Verbalized acceptance and understanding.  Flu Vaccine status: Up to date  Pneumococcal vaccine status: Up to date  Covid-19 vaccine status: Information provided on how to obtain vaccines.   Qualifies for Shingles Vaccine? Yes   Zostavax completed Yes   Shingrix Completed?: Yes  Screening Tests Health Maintenance  Topic Date Due   DTaP/Tdap/Td (1 - Tdap) Never done   COVID-19 Vaccine (1 - 2023-24 season) Never done   INFLUENZA VACCINE  09/05/2022   Medicare Annual Wellness (AWV)  05/29/2023   Pneumonia Vaccine 82+ Years old  Completed   DEXA SCAN  Completed   Hepatitis C Screening  Completed   Zoster Vaccines- Shingrix  Completed   HPV VACCINES  Aged Out    Health Maintenance  Health Maintenance Due  Topic Date Due   DTaP/Tdap/Td (1 - Tdap) Never done   COVID-19 Vaccine (1 - 2023-24 season) Never done    Colorectal Screening: Patient declined  Mammogram  status: Completed 04/15/22. Repeat every year  Bone Density Status: Scheduled to have completed in August at the [facility] in [place]  Lung Cancer Screening: (Low Dose CT Chest recommended if Age 37-80 years, 20 pack-year currently smoking OR have quit w/in 15years.) does not qualify.   Additional Screening:  Hepatitis C Screening: does not qualify; Completed 08/16/22  Vision Screening: Recommended annual ophthalmology exams for early detection of glaucoma and other disorders of the eye. Is the patient up to date with their annual eye exam?  Yes  Who is the provider or what is the name of the office in which the patient attends annual eye exams? Dr. Charise Killian w/ [facility] If pt is not established with a provider, would they like to be referred to a provider to establish care? No .   Dental Screening: Recommended annual dental exams for proper oral hygiene  Diabetic Foot Exam: n/a  Community Resource Referral / Chronic Care Management: CRR required this visit?  No   CCM required this visit?  No  Plan:     I have personally reviewed and noted the following in the patient's chart:   Medical and social history Use of alcohol, tobacco or illicit drugs  Current medications and supplements including opioid prescriptions. Patient is not currently taking opioid prescriptions. Functional ability and status Nutritional status Physical activity Advanced directives List of other physicians Hospitalizations, surgeries, and ER visits in previous 12 months Vitals Screenings to include cognitive, depression, and falls Referrals and appointments  In addition, I have reviewed and discussed with patient certain preventive protocols, quality metrics, and best practice recommendations. A written personalized care plan for preventive services as well as general preventive health recommendations were provided to patient.     Jordan Hawks Janne Faulk, CMA   08/19/2022   After Visit Summary: (Mail)  Due to this being a telephonic visit, the after visit summary with patients personalized plan was offered to patient via mail   Nurse Notes: Patient will have Dexa Scan completed in August.

## 2022-08-25 LAB — JAK2 V617F RFX CALR/MPL/E12-15

## 2022-08-25 LAB — CALR +MPL + E12-E15  (REFLEX)

## 2022-09-05 DIAGNOSIS — M85851 Other specified disorders of bone density and structure, right thigh: Secondary | ICD-10-CM | POA: Diagnosis not present

## 2022-09-05 DIAGNOSIS — M810 Age-related osteoporosis without current pathological fracture: Secondary | ICD-10-CM | POA: Diagnosis not present

## 2022-09-05 DIAGNOSIS — Z780 Asymptomatic menopausal state: Secondary | ICD-10-CM | POA: Diagnosis not present

## 2022-09-05 LAB — HM DEXA SCAN

## 2022-09-09 ENCOUNTER — Encounter: Payer: Self-pay | Admitting: Cardiology

## 2022-09-09 ENCOUNTER — Ambulatory Visit: Payer: Medicare Other | Admitting: Cardiology

## 2022-09-09 ENCOUNTER — Ambulatory Visit: Payer: Medicare Other | Attending: Cardiology | Admitting: Cardiology

## 2022-09-09 VITALS — BP 138/69 | HR 69 | Wt 147.0 lb

## 2022-09-09 DIAGNOSIS — I25119 Atherosclerotic heart disease of native coronary artery with unspecified angina pectoris: Secondary | ICD-10-CM | POA: Insufficient documentation

## 2022-09-09 DIAGNOSIS — I10 Essential (primary) hypertension: Secondary | ICD-10-CM | POA: Insufficient documentation

## 2022-09-09 DIAGNOSIS — E782 Mixed hyperlipidemia: Secondary | ICD-10-CM | POA: Diagnosis not present

## 2022-09-09 NOTE — Progress Notes (Signed)
Cardiology Office Note  Date: 09/09/2022   ID: YAMILLET GOERINGER, DOB 07-21-1945, MRN [id number]  History of Present Illness: Amy [name] is a 77 y.o. female last seen in February.  She is here today with family member for a follow-up visit.  Reports no angina on current medical regimen.  Antihypertensive regimen has been adjusted by PCP, she is now following with Dr. Durwin Nora in [place].  States that blood pressure fluctuates, I did review some of her most recent numbers however.  It sounds like she has an old blood pressure cuff at home.  I went over her medication list.  She does not report any spontaneous bleeding problems on aspirin and Plavix.  Also continues on Lipitor with last LDL 68 in April.  Cardiac structural and ischemic testing from October of last year outlined below.  Physical Exam: VS:  BP 138/69   Pulse 69   Wt 147 lb (66.7 kg)   SpO2 96%   BMI 28.71 kg/m , BMI Body mass index is 28.71 kg/m.  Wt Readings from Last 3 Encounters:  09/09/22 147 lb (66.7 kg)  08/19/22 142 lb (64.4 kg)  08/16/22 144 lb 12.8 oz (65.7 kg)    General: Patient appears comfortable at rest. HEENT: Conjunctiva and lids normal. Neck: Supple, no elevated JVP or carotid bruits. Lungs: Clear to auscultation, nonlabored breathing at rest. Cardiac: Regular rate and rhythm, no S3, 2/6 systolic murmur. Extremities: No pitting edema.  ECG:  An ECG dated 11/12/2021 was personally reviewed today and demonstrated:  Atrial paced rhythm with increased voltage.  Labwork: April 2024: Potassium 4.3, BUN 13, creatinine 1.1, AST 16, ALT 13, hemoglobin 12.0, platelets 164, cholesterol 148, triglycerides 65, HDL 67, LDL 68, TSH 2.59 08/16/2022: Hemoglobin 11.7; Platelets 161   Other Studies Reviewed Today:  Echocardiogram 11/20/2021:  1. Left ventricular ejection fraction, by estimation, is 65 to 70%. The  left ventricle has normal function. The left ventricle has no regional  wall motion abnormalities.  Left ventricular diastolic parameters are  consistent with Grade I diastolic  dysfunction (impaired relaxation). Elevated left ventricular end-diastolic  pressure.   2. Right ventricular systolic function is normal. The right ventricular  size is normal. There is mildly elevated pulmonary artery systolic  pressure. The estimated right ventricular systolic pressure is 40.2 mmHg.   3. Left atrial size was severely dilated.   4. The mitral valve is abnormal. Mild mitral valve regurgitation.   5. Tricuspid valve regurgitation is moderate.   6. The aortic valve is tricuspid. Aortic valve regurgitation is trivial.  Mild to moderate aortic valve stenosis. Aortic valve mean gradient  measures 8.0 mmHg. Dimentionless index 0.63.   7. The inferior vena cava is normal in size with greater than 50%  respiratory variability, suggesting right atrial pressure of 3 mmHg.    Lexiscan Myoview 11/20/2021:     Findings are equivocal. The study is low risk.   No ST deviation was noted. The ECG was negative for ischemia.   LV perfusion is abnormal.  Very small, mild intensity, apical anteroseptal defect that is reversible in the setting of breast attenuation artifact.  Cannot exclude a minor ischemic territory.   Left ventricular function is normal. Nuclear stress EF: 69 %. End diastolic cavity size is normal.   Low risk study.  There is a very small region of ischemia at the anteroseptal apex noted in the setting of breast attenuation artifact.  LVEF normal at 69%.  Assessment and Plan:  1.  CAD status post DES x 2 to the LAD/diagonal bifurcation in 2015.  Follow-up Lexiscan Myoview in October 2023 was low risk with no large ischemic territories.  No progressive angina reported on medical therapy.  Continue aspirin, Lipitor, and Imdur.   2.  Mild to moderate aortic stenosis by follow-up echocardiogram in October 2023.  AV mean gradient 8 mmHg with dimensionless index 0.63.  Continue observation.   3.   HFpEF, vigorous LVEF at 65 to 70% with mild diastolic dysfunction, increased LV filling pressures, and mildly increased RVSP.  Did not tolerate [place].  Continue lisinopril and HCTZ.  4.  Essential hypertension.  I reviewed her medications.  Asked her to check and see if she may need to update her home blood pressure cuff.  Disposition:  Follow up  6 months.  Signed, Jonelle Sidle, M.D., F.A.C.C. [facility] at [facility]

## 2022-09-09 NOTE — Patient Instructions (Addendum)

## 2022-09-12 ENCOUNTER — Ambulatory Visit (HOSPITAL_COMMUNITY)
Admission: RE | Admit: 2022-09-12 | Discharge: 2022-09-12 | Disposition: A | Discharge status: Home / Self Care | Payer: Medicare Other | Source: Ambulatory Visit | Attending: Hematology | Admitting: Hematology

## 2022-09-12 DIAGNOSIS — K769 Liver disease, unspecified: Secondary | ICD-10-CM | POA: Diagnosis not present

## 2022-09-12 DIAGNOSIS — R161 Splenomegaly, not elsewhere classified: Secondary | ICD-10-CM

## 2022-09-12 DIAGNOSIS — K5730 Diverticulosis of large intestine without perforation or abscess without bleeding: Secondary | ICD-10-CM | POA: Diagnosis not present

## 2022-09-12 MED ORDER — IOHEXOL 300 MG/ML  SOLN
75.0000 mL | Freq: Once | INTRAMUSCULAR | Status: AC | PRN
  Administered 2022-09-12: 100 mL via INTRAVENOUS

## 2022-09-16 ENCOUNTER — Ambulatory Visit: Payer: Medicare Other | Admitting: Hematology

## 2022-09-18 NOTE — Progress Notes (Signed)
[facility] [address], [place] [address]   Clinic Day:  09/19/2022   Referring physician: Billie Lade, MD  Patient Care Team: Billie Lade, MD as PCP - General (Internal Medicine) Jonelle Sidle, MD as PCP - Cardiology (Cardiology) Marinus Maw, MD as PCP - Electrophysiology (Cardiology) Center, [facility] as Attending Physician Doreatha Massed, MD as Medical Oncologist (Hematology)   ASSESSMENT & PLAN:   Assessment:  1.  Splenomegaly: - Patient seen at the request of Dr. Durwin Nora for splenomegaly. - Ultrasound (11/2021): Suggestive of splenomegaly. - CT abdomen (12/10/2021): Splenomegaly 18 cm in craniocaudal dimension.  Subcentimeter hypodensity in segment 4 poorly visualized. - Ultrasound RUQ (07/25/2022): No liver mass visualized. - Denies any B symptoms or recurrent infections.  2.  Social/family history: - She lives by herself and is independent of ADLs and IADLs.  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  She now works at [employer] for [employer].  Non-smoker.  No chemical exposure. - No family history of hepatosplenomegaly.  Maternal great grandmother had ovarian cancer.  Plan:  1.  Splenomegaly: - We discussed different etiologies of splenomegaly. - Will check for infectious diseases, and myeloproliferative neoplasms. - Will plan on repeating CTAP with contrast. - If the splenomegaly persists without improvement in size, will consider PET CT scan to evaluate for indolent lymphoma.  No orders of the defined types were placed in this encounter.     Alben Deeds Teague,acting as a Neurosurgeon for Doreatha Massed, MD.,have documented all relevant documentation on the behalf of Doreatha Massed, MD,as directed by  Doreatha Massed, MD while in the presence of Doreatha Massed, MD.  ***   [place] R Teague   8/14/20249:55 PM  CHIEF COMPLAINT/PURPOSE OF CONSULT:   Diagnosis: Splenomegaly   Cancer Staging   No matching staging information was found for the patient.    Prior Therapy: None  Current Therapy: Under workup   HISTORY OF PRESENT ILLNESS:   Amy [name] is a 77 y.o. female presenting to clinic today for evaluation of an enlarged spleen at the request of Dr. Levon Hedger.  Patient had an [place] in October 2023 that showed an 18 cm spleen.   Patient underwent an US of the abdomen limited to the RUQ on 6/28 that showed the known mass in the hepatic dome is not visualized on today's study. The mass is low suspicion on CT imaging.  Her recent CBC on 05/21/22 showed abnormal ALC at 13, L at 26%, and G at 66%.   Today, she states that she is doing well overall. Her appetite level is at 100%. Her energy level is at 80%. She is accompanied by her daughter.   She reports she has had multiple scans that showed an enlarged spleen. Patient reports she has a pacemaker, so she could not have an MRI.   She notes she had anemia and a hysterectomy. Her mother had anemia. She notes her maternal great-great grandmother had ovarian cancer. She denies family history of hereditary splenocytosis. She reports maternal and paternal family members have died from heart diseases or strokes.   She reports of feeling full after eating and bloating. She denies fever, night sweats, weight loss, recurrent infections. She denies ever having infectious mononucleosis.   She had a stomach virus which caused a high fever over 2 years ago. She notes she fell on a metal trash can 4 years ago to her right side. She had to receive physical therapy and could not  walk at first. She reportedly broke her ribs.   She currently works at [employer] for [employer]. She used to do office work. She is able to do activities around the house without assistance. She denies any chemical exposure, smoking, or consuming alcohol.   INTERVAL HISTORY:   Amy [name] is a 77 y.o. female presenting to the clinic today for follow-up of Splenomegaly. She was last  seen by me on 08/16/22 in consultation.  Since her last visit, she underwent CT A/P on 09/12/22 that found: stable marked splenomegaly; no evidence of mass or lymphadenopathy; no acute findings; and colonic diverticulosis, without radiographic evidence of diverticulitis.  Today, she states that she is doing well overall. Her appetite level is at ***%. Her energy level is at ***%.   PAST MEDICAL HISTORY:   Past Medical History: Past Medical History:  Diagnosis Date   Asthma    CAD (coronary artery disease)    60-70% proximal LAD/diagonal bifurcational disease October 2015 at [facility] status DES x 2 to LAD/diagonal October 2015   Essential hypertension    Hyperlipidemia    Hypothyroidism    Sinus node dysfunction (HCC)    Biotronik pacemaker in place    Surgical History: Past Surgical History:  Procedure Laterality Date   ABDOMINAL HYSTERECTOMY     ANAL FISSURE REPAIR     CATARACT EXTRACTION W/PHACO Right 10/17/2014   Procedure: CATARACT EXTRACTION PHACO AND INTRAOCULAR LENS PLACEMENT (IOC);  Surgeon: Gemma Payor, MD;  Location: [facility];  Service: Ophthalmology;  Laterality: Right;  CDE: 4.88   CATARACT EXTRACTION W/PHACO Left 10/27/2014   Procedure: CATARACT EXTRACTION PHACO AND INTRAOCULAR LENS PLACEMENT (IOC);  Surgeon: Gemma Payor, MD;  Location: [facility] ORS;  Service: Ophthalmology;  Laterality: Left;  CDE:7.86   COLONOSCOPY N/A 04/16/2018   Procedure: COLONOSCOPY;  Surgeon: Malissa Hippo, MD;  Location: [facility] ENDO SUITE;  Service: Endoscopy;  Laterality: N/A;  830   FINGER SURGERY     NASAL SINUS SURGERY     PACEMAKER IMPLANT N/A 07/30/2016   Procedure: Pacemaker Implant Dual Chamber;  Surgeon: Marinus Maw, MD;  Location: [facility] INVASIVE CV LAB;  Service: Cardiovascular;  Laterality: N/A;   TONSILLECTOMY      Social History: Social History   Socioeconomic History   Marital status: Widowed    Spouse name: Not on file   Number of children: Not on file   Years of education: Not on file    Highest education level: Not on file  Occupational History   Not on file  Tobacco Use   Smoking status: Never   Smokeless tobacco: Never  Vaping Use   Vaping status: Never Used  Substance and Sexual Activity   Alcohol use: No    Alcohol/week: 0.0 standard drinks of alcohol   Drug use: No   Sexual activity: Never  Other Topics Concern   Not on file  Social History Narrative   Not on file   Social Determinants of Health   Financial Resource Strain: Low Risk  (08/19/2022)   Overall Financial Resource Strain (CARDIA)    Difficulty of Paying Living Expenses: Not hard at all  Food Insecurity: No Food Insecurity (08/19/2022)   Hunger Vital Sign    Worried About Running Out of Food in the Last Year: Never true    Ran Out of Food in the Last Year: Never true  Transportation Needs: No Transportation Needs (08/19/2022)   PRAPARE - Administrator, Civil Service (Medical): No  Lack of Transportation (Non-Medical): No  Physical Activity: Sufficiently Active (08/19/2022)   Exercise Vital Sign    Days of Exercise per Week: 7 days    Minutes of Exercise per Session: 60 min  Stress: No Stress Concern Present (08/19/2022)   [name] of Occupational Health - Occupational Stress Questionnaire    Feeling of Stress : Not at all  Social Connections: Socially Integrated (08/19/2022)   Social Connection and Isolation Panel [NHANES]    Frequency of Communication with Friends and Family: More than three times a week    Frequency of Social Gatherings with Friends and Family: More than three times a week    Attends Religious Services: More than 4 times per year    Active Member of [employer] or Organizations: Yes    Attends Engineer, structural: More than 4 times per year    Marital Status: Married  Catering manager Violence: Not At Risk (08/19/2022)   Humiliation, Afraid, Rape, and Kick questionnaire    Fear of Current or Ex-Partner: No    Emotionally Abused: No     Physically Abused: No    Sexually Abused: No    Family History: Family History  Problem Relation Age of Onset   Heart disease Father     Current Medications:  Current Outpatient Medications:    acetaminophen (TYLENOL) 650 MG CR tablet, Take 650 mg by mouth daily as needed for pain., Disp: , Rfl:    aspirin 81 MG tablet, Take 81 mg by mouth daily., Disp: , Rfl:    atorvastatin (LIPITOR) 20 MG tablet, Take 20 mg by mouth at bedtime. , Disp: , Rfl: 3   calcium carbonate (OSCAL) 1500 (600 Ca) MG TABS tablet, Take by mouth 2 (two) times daily with a meal., Disp: , Rfl:    Cholecalciferol (VITAMIN D3) 2000 units capsule, Take 2,000 Units by mouth daily at 12 noon. , Disp: , Rfl:    clobetasol (TEMOVATE) 0.05 % external solution, Apply 1 application topically daily as needed (psoriasis). , Disp: , Rfl:    clopidogrel (PLAVIX) 75 MG tablet, TAKE 1 TABLET BY MOUTH DAILY, Disp: 90 tablet, Rfl: 3   hydrochlorothiazide (HYDRODIURIL) 12.5 MG tablet, Take 12.5 mg by mouth daily., Disp: , Rfl:    ipratropium (ATROVENT) 0.03 % nasal spray, Place 2 sprays into both nostrils 3 (three) times daily., Disp: 30 mL, Rfl: 5   isosorbide mononitrate (IMDUR) 30 MG 24 hr tablet, Take 1 tablet (30 mg total) by mouth daily., Disp: 30 tablet, Rfl: 2   levothyroxine (SYNTHROID) 75 MCG tablet, Take 75 mcg by mouth every morning., Disp: , Rfl:    lisinopril (ZESTRIL) 10 MG tablet, Take 1 tablet (10 mg total) by mouth in the morning and at bedtime., Disp: 60 tablet, Rfl: 2   Multiple Vitamin (MULTIVITAMIN WITH MINERALS) TABS tablet, Take 1 tablet by mouth daily at 12 noon. , Disp: , Rfl:    omeprazole (PRILOSEC) 40 MG capsule, Take 40 mg by mouth daily., Disp: , Rfl:    triamcinolone cream (KENALOG) 0.1 %, Apply 1 application topically daily as needed (psoriasis). Mixed with nystatin cream 1:1, Disp: , Rfl:    Allergies: Allergies  Allergen Reactions   Sulfa Antibiotics Rash    REVIEW OF SYSTEMS:   Review of  Systems  Constitutional:  Negative for chills, fatigue and fever.  HENT:   Negative for lump/mass, mouth sores, nosebleeds, sore throat and trouble swallowing.   Eyes:  Negative for eye problems.  Respiratory:  Negative for cough and shortness of breath.   Cardiovascular:  Negative for chest pain, leg swelling and palpitations.  Gastrointestinal:  Negative for abdominal pain, constipation, diarrhea, nausea and vomiting.  Genitourinary:  Negative for bladder incontinence, difficulty urinating, dysuria, frequency, hematuria and nocturia.   Musculoskeletal:  Negative for arthralgias, back pain, flank pain, myalgias and neck pain.  Skin:  Negative for itching and rash.  Neurological:  Negative for dizziness, headaches and numbness.  Hematological:  Does not bruise/bleed easily.  Psychiatric/Behavioral:  Negative for depression, sleep disturbance and suicidal ideas. The patient is not nervous/anxious.   All other systems reviewed and are negative.    VITALS:   There were no vitals taken for this visit.  Wt Readings from Last 3 Encounters:  09/09/22 147 lb (66.7 kg)  08/19/22 142 lb (64.4 kg)  08/16/22 144 lb 12.8 oz (65.7 kg)    There is no height or weight on file to calculate BMI.  Performance status (ECOG): 1 - Symptomatic but completely ambulatory  PHYSICAL EXAM:   Physical Exam Vitals and nursing note reviewed. Exam conducted with a chaperone present.  Constitutional:      Appearance: Normal appearance.  Cardiovascular:     Rate and Rhythm: Normal rate and regular rhythm.     Pulses: Normal pulses.     Heart sounds: Normal heart sounds.  Pulmonary:     Effort: Pulmonary effort is normal.     Breath sounds: Normal breath sounds.  Abdominal:     Palpations: Abdomen is soft. There is no hepatomegaly, splenomegaly or mass.     Tenderness: There is no abdominal tenderness.  Musculoskeletal:     Right lower leg: No edema.     Left lower leg: No edema.  Lymphadenopathy:      Cervical: No cervical adenopathy.     Right cervical: No superficial, deep or posterior cervical adenopathy.    Left cervical: No superficial, deep or posterior cervical adenopathy.     Upper Body:     Right upper body: No supraclavicular or axillary adenopathy.     Left upper body: No supraclavicular or axillary adenopathy.  Neurological:     General: No focal deficit present.     Mental Status: She is alert and oriented to person, place, and time.  Psychiatric:        Mood and Affect: Mood normal.        Behavior: Behavior normal.     LABS:      Latest Ref Rng & Units 08/16/2022   10:36 AM 06/15/2020    9:48 PM 07/26/2016    9:08 AM  CBC  WBC 4.0 - 10.5 K/uL 6.1  15.4  5.4   Hemoglobin 12.0 - 15.0 g/dL 01.0  27.2  53.6   Hematocrit 36.0 - 46.0 % 34.6  41.9  36.4   Platelets 150 - 400 K/uL 161  173  192       Latest Ref Rng & Units 07/16/2022    2:12 PM 06/15/2020    9:48 PM 07/26/2016    9:08 AM  CMP  Glucose 70 - 99 mg/dL  644  90   BUN 8 - 23 mg/dL  29  19   Creatinine 0.34 - 1.00 mg/dL  7.42  5.95   Sodium 638 - 145 mmol/L  127  137   Potassium 3.5 - 5.1 mmol/L  3.8  4.7   Chloride 98 - 111 mmol/L  92  99   CO2 22 - 32 mmol/L  25  24   Calcium 8.9 - 10.3 mg/dL  9.0  9.4   Total Protein 6.5 - 8.1 g/dL  7.8    Total Bilirubin 0.0 - 1.2 mg/dL 1.1  1.8    Alkaline Phos 38 - 126 U/L  109    AST 15 - 41 U/L  29    ALT 0 - 44 U/L  29       No results found for: "CEA1", "CEA" / No results found for: "CEA1", "CEA" No results found for: "PSA1" No results found for: "UJW119" No results found for: "CAN125"  No results found for: "TOTALPROTELP", "ALBUMINELP", "A1GS", "A2GS", "BETS", "BETA2SER", "GAMS", "MSPIKE", "SPEI" No results found for: "TIBC", "FERRITIN", "IRONPCTSAT" Lab Results  Component Value Date   LDH 136 08/16/2022     STUDIES:   CT ABDOMEN PELVIS W CONTRAST  Result Date: 09/14/2022 CLINICAL DATA:  Splenomegaly. EXAM: CT ABDOMEN AND PELVIS WITH CONTRAST  TECHNIQUE: Multidetector CT imaging of the abdomen and pelvis was performed using the standard protocol following bolus administration of intravenous contrast. RADIATION DOSE REDUCTION: This exam was performed according to the departmental dose-optimization program which includes automated exposure control, adjustment of the mA and/or kV according to patient size and/or use of iterative reconstruction technique. CONTRAST:  OMNIPAQUE IOHEXOL 300 MG/ML  SOLN COMPARISON:  Abdomen only CT on 12/10/2021 FINDINGS: Lower Chest: No acute findings. Hepatobiliary: Stable sub-cm low-attenuation lesion in the liver dome, consistent with benign etiology. No suspicious hepatic masses identified. Gallbladder is unremarkable. No evidence of biliary ductal dilatation. Pancreas:  No mass or inflammatory changes. Spleen: Marked splenomegaly measuring 18-19 cm in length shows no significant change. No splenic masses identified. Adrenals/Urinary Tract: No suspicious masses identified. No evidence of ureteral calculi or hydronephrosis. Stomach/Bowel: No evidence of obstruction, inflammatory process or abnormal fluid collections. Sigmoid diverticulosis noted, without signs of diverticulitis. Vascular/Lymphatic: No pathologically enlarged lymph nodes. No acute vascular findings. Reproductive: Prior hysterectomy noted. Adnexal regions are unremarkable in appearance. Other:  None. Musculoskeletal:  No suspicious bone lesions identified. IMPRESSION: Stable marked splenomegaly. No evidence of mass or lymphadenopathy.  No acute findings. Colonic diverticulosis, without radiographic evidence of diverticulitis. Electronically Signed   By: Danae Orleans M.D.   On: 09/14/2022 10:58

## 2022-09-19 ENCOUNTER — Inpatient Hospital Stay: Payer: Medicare Other | Attending: Hematology | Admitting: Hematology

## 2022-09-19 ENCOUNTER — Encounter: Payer: Self-pay | Admitting: Hematology

## 2022-09-19 VITALS — BP 185/61 | HR 52 | Temp 97.7°F | Resp 18 | Wt 146.7 lb

## 2022-09-19 DIAGNOSIS — Z8041 Family history of malignant neoplasm of ovary: Secondary | ICD-10-CM | POA: Insufficient documentation

## 2022-09-19 DIAGNOSIS — D378 Neoplasm of uncertain behavior of other specified digestive organs: Secondary | ICD-10-CM

## 2022-09-19 DIAGNOSIS — R161 Splenomegaly, not elsewhere classified: Secondary | ICD-10-CM | POA: Diagnosis not present

## 2022-09-19 DIAGNOSIS — Z950 Presence of cardiac pacemaker: Secondary | ICD-10-CM | POA: Insufficient documentation

## 2022-09-19 NOTE — Patient Instructions (Signed)
[facility]  Discharge Instructions  You were seen and examined today by Dr. Ellin Saba.  Dr. Ellin Saba discussed your most recent lab work and CT scan which revealed that your spleen is enlarged but all of the labs are stable.  Follow-up as scheduled.    Thank you for choosing  [facility] to provide your oncology and hematology care.   To afford each patient quality time with our provider, please arrive at least 15 minutes before your scheduled appointment time. You may need to reschedule your appointment if you arrive late (10 or more minutes). Arriving late affects you and other patients whose appointments are after yours.  Also, if you miss three or more appointments without notifying the office, you may be dismissed from the clinic at the provider's discretion.    Again, thank you for choosing [facility].  Our hope is that these requests will decrease the amount of time that you wait before being seen by our physicians.   If you have a lab appointment with the Cancer Center - please note that after April 8th, all labs will be drawn in the cancer center.  You do not have to check in or register with the main entrance as you have in the past but will complete your check-in at the cancer center.            _____________________________________________________________  Should you have questions after your visit to [facility], please contact our office at [phone] and follow the prompts.  Our office hours are 8:00 a.m. to 4:30 p.m. Monday - Thursday and 8:00 a.m. to 2:30 p.m. Friday.  Please note that voicemails left after 4:00 p.m. may not be returned until the following business day.  We are closed weekends and all major holidays.  You do have access to a nurse 24-7, just call the main number to the clinic [phone] and do not press any options, hold on the line and a nurse will answer the phone.     For prescription refill requests, have your pharmacy contact our office and allow 72 hours.    Masks are no longer required in the cancer centers. If you would like for your care team to wear a mask while they are taking care of you, please let them know. You may have one support person who is at least 78 years old accompany you for your appointments.

## 2022-09-23 NOTE — Telephone Encounter (Signed)
Forms were picked up by son in law

## 2022-09-25 ENCOUNTER — Ambulatory Visit: Payer: Medicare Other | Admitting: Allergy & Immunology

## 2022-09-26 ENCOUNTER — Encounter (HOSPITAL_COMMUNITY)
Admission: RE | Admit: 2022-09-26 | Discharge: 2022-09-26 | Disposition: A | Discharge status: Home / Self Care | Payer: Medicare Other | Source: Ambulatory Visit | Attending: Hematology | Admitting: Hematology

## 2022-09-26 DIAGNOSIS — D378 Neoplasm of uncertain behavior of other specified digestive organs: Secondary | ICD-10-CM

## 2022-09-26 DIAGNOSIS — R161 Splenomegaly, not elsewhere classified: Secondary | ICD-10-CM

## 2022-09-26 MED ORDER — FLUDEOXYGLUCOSE F - 18 (FDG) INJECTION
7.1900 | Freq: Once | INTRAVENOUS | Status: AC | PRN
  Administered 2022-09-26: 7.19 via INTRAVENOUS

## 2022-10-04 ENCOUNTER — Other Ambulatory Visit: Payer: Self-pay | Admitting: Internal Medicine

## 2022-10-10 ENCOUNTER — Inpatient Hospital Stay: Payer: Medicare Other | Attending: Hematology | Admitting: Hematology

## 2022-10-10 VITALS — HR 72 | Temp 98.4°F | Resp 16 | Ht 60.0 in | Wt 147.8 lb

## 2022-10-10 DIAGNOSIS — R161 Splenomegaly, not elsewhere classified: Secondary | ICD-10-CM | POA: Diagnosis not present

## 2022-10-10 NOTE — Progress Notes (Signed)
[name] [address], [place] [address]   Clinic Day:  10/10/22    Referring physician: Billie Lade, MD  Patient Care Team: Billie Lade, MD as PCP - General (Internal Medicine) Jonelle Sidle, MD as PCP - Cardiology (Cardiology) Marinus Maw, MD as PCP - Electrophysiology (Cardiology) Center, [facility] as Attending Physician Doreatha Massed, MD as Medical Oncologist (Hematology)   ASSESSMENT & PLAN:   Assessment:  1.  Splenomegaly: - Patient seen at the request of Dr. Durwin Nora for splenomegaly. - Ultrasound (11/2021): Suggestive of splenomegaly. - CT abdomen (12/10/2021): Splenomegaly 18 cm in craniocaudal dimension.  Subcentimeter hypodensity in segment 4 poorly visualized. - Ultrasound RUQ (07/25/2022): No liver mass visualized. - Denies any B symptoms or recurrent infections.  She reports mild day sweats for the past 1 year. - PET scan on 09/26/2022: Splenomegaly without hypermetabolism.  Prominent bilateral axillary and left external iliac lymph nodes with SUV at 1.7.  2.  Social/family history: - She lives by herself and is independent of ADLs and IADLs.  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  She now works at [employer] for [employer].  Non-smoker.  No chemical exposure. - No family history of hepatosplenomegaly.  Maternal great grandmother had ovarian cancer.  Plan:  1.  Splenomegaly: - I have reviewed PET scan images with the patient.  No uptake in the spleen or subcentimeter lymph nodes in the axillary region and left external iliac region. - She does not have any B symptoms or early satiety. - She had stable splenomegaly dating back to November 2023. - I have recommended that we monitor it at this time.  Recommend CT scan of the abdomen and pelvis in 6 months with repeat labs.  She is agreeable.  Orders Placed This Encounter  Procedures   CT ABDOMEN PELVIS W CONTRAST    Standing Status:   Future    Standing  Expiration Date:   10/10/2023    Order Specific Question:   If indicated for the ordered procedure, I authorize the administration of contrast media per Radiology protocol    Answer:   Yes    Order Specific Question:   Does the patient have a contrast media/X-ray dye allergy?    Answer:   No    Order Specific Question:   Preferred imaging location?    Answer:   [facility]    Order Specific Question:   If indicated for the ordered procedure, I authorize the administration of oral contrast media per Radiology protocol    Answer:   Yes   CBC with Differential    Standing Status:   Future    Standing Expiration Date:   10/10/2023   Comprehensive metabolic panel    Standing Status:   Future    Standing Expiration Date:   10/10/2023   Lactate dehydrogenase    Standing Status:   Future    Standing Expiration Date:   10/10/2023      Amy [name],acting as a scribe for Doreatha Massed, MD.,have documented all relevant documentation on the behalf of Doreatha Massed, MD,as directed by  Doreatha Massed, MD while in the presence of Doreatha Massed, MD.  I, Doreatha Massed MD, have reviewed the above documentation for accuracy and completeness, and I agree with the above.     Doreatha Massed, MD   9/5/20246:25 PM  CHIEF COMPLAINT/PURPOSE OF CONSULT:   Diagnosis: Splenomegaly   Cancer Staging  No matching staging information  was found for the patient.    Prior Therapy: None  Current Therapy: Under workup   HISTORY OF PRESENT ILLNESS:   Amy [name] is a 77 y.o. female presenting to clinic today for evaluation of an enlarged spleen at the request of Dr. Levon Hedger.  Patient had an [place] in October 2023 that showed an 18 cm spleen.   Patient underwent an US of the abdomen limited to the RUQ on 6/28 that showed the known mass in the hepatic dome is not visualized on today's study. The mass is low suspicion on CT imaging.  Her recent CBC on 05/21/22 showed abnormal  ALC at 13, L at 26%, and G at 66%.   Today, she states that she is doing well overall. Her appetite level is at 100%. Her energy level is at 80%. She is accompanied by her daughter.   She reports she has had multiple scans that showed an enlarged spleen. Patient reports she has a pacemaker, so she could not have an MRI.   She notes she had anemia and a hysterectomy. Her mother had anemia. She notes her maternal great-great grandmother had ovarian cancer. She denies family history of hereditary splenocytosis. She reports maternal and paternal family members have died from heart diseases or strokes.   She reports of feeling full after eating and bloating. She denies fever, night sweats, weight loss, recurrent infections. She denies ever having infectious mononucleosis.   She had a stomach virus which caused a high fever over 2 years ago. She notes she fell on a metal trash can 4 years ago to her right side. She had to receive physical therapy and could not walk at first. She reportedly broke her ribs.   She currently works at [employer] for [employer]. She used to do office work. She is able to do activities around the house without assistance. She denies any chemical exposure, smoking, or consuming alcohol.   INTERVAL HISTORY:   Amy [name] is a 77 y.o. female presenting to the clinic today for follow-up of Splenomegaly. She was last seen by me on 09/19/22.  Since her last visit, she underwent an initial PET on 09/26/22 that found: splenomegaly without splenic hypermetabolism; prominent bilateral axillary and left external iliac nodes with low-level, nonspecific hypermetabolism, most likely reactive; and incidental findings of coronary artery atherosclerosis and Aortic Atherosclerosis.   Today, she states that she is doing well overall. Her appetite level is at 100%. Her energy level is at 80%. She is accompanied by family.   She has not had any recent vaccines, nor does she have any pets. She  frequently visits her daughter and her niece, both of whom have pets.   She c/o occasional left flank pain.   PAST MEDICAL HISTORY:   Past Medical History: Past Medical History:  Diagnosis Date   Asthma    CAD (coronary artery disease)    60-70% proximal LAD/diagonal bifurcational disease October 2015 at [facility] status DES x 2 to LAD/diagonal October 2015   Essential hypertension    Hyperlipidemia    Hypothyroidism    Sinus node dysfunction (HCC)    Biotronik pacemaker in place    Surgical History: Past Surgical History:  Procedure Laterality Date   ABDOMINAL HYSTERECTOMY     ANAL FISSURE REPAIR     CATARACT EXTRACTION W/PHACO Right 10/17/2014   Procedure: CATARACT EXTRACTION PHACO AND INTRAOCULAR LENS PLACEMENT (IOC);  Surgeon: Gemma Payor, MD;  Location: [facility];  Service: Ophthalmology;  Laterality: Right;  CDE: 4.88  CATARACT EXTRACTION W/PHACO Left 10/27/2014   Procedure: CATARACT EXTRACTION PHACO AND INTRAOCULAR LENS PLACEMENT (IOC);  Surgeon: Gemma Payor, MD;  Location: [facility];  Service: Ophthalmology;  Laterality: Left;  CDE:7.86   COLONOSCOPY N/A 04/16/2018   Procedure: COLONOSCOPY;  Surgeon: Malissa Hippo, MD;  Location: [facility] SUITE;  Service: Endoscopy;  Laterality: N/A;  830   FINGER SURGERY     NASAL SINUS SURGERY     PACEMAKER IMPLANT N/A 07/30/2016   Procedure: Pacemaker Implant Dual Chamber;  Surgeon: Marinus Maw, MD;  Location: [facility] INVASIVE CV LAB;  Service: Cardiovascular;  Laterality: N/A;   TONSILLECTOMY      Social History: Social History   Socioeconomic History   Marital status: Widowed    Spouse name: Not on file   Number of children: Not on file   Years of education: Not on file   Highest education level: Not on file  Occupational History   Not on file  Tobacco Use   Smoking status: Never   Smokeless tobacco: Never  Vaping Use   Vaping status: Never Used  Substance and Sexual Activity   Alcohol use: No    Alcohol/week: 0.0 standard  drinks of alcohol   Drug use: No   Sexual activity: Never  Other Topics Concern   Not on file  Social History Narrative   Not on file   Social Determinants of Health   Financial Resource Strain: Low Risk  (08/19/2022)   Overall Financial Resource Strain (CARDIA)    Difficulty of Paying Living Expenses: Not hard at all  Food Insecurity: No Food Insecurity (08/19/2022)   Hunger Vital Sign    Worried About Running Out of Food in the Last Year: Never true    Ran Out of Food in the Last Year: Never true  Transportation Needs: No Transportation Needs (08/19/2022)   PRAPARE - Administrator, Civil Service (Medical): No    Lack of Transportation (Non-Medical): No  Physical Activity: Sufficiently Active (08/19/2022)   Exercise Vital Sign    Days of Exercise per Week: 7 days    Minutes of Exercise per Session: 60 min  Stress: No Stress Concern Present (08/19/2022)   [name] of Occupational Health - Occupational Stress Questionnaire    Feeling of Stress : Not at all  Social Connections: Socially Integrated (08/19/2022)   Social Connection and Isolation Panel [NHANES]    Frequency of Communication with Friends and Family: More than three times a week    Frequency of Social Gatherings with Friends and Family: More than three times a week    Attends Religious Services: More than 4 times per year    Active Member of [employer] or Organizations: Yes    Attends Engineer, structural: More than 4 times per year    Marital Status: Married  Catering manager Violence: Not At Risk (08/19/2022)   Humiliation, Afraid, Rape, and Kick questionnaire    Fear of Current or Ex-Partner: No    Emotionally Abused: No    Physically Abused: No    Sexually Abused: No    Family History: Family History  Problem Relation Age of Onset   Heart disease Father     Current Medications:  Current Outpatient Medications:    acetaminophen (TYLENOL) 650 MG CR tablet, Take 650 mg by mouth daily  as needed for pain., Disp: , Rfl:    aspirin 81 MG tablet, Take 81 mg by mouth daily., Disp: , Rfl:    atorvastatin (  LIPITOR) 20 MG tablet, Take 20 mg by mouth at bedtime. , Disp: , Rfl: 3   calcium carbonate (OSCAL) 1500 (600 Ca) MG TABS tablet, Take by mouth 2 (two) times daily with a meal., Disp: , Rfl:    Cholecalciferol (VITAMIN D3) 2000 units capsule, Take 2,000 Units by mouth daily at 12 noon. , Disp: , Rfl:    clobetasol (TEMOVATE) 0.05 % external solution, Apply 1 application topically daily as needed (psoriasis). , Disp: , Rfl:    clopidogrel (PLAVIX) 75 MG tablet, TAKE 1 TABLET BY MOUTH DAILY, Disp: 90 tablet, Rfl: 3   hydrochlorothiazide (HYDRODIURIL) 12.5 MG tablet, Take 12.5 mg by mouth daily., Disp: , Rfl:    ipratropium (ATROVENT) 0.03 % nasal spray, Place 2 sprays into both nostrils 3 (three) times daily., Disp: 30 mL, Rfl: 5   isosorbide mononitrate (IMDUR) 30 MG 24 hr tablet, TAKE 1 TABLET BY MOUTH AT BEDTIME, Disp: 30 tablet, Rfl: 2   levothyroxine (SYNTHROID) 75 MCG tablet, Take 75 mcg by mouth every morning., Disp: , Rfl:    lisinopril (ZESTRIL) 10 MG tablet, Take 1 tablet (10 mg total) by mouth in the morning and at bedtime., Disp: 60 tablet, Rfl: 2   Multiple Vitamin (MULTIVITAMIN WITH MINERALS) TABS tablet, Take 1 tablet by mouth daily at 12 noon. , Disp: , Rfl:    omeprazole (PRILOSEC) 40 MG capsule, Take 40 mg by mouth daily., Disp: , Rfl:    triamcinolone cream (KENALOG) 0.1 %, Apply 1 application topically daily as needed (psoriasis). Mixed with nystatin cream 1:1, Disp: , Rfl:    Allergies: Allergies  Allergen Reactions   Sulfa Antibiotics Rash    REVIEW OF SYSTEMS:   Review of Systems  Constitutional:  Negative for chills, fatigue and fever.  HENT:   Negative for lump/mass, mouth sores, nosebleeds, sore throat and trouble swallowing.   Eyes:  Negative for eye problems.  Respiratory:  Positive for cough. Negative for shortness of breath.   Cardiovascular:   Negative for chest pain, leg swelling and palpitations.  Gastrointestinal:  Negative for abdominal pain, constipation, diarrhea, nausea and vomiting.  Genitourinary:  Negative for bladder incontinence, difficulty urinating, dysuria, frequency, hematuria and nocturia.   Musculoskeletal:  Negative for arthralgias, back pain, flank pain, myalgias and neck pain.  Skin:  Negative for itching and rash.  Neurological:  Negative for dizziness, headaches and numbness.  Hematological:  Does not bruise/bleed easily.  Psychiatric/Behavioral:  Negative for depression, sleep disturbance and suicidal ideas. The patient is not nervous/anxious.   All other systems reviewed and are negative.    VITALS:   Pulse 72, temperature 98.4 F (36.9 C), resp. rate 16, height 5' (1.524 m), weight 147 lb 12.8 oz (67 kg), SpO2 100%.  Wt Readings from Last 3 Encounters:  10/10/22 147 lb 12.8 oz (67 kg)  09/19/22 146 lb 11.2 oz (66.5 kg)  09/09/22 147 lb (66.7 kg)    Body mass index is 28.87 kg/m.  Performance status (ECOG): 1 - Symptomatic but completely ambulatory  PHYSICAL EXAM:   Physical Exam Vitals and nursing note reviewed. Exam conducted with a chaperone present.  Constitutional:      Appearance: Normal appearance.  Cardiovascular:     Rate and Rhythm: Normal rate and regular rhythm.     Pulses: Normal pulses.     Heart sounds: Normal heart sounds.  Pulmonary:     Effort: Pulmonary effort is normal.     Breath sounds: Normal breath sounds.  Abdominal:  Palpations: Abdomen is soft. There is splenomegaly. There is no hepatomegaly or mass.     Tenderness: There is no abdominal tenderness.  Musculoskeletal:     Right lower leg: No edema.     Left lower leg: No edema.  Lymphadenopathy:     Cervical: No cervical adenopathy.     Right cervical: No superficial, deep or posterior cervical adenopathy.    Left cervical: No superficial, deep or posterior cervical adenopathy.     Upper Body:     Right  upper body: No supraclavicular or axillary adenopathy.     Left upper body: No supraclavicular or axillary adenopathy.  Neurological:     General: No focal deficit present.     Mental Status: She is alert and oriented to person, place, and time.  Psychiatric:        Mood and Affect: Mood normal.        Behavior: Behavior normal.     LABS:      Latest Ref Rng & Units 08/16/2022   10:36 AM 06/15/2020    9:48 PM 07/26/2016    9:08 AM  CBC  WBC 4.0 - 10.5 K/uL 6.1  15.4  5.4   Hemoglobin 12.0 - 15.0 g/dL 09.8  11.9  14.7   Hematocrit 36.0 - 46.0 % 34.6  41.9  36.4   Platelets 150 - 400 K/uL 161  173  192       Latest Ref Rng & Units 07/16/2022    2:12 PM 06/15/2020    9:48 PM 07/26/2016    9:08 AM  CMP  Glucose 70 - 99 mg/dL  829  90   BUN 8 - 23 mg/dL  29  19   Creatinine 5.62 - 1.00 mg/dL  1.30  8.65   Sodium 784 - 145 mmol/L  127  137   Potassium 3.5 - 5.1 mmol/L  3.8  4.7   Chloride 98 - 111 mmol/L  92  99   CO2 22 - 32 mmol/L  25  24   Calcium 8.9 - 10.3 mg/dL  9.0  9.4   Total Protein 6.5 - 8.1 g/dL  7.8    Total Bilirubin 0.0 - 1.2 mg/dL 1.1  1.8    Alkaline Phos 38 - 126 U/L  109    AST 15 - 41 U/L  29    ALT 0 - 44 U/L  29       No results found for: "CEA1", "CEA" / No results found for: "CEA1", "CEA" No results found for: "PSA1" No results found for: "ONG295" No results found for: "CAN125"  No results found for: "TOTALPROTELP", "ALBUMINELP", "A1GS", "A2GS", "BETS", "BETA2SER", "GAMS", "MSPIKE", "SPEI" No results found for: "TIBC", "FERRITIN", "IRONPCTSAT" Lab Results  Component Value Date   LDH 136 08/16/2022     STUDIES:   NM PET Image Initial (PI) Skull Base To Thigh  Result Date: 10/03/2022 CLINICAL DATA:  Initial treatment strategy for hepatosplenomegaly. EXAM: NUCLEAR MEDICINE PET SKULL BASE TO THIGH TECHNIQUE: 7.2 mCi F-18 FDG was injected intravenously. Full-ring PET imaging was performed from the skull base to thigh after the radiotracer. CT data  was obtained and used for attenuation correction and anatomic localization. Fasting blood glucose: 114 mg/dl COMPARISON:  Abdominopelvic CT of 09/12/2022. FINDINGS: Mediastinal blood pool activity: SUV max 2.2 Liver activity: SUV max 4.0 NECK: No areas of abnormal hypermetabolism. Incidental CT findings: Dense bilateral carotid atherosclerosis. Multiple small bilateral posterior triangle nodes, none pathologic by size criteria. CHEST: No pulmonary  parenchymal hypermetabolism. Multiple small bilateral axillary and subpectoral nodes. Example left axillary node maintains its fatty hilum at 7 mm and a S.U.V. max of 1.7 on 58/202. Incidental CT findings: Pacer/ICD. Mild cardiomegaly. Lad stent. Multivessel coronary artery atherosclerosis. Aortic atherosclerosis. Double SVC. ABDOMEN/PELVIS: No significant splenic hypermetabolism. Left external iliac node measures 1.0 cm and a S.U.V. max of 1.7 on 123/202. Incidental CT findings: Deferred to recent diagnostic CT. Normal adrenal glands. No renal calculi or hydronephrosis. Abdominal aortic atherosclerosis. Hysterectomy. Pelvic floor laxity. SKELETON: No abnormal marrow activity. Incidental CT findings: None. IMPRESSION: 1. Splenomegaly without splenic hypermetabolism. 2. Prominent bilateral axillary and left external iliac nodes with low-level, nonspecific hypermetabolism. Most likely reactive. An indolent lymphoproliferative process could look similar. 3. Incidental findings, including: Coronary artery atherosclerosis. Aortic Atherosclerosis (ICD10-I70.0). Electronically Signed   By: Jeronimo Greaves M.D.   On: 10/03/2022 16:40   CT ABDOMEN PELVIS W CONTRAST  Result Date: 09/14/2022 CLINICAL DATA:  Splenomegaly. EXAM: CT ABDOMEN AND PELVIS WITH CONTRAST TECHNIQUE: Multidetector CT imaging of the abdomen and pelvis was performed using the standard protocol following bolus administration of intravenous contrast. RADIATION DOSE REDUCTION: This exam was performed according to  the departmental dose-optimization program which includes automated exposure control, adjustment of the mA and/or kV according to patient size and/or use of iterative reconstruction technique. CONTRAST:  OMNIPAQUE IOHEXOL 300 MG/ML  SOLN COMPARISON:  Abdomen only CT on 12/10/2021 FINDINGS: Lower Chest: No acute findings. Hepatobiliary: Stable sub-cm low-attenuation lesion in the liver dome, consistent with benign etiology. No suspicious hepatic masses identified. Gallbladder is unremarkable. No evidence of biliary ductal dilatation. Pancreas:  No mass or inflammatory changes. Spleen: Marked splenomegaly measuring 18-19 cm in length shows no significant change. No splenic masses identified. Adrenals/Urinary Tract: No suspicious masses identified. No evidence of ureteral calculi or hydronephrosis. Stomach/Bowel: No evidence of obstruction, inflammatory process or abnormal fluid collections. Sigmoid diverticulosis noted, without signs of diverticulitis. Vascular/Lymphatic: No pathologically enlarged lymph nodes. No acute vascular findings. Reproductive: Prior hysterectomy noted. Adnexal regions are unremarkable in appearance. Other:  None. Musculoskeletal:  No suspicious bone lesions identified. IMPRESSION: Stable marked splenomegaly. No evidence of mass or lymphadenopathy.  No acute findings. Colonic diverticulosis, without radiographic evidence of diverticulitis. Electronically Signed   By: Danae Orleans M.D.   On: 09/14/2022 10:58

## 2022-10-10 NOTE — Patient Instructions (Addendum)
[facility] at [facility] Discharge Instructions   You were seen and examined today by Dr. Ellin Saba.  He reviewed the results of your PET scan which is normal. There are tiny lymph nodes under your right arm. There are also some tiny ones in the pelvis. These are not bright or big. It is unlikely this is coming from lymphoma. The spleen is also not lighting up.  We will see you back in 6 months. We will repeat a CT scan prior to your next visit.   Return as scheduled.      Thank you for choosing [facility] at [facility] to provide your oncology and hematology care.  To afford each patient quality time with our provider, please arrive at least 15 minutes before your scheduled appointment time.   If you have a lab appointment with the Cancer Center please come in thru the Main Entrance and check in at the main information desk.  You need to re-schedule your appointment should you arrive 10 or more minutes late.  We strive to give you quality time with our providers, and arriving late affects you and other patients whose appointments are after yours.  Also, if you no show three or more times for appointments you may be dismissed from the clinic at the providers discretion.     Again, thank you for choosing [facility].  Our hope is that these requests will decrease the amount of time that you wait before being seen by our physicians.       _____________________________________________________________  Should you have questions after your visit to [facility], please contact our office at [phone] and follow the prompts.  Our office hours are 8:00 a.m. and 4:30 p.m. Monday - Friday.  Please note that voicemails left after 4:00 p.m. may not be returned until the following business day.  We are closed weekends and major holidays.  You do have access to a nurse 24-7, just call the main number to the clinic [phone]  and do not press any options, hold on the line and a nurse will answer the phone.    For prescription refill requests, have your pharmacy contact our office and allow 72 hours.    Due to Covid, you will need to wear a mask upon entering the hospital. If you do not have a mask, a mask will be given to you at the Main Entrance upon arrival. For doctor visits, patients may have 1 support person age 62 or older with them. For treatment visits, patients can not have anyone with them due to social distancing guidelines and our immunocompromised population.

## 2022-10-22 ENCOUNTER — Other Ambulatory Visit: Payer: Self-pay | Admitting: Internal Medicine

## 2022-10-25 ENCOUNTER — Ambulatory Visit: Payer: Medicare Other | Admitting: Allergy & Immunology

## 2022-11-01 ENCOUNTER — Telehealth: Payer: Self-pay | Admitting: Internal Medicine

## 2022-11-01 ENCOUNTER — Ambulatory Visit: Payer: Medicare Other

## 2022-11-01 ENCOUNTER — Encounter: Payer: Self-pay | Admitting: Internal Medicine

## 2022-11-01 ENCOUNTER — Ambulatory Visit (INDEPENDENT_AMBULATORY_CARE_PROVIDER_SITE_OTHER): Payer: Medicare Other | Admitting: Internal Medicine

## 2022-11-01 VITALS — BP 173/82 | HR 72 | Ht 60.0 in | Wt 146.4 lb

## 2022-11-01 DIAGNOSIS — Z23 Encounter for immunization: Secondary | ICD-10-CM | POA: Insufficient documentation

## 2022-11-01 DIAGNOSIS — I10 Essential (primary) hypertension: Secondary | ICD-10-CM

## 2022-11-01 DIAGNOSIS — I495 Sick sinus syndrome: Secondary | ICD-10-CM

## 2022-11-01 DIAGNOSIS — R161 Splenomegaly, not elsewhere classified: Secondary | ICD-10-CM

## 2022-11-01 LAB — CUP PACEART REMOTE DEVICE CHECK
Date Time Interrogation Session: 20240927091033
Implantable Lead Connection Status: 753985
Implantable Lead Connection Status: 753985
Implantable Lead Implant Date: 20180626
Implantable Lead Implant Date: 20180626
Implantable Lead Location: 753859
Implantable Lead Location: 753860
Implantable Lead Model: 377
Implantable Lead Model: 377
Implantable Lead Serial Number: 49791993
Implantable Lead Serial Number: 49865965
Implantable Pulse Generator Implant Date: 20180626
Pulse Gen Model: 407145
Pulse Gen Serial Number: 69052081

## 2022-11-01 MED ORDER — ISOSORBIDE MONONITRATE ER 60 MG PO TB24: 60.0000 mg | ORAL_TABLET | Freq: Every day | ORAL | 2 refills | Status: DC

## 2022-11-01 NOTE — Patient Instructions (Signed)
It was a pleasure to see you today.  Thank you for giving [place] the opportunity to be involved in your care.  Below is a brief recap of your visit and next steps.  We will plan to see you again in 6 weeks.  Summary Increase Imdur to 60 mg daily Follow up in 6 weeks for BP check Flu shot today

## 2022-11-01 NOTE — Assessment & Plan Note (Signed)
Her chief concern today is poorly controlled HTN.  Current antihypertensive regimen consists of Imdur 30 mg daily, lisinopril 10 mg twice daily, and HCTZ 12.5 mg daily.  She endorses compliance with this regimen.  BP readings today are 173/82 and 169/72.  She regularly checks her blood pressure at home and reports systolic readings ranging 140-170 mmHg. -Increase Imdur to 60 mg daily.  Continue lisinopril and HCTZ as currently prescribed. -Follow-up in 6 weeks for HTN check

## 2022-11-01 NOTE — Assessment & Plan Note (Signed)
Influenza vaccine administered today.

## 2022-11-01 NOTE — Assessment & Plan Note (Signed)
She has established care with hematology/oncology (Dr. Ellin Saba).  Workup thus far, including labs and imaging, has been largely unrevealing.  Splenomegaly deemed stable.  She will follow-up in 6 months for repeat labs and imaging.

## 2022-11-01 NOTE — Telephone Encounter (Signed)
Patient maxed out for AWV benefit. Patient has been followed up with regarding invoice.

## 2022-11-01 NOTE — Telephone Encounter (Signed)
Patient seen today in our office. She  asking why was charged for her annual wellness visit DOS 07.15.2024.  Has medicare primary.

## 2022-11-01 NOTE — Progress Notes (Signed)
Established Patient Office Visit  Subjective   Patient ID: Amy [name], female    DOB: 12-16-1945  Age: 77 y.o. MRN: [id number]  Chief Complaint  Patient presents with   Hypertension    Three month follow up    Amy [name] returns to care today for follow-up.  She was last evaluated by me on 6/27 for HTN follow-up.  Lisinopril was increased to 10 mg twice daily at that time.  No additional medication changes were made and 58-month follow-up was arranged.  In the interim, she has been evaluated by oncology in the setting of splenomegaly, last evaluated on 9/5.  Workup has been largely unremarkable.  Splenomegaly characterizes stable.  She will follow-up for repeat CT scan and labs in 6 months.  She has also been seen by cardiology for follow-up.  There have otherwise been no acute interval events.  Amy [name] reports feeling fairly well today.  Her acute concern is persistently elevated blood pressure readings.  She has been taking her antihypertensive medications as prescribed and checking her blood pressure regularly at home.  Systolic readings at home range 140-170 mmHg.  Past Medical History:  Diagnosis Date   Asthma    CAD (coronary artery disease)    60-70% proximal LAD/diagonal bifurcational disease October 2015 at [facility] status DES x 2 to LAD/diagonal October 2015   Essential hypertension    Hyperlipidemia    Hypothyroidism    Sinus node dysfunction (HCC)    Biotronik pacemaker in place   Past Surgical History:  Procedure Laterality Date   ABDOMINAL HYSTERECTOMY     ANAL FISSURE REPAIR     CATARACT EXTRACTION W/PHACO Right 10/17/2014   Procedure: CATARACT EXTRACTION PHACO AND INTRAOCULAR LENS PLACEMENT (IOC);  Surgeon: Gemma Payor, MD;  Location: [facility] ORS;  Service: Ophthalmology;  Laterality: Right;  CDE: 4.88   CATARACT EXTRACTION W/PHACO Left 10/27/2014   Procedure: CATARACT EXTRACTION PHACO AND INTRAOCULAR LENS PLACEMENT (IOC);  Surgeon: Gemma Payor, MD;  Location: [facility];  Service:  Ophthalmology;  Laterality: Left;  CDE:7.86   COLONOSCOPY N/A 04/16/2018   Procedure: COLONOSCOPY;  Surgeon: Malissa Hippo, MD;  Location: [facility] ENDO SUITE;  Service: Endoscopy;  Laterality: N/A;  830   FINGER SURGERY     NASAL SINUS SURGERY     PACEMAKER IMPLANT N/A 07/30/2016   Procedure: Pacemaker Implant Dual Chamber;  Surgeon: Marinus Maw, MD;  Location: [facility] INVASIVE CV LAB;  Service: Cardiovascular;  Laterality: N/A;   TONSILLECTOMY     Social History   Tobacco Use   Smoking status: Never   Smokeless tobacco: Never  Vaping Use   Vaping status: Never Used  Substance Use Topics   Alcohol use: No    Alcohol/week: 0.0 standard drinks of alcohol   Drug use: No   Family History  Problem Relation Age of Onset   Heart disease Father    Allergies  Allergen Reactions   Sulfa Antibiotics Rash   Review of Systems  Constitutional:  Negative for chills and fever.  HENT:  Negative for sore throat.   Respiratory:  Negative for cough and shortness of breath.   Cardiovascular:  Negative for chest pain, palpitations and leg swelling.  Gastrointestinal:  Negative for abdominal pain, blood in stool, constipation, diarrhea, nausea and vomiting.  Genitourinary:  Negative for dysuria and hematuria.  Musculoskeletal:  Negative for myalgias.  Skin:  Negative for itching and rash.  Neurological:  Negative for dizziness and headaches.  Psychiatric/Behavioral:  Negative for depression and  suicidal ideas.      Objective:     BP (!) 173/82 (BP Location: Right Arm, Patient Position: Sitting, Cuff Size: Normal)   Pulse 72   Ht 5' (1.524 m)   Wt 146 lb 6.4 oz (66.4 kg)   SpO2 98%   BMI 28.59 kg/m  BP Readings from Last 3 Encounters:  11/01/22 (!) 173/82  09/19/22 (!) 185/61  09/09/22 138/69   Physical Exam Vitals reviewed.  Constitutional:      General: She is not in acute distress.    Appearance: Normal appearance. She is not toxic-appearing.  HENT:     Head: Normocephalic and  atraumatic.     Right Ear: External ear normal.     Left Ear: External ear normal.     Nose: Nose normal. No congestion or rhinorrhea.     Mouth/Throat:     Mouth: Mucous membranes are moist.     Pharynx: Oropharynx is clear. No oropharyngeal exudate or posterior oropharyngeal erythema.  Eyes:     General: No scleral icterus.    Extraocular Movements: Extraocular movements intact.     Conjunctiva/sclera: Conjunctivae normal.     Pupils: Pupils are equal, round, and reactive to light.  Cardiovascular:     Rate and Rhythm: Normal rate and regular rhythm.     Pulses: Normal pulses.     Heart sounds: Murmur heard.     No friction rub. No gallop.  Pulmonary:     Effort: Pulmonary effort is normal.     Breath sounds: Normal breath sounds. No wheezing, rhonchi or rales.  Abdominal:     General: Abdomen is flat. Bowel sounds are normal. There is no distension.     Palpations: Abdomen is soft.     Tenderness: There is no abdominal tenderness.  Musculoskeletal:        General: No swelling. Normal range of motion.     Cervical back: Normal range of motion.     Right lower leg: No edema.     Left lower leg: No edema.  Lymphadenopathy:     Cervical: No cervical adenopathy.  Skin:    General: Skin is warm and dry.     Capillary Refill: Capillary refill takes less than 2 seconds.     Coloration: Skin is not jaundiced.  Neurological:     General: No focal deficit present.     Mental Status: She is alert and oriented to person, place, and time.  Psychiatric:        Mood and Affect: Mood normal.        Behavior: Behavior normal.   Last CBC Lab Results  Component Value Date   WBC 6.1 08/16/2022   HGB 11.7 (L) 08/16/2022   HCT 34.6 (L) 08/16/2022   MCV 86.3 08/16/2022   MCH 29.2 08/16/2022   RDW 13.3 08/16/2022   PLT 161 08/16/2022   Last metabolic panel Lab Results  Component Value Date   GLUCOSE 151 (H) 06/15/2020   NA 127 (L) 06/15/2020   K 3.8 06/15/2020   CL 92 (L)  06/15/2020   CO2 25 06/15/2020   BUN 29 (H) 06/15/2020   CREATININE 1.04 (H) 06/15/2020   EGFR 51.5 05/20/2022   CALCIUM 9.0 06/15/2020   PROT 7.8 06/15/2020   ALBUMIN 4.4 06/15/2020   BILITOT 1.1 07/16/2022   ALKPHOS 109 06/15/2020   AST 29 06/15/2020   ALT 29 06/15/2020   ANIONGAP 10 06/15/2020     Assessment & Plan:   Problem List  Items Addressed This Visit     Essential hypertension - Primary    Her chief concern today is poorly controlled HTN.  Current antihypertensive regimen consists of Imdur 30 mg daily, lisinopril 10 mg twice daily, and HCTZ 12.5 mg daily.  She endorses compliance with this regimen.  BP readings today are 173/82 and 169/72.  She regularly checks her blood pressure at home and reports systolic readings ranging 140-170 mmHg. -Increase Imdur to 60 mg daily.  Continue lisinopril and HCTZ as currently prescribed. -Follow-up in 6 weeks for HTN check      Splenomegaly    She has established care with hematology/oncology (Dr. Ellin Saba).  Workup thus far, including labs and imaging, has been largely unrevealing.  Splenomegaly deemed stable.  She will follow-up in 6 months for repeat labs and imaging.      Need for influenza vaccination    Influenza vaccine administered today      Return in about 6 weeks (around 12/13/2022) for HTN.   Billie Lade, MD

## 2022-11-03 ENCOUNTER — Other Ambulatory Visit: Payer: Self-pay | Admitting: Internal Medicine

## 2022-11-03 DIAGNOSIS — I10 Essential (primary) hypertension: Secondary | ICD-10-CM

## 2022-11-07 NOTE — Progress Notes (Signed)
Remote pacemaker transmission.   

## 2022-11-15 ENCOUNTER — Encounter: Payer: Self-pay | Admitting: Family Medicine

## 2022-11-15 ENCOUNTER — Emergency Department (HOSPITAL_COMMUNITY)
Admission: EM | Admit: 2022-11-15 | Discharge: 2022-11-15 | Disposition: A | Discharge status: Home / Self Care | Payer: Medicare Other | Attending: Emergency Medicine | Admitting: Emergency Medicine

## 2022-11-15 ENCOUNTER — Ambulatory Visit (INDEPENDENT_AMBULATORY_CARE_PROVIDER_SITE_OTHER): Payer: Medicare Other | Admitting: Family Medicine

## 2022-11-15 ENCOUNTER — Encounter (HOSPITAL_COMMUNITY): Payer: Self-pay

## 2022-11-15 ENCOUNTER — Other Ambulatory Visit: Payer: Self-pay

## 2022-11-15 VITALS — BP 172/68 | HR 73 | Ht 59.0 in | Wt 145.0 lb

## 2022-11-15 DIAGNOSIS — I2510 Atherosclerotic heart disease of native coronary artery without angina pectoris: Secondary | ICD-10-CM | POA: Diagnosis not present

## 2022-11-15 DIAGNOSIS — E039 Hypothyroidism, unspecified: Secondary | ICD-10-CM | POA: Insufficient documentation

## 2022-11-15 DIAGNOSIS — Z79899 Other long term (current) drug therapy: Secondary | ICD-10-CM | POA: Insufficient documentation

## 2022-11-15 DIAGNOSIS — I10 Essential (primary) hypertension: Secondary | ICD-10-CM | POA: Diagnosis not present

## 2022-11-15 DIAGNOSIS — R197 Diarrhea, unspecified: Secondary | ICD-10-CM | POA: Diagnosis not present

## 2022-11-15 DIAGNOSIS — Z7902 Long term (current) use of antithrombotics/antiplatelets: Secondary | ICD-10-CM | POA: Diagnosis not present

## 2022-11-15 DIAGNOSIS — I1A0 Resistant hypertension: Secondary | ICD-10-CM | POA: Insufficient documentation

## 2022-11-15 DIAGNOSIS — Z79890 Hormone replacement therapy (postmenopausal): Secondary | ICD-10-CM | POA: Insufficient documentation

## 2022-11-15 DIAGNOSIS — Z7982 Long term (current) use of aspirin: Secondary | ICD-10-CM | POA: Insufficient documentation

## 2022-11-15 LAB — COMPREHENSIVE METABOLIC PANEL
ALT: 20 U/L (ref 0–44)
AST: 26 U/L (ref 15–41)
Albumin: 3.5 g/dL (ref 3.5–5.0)
Alkaline Phosphatase: 96 U/L (ref 38–126)
Anion gap: 7 (ref 5–15)
BUN: 20 mg/dL (ref 8–23)
CO2: 25 mmol/L (ref 22–32)
Calcium: 8.3 mg/dL — ABNORMAL LOW (ref 8.9–10.3)
Chloride: 101 mmol/L (ref 98–111)
Creatinine, Ser: 1.02 mg/dL — ABNORMAL HIGH (ref 0.44–1.00)
GFR, Estimated: 56 mL/min — ABNORMAL LOW (ref >60)
Glucose, Bld: 155 mg/dL — ABNORMAL HIGH (ref 70–99)
Potassium: 3.3 mmol/L — ABNORMAL LOW (ref 3.5–5.1)
Sodium: 133 mmol/L — ABNORMAL LOW (ref 135–145)
Total Bilirubin: 1 mg/dL (ref 0.3–1.2)
Total Protein: 6 g/dL — ABNORMAL LOW (ref 6.5–8.1)

## 2022-11-15 LAB — CBC WITH DIFFERENTIAL/PLATELET
Abs Immature Granulocytes: 0.01 10*3/uL (ref 0.00–0.07)
Basophils Absolute: 0 10*3/uL (ref 0.0–0.1)
Basophils Relative: 0 %
Eosinophils Absolute: 0.2 10*3/uL (ref 0.0–0.5)
Eosinophils Relative: 4 %
HCT: 34.4 % — ABNORMAL LOW (ref 36.0–46.0)
Hemoglobin: 11.1 g/dL — ABNORMAL LOW (ref 12.0–15.0)
Immature Granulocytes: 0 %
Lymphocytes Relative: 27 %
Lymphs Abs: 1.4 10*3/uL (ref 0.7–4.0)
MCH: 28.5 pg (ref 26.0–34.0)
MCHC: 32.3 g/dL (ref 30.0–36.0)
MCV: 88.2 fL (ref 80.0–100.0)
Monocytes Absolute: 0.4 10*3/uL (ref 0.1–1.0)
Monocytes Relative: 9 %
Neutro Abs: 3.1 10*3/uL (ref 1.7–7.7)
Neutrophils Relative %: 60 %
Platelets: 146 10*3/uL — ABNORMAL LOW (ref 150–400)
RBC: 3.9 MIL/uL (ref 3.87–5.11)
RDW: 14.2 % (ref 11.5–15.5)
WBC: 5.2 10*3/uL (ref 4.0–10.5)
nRBC: 0 % (ref 0.0–0.2)

## 2022-11-15 LAB — MAGNESIUM: Magnesium: 1.6 mg/dL — ABNORMAL LOW (ref 1.7–2.4)

## 2022-11-15 MED ORDER — LISINOPRIL 40 MG PO TABS: 40.0000 mg | ORAL_TABLET | Freq: Every day | ORAL | 3 refills | Status: DC

## 2022-11-15 NOTE — ED Triage Notes (Signed)
HTN increasing over the last week Dizzy x2-3 hours Nausea Off balance x2-3hours Complains of HA and blurred vision.   Pt on blood thinners Usually takes lisinopril 10mg  at morning and night. Today took 10mg  in the morning and an additional 30mg  at noon per PCP recommendation .

## 2022-11-15 NOTE — Patient Instructions (Signed)

## 2022-11-15 NOTE — ED Provider Notes (Signed)
[place] EMERGENCY DEPARTMENT AT [facility] Provider Note   CSN: [id number] Arrival date & time: 11/15/22  2009     History  Chief Complaint  Patient presents with   Hypertension    Amy [name] is a 77 y.o. female.  Patient with long-term hypertension.  Patient states they have been struggling to get it under control here of late.  Few weeks ago she had her Imdur elevated.  Was seen by primary care provider today at [facility] primary care and they have increased her lisinopril up to 40 mg in the morning.  Patient's regiment currently is hydrochlorothiazide 12.5 mg at night and Imdur 60 mg at night.  That regiment seems a little strange with both those medications being at night.  Patient also has a pacemaker for sick sinus syndrome.  So patient is atrial paced.  Patient also has a history of coronary artery disease.  Hyperlipidemia hypothyroidism.  Patient states she felt a little nauseated and a little bit dizzy at home.  But that is resolved.  Denies any visual changes any numbness or weakness denies any headache shortness of breath or chest pain.  Patient has a past history of vestibulitis.  And has been treated for that in the past.       Home Medications Prior to Admission medications   Medication Sig Start Date End Date Taking? Authorizing Provider  aspirin 81 MG tablet Take 81 mg by mouth daily.   Yes [provider]  atorvastatin (LIPITOR) 20 MG tablet Take 20 mg by mouth at bedtime.  06/27/17  Yes [provider]  betamethasone valerate ointment (VALISONE) 0.1 % APPLY TO THE AFFECTED AREA(S) TWICE DAILY 10/22/22  Yes Billie Lade, MD  calcium carbonate (OSCAL) 1500 (600 Ca) MG TABS tablet Take 1,500 mg by mouth daily with breakfast.   Yes [provider]  Cholecalciferol (VITAMIN D3) 2000 units capsule Take 2,000 Units by mouth daily at 12 noon.    Yes [provider]  clobetasol (TEMOVATE) 0.05 % external solution Apply 1  application topically daily as needed (psoriasis).    Yes [provider]  clopidogrel (PLAVIX) 75 MG tablet TAKE 1 TABLET BY MOUTH DAILY 03/14/22  Yes Jonelle Sidle, MD  hydrochlorothiazide (HYDRODIURIL) 12.5 MG tablet Take 12.5 mg by mouth daily. 07/17/22  Yes [provider]  isosorbide mononitrate (IMDUR) 60 MG 24 hr tablet Take 1 tablet (60 mg total) by mouth daily. 11/01/22 01/30/23 Yes Billie Lade, MD  levothyroxine (SYNTHROID) 75 MCG tablet Take 75 mcg by mouth every morning. 10/25/20  Yes [provider]  lisinopril (ZESTRIL) 10 MG tablet Take 10 mg by mouth 2 (two) times daily. 11/15/22  Yes [provider]  lisinopril (ZESTRIL) 40 MG tablet Take 1 tablet (40 mg total) by mouth daily. 11/15/22  Yes Del Newman Nip, Tenna Child, FNP  Multiple Vitamin (MULTIVITAMIN WITH MINERALS) TABS tablet Take 1 tablet by mouth daily at 12 noon.    Yes [provider]  omeprazole (PRILOSEC) 40 MG capsule Take 40 mg by mouth daily. 06/14/22  Yes [provider]      Allergies    Sulfa antibiotics    Review of Systems   Review of Systems  Constitutional:  Negative for chills and fever.  HENT:  Negative for ear pain and sore throat.   Eyes:  Negative for pain and visual disturbance.  Respiratory:  Negative for cough and shortness of breath.   Cardiovascular:  Negative for  chest pain and palpitations.  Gastrointestinal:  Positive for nausea. Negative for abdominal pain and vomiting.  Genitourinary:  Negative for dysuria and hematuria.  Musculoskeletal:  Negative for arthralgias and back pain.  Skin:  Negative for color change and rash.  Neurological:  Positive for dizziness. Negative for seizures and syncope.  All other systems reviewed and are negative.   Physical Exam Updated Vital Signs BP (!) 193/67   Pulse 62   Temp 98.6 F (37 C) (Oral)   Resp 17   Ht 1.473 m (4\' 10" )   Wt 65.8 kg   SpO2 97%   BMI 30.31 kg/m  Physical  Exam Vitals and nursing note reviewed.  Constitutional:      General: She is not in acute distress.    Appearance: Normal appearance. She is well-developed.  HENT:     Head: Normocephalic and atraumatic.  Eyes:     Extraocular Movements: Extraocular movements intact.     Conjunctiva/sclera: Conjunctivae normal.     Pupils: Pupils are equal, round, and reactive to light.  Cardiovascular:     Rate and Rhythm: Normal rate and regular rhythm.     Heart sounds: No murmur heard. Pulmonary:     Effort: Pulmonary effort is normal. No respiratory distress.     Breath sounds: Normal breath sounds.  Abdominal:     Palpations: Abdomen is soft.     Tenderness: There is no abdominal tenderness.  Musculoskeletal:        General: No swelling.     Cervical back: Neck supple.  Skin:    General: Skin is warm and dry.     Capillary Refill: Capillary refill takes less than 2 seconds.  Neurological:     General: No focal deficit present.     Mental Status: She is alert and oriented to person, place, and time.     Cranial Nerves: No cranial nerve deficit.     Sensory: No sensory deficit.     Motor: No weakness.  Psychiatric:        Mood and Affect: Mood normal.     ED Results / Procedures / Treatments   Labs (all labs ordered are listed, but only abnormal results are displayed) Labs Reviewed  CBC WITH DIFFERENTIAL/PLATELET - Abnormal; Notable for the following components:      Result Value   Hemoglobin 11.1 (*)    HCT 34.4 (*)    Platelets 146 (*)    All other components within normal limits  COMPREHENSIVE METABOLIC PANEL - Abnormal; Notable for the following components:   Sodium 133 (*)    Potassium 3.3 (*)    Glucose, Bld 155 (*)    Creatinine, Ser 1.02 (*)    Calcium 8.3 (*)    Total Protein 6.0 (*)    GFR, Estimated 56 (*)    All other components within normal limits  MAGNESIUM - Abnormal; Notable for the following components:   Magnesium 1.6 (*)    All other components within  normal limits    EKG EKG Interpretation Date/Time:  Friday November 15 2022 20:25:06 EDT Ventricular Rate:  65 PR Interval:  162 QRS Duration:  74 QT Interval:  384 QTC Calculation: 399 R Axis:   -9  Text Interpretation: Atrial-paced rhythm with Premature atrial complexes Minimal voltage criteria for LVH, may be normal variant ( R in aVL ) Abnormal ECG When compared with ECG of 15-Nov-2022 20:24, Premature ventricular complexes are no longer Present Premature atrial complexes are now Present No significant  change since last tracing Confirmed by Vanetta Mulders [phone]) on 11/15/2022 11:26:55 PM  Radiology No results found.  Procedures Procedures    Medications Ordered in ED Medications - No data to display  ED Course/ Medical Decision Making/ A&P                                 Medical Decision Making Amount and/or Complexity of Data Reviewed Labs: ordered. ECG/medicine tests: ordered.   Patient CBC here white count 5.2 hemoglobin is 11.1 platelets 146.  Patient is on Plavix and aspirin.  Patient is not on a true blood thinner.  Patient's complete metabolic panel potassium a little down at 3.4 GFR is 56 not ALT far off from baseline for her.  Magnesium down a little bit at 1.6.  Her EKG shows an atrial paced rhythm.  Patient really has no neurodeficits here.  No concerns for code stroke.  Patient's blood pressures are elevated here.  Not showing any significant endorgan damage.  Lungs are clear as well oxygen saturation is 100% on room air.  Patient's primary just increased her lisinopril today to 40 mg in the morning recommend she keep a log and see what effect that has.  A little skeptical that she may need a second opinion from cardiology on her blood pressure control so she is going to make an appointment to follow-up with them.  Also noted that she does have a pretty wide pulse pressure.  And that could be part of her pacemaker or always could be concern for maybe some  fluid around the heart.  But certainly patient is in no extremis.  Patient will return for any chest pain headache any shortness of breath or any strokelike symptoms.  Patient does not need any acute intervention her blood pressure here tonight.  But does need to get this blood pressure under control gradually over the next few weeks.  The does seem as if primary care provider is struggling a little bit to get it under control.  Also not clear why she takes some of her blood pressure medicine at night.   Final Clinical Impression(s) / ED Diagnoses Final diagnoses:  Resistant hypertension    Rx / DC Orders ED Discharge Orders     None         Vanetta Mulders, MD 11/15/22 2335

## 2022-11-15 NOTE — Progress Notes (Signed)
Patient Office Visit   Subjective   Patient ID: Amy [name], female    DOB: 31-Mar-1945  Age: 77 y.o. MRN: [id number]  CC:  Chief Complaint  Patient presents with   Hypertension    Patient complains of continued HTN.     HPI Amy [name] 77 year old female,  presents to the clinic for HTN management  and diarrhea. She  has a past medical history of Asthma, CAD (coronary artery disease), Essential hypertension, Hyperlipidemia, Hypothyroidism, and Sinus node dysfunction (HCC). For the details of today's visit, please refer to assessment and plan.   The patient reports a chronic issue with diarrhea, occurring less than twice a day, but gradually worsening over time. The stool is described as watery with mucous, and the diarrhea sometimes wakes her from sleep. She denies experiencing abdominal pain, chills, fever, or headaches. A potential risk factor includes suspected food intake. The patient has attempted dietary changes with no significant improvement. Her medical history includes diverticulosis.     Outpatient Encounter Medications as of 11/15/2022  Medication Sig   acetaminophen (TYLENOL) 650 MG CR tablet Take 650 mg by mouth daily as needed for pain.   aspirin 81 MG tablet Take 81 mg by mouth daily.   atorvastatin (LIPITOR) 20 MG tablet Take 20 mg by mouth at bedtime.    betamethasone valerate ointment (VALISONE) 0.1 % APPLY TO THE AFFECTED AREA(S) TWICE DAILY   calcium carbonate (OSCAL) 1500 (600 Ca) MG TABS tablet Take by mouth 2 (two) times daily with a meal.   Cholecalciferol (VITAMIN D3) 2000 units capsule Take 2,000 Units by mouth daily at 12 noon.    clobetasol (TEMOVATE) 0.05 % external solution Apply 1 application topically daily as needed (psoriasis).    clopidogrel (PLAVIX) 75 MG tablet TAKE 1 TABLET BY MOUTH DAILY   hydrochlorothiazide (HYDRODIURIL) 12.5 MG tablet Take 12.5 mg by mouth daily.   ipratropium (ATROVENT) 0.03 % nasal spray Place 2 sprays into both  nostrils 3 (three) times daily.   isosorbide mononitrate (IMDUR) 60 MG 24 hr tablet Take 1 tablet (60 mg total) by mouth daily.   levothyroxine (SYNTHROID) 75 MCG tablet Take 75 mcg by mouth every morning.   lisinopril (ZESTRIL) 40 MG tablet Take 1 tablet (40 mg total) by mouth daily.   Multiple Vitamin (MULTIVITAMIN WITH MINERALS) TABS tablet Take 1 tablet by mouth daily at 12 noon.    omeprazole (PRILOSEC) 40 MG capsule Take 40 mg by mouth daily.   triamcinolone cream (KENALOG) 0.1 % Apply 1 application topically daily as needed (psoriasis). Mixed with nystatin cream 1:1   [DISCONTINUED] lisinopril (ZESTRIL) 10 MG tablet TAKE 1 TABLET BY MOUTH TWICE DAILY   No facility-administered encounter medications on file as of 11/15/2022.    Past Surgical History:  Procedure Laterality Date   ABDOMINAL HYSTERECTOMY     ANAL FISSURE REPAIR     CATARACT EXTRACTION W/PHACO Right 10/17/2014   Procedure: CATARACT EXTRACTION PHACO AND INTRAOCULAR LENS PLACEMENT (IOC);  Surgeon: Gemma Payor, MD;  Location: [facility];  Service: Ophthalmology;  Laterality: Right;  CDE: 4.88   CATARACT EXTRACTION W/PHACO Left 10/27/2014   Procedure: CATARACT EXTRACTION PHACO AND INTRAOCULAR LENS PLACEMENT (IOC);  Surgeon: Gemma Payor, MD;  Location: [facility];  Service: Ophthalmology;  Laterality: Left;  CDE:7.86   COLONOSCOPY N/A 04/16/2018   Procedure: COLONOSCOPY;  Surgeon: Malissa Hippo, MD;  Location: [facility] SU[facility];  Service: Endoscopy;  Laterality: N/A;  830   FINGER SURGERY  NASAL SINUS SURGERY     PACEMAKER IMPLANT N/A 07/30/2016   Procedure: Pacemaker Implant Dual Chamber;  Surgeon: Marinus Maw, MD;  Location[facility] INVASIVE CV LAB;  Service: Cardiovascular;  Laterality: N/A;   TONSILLECTOMY      Review of Systems  Constitutional:  Negative for chills and fever.  Eyes:  Negative for blurred vision.  Respiratory:  Negative for shortness of breath.   Cardiovascular:  Negative for chest pain.  Gastrointestinal:   Positive for diarrhea. Negative for blood in stool and constipation.  Neurological:  Negative for dizziness and headaches.      Objective    BP (!) 172/68   Pulse 73   Ht 4\' 11"  (1.499 m)   Wt 145 lb (65.8 kg)   SpO2 98%   BMI 29.29 kg/m   Physical Exam Vitals reviewed.  Constitutional:      General: She is not in acute distress.    Appearance: Normal appearance. She is not ill-appearing, toxic-appearing or diaphoretic.  HENT:     Head: Normocephalic.  Eyes:     General:        Right eye: No discharge.        Left eye: No discharge.     Conjunctiva/sclera: Conjunctivae normal.  Cardiovascular:     Pulses: Normal pulses.     Heart sounds: Normal heart sounds.  Pulmonary:     Effort: Pulmonary effort is normal. No respiratory distress.     Breath sounds: Normal breath sounds.  Abdominal:     General: Bowel sounds are normal.     Palpations: Abdomen is soft.     Tenderness: There is no abdominal tenderness. There is no right CVA tenderness, left CVA tenderness or guarding.  Musculoskeletal:        General: Normal range of motion.     Cervical back: Normal range of motion.  Skin:    General: Skin is warm and dry.     Capillary Refill: Capillary refill takes less than 2 seconds.  Neurological:     General: No focal deficit present.     Mental Status: She is alert and oriented to person, place, and time.     Coordination: Coordination normal.     Gait: Gait normal.  Psychiatric:        Mood and Affect: Mood normal.        Behavior: Behavior normal.       Assessment & Plan:  Diarrhea, unspecified type Assessment & Plan: Advise OTC loperamide and bismuth subsalicylate for diarrhea management Explained patient increase fluid intake 8 glasses of water a day to prevent dehydration. Avoid caffeine and alcohol. Add semisolid and low-fiber foods gradually as bowel movements return to normal. Follow a BRAT diet bananas, rice, applesauce, toast helping firm up stool.  Follow-up in unable to keep food/fluid down x 24 hours, dizziness, fevers, worsening or persistent symptoms to present to ED or contact primary care provider. Patient verbalizes understanding regarding plan of care and all questions answered.    Orders: -     GI Profile, Stool, PCR  Primary hypertension -     BMP8+eGFR -     Lisinopril; Take 1 tablet (40 mg total) by mouth daily.  Dispense: 90 tablet; Refill: 3  Essential hypertension Assessment & Plan: Vitals:   11/15/22 0949 11/15/22 0950  BP: (!) 201/63 (!) 172/68  patient's blood pressure is not well-controlled today. She reports that she missed taking her blood pressure medication due to a lack of time.  I emphasized the importance of medication adherence, especially for blood pressure management, as non-compliance increases the risk of stroke.  Current B/P medication regimen Hydrochlorothiazide 12.5 mg daily, Lisinopril 10 mg twice daily, and Imdur 60 mg daily. Her recent home blood pressure readings are: 170/70, 178/69, 160/70, and 147/64.  During today's visit, Lisinopril was increased to 40 mg. Labs were ordered, and a follow-up appointment is scheduled in 4 weeks. Labs ordered. Discussed with  patient to monitor their blood pressure regularly and maintain a heart-healthy diet rich in fruits, vegetables, whole grains, and low-fat dairy, while reducing sodium intake to less than 2,300 mg per day. Regular physical activity, such as 30 minutes of moderate exercise most days of the week, will help lower blood pressure and improve overall cardiovascular health. Avoiding smoking, limiting alcohol consumption, and managing stress. Take  prescribed medication, & take it as directed and avoid skipping doses. Seek emergency care if your blood pressure is (over 180/120) or you experience chest pain, shortness of breath, or sudden vision changes.Patient verbalizes understanding regarding plan of care and all questions answered. Follow up in   weeks  with at home blood pressure to monitor trends.        Return in about 4 weeks (around 12/13/2022), or With Dr.Dixon, for re-check blood pressure, hypertension.   Cruzita Lederer Newman Nip, FNP

## 2022-11-15 NOTE — Discharge Instructions (Addendum)
Recommend you make an appointment to see cardiology for second opinion on the blood pressure.  Keep a daily log see what effect the increase in lisinopril has on your blood pressure.  Return for headache chest pain trouble breathing or any strokelike symptoms.  Today's labs without any acute findings.

## 2022-11-15 NOTE — Assessment & Plan Note (Addendum)
Vitals:   11/15/22 0949 11/15/22 0950  BP: (!) 201/63 (!) 172/68  patient's blood pressure is not well-controlled today. She reports that she missed taking her blood pressure medication due to a lack of time. I emphasized the importance of medication adherence, especially for blood pressure management, as non-compliance increases the risk of stroke.  Current B/P medication regimen Hydrochlorothiazide 12.5 mg daily, Lisinopril 10 mg twice daily, and Imdur 60 mg daily. Her recent home blood pressure readings are: 170/70, 178/69, 160/70, and 147/64.  During today's visit, Lisinopril was increased to 40 mg. Labs were ordered, and a follow-up appointment is scheduled in 4 weeks. Labs ordered. Discussed with  patient to monitor their blood pressure regularly and maintain a heart-healthy diet rich in fruits, vegetables, whole grains, and low-fat dairy, while reducing sodium intake to less than 2,300 mg per day. Regular physical activity, such as 30 minutes of moderate exercise most days of the week, will help lower blood pressure and improve overall cardiovascular health. Avoiding smoking, limiting alcohol consumption, and managing stress. Take  prescribed medication, & take it as directed and avoid skipping doses. Seek emergency care if your blood pressure is (over 180/120) or you experience chest pain, shortness of breath, or sudden vision changes.Patient verbalizes understanding regarding plan of care and all questions answered. Follow up in   weeks with at home blood pressure to monitor trends.

## 2022-11-15 NOTE — Assessment & Plan Note (Signed)
Advise OTC loperamide and bismuth subsalicylate for diarrhea management Explained patient increase fluid intake 8 glasses of water a day to prevent dehydration. Avoid caffeine and alcohol. Add semisolid and low-fiber foods gradually as bowel movements return to normal. Follow a BRAT diet bananas, rice, applesauce, toast helping firm up stool. Follow-up in unable to keep food/fluid down x 24 hours, dizziness, fevers, worsening or persistent symptoms to present to ED or contact primary care provider. Patient verbalizes understanding regarding plan of care and all questions answered.

## 2022-11-15 NOTE — ED Notes (Addendum)
Pt care taken, waiting for md to come see her. Alert and oriented x4. No complaints at this time.

## 2022-11-16 LAB — BMP8+EGFR
BUN/Creatinine Ratio: 17 (ref 12–28)
BUN: 15 mg/dL (ref 8–27)
CO2: 23 mmol/L (ref 20–29)
Calcium: 8.9 mg/dL (ref 8.7–10.3)
Chloride: 101 mmol/L (ref 96–106)
Creatinine, Ser: 0.88 mg/dL (ref 0.57–1.00)
Glucose: 94 mg/dL (ref 70–99)
Potassium: 3.8 mmol/L (ref 3.5–5.2)
Sodium: 139 mmol/L (ref 134–144)
eGFR: 67 mL/min/{1.73_m2} (ref >59)

## 2022-11-18 ENCOUNTER — Telehealth: Payer: Self-pay | Admitting: Cardiology

## 2022-11-18 ENCOUNTER — Ambulatory Visit: Payer: Medicare Other | Attending: Cardiology | Admitting: Cardiology

## 2022-11-18 ENCOUNTER — Encounter: Payer: Self-pay | Admitting: Cardiology

## 2022-11-18 VITALS — BP 168/60 | HR 88 | Ht 59.0 in | Wt 144.6 lb

## 2022-11-18 DIAGNOSIS — I10 Essential (primary) hypertension: Secondary | ICD-10-CM | POA: Diagnosis not present

## 2022-11-18 DIAGNOSIS — E785 Hyperlipidemia, unspecified: Secondary | ICD-10-CM | POA: Insufficient documentation

## 2022-11-18 DIAGNOSIS — I25119 Atherosclerotic heart disease of native coronary artery with unspecified angina pectoris: Secondary | ICD-10-CM | POA: Insufficient documentation

## 2022-11-18 DIAGNOSIS — I5032 Chronic diastolic (congestive) heart failure: Secondary | ICD-10-CM | POA: Diagnosis not present

## 2022-11-18 DIAGNOSIS — R197 Diarrhea, unspecified: Secondary | ICD-10-CM | POA: Diagnosis not present

## 2022-11-18 MED ORDER — AMLODIPINE BESYLATE 5 MG PO TABS: 5.0000 mg | ORAL_TABLET | Freq: Every evening | ORAL | 3 refills | Status: DC

## 2022-11-18 MED ORDER — VALSARTAN 160 MG PO TABS: 160.0000 mg | ORAL_TABLET | Freq: Every morning | ORAL | 3 refills | Status: DC

## 2022-11-18 NOTE — Telephone Encounter (Signed)
Did not need this encounter

## 2022-11-18 NOTE — Patient Instructions (Signed)
Medication Instructions:   DISCONTINUE Lisinopril  START Valsartan one (1) tablet by mouth ( 160 mg) in the morning.  START Amlodipine one (1) tablet by mouth ( 5 mg) in the evening.   *If you need a refill on your cardiac medications before your next appointment, please call your pharmacy*   Lab Work:  Your physician recommends that you return for lab work in one week with new start of medication at [facility]. Paper work given to patient today.    If you have labs (blood work) drawn today and your tests are completely normal, you will receive your results only by: MyChart Message (if you have MyChart) OR A paper copy in the mail If you have any lab test that is abnormal or we need to change your treatment, we will call you to review the results.   Testing/Procedures: IN EDEN  Your physician has requested that you have a renal artery duplex. During this test, an ultrasound is used to evaluate blood flow to the kidneys. Allow one hour for this exam. Do not eat after midnight the day before and avoid carbonated beverages. Take your medications as you usually do.   Follow-Up: At [facility], you and your health needs are our priority.  As part of our continuing mission to provide you with exceptional heart care, we have created designated Provider Care Teams.  These Care Teams include your primary Cardiologist (physician) and Advanced Practice Providers (APPs -  Physician Assistants and Nurse Practitioners) who all work together to provide you with the care you need, when you need it.  We recommend signing up for the patient portal called "MyChart".  Sign up information is provided on this After Visit Summary.  MyChart is used to connect with patients for Virtual Visits (Telemedicine).  Patients are able to view lab/test results, encounter notes, upcoming appointments, etc.  Non-urgent messages can be sent to your provider as well.   To learn more about what you can do with  MyChart, go to [email or web address].    Your next appointment:   1 month(s)  Provider:   Sharlene Dory, NP      Other Instructions  You have been referred to the HTN clinic at [facility] with Dr. Chilton Si.

## 2022-11-18 NOTE — Progress Notes (Signed)
Cardiology Office Note:   Date:  11/18/2022  ID:  Amy [name], DOB 10-Nov-1945, MRN [id number] PCP: Billie Lade, MD  [facility] Providers Cardiologist:  Nona Dell, MD Electrophysiologist:  Lewayne Bunting, MD    History of Present Illness:   Discussed the use of AI scribe software for clinical note transcription with the patient, who gave verbal consent to proceed.  History of Present Illness   The patient, with a history of hypertension, CAD s/p PCI, HFpEF, mild-moderate aortic stenosis, splenomegaly presents with concerns of persistently elevated blood pressure over the past few weeks. The patient reports systolic readings frequently exceeding 200, despite an increase in antihypertensive medications, including isosorbide mononitrate and lisinopril. The patient's blood pressure readings have been consistently high, both in the morning and at night, with occasional symptoms of blurry vision and nausea, particularly at night.  In addition to hypertension, the patient reports chronic indigestion, described as a discomfort in the upper abdomen. Despite daily use of omeprazole for the past six to seven months, the patient continues to experience this discomfort, often accompanied by a mild feeling of nausea.  The patient also reports history of occasional episodes of vomiting and diarrhea, occurring predominantly at night. These episodes had ceased for a period but recently recurred with an episode last week. She saw her PCP and is now taking loperamide and bismuth subsalicylate.  In ROS summary, patient denies chest pain, shortness of breath, lower extremity edema, fatigue, palpitations, melena, hematuria, hemoptysis, diaphoresis, weakness, presyncope, syncope, orthopnea, and PND. She reports epigastric discomfort that is attributed to GERD and occasional blurry vision and nausea with hypertension.   Studies Reviewed:    EKG:   Recent 11/15/22 ED ECG review with atrial pacing  and PVCs. No acute ischemic changes.      08/20/22 TTE  IMPRESSIONS     1. Left ventricular ejection fraction, by estimation, is >75%. The left  ventricle has hyperdynamic function. The left ventricle has no regional  wall motion abnormalities. There is mild left ventricular hypertrophy.  Left ventricular diastolic parameters  are consistent with Grade I diastolic dysfunction (impaired relaxation).   2. Right ventricular systolic function is normal. The right ventricular  size is normal. There is normal pulmonary artery systolic pressure.   3. Left atrial size was mildly dilated.   4. The mitral valve is abnormal. Trivial mitral valve regurgitation.   5. AV is thickened, calcified with minimally restricted motion. Peak and  mean gradients through the valve are 19 and 10 mm Hg respectively  consistent with very mild AS.Marland Kitchen The aortic valve is tricuspid. Aortic valve  regurgitation is not visualized.   Comparison(s): The left ventricular function is unchanged.   FINDINGS   Left Ventricle: Left ventricular ejection fraction, by estimation, is  >75%. The left ventricle has hyperdynamic function. The left ventricle has  no regional wall motion abnormalities. The left ventricular internal  cavity size was normal in size. There  is mild left ventricular hypertrophy. Left ventricular diastolic  parameters are consistent with Grade I diastolic dysfunction (impaired  relaxation).   Right Ventricle: The right ventricular size is normal. Right vetricular  wall thickness was not assessed. Right ventricular systolic function is  normal. There is normal pulmonary artery systolic pressure. The tricuspid  regurgitant velocity is 2.76 m/s,  and with an assumed right atrial pressure of 3 mmHg, the estimated right  ventricular systolic pressure is 33.5 mmHg.   Left Atrium: Left atrial size was mildly dilated.  Right Atrium: Right atrial size was normal in size.   Pericardium: There is no  evidence of pericardial effusion.   Mitral Valve: The mitral valve is abnormal. There is mild thickening of  the mitral valve leaflet(s). Mild mitral annular calcification. Trivial  mitral valve regurgitation.   Tricuspid Valve: The tricuspid valve is normal in structure. Tricuspid  valve regurgitation is mild.   Aortic Valve: AV is thickened, calcified with minimally restricted motion.  Peak and mean gradients through the valve are 19 and 10 mm Hg respectively  consistent with very mild AS. The aortic valve is tricuspid. Aortic valve  regurgitation is not  visualized. Aortic valve mean gradient measures 10.0 mmHg. Aortic valve  peak gradient measures 18.8 mmHg. Aortic valve area, by VTI measures 1.12  cm.   Pulmonic Valve: The pulmonic valve was normal in structure. Pulmonic valve  regurgitation is not visualized.   Aorta: The aortic root is normal in size and structure.   IAS/Shunts: No atrial level shunt detected by color flow Doppler.   Additional Comments: A pacer wire is visualized.     Risk Assessment/Calculations:     HYPERTENSION CONTROL Vitals:   11/18/22 1116 11/18/22 1135  BP: (!) 161/48 (!) 168/60    The patient's blood pressure is elevated above target today.  In order to address the patient's elevated BP: A current anti-hypertensive medication was adjusted today.; A referral to the Advanced Hypertension Clinic will be placed.; Follow up with general cardiology has been recommended.; Blood pressure will be monitored at home to determine if medication changes need to be made.           Physical Exam:   VS:  BP (!) 168/60   Pulse 88   Ht 4\' 11"  (1.499 m)   Wt 144 lb 9.6 oz (65.6 kg)   SpO2 98%   BMI 29.21 kg/m    Wt Readings from Last 3 Encounters:  11/18/22 144 lb 9.6 oz (65.6 kg)  11/15/22 145 lb (65.8 kg)  11/15/22 145 lb (65.8 kg)     Physical Exam Vitals reviewed.  Constitutional:      Appearance: Normal appearance.  HENT:     Head:  Normocephalic.     Nose: Nose normal.  Eyes:     Pupils: Pupils are equal, round, and reactive to light.  Cardiovascular:     Rate and Rhythm: Normal rate and regular rhythm.     Pulses: Normal pulses.     Heart sounds: Normal heart sounds. No murmur heard.    No friction rub. No gallop.  Pulmonary:     Effort: Pulmonary effort is normal.     Breath sounds: Normal breath sounds.  Abdominal:     General: Abdomen is flat.  Musculoskeletal:     Right lower leg: No edema.     Left lower leg: No edema.  Skin:    General: Skin is warm and dry.     Capillary Refill: Capillary refill takes less than 2 seconds.  Neurological:     General: No focal deficit present.     Mental Status: She is alert and oriented to person, place, and time.  Psychiatric:        Mood and Affect: Mood normal.        Behavior: Behavior normal.        Thought Content: Thought content normal.        Judgment: Judgment normal.     ASSESSMENT AND PLAN:     Assessment and  Plan    Resistant Hypertension Persistent high systolic blood pressure despite being on multiple antihypertensive medications (Lisinopril 40mg , Isosorbide Mononitrate 60mg , Hydrochlorothiazide 12.5mg ). Patient reported symptoms of blurry vision and nausea associated with high blood pressure readings, though no recurrence since last visit to the ED. -Discontinue Lisinopril and start Valsartan 160mg . -Add Amlodipine 5mg  at night. Patient previously took this and recalls tolerating well.  -Check renal function and potassium levels in one week. -Order renal artery duplex to investigate potential renal artery stenosis. -Refer to hypertension clinic for further management.  CAD status post DES x 2 to the LAD/diagonal bifurcation in 2015  -Continue ASA, Plavix, Lipitor, Imdur -No symptoms consistent with angina reported. Epigastric discomfort is most consistent with GERD but may need to consider repeat stress testing if medication management of  GERD does not improve symptoms.  HFpEF Prior echocardiogram in 2021 with hyperdynamic LV function and grade I DD. Patient without symptoms of CHF but will need better controlled BP to prevent progression.   Gastroesophageal Reflux Disease (GERD) Patient chronically on PPI, specifically Omeprazole.  -Per chart review upon writing note, this has been used concurrently with Plavix for quite some time. Given potentially for Omeprazole to lessen efficacy of Plavix, needs to switch to Protonix. Due to time spent discussing significant hypertension, was unable to address during the visit. Will follow up via staff phone call with recommendation.  -Recommend ongoing follow up with PCP  Hyperlipidemia Not specifically addressed at today's visit with acute concern of hypertension. Last LDL 59 as of 05/14/21. Goal LDL <55. Will need repeat lipid panel at follow up.   1 month follow up with gen cards. Referral placed to hypertension clinic.               Signed, Perlie Gold, PA-C

## 2022-11-18 NOTE — Telephone Encounter (Signed)
Error

## 2022-11-19 ENCOUNTER — Telehealth: Payer: Self-pay | Admitting: Cardiology

## 2022-11-19 MED ORDER — PANTOPRAZOLE SODIUM 40 MG PO TBEC: 40.0000 mg | DELAYED_RELEASE_TABLET | Freq: Every day | ORAL | 3 refills | Status: DC

## 2022-11-19 NOTE — Telephone Encounter (Signed)
Spoke with patient, agreeable to change omeprazole to pantoprazole. Instructed patient a prescription would be sent in once we confirmed dosage. No questions, patient thankful for the call.  Perlie Gold, PA-C  [facility] [address] [place] hour ago (2:44 PM)   Please inform patient that Dr. Diona Browner is okay with switch from Omeprazole to Protonix due to potential interaction.

## 2022-11-19 NOTE — Telephone Encounter (Signed)
-----   Message from Nona Dell sent at 11/19/2022  8:24 AM EDT ----- Regarding: RE: Plavix/PPI Thank you Clayburn Pert.  She had a complex bifurcational PCI through [facility] back in 2015 and was told to stay on dual antiplatelet therapy indefinitely.  She has tolerated aspirin and Plavix well, certainly reasonable to change Prilosec to Protonix to tidy things up, but not sure that she has had any issues with the prior combination in her case. ----- Message ----- From: Perlie Gold, PA-C Sent: 11/19/2022   8:01 AM EDT To: Jonelle Sidle, MD Subject: Plavix/PPI                                     Good morning,  I saw Ms. Magowan yesterday as a same day add on for hypertension. After the visit, I was reviewing medications and noticed that she's on both Plavix and Omeprazole, appears to have been taking both for quite some time. Before recommending switch to Protonix, I wanted to confirm that there are plans to continue Plavix/ASA indefinitely?  Regards,  Perlie Gold, PA-C

## 2022-11-19 NOTE — Telephone Encounter (Signed)
Spoke with patient, pantoprazole 40 mg once daily sent in to patients pharmacy - no further needs at this time    Perlie Gold, PA-C  You; [facility] [address] Triage8 minutes ago (4:04 PM)   40mg  once daily.  Perlie Gold, PA-C    You  Perlie Gold, PA-C10 minutes ago (4:02 PM)   Protonix dosage? 20 mg or 40 mg once daily?

## 2022-11-20 ENCOUNTER — Telehealth: Payer: Self-pay | Admitting: Internal Medicine

## 2022-11-20 ENCOUNTER — Ambulatory Visit: Payer: Medicare Other | Admitting: Cardiology

## 2022-11-20 LAB — GI PROFILE, STOOL, PCR

## 2022-11-20 NOTE — Telephone Encounter (Signed)
Patient called needs speak nurse blood pressure high 188/81 very congestion. Sent 3 teams out to speak to nurse. 10.16.2024 at 9:24 , 9:29 and 9:38 am

## 2022-11-27 DIAGNOSIS — I10 Essential (primary) hypertension: Secondary | ICD-10-CM | POA: Diagnosis not present

## 2022-11-27 DIAGNOSIS — I25119 Atherosclerotic heart disease of native coronary artery with unspecified angina pectoris: Secondary | ICD-10-CM | POA: Diagnosis not present

## 2022-11-27 DIAGNOSIS — E785 Hyperlipidemia, unspecified: Secondary | ICD-10-CM | POA: Diagnosis not present

## 2022-11-28 ENCOUNTER — Ambulatory Visit (HOSPITAL_COMMUNITY)
Admission: RE | Admit: 2022-11-28 | Discharge: 2022-11-28 | Disposition: A | Discharge status: Home / Self Care | Payer: Medicare Other | Source: Ambulatory Visit | Attending: Cardiology | Admitting: Cardiology

## 2022-11-28 DIAGNOSIS — I10 Essential (primary) hypertension: Secondary | ICD-10-CM | POA: Insufficient documentation

## 2022-11-28 DIAGNOSIS — E785 Hyperlipidemia, unspecified: Secondary | ICD-10-CM | POA: Insufficient documentation

## 2022-11-28 DIAGNOSIS — I25119 Atherosclerotic heart disease of native coronary artery with unspecified angina pectoris: Secondary | ICD-10-CM | POA: Diagnosis not present

## 2022-11-28 LAB — BASIC METABOLIC PANEL
BUN/Creatinine Ratio: 16 (ref 12–28)
BUN: 18 mg/dL (ref 8–27)
CO2: 25 mmol/L (ref 20–29)
Calcium: 9.5 mg/dL (ref 8.7–10.3)
Chloride: 100 mmol/L (ref 96–106)
Creatinine, Ser: 1.11 mg/dL — ABNORMAL HIGH (ref 0.57–1.00)
Glucose: 93 mg/dL (ref 70–99)
Potassium: 4.2 mmol/L (ref 3.5–5.2)
Sodium: 139 mmol/L (ref 134–144)
eGFR: 51 mL/min/{1.73_m2} — ABNORMAL LOW (ref >59)

## 2022-11-29 ENCOUNTER — Telehealth: Payer: Self-pay

## 2022-11-29 DIAGNOSIS — I10 Essential (primary) hypertension: Secondary | ICD-10-CM

## 2022-11-29 DIAGNOSIS — I701 Atherosclerosis of renal artery: Secondary | ICD-10-CM

## 2022-11-29 NOTE — Telephone Encounter (Signed)
The patient has been notified of the result and verbalized understanding.  All questions (if any) were answered. Amy Burows, RN 11/29/2022 3:17 PM   Advised pt one of our schedulers will call to set up OV.

## 2022-11-29 NOTE — Telephone Encounter (Signed)
-----   Message from Amy [name] sent at 11/29/2022  3:01 PM EDT ----- Patient's ultrasound study does show both left and right renal artery stenosis. This could explain why her blood pressure has been so resistant to treatment. I've reviewed results with Dr. Diona Browner who recommends referral to Dr. Allyson Sabal for peripheral vascular consultation.

## 2022-12-05 ENCOUNTER — Telehealth: Payer: Self-pay | Admitting: Internal Medicine

## 2022-12-05 NOTE — Telephone Encounter (Signed)
Son in law Clide Cliff called about patient has a large spleen it is growing now and left side of abdomen has a lump bulging out has an appointment with cardiologist 11/12. Has appointment with Dr Durwin Nora on Friday 11/8. Asking will she be okay to wail til 11/8 to see Dr Durwin Nora or what does patient need to do. Call back # [phone]. Not on DPR but daughter April is on DPR her phone # [phone], April at work may not answer the phone.

## 2022-12-06 ENCOUNTER — Emergency Department (HOSPITAL_COMMUNITY): Payer: Medicare Other

## 2022-12-06 ENCOUNTER — Other Ambulatory Visit: Payer: Self-pay

## 2022-12-06 ENCOUNTER — Encounter (HOSPITAL_COMMUNITY): Payer: Self-pay

## 2022-12-06 ENCOUNTER — Emergency Department (HOSPITAL_COMMUNITY)
Admission: EM | Admit: 2022-12-06 | Discharge: 2022-12-06 | Disposition: A | Discharge status: Home / Self Care | Payer: Medicare Other | Attending: Emergency Medicine | Admitting: Emergency Medicine

## 2022-12-06 DIAGNOSIS — Z7189 Other specified counseling: Secondary | ICD-10-CM

## 2022-12-06 DIAGNOSIS — R519 Headache, unspecified: Secondary | ICD-10-CM | POA: Diagnosis not present

## 2022-12-06 DIAGNOSIS — U071 COVID-19: Secondary | ICD-10-CM | POA: Insufficient documentation

## 2022-12-06 DIAGNOSIS — I10 Essential (primary) hypertension: Secondary | ICD-10-CM | POA: Diagnosis not present

## 2022-12-06 DIAGNOSIS — I1A0 Resistant hypertension: Secondary | ICD-10-CM | POA: Diagnosis not present

## 2022-12-06 DIAGNOSIS — Z79899 Other long term (current) drug therapy: Secondary | ICD-10-CM | POA: Insufficient documentation

## 2022-12-06 DIAGNOSIS — Z7902 Long term (current) use of antithrombotics/antiplatelets: Secondary | ICD-10-CM | POA: Insufficient documentation

## 2022-12-06 DIAGNOSIS — R531 Weakness: Secondary | ICD-10-CM | POA: Diagnosis present

## 2022-12-06 DIAGNOSIS — Z7982 Long term (current) use of aspirin: Secondary | ICD-10-CM | POA: Insufficient documentation

## 2022-12-06 DIAGNOSIS — I2510 Atherosclerotic heart disease of native coronary artery without angina pectoris: Secondary | ICD-10-CM | POA: Diagnosis not present

## 2022-12-06 DIAGNOSIS — I5032 Chronic diastolic (congestive) heart failure: Secondary | ICD-10-CM | POA: Diagnosis not present

## 2022-12-06 DIAGNOSIS — Z950 Presence of cardiac pacemaker: Secondary | ICD-10-CM | POA: Diagnosis not present

## 2022-12-06 LAB — COMPREHENSIVE METABOLIC PANEL
ALT: 18 U/L (ref 0–44)
AST: 23 U/L (ref 15–41)
Albumin: 3.6 g/dL (ref 3.5–5.0)
Alkaline Phosphatase: 86 U/L (ref 38–126)
Anion gap: 7 (ref 5–15)
BUN: 16 mg/dL (ref 8–23)
CO2: 25 mmol/L (ref 22–32)
Calcium: 9 mg/dL (ref 8.9–10.3)
Chloride: 99 mmol/L (ref 98–111)
Creatinine, Ser: 1 mg/dL (ref 0.44–1.00)
GFR, Estimated: 58 mL/min — ABNORMAL LOW (ref >60)
Glucose, Bld: 100 mg/dL — ABNORMAL HIGH (ref 70–99)
Potassium: 4.2 mmol/L (ref 3.5–5.1)
Sodium: 131 mmol/L — ABNORMAL LOW (ref 135–145)
Total Bilirubin: 1.9 mg/dL — ABNORMAL HIGH (ref 0.3–1.2)
Total Protein: 6.2 g/dL — ABNORMAL LOW (ref 6.5–8.1)

## 2022-12-06 LAB — RESP PANEL BY RT-PCR (RSV, FLU A&B, COVID)  RVPGX2
Influenza A by PCR: NEGATIVE
Influenza B by PCR: NEGATIVE
Resp Syncytial Virus by PCR: NEGATIVE
SARS Coronavirus 2 by RT PCR: POSITIVE — AB

## 2022-12-06 LAB — URINALYSIS, ROUTINE W REFLEX MICROSCOPIC
Bilirubin Urine: NEGATIVE
Glucose, UA: NEGATIVE mg/dL
Hgb urine dipstick: NEGATIVE
Ketones, ur: NEGATIVE mg/dL
Leukocytes,Ua: NEGATIVE
Nitrite: NEGATIVE
Protein, ur: NEGATIVE mg/dL
Specific Gravity, Urine: 1.015 (ref 1.005–1.030)
pH: 6 (ref 5.0–8.0)

## 2022-12-06 LAB — CBC
HCT: 34.7 % — ABNORMAL LOW (ref 36.0–46.0)
Hemoglobin: 11.5 g/dL — ABNORMAL LOW (ref 12.0–15.0)
MCH: 28.4 pg (ref 26.0–34.0)
MCHC: 33.1 g/dL (ref 30.0–36.0)
MCV: 85.7 fL (ref 80.0–100.0)
Platelets: 134 10*3/uL — ABNORMAL LOW (ref 150–400)
RBC: 4.05 MIL/uL (ref 3.87–5.11)
RDW: 13.9 % (ref 11.5–15.5)
WBC: 5.3 10*3/uL (ref 4.0–10.5)
nRBC: 0 % (ref 0.0–0.2)

## 2022-12-06 LAB — TROPONIN I (HIGH SENSITIVITY): Troponin I (High Sensitivity): 9 ng/L (ref <18)

## 2022-12-06 MED ORDER — ACETAMINOPHEN 500 MG PO TABS
1000.0000 mg | ORAL_TABLET | Freq: Once | ORAL | Status: AC
  Administered 2022-12-06: 1000 mg via ORAL
  Filled 2022-12-06: qty 2

## 2022-12-06 MED ORDER — ENTRESTO 97-103 MG PO TABS: 1.0000 | ORAL_TABLET | Freq: Two times a day (BID) | ORAL | 1 refills | Status: DC

## 2022-12-06 MED ORDER — SACUBITRIL-VALSARTAN 97-103 MG PO TABS
1.0000 | ORAL_TABLET | Freq: Two times a day (BID) | ORAL | Status: DC
  Filled 2022-12-06: qty 1

## 2022-12-06 NOTE — ED Provider Notes (Signed)
  Physical Exam  BP (!) 192/64 (BP Location: Left Arm)   Pulse 77   Temp 98.5 F (36.9 C) (Oral)   Resp 20   Ht 4\' 11"  (1.499 m)   Wt 64.4 kg   SpO2 99%   BMI 28.68 kg/m   Physical Exam  Procedures  Procedures  ED Course / MDM   Clinical Course as of 12/06/22 1724  Fri Dec 06, 2022  1523 HTN refractory to meds, new diagnosis of renal arteyr stenosis; cardiology to see; f/u recommendations for admission vs outpatient management.  [GD]    Clinical Course User Index [GD] Karmen Stabs, MD   Medical Decision Making Amount and/or Complexity of Data Reviewed Labs: ordered. Radiology: ordered.  Risk OTC drugs. Prescription drug management.   77 year old F here for refractory HTN systolic in 200s, SOB and weakness at home. On 2-3 BP meds outpatient and being adjusted.  Newly discovered RAS 60% bilaterally on [place] last week. HTN emergency workup here unremarkable. Found to be covid positive; suspect HA nasal congestion 2/2 this. Neg CT head w/o contrast. Cardiology will see the patient for HTN refractory to several medications. Plan at signout is to f/u recommendations of cardiologist.   Cardiology evaluated the patient and recommends outpatient management.  They have recommended changing her valsartan to Entresto 97 mg - 103 mg twice a day.  They have recommended she continue her Imdur 60 mg and amlodipine 5 mg and follow-up with her PCP.  They have recommended that if she needs additional blood pressure control, outpatient team can consider adding Coreg to her regimen.  Cardiology suspects her symptoms of generalized weakness and shortness of breath are more likely related to her new COVID diagnosis then to her blood pressure.   Reassessed the patient.  At this time her blood pressure is 157/59.  She continues to be on room air.  I discussed cardiology's recommendations with her as well as plan for outpatient management and medication changes as described above.  Sherryll Burger was sent to  her preferred pharmacy.  She will discontinue valsartan hand.  She will continue her other agents as mentioned above.  PCP follow-up within 1 week.  Has an appointment to follow-up with Dr. Gery Pray for renal artery stenosis later this month and a hypertension clinic visit for December.  She is agreeable to make these appointments.  Sternum precautions were discussed.  She was discharged in stable condition       Karmen Stabs, MD 12/06/22 1742    Gerhard Munch, MD 12/07/22 [phone]

## 2022-12-06 NOTE — Consult Note (Addendum)
Cardiology Consultation   Patient ID: DARINA HARTWELL MRN: [id number]; DOB: 05-23-1945  Admit date: 12/06/2022 Date of Consult: 12/06/2022  PCP:  Billie Lade, MD   [facility] Providers Cardiologist:  Nona Dell, MD  Electrophysiologist:  Lewayne Bunting, MD       Patient Profile:   TEMPERENCE ZENOR is a 77 y.o. female with a hx of  hypertension, CAD s/p PCI, HFpEF, pAF, s/p PPM, mild-moderate aortic stenosis, splenomegaly who is being seen 12/06/2022 for the evaluation of hypertensive urgency at the request of Riki Sheer PA-C.  History of Present Illness:   Ms. Mulvehill presented to the emergency department today for evaluation of new acute symptoms of headache, shortness of breath in the setting of hypertension as well as sinus congestion, ear pain, cough. She also notes ongoing symptoms of "fuzzy vision", increased shortness of breath, fatigue, nausea, and decreased exertional tolerance.  Denies chest pain.  In the emergency department today, patient appears ill and has a frequent dry cough.  She sounds nasally congested and feels warm to touch, possibly febrile. Apparently COVID-19 positive (no contact precaution notice on door). Patient's son in law and daughter are both present in the room.  All 3 are quite concerned about patient's ongoing blood pressure.  Patient's daughter is especially concerned she is going to have a stroke as a result of elevated blood pressure.  I recently saw this patient in clinic for evaluation of persistently elevated blood pressure.  At the time that I saw patient a couple of weeks ago she was consistently having systolic BP greater than 200 despite having had her Imdur and lisinopril increased by primary care.  Given lack of response to increased dosing of lisinopril and imdur, I switched patient to valsartan 160 mg and added amlodipine 5 mg.  Patient says that since this change blood pressure has been slightly improved but is still frequently in  the 180s to 190s.  Her blood pressure is typically the best in the morning after she takes her medicines and has been in the 160s to 170s.  Past Medical History:  Diagnosis Date   Asthma    CAD (coronary artery disease)    60-70% proximal LAD/diagonal bifurcational disease October 2015 at [facility] status DES x 2 to LAD/diagonal October 2015   Essential hypertension    Hyperlipidemia    Hypothyroidism    Sinus node dysfunction (HCC)    Biotronik pacemaker in place    Past Surgical History:  Procedure Laterality Date   ABDOMINAL HYSTERECTOMY     ANAL FISSURE REPAIR     CATARACT EXTRACTION W/PHACO Right 10/17/2014   Procedure: CATARACT EXTRACTION PHACO AND INTRAOCULAR LENS PLACEMENT (IOC);  Surgeon: Gemma Payor, MD;  Location: [facility];  Service: Ophthalmology;  Laterality: Right;  CDE: 4.88   CATARACT EXTRACTION W/PHACO Left 10/27/2014   Procedure: CATARACT EXTRACTION PHACO AND INTRAOCULAR LENS PLACEMENT (IOC);  Surgeon: Gemma Payor, MD;  Location: [facility];  Service: Ophthalmology;  Laterality: Left;  CDE:7.86   COLONOSCOPY N/A 04/16/2018   Procedure: COLONOSCOPY;  Surgeon: Malissa Hippo, MD;  Location: [facility] ENDO SUITE;  Service: Endoscopy;  Laterality: N/A;  830   FINGER SURGERY     NASAL SINUS SURGERY     PACEMAKER IMPLANT N/A 07/30/2016   Procedure: Pacemaker Implant Dual Chamber;  Surgeon: Marinus Maw, MD;  Location: [facility] INVASIVE CV LAB;  Service: Cardiovascular;  Laterality: N/A;   TONSILLECTOMY       Home Medications:  Prior  to Admission medications   Medication Sig Start Date End Date Taking? Authorizing Provider  amLODipine (NORVASC) 5 MG tablet Take 1 tablet (5 mg total) by mouth every evening. 11/18/22 02/16/23 Yes Perlie Gold, PA-C  aspirin 81 MG tablet Take 81 mg by mouth daily.   Yes [provider]  atorvastatin (LIPITOR) 20 MG tablet Take 20 mg by mouth at bedtime.  06/27/17  Yes [provider]  betamethasone valerate ointment (VALISONE) 0.1 % APPLY TO  THE AFFECTED AREA(S) TWICE DAILY 10/22/22  Yes Billie Lade, MD  calcium carbonate (OSCAL) 1500 (600 Ca) MG TABS tablet Take 1,500 mg by mouth daily with breakfast.   Yes [provider]  Cholecalciferol (VITAMIN D3) 2000 units capsule Take 2,000 Units by mouth daily at 12 noon.    Yes [provider]  clobetasol (TEMOVATE) 0.05 % external solution Apply 1 application topically daily as needed (psoriasis).    Yes [provider]  clopidogrel (PLAVIX) 75 MG tablet TAKE 1 TABLET BY MOUTH DAILY 03/14/22  Yes Jonelle Sidle, MD  hydrochlorothiazide (HYDRODIURIL) 12.5 MG tablet Take 12.5 mg by mouth daily. 07/17/22  Yes [provider]  isosorbide mononitrate (IMDUR) 60 MG 24 hr tablet Take 1 tablet (60 mg total) by mouth daily. 11/01/22 01/30/23 Yes Billie Lade, MD  levothyroxine (SYNTHROID) 75 MCG tablet Take 75 mcg by mouth every morning. 10/25/20  Yes [provider]  Multiple Vitamin (MULTIVITAMIN WITH MINERALS) TABS tablet Take 1 tablet by mouth daily at 12 noon.    Yes [provider]  pantoprazole (PROTONIX) 40 MG tablet Take 1 tablet (40 mg total) by mouth daily. 11/19/22  Yes Perlie Gold, PA-C  valsartan (DIOVAN) 160 MG tablet Take 1 tablet (160 mg total) by mouth every morning. 11/18/22  Yes Perlie Gold, PA-C    Inpatient Medications: Scheduled Meds:  Continuous Infusions:  PRN Meds:   Allergies:    Allergies  Allergen Reactions   Sulfa Antibiotics Rash    Social History:   Social History   Socioeconomic History   Marital status: Widowed    Spouse name: Not on file   Number of children: Not on file   Years of education: Not on file   Highest education level: Not on file  Occupational History   Not on file  Tobacco Use   Smoking status: Never   Smokeless tobacco: Never  Vaping Use   Vaping status: Never Used  Substance and Sexual Activity   Alcohol use: No    Alcohol/week: 0.0 standard drinks of  alcohol   Drug use: No   Sexual activity: Never  Other Topics Concern   Not on file  Social History Narrative   Not on file   Social Determinants of Health   Financial Resource Strain: Low Risk  (08/19/2022)   Overall Financial Resource Strain (CARDIA)    Difficulty of Paying Living Expenses: Not hard at all  Food Insecurity: No Food Insecurity (08/19/2022)   Hunger Vital Sign    Worried About Running Out of Food in the Last Year: Never true    Ran Out of Food in the Last Year: Never true  Transportation Needs: No Transportation Needs (08/19/2022)   PRAPARE - Administrator, Civil Service (Medical): No    Lack of Transportation (Non-Medical): No  Physical Activity: Sufficiently Active (08/19/2022)   Exercise Vital Sign    Days of Exercise per Week: 7 days    Minutes of Exercise per Session: 60  min  Stress: No Stress Concern Present (08/19/2022)   [name] of Occupational Health - Occupational Stress Questionnaire    Feeling of Stress : Not at all  Social Connections: Socially Integrated (08/19/2022)   Social Connection and Isolation Panel [NHANES]    Frequency of Communication with Friends and Family: More than three times a week    Frequency of Social Gatherings with Friends and Family: More than three times a week    Attends Religious Services: More than 4 times per year    Active Member of [employer] or Organizations: Yes    Attends Engineer, structural: More than 4 times per year    Marital Status: Married  Catering manager Violence: Not At Risk (08/19/2022)   Humiliation, Afraid, Rape, and Kick questionnaire    Fear of Current or Ex-Partner: No    Emotionally Abused: No    Physically Abused: No    Sexually Abused: No    Family History:    Family History  Problem Relation Age of Onset   Heart disease Father      ROS:  Please see the history of present illness.   All other ROS reviewed and negative.     Physical Exam/Data:   Vitals:    12/06/22 1059 12/06/22 1100  BP: (!) 192/64   Pulse: 77   Resp: 20   Temp: 98.5 F (36.9 C)   TempSrc: Oral   SpO2: 99%   Weight:  64.4 kg  Height:  4\' 11"  (1.499 m)   No intake or output data in the 24 hours ending 12/06/22 1501    12/06/2022   11:00 AM 11/18/2022   11:16 AM 11/15/2022    8:21 PM  Last 3 Weights  Weight (lbs) 142 lb 144 lb 9.6 oz 145 lb  Weight (kg) 64.411 kg 65.59 kg 65.772 kg     Body mass index is 28.68 kg/m.  General:  Ill appearing but no acute distress. Frequent coughing HEENT: normal Neck: no JVD Vascular: No carotid bruits; Distal pulses 2+ bilaterally Cardiac:  normal S1, S2; RRR; no murmur  Lungs:  faint end-inspiratory wheezing Abd: soft, nontender, no hepatomegaly  Ext: no edema Musculoskeletal:  No deformities, BUE and BLE strength normal and equal Skin: warm and dry  Neuro:  CNs 2-12 intact, no focal abnormalities noted Psych:  Normal affect   EKG:  The EKG was personally reviewed and demonstrates:  atrial paced Telemetry:  Telemetry was personally reviewed and demonstrates:  atrial paced with PVCs  Relevant CV Studies: 11/28/22 Renal Artery Duplex  Summary:  Largest Aortic Diameter: 2.2 cm    Renal:    Right: Normal size right kidney. Abnormal right Resistive Index.         Normal cortical thickness of right kidney. Evidence of a >        60% stenosis of the right renal artery. RRV flow present.  Left:  Normal size of left kidney. Abnormal left Resisitve Index.         Normal cortical thickness of the left kidney. Evidence of a >        60% stenosis in the left renal artery. LRV flow present.  Mesenteric:  Normal Celiac artery and Superior Mesenteric artery findings.   11/20/21 TTE  IMPRESSIONS     1. Left ventricular ejection fraction, by estimation, is 65 to 70%. The  left ventricle has normal function. The left ventricle has no regional  wall motion abnormalities. Left ventricular diastolic parameters are  consistent  with Grade I diastolic  dysfunction (impaired relaxation). Elevated left ventricular end-diastolic  pressure.   2. Right ventricular systolic function is normal. The right ventricular  size is normal. There is mildly elevated pulmonary artery systolic  pressure. The estimated right ventricular systolic pressure is 40.2 mmHg.   3. Left atrial size was severely dilated.   4. The mitral valve is abnormal. Mild mitral valve regurgitation.   5. Tricuspid valve regurgitation is moderate.   6. The aortic valve is tricuspid. Aortic valve regurgitation is trivial.  Mild to moderate aortic valve stenosis. Aortic valve mean gradient  measures 8.0 mmHg. Dimentionless index 0.63.   7. The inferior vena cava is normal in size with greater than 50%  respiratory variability, suggesting right atrial pressure of 3 mmHg.   Comparison(s): Prior images reviewed side by side. LVEF vigorous at  65-70%. Mild diastolic dysfunction with increased filling pressures.  Mildly increased RVSP. Mild to moderate aortic stenosis without  paradoxically low gradient.   FINDINGS   Left Ventricle: Left ventricular ejection fraction, by estimation, is 65  to 70%. The left ventricle has normal function. The left ventricle has no  regional wall motion abnormalities. The left ventricular internal cavity  size was normal in size. There is   borderline left ventricular hypertrophy. Left ventricular diastolic  parameters are consistent with Grade I diastolic dysfunction (impaired  relaxation). Elevated left ventricular end-diastolic pressure.   Right Ventricle: The right ventricular size is normal. No increase in  right ventricular wall thickness. Right ventricular systolic function is  normal. There is mildly elevated pulmonary artery systolic pressure. The  tricuspid regurgitant velocity is 3.05   m/s, and with an assumed right atrial pressure of 3 mmHg, the estimated  right ventricular systolic pressure is 40.2 mmHg.    Left Atrium: Left atrial size was severely dilated.   Right Atrium: Right atrial size was normal in size.   Pericardium: There is no evidence of pericardial effusion.   Mitral Valve: The mitral valve is abnormal. There is mild thickening of  the mitral valve leaflet(s). Mild mitral annular calcification. Mild  mitral valve regurgitation.   Tricuspid Valve: The tricuspid valve is grossly normal. Tricuspid valve  regurgitation is moderate.   Aortic Valve: The aortic valve is tricuspid. There is mild to moderate  aortic valve annular calcification. Aortic valve regurgitation is trivial.  Mild to moderate aortic stenosis is present. Aortic valve mean gradient  measures 8.0 mmHg. Aortic valve peak   gradient measures 15.3 mmHg. Aortic valve area, by VTI measures 1.05 cm.   Pulmonic Valve: The pulmonic valve was grossly normal. Pulmonic valve  regurgitation is trivial.   Aorta: The aortic root is normal in size and structure.   Venous: The inferior vena cava is normal in size with greater than 50%  respiratory variability, suggesting right atrial pressure of 3 mmHg.   IAS/Shunts: No atrial level shunt detected by color flow Doppler.   Additional Comments: A device lead is visualized.    Laboratory Data:  High Sensitivity Troponin:   Recent Labs  Lab 12/06/22 1142  TROPONINIHS 9     Chemistry Recent Labs  Lab 12/06/22 1142  NA 131*  K 4.2  CL 99  CO2 25  GLUCOSE 100*  BUN 16  CREATININE 1.00  CALCIUM 9.0  GFRNONAA 58*  ANIONGAP 7    Recent Labs  Lab 12/06/22 1142  PROT 6.2*  ALBUMIN 3.6  AST 23  ALT 18  ALKPHOS 86  BILITOT  1.9*   Lipids No results for input(s): "CHOL", "TRIG", "HDL", "LABVLDL", "LDLCALC", "CHOLHDL" in the last 168 hours.  Hematology Recent Labs  Lab 12/06/22 1142  WBC 5.3  RBC 4.05  HGB 11.5*  HCT 34.7*  MCV 85.7  MCH 28.4  MCHC 33.1  RDW 13.9  PLT 134*   Thyroid No results for input(s): "TSH", "FREET4" in the last 168  hours.  BNPNo results for input(s): "BNP", "PROBNP" in the last 168 hours.  DDimer No results for input(s): "DDIMER" in the last 168 hours.   Radiology/Studies:  DG Chest Port 1 View  Result Date: 12/06/2022 CLINICAL DATA:  Hypertension EXAM: PORTABLE CHEST 1 VIEW COMPARISON:  07/31/2016 FINDINGS: Hyperinflation. Chronic interstitial lung changes. No consolidation, pneumothorax or effusion. No edema. Normal cardiopericardial silhouette. Left upper chest battery pack for pacemaker leads along the right side of the heart. Possible coronary stents. Overlapping cardiac leads. IMPRESSION: Hyperinflation with chronic changes.  Pacemaker. Electronically Signed   By: Karen Kays M.D.   On: 12/06/2022 12:59   CT Head Wo Contrast  Result Date: 12/06/2022 CLINICAL DATA:  Headache, new onset EXAM: CT HEAD WITHOUT CONTRAST TECHNIQUE: Contiguous axial images were obtained from the base of the skull through the vertex without intravenous contrast. RADIATION DOSE REDUCTION: This exam was performed according to the departmental dose-optimization program which includes automated exposure control, adjustment of the mA and/or kV according to patient size and/or use of iterative reconstruction technique. COMPARISON:  07/27/2019 FINDINGS: Brain: No evidence of acute infarction, hemorrhage, hydrocephalus, extra-axial collection or mass lesion/mass effect. Vascular: No hyperdense vessel or unexpected calcification. Skull: Normal. Negative for fracture or focal lesion. Sinuses/Orbits: No acute finding. Mild opacification of the right maxillary sinus. IMPRESSION: No acute finding.  Normal appearance of the brain. Electronically Signed   By: Tiburcio Pea M.D.   On: 12/06/2022 12:53     Assessment and Plan:   Resistant Hypertension Persistent high systolic blood pressure despite being on multiple antihypertensive medications. Patient reporting symptoms of chronic blurry vision, nausea, fatigue with more acute onset of  dyspnea, cough, sinus congestion (COVID-19 positive today). Recent renal artery duplex with >60% bilateral stenosis.   -Suspect that acute symptoms/malaise are more a function of COVID-19 rather than ongoing chronic hypertension. -Switch Valsartan 160mg  to Entresto 97-103mg  BID -Continue Amlodipine 5mg  -Continue Imdur 60mg . Had previously been on 30mg  until PCP changed. Would consider decreasing this if BP responds to [facility] as some of her headache may be a side effect of increased dosing. -Could consider adding Coreg if additional BP reduction needed  -Continue with plans for outpatient follow up with Dr. Allyson Sabal to further evaluate renal artery stenosis -Has htn clinic appointment in December  CAD status post DES x 2 to the LAD/diagonal bifurcation in 2015  -No symptoms consistent with angina reported and troponin negative today.  -Continue ASA, Plavix, Lipitor, Imdur  HFpEF Prior echocardiogram in 2021 with hyperdynamic LV function and grade I DD. Patient without symptoms of CHF but will need better controlled BP to prevent progression.   S/P PPM with sinus node dysfunction 11/10/22 device interrogation shows stable function. Last seen in EP clinic in June 2024.   Risk Assessment/Risk Scores:       For questions or updates, please contact [facility] Please consult [email or web address] for contact info under    Signed, Perlie Gold, PA-C  12/06/2022 3:01 PM   Personally seen and examined. Agree with APP above with the following comments:  Miss Aguilera, a 77 year old patient with  a history of coronary artery disease (CAD) with a prior percutaneous intervention (PCI), heart failure with preserved ejection fraction (HFpEF), mild to moderate aortic stenosis, and resistant hypertension, has been experiencing hypertensive emergencies with systolic blood pressures frequently exceeding 200. This is despite the use of isosorbide mononitrate and lisinopril, which were later transitioned  to valsartan and Norvasc Perlie Gold PA-C.  Historically the patient's aortic stenosis is closer to mild with a mean gradient of 8 mm HG, and the last stress test in 2023 was unremarkable. The patient has not had a PCI since 2015. Previously, the patient was on an SGLT2 inhibitor but did not tolerate Farxiga (HFpEF Treatment, has better BP control)  Despite medication adjustments, the patient's blood pressure remains elevated.  Recently, the patient has been feeling very tired, with no energy, and short of breath. This, along with generalized weakness and persistently high blood pressures, led to an evaluation in the ED. The patient also reported chronic headaches and blurred vision.  Found to have COVID-19  Exam notable for  VITALS: P- 68, BP- 175/58 GENERAL: Ill-appearing. CHEST: Mild tachypnea, bilateral bronchi. ABDOMEN: Splenomegaly EXTREMITIES: No lower extremity edema.  Personally reviewed relevant tests;  Lab results showed new hyponatremia with a sodium level of 131, an increase in bilirubin to 1.9, stable anemia with a hemoglobin of 11.5, and worsening thrombocytopenia with platelets down from 146 to 134. These findings may be related to a recent COVID-19 infection.  An EKG showed sinus rhythm and evidence of left ventricular hypertrophy with a PR interval of 158. The patient's echocardiogram from 2023 showed left atrial dilation, normal RV and LV function, some degree of RV dilation, and moderate tricuspid regurgitation. Tele: sinus rhythm, left ventricular hypertrophy, Rare PVC, very rare A pacing  Would recommend   Resistant Hypertension Despite use of isosorbide mononitrate, lisinopril, valsartan, and Norvasc, blood pressure remains elevated. Bilateral moderate renal artery stenosis noted. Patient currently has active COVID-19 infection which may be contributing to her symptoms. -Transition valsartan to Entresto 97/103. -Continue amlodipine 5mg . -Continue isosorbide  mononitrate with potential to wean down if blood pressure control is achieved. -If blood pressure remains elevated, increase amlodipine to 10mg . - if this still does not improve blood pressure, would start MRA - despite her rare PACs, I am hoping to avoid Coreg as she has minimal RV pacing right now - she has follow up with Dr. Allyson Sabal for RAS later this month and see HTN clinic in December  Coronary Artery Disease History of percutaneous intervention in 2015. Last stress test in 2023 was normal. Patient is currently on aspirin, Plavix, Lipitor, and Imdur. -Continue aspirin, Plavix, Lipitor, and Imdur. -Discuss potential switch to antiplatelet monotherapy with primary cardiologist as an outpatient.  Heart Failure with Preserved Ejection Fraction - Patient reports feeling very tired, with no energy, and shortness of breath. Echocardiogram from 2023 showed left atrial dilation, normal RV, normal LV function, some degree of RV dilation, and moderate tricuspid regurgitation. - ARNI transition as above - unclear what her intolerance to SGLT2i was (she does not remember) she appears euvolemic and this could be re-trialed when BP is improved  Mild to Moderate Aortic Stenosis Echocardiogram from 2023 showed mild decrease in stroke volume index and normal DVI. - If SOB does not resolve post COVID-19 needs f/u echo  COVID-19 Infection Currently active. Likely cause of her symptoms -Defer management to primary team.  Pacemaker Managed by Dr. Ladona Ridgel. Telemetry shows evidence of some atrial pacing and occasional premature ventricular contractions. -Continue  current management by Dr. Ladona Ridgel. Avoid significant beta blockade if possible due to potential poor tolerance of significant RV pacing.  Hyponatremia and Hyperbilirubinemia New hyponatremia (sodium 131) and increase in bilirubin (1.9). -Defer management to primary team.  Thrombocytopenia Worsening, platelet count down from 146 to 134. May be  related to COVID-19 infection.  She has splenomegaly and a portion of this may contribute, she see heme as an outpatient (Dr. Kirtland Bouchard).   If patient is planned for discharge from Covid-19 and the non cardiac symptoms, needs BMP in one week and close follow up with Dr. Ival Bible team.  If not, our service will follow along in consultation.  Riley Lam, MD FASE [facility] Cardiologist [facility]  [address] [place], [address] [place], [place] [address] [phone]  4:56 PM

## 2022-12-06 NOTE — ED Triage Notes (Signed)
Patient recently had blood pressure medication changed, patient reports feeling tired and has no energy this past week.  Patient reports getting short of breath states "can't brush my teeth without sitting down twice".   Patient's BP this morning was SBP 200s.   Patient reports took her BP medication this morning.

## 2022-12-06 NOTE — ED Provider Notes (Signed)
[place] EMERGENCY DEPARTMENT AT [facility] Provider Note   CSN: [id number] Arrival date & time: 12/06/22  1039     History  Chief Complaint  Patient presents with   Weakness   Hypertension   HPI Amy [name] is a 77 y.o. female with hypertension, sinus node dysfunction with pacemaker, hyperlipidemia presenting for generalized weakness and hypertension.  States she has been "dealing with high blood pressure" for couple of months now.  Now recently started valsartan, amlodipine and still taking hydrochlorothiazide.  Today she endorses a headache she believes started 2 days ago and visual blurriness in both eyes has been going on for 3 to 4 weeks.  Denies chest pain shortness of breath, abdominal pain and denies urinary changes.  States she also had a renal ultrasound for concern for RAS and she states there were significant stenosis in both renal arteries.  Also states she has had some nasal congestion and is felt generally weak for the last 3 days but no fever at home and no cough.   Weakness Hypertension       Home Medications Prior to Admission medications   Medication Sig Start Date End Date Taking? Authorizing Provider  amLODipine (NORVASC) 5 MG tablet Take 1 tablet (5 mg total) by mouth every evening. 11/18/22 02/16/23 Yes Perlie Gold, PA-C  aspirin 81 MG tablet Take 81 mg by mouth daily.   Yes [provider]  atorvastatin (LIPITOR) 20 MG tablet Take 20 mg by mouth at bedtime.  06/27/17  Yes [provider]  betamethasone valerate ointment (VALISONE) 0.1 % APPLY TO THE AFFECTED AREA(S) TWICE DAILY 10/22/22  Yes Billie Lade, MD  calcium carbonate (OSCAL) 1500 (600 Ca) MG TABS tablet Take 1,500 mg by mouth daily with breakfast.   Yes [provider]  Cholecalciferol (VITAMIN D3) 2000 units capsule Take 2,000 Units by mouth daily at 12 noon.    Yes [provider]  clobetasol (TEMOVATE) 0.05 % external solution Apply 1  application topically daily as needed (psoriasis).    Yes [provider]  clopidogrel (PLAVIX) 75 MG tablet TAKE 1 TABLET BY MOUTH DAILY 03/14/22  Yes Jonelle Sidle, MD  hydrochlorothiazide (HYDRODIURIL) 12.5 MG tablet Take 12.5 mg by mouth daily. 07/17/22  Yes [provider]  isosorbide mononitrate (IMDUR) 60 MG 24 hr tablet Take 1 tablet (60 mg total) by mouth daily. 11/01/22 01/30/23 Yes Billie Lade, MD  levothyroxine (SYNTHROID) 75 MCG tablet Take 75 mcg by mouth every morning. 10/25/20  Yes [provider]  Multiple Vitamin (MULTIVITAMIN WITH MINERALS) TABS tablet Take 1 tablet by mouth daily at 12 noon.    Yes [provider]  pantoprazole (PROTONIX) 40 MG tablet Take 1 tablet (40 mg total) by mouth daily. 11/19/22  Yes Perlie Gold, PA-C  valsartan (DIOVAN) 160 MG tablet Take 1 tablet (160 mg total) by mouth every morning. 11/18/22  Yes Perlie Gold, PA-C      Allergies    Sulfa antibiotics    Review of Systems   Review of Systems  Neurological:  Positive for weakness.    Physical Exam   Vitals:   12/06/22 1200 12/06/22 1300  BP: (!) 176/58 (!) 175/58  Pulse: 72 64  Resp: 15 17  Temp:    SpO2: 99% 97%    CONSTITUTIONAL:  well-appearing, NAD NEURO:  GCS 15. Speech is goal oriented. No deficits appreciated to CN III-XII; symmetric eyebrow raise, no facial drooping, tongue midline. Patient has equal  grip strength bilaterally with 5/5 strength against resistance in all major muscle groups bilaterally. Sensation to light touch intact. Patient moves extremities without ataxia. Normal finger-nose-finger. Did not assess gait.  EYES:  eyes equal and reactive ENT/NECK:  Supple, no stridor  CARDIO:  regular rate and rhythm, appears well-perfused  PULM:  No respiratory distress, CTAB GI/GU:  non-distended, soft MSK/SPINE:  No gross deformities, no edema, moves all extremities  SKIN:  no rash, atraumatic  *Additional and/or pertinent  findings included in MDM below   ED Results / Procedures / Treatments   Labs (all labs ordered are listed, but only abnormal results are displayed) Labs Reviewed  RESP PANEL BY RT-PCR (RSV, FLU A&B, COVID)  RVPGX2 - Abnormal; Notable for the following components:      Result Value   SARS Coronavirus 2 by RT PCR POSITIVE (*)    All other components within normal limits  CBC - Abnormal; Notable for the following components:   Hemoglobin 11.5 (*)    HCT 34.7 (*)    Platelets 134 (*)    All other components within normal limits  COMPREHENSIVE METABOLIC PANEL - Abnormal; Notable for the following components:   Sodium 131 (*)    Glucose, Bld 100 (*)    Total Protein 6.2 (*)    Total Bilirubin 1.9 (*)    GFR, Estimated 58 (*)    All other components within normal limits  URINALYSIS, ROUTINE W REFLEX MICROSCOPIC  TROPONIN I (HIGH SENSITIVITY)    EKG EKG Interpretation Date/Time:  Friday December 06 2022 11:34:53 EDT Ventricular Rate:  68 PR Interval:  158 QRS Duration:  79 QT Interval:  368 QTC Calculation: 392 R Axis:   -15  Text Interpretation: Sinus rhythm Abnormal R-wave progression, early transition Left ventricular hypertrophy No significant change since last tracing Confirmed by Gwyneth Sprout ([id number]) on 12/06/2022 12:12:10 PM  Radiology DG Chest Port 1 View  Result Date: 12/06/2022 CLINICAL DATA:  Hypertension EXAM: PORTABLE CHEST 1 VIEW COMPARISON:  07/31/2016 FINDINGS: Hyperinflation. Chronic interstitial lung changes. No consolidation, pneumothorax or effusion. No edema. Normal cardiopericardial silhouette. Left upper chest battery pack for pacemaker leads along the right side of the heart. Possible coronary stents. Overlapping cardiac leads. IMPRESSION: Hyperinflation with chronic changes.  Pacemaker. Electronically Signed   By: Karen Kays M.D.   On: 12/06/2022 12:59   CT Head Wo Contrast  Result Date: 12/06/2022 CLINICAL DATA:  Headache, new onset EXAM: CT HEAD  WITHOUT CONTRAST TECHNIQUE: Contiguous axial images were obtained from the base of the skull through the vertex without intravenous contrast. RADIATION DOSE REDUCTION: This exam was performed according to the departmental dose-optimization program which includes automated exposure control, adjustment of the mA and/or kV according to patient size and/or use of iterative reconstruction technique. COMPARISON:  07/27/2019 FINDINGS: Brain: No evidence of acute infarction, hemorrhage, hydrocephalus, extra-axial collection or mass lesion/mass effect. Vascular: No hyperdense vessel or unexpected calcification. Skull: Normal. Negative for fracture or focal lesion. Sinuses/Orbits: No acute finding. Mild opacification of the right maxillary sinus. IMPRESSION: No acute finding.  Normal appearance of the brain. Electronically Signed   By: Tiburcio Pea M.D.   On: 12/06/2022 12:53    Procedures Procedures    Medications Ordered in ED Medications  sacubitril-valsartan (ENTRESTO) 97-103 mg per tablet (has no administration in time range)  acetaminophen (TYLENOL) tablet 1,000 mg (1,000 mg Oral Given 12/06/22 1527)    ED Course/ Medical Decision Making/ A&P Clinical Course as of 12/06/22 1543  Fri Dec 06, 2022  1523 HTN refractory to meds, new diagnosis of renal arteyr stenosis; cardiology to see; f/u recommendations for admission vs outpatient management.  [GD]    Clinical Course User Index [GD] Karmen Stabs, MD                                 Medical Decision Making Amount and/or Complexity of Data Reviewed Labs: ordered. Radiology: ordered.  Risk OTC drugs.   Initial Impression and Ddx -year-old well-appearing female presenting for hypertension and shortness of breath and generalized weakness.  Exam is unremarkable but did note she was hypertensive.  DDx includes hypertensive emergency, stroke, ACS, renal artery stenosis, pneumonia, URI. Patient PMH that increases complexity of ED encounter:   hypertension, sinus node dysfunction with pacemaker, hyperlipidemia   Interpretation of Diagnostics - I independent reviewed and interpreted the labs as followed: covid positive, reduced GFR  - I independently visualized the following imaging with scope of interpretation limited to determining acute life threatening conditions related to emergency care: No acute findings on chest x-ray or CT head  -I personally reviewed interpret EKG which revealed sinus rhythm  Patient Reassessment and Ultimate Disposition/Management Overall workup does not suggest hypertensive emergency.  Did review your chart and found that recent renal ultrasound revealed 60% stenosis in both renal arteries.  This is likely contributing to her hypertension.  Patient is also had medications changed with the addition of amlodipine and valsartan and continuing hydrochlorothiazide and discontinuing lisinopril.  Family is understandably concerned as she has been to multiple specialist.  Concerned about the risk of her having a stroke or heart attack.  I voiced this concern to cardiology service who advised that they would send someone to evaluate her in person.  Plan will be based on cardiology's recommendations.  Signed out patient to Dr. Karmen Stabs.  On reassessment she remains well, nontoxic and hemodynamically stable.  Also found to have COVID.  Patient management required discussion with the following services or consulting groups:  Cardiology  Complexity of Problems Addressed Acute complicated illness or Injury  Additional Data Reviewed and Analyzed Further history obtained from: Further history from spouse/family member and Past medical history and medications listed in the EMR  Patient Encounter Risk Assessment Consideration of hospitalization         Final Clinical Impression(s) / ED Diagnoses Final diagnoses:  Hypertension, unspecified type    Rx / DC Orders ED Discharge Orders     None          Gareth Eagle, PA-C 12/06/22 1543    Gwyneth Sprout, MD 12/10/22 2346

## 2022-12-06 NOTE — Telephone Encounter (Signed)
Tried calling back unable to reach anyone.

## 2022-12-06 NOTE — Discharge Instructions (Signed)
You were seen today for shortness of breath, body aches, and high blood pressure.  While you were here we checked some labs and you have COVID.  We had her cardiologist see you due to high blood pressure despite several medications they have recommended changing your valsartan and to Entresto.  Please discontinue your valsartan pick up this new medication and take it twice a day as prescribed.  Please continue your Imdur and amlodipine as prescribed.   Call your PCP for an appointment in 1 week. You need a repeat BMP to check your sodium and renal function. They also can check on your recovery from COVID.   Please follow up with Dr. Allyson Sabal for renal artery stenosis as planned.   Please follow up in hypertension clinic in December.

## 2022-12-08 NOTE — Patient Instructions (Signed)

## 2022-12-08 NOTE — Progress Notes (Unsigned)
   Established Patient Office Visit   Subjective  Patient ID: Amy [name], female    DOB: 1945/07/29  Age: 78 y.o. MRN: [id number]  No chief complaint on file.   She  has a past medical history of Asthma, CAD (coronary artery disease), Essential hypertension, Hyperlipidemia, Hypothyroidism, and Sinus node dysfunction (HCC).  Patient complains of {pneum rfv:14244}. Patient describes symptoms of {pneumonia symptoms:12520}. Symptoms began {0-10:33138} {unit:11::"days"} ago and are {course:17::"unchanged"} since that time. Patient denies {pneumonia denies:14121}. Treatment thus far includes {pneumonia tx to date:14122} Past pulmonary history is significant for {resp history:412}     ROS    Objective:     There were no vitals taken for this visit. {Vitals History (Optional):23777}  Physical Exam   No results found for any visits on 12/09/22.  The ASCVD Risk score (Arnett DK, et al., 2019) failed to calculate for the following reasons:   Cannot find a previous HDL lab   Cannot find a previous total cholesterol lab    Assessment & Plan:  There are no diagnoses linked to this encounter.  No follow-ups on file.   Cruzita Lederer Newman Nip, FNP

## 2022-12-09 ENCOUNTER — Ambulatory Visit: Payer: Medicare Other | Admitting: Family Medicine

## 2022-12-13 ENCOUNTER — Encounter: Payer: Self-pay | Admitting: Internal Medicine

## 2022-12-13 ENCOUNTER — Ambulatory Visit (INDEPENDENT_AMBULATORY_CARE_PROVIDER_SITE_OTHER): Payer: Medicare Other | Admitting: Internal Medicine

## 2022-12-13 VITALS — BP 142/58 | HR 67 | Ht 59.0 in | Wt 143.0 lb

## 2022-12-13 DIAGNOSIS — K219 Gastro-esophageal reflux disease without esophagitis: Secondary | ICD-10-CM | POA: Diagnosis not present

## 2022-12-13 DIAGNOSIS — I1A0 Resistant hypertension: Secondary | ICD-10-CM | POA: Diagnosis not present

## 2022-12-13 MED ORDER — VALSARTAN 320 MG PO TABS: 320.0000 mg | ORAL_TABLET | Freq: Every day | ORAL | 2 refills | Status: DC

## 2022-12-13 MED ORDER — VALSARTAN 160 MG PO TABS: 160.0000 mg | ORAL_TABLET | Freq: Every day | ORAL | Status: DC

## 2022-12-13 NOTE — Assessment & Plan Note (Signed)
Returning here today for HTN follow-up.  Last evaluated by me on 9/27.  Her clinical course since that time is described as above.  Bilateral renal artery stenosis has been identified.  Her currently prescribed antihypertensive regimen includes Entresto 97-103 mg twice daily, HCTZ 12.5 mg daily, amlodipine 5 mg daily, and Imdur 60 mg daily.  She reports today that she has remained on valsartan 160 mg daily as Entresto was too expensive.  BP readings are improving, 142/58 in office today.  She reports multiple home readings with systolic pressures reaching 120-130 mmHg.  -No additional medication changes have been made today.  She is scheduled for cardiology follow-up on Monday (11/11) and will also see Dr. Allyson Sabal on 11/12 in the setting of bilateral renal artery stenosis.

## 2022-12-13 NOTE — Progress Notes (Signed)
Established Patient Office Visit  Subjective   Patient ID: Amy [name], female    DOB: Aug 11, 1945  Age: 77 y.o. MRN: [id number]  Chief Complaint  Patient presents with   Follow-up   Amy [name] returns to care today for HTN follow-up.  She was last evaluated by me on 9/27.  Her chief concern at that time was poorly controlled HTN.  Imdur was increased to 60 mg daily.  She was continued on lisinopril and HCTZ at their current doses.  In the interim, she was evaluated at [facility] for an acute visit on 10/11 endorsing diarrhea.  ED presentation on 10/11 as well for hypertension.  Workup was reassuring and she was discharged home.  Evaluated by cardiology on 10/14.  Lisinopril was discontinued in favor of valsartan.  Amlodipine 5 mg was added at night.  She was also referred to the advanced hypertension clinic.  Workup initiated for resistant hypertension.  She then presented to the emergency department on 11/1 for weakness and hypertension.  Evaluated by cardiology while in the emergency department.  Valsartan was discontinued in favor of Entresto.  Resistant HTN workup revealed bilateral renal artery stenosis.  She has been referred to Dr. Allyson Sabal and has an appointment scheduled next week (11/12).  Amy [name] reports feeling "fabulous" today.  She is asymptomatic and has no acute concerns to discuss.  She reports that she has remained on valsartan because Entresto was too expensive.  Past Medical History:  Diagnosis Date   Asthma    CAD (coronary artery disease)    60-70% proximal LAD/diagonal bifurcational disease October 2015 at [facility] status DES x 2 to LAD/diagonal October 2015   Essential hypertension    Hyperlipidemia    Hypothyroidism    Sinus node dysfunction (HCC)    Biotronik pacemaker in place   Past Surgical History:  Procedure Laterality Date   ABDOMINAL HYSTERECTOMY     ANAL FISSURE REPAIR     CATARACT EXTRACTION W/PHACO Right 10/17/2014   Procedure: CATARACT EXTRACTION PHACO AND  INTRAOCULAR LENS PLACEMENT (IOC);  Surgeon: Gemma Payor, MD;  Location: [facility] ORS;  Service: Ophthalmology;  Laterality: Right;  CDE: 4.88   CATARACT EXTRACTION W/PHACO Left 10/27/2014   Procedure: CATARACT EXTRACTION PHACO AND INTRAOCULAR LENS PLACEMENT (IOC);  Surgeon: Gemma Payor, MD;  Location: [facility] ORS;  Service: Ophthalmology;  Laterality: Left;  CDE:7.86   COLONOSCOPY N/A 04/16/2018   Procedure: COLONOSCOPY;  Surgeon: Malissa Hippo, MD;  Location: [facility] ENDO SUITE;  Service: Endoscopy;  Laterality: N/A;  830   FINGER SURGERY     NASAL SINUS SURGERY     PACEMAKER IMPLANT N/A 07/30/2016   Procedure: Pacemaker Implant Dual Chamber;  Surgeon: Marinus Maw, MD;  Location: [facility] INVASIVE CV LAB;  Service: Cardiovascular;  Laterality: N/A;   TONSILLECTOMY     Social History   Tobacco Use   Smoking status: Never   Smokeless tobacco: Never  Vaping Use   Vaping status: Never Used  Substance Use Topics   Alcohol use: No    Alcohol/week: 0.0 standard drinks of alcohol   Drug use: No   Family History  Problem Relation Age of Onset   Heart disease Father    Allergies  Allergen Reactions   Sulfa Antibiotics Rash   Review of Systems  Constitutional:  Negative for chills and fever.  HENT:  Negative for sore throat.   Respiratory:  Negative for cough and shortness of breath.   Cardiovascular:  Negative for chest pain, palpitations and  leg swelling.  Gastrointestinal:  Negative for abdominal pain, blood in stool, constipation, diarrhea, nausea and vomiting.  Genitourinary:  Negative for dysuria and hematuria.  Musculoskeletal:  Negative for myalgias.  Skin:  Negative for itching and rash.  Neurological:  Negative for dizziness and headaches.  Psychiatric/Behavioral:  Negative for depression and suicidal ideas.      Objective:     BP (!) 142/58   Pulse 67   Ht 4\' 11"  (1.499 m)   Wt 143 lb (64.9 kg)   SpO2 98%   BMI 28.88 kg/m  BP Readings from Last 3 Encounters:  12/13/22 (!) 142/58   12/06/22 (!) 148/70  11/18/22 (!) 168/60   Physical Exam Vitals reviewed.  Constitutional:      General: She is not in acute distress.    Appearance: Normal appearance. She is not toxic-appearing.  HENT:     Head: Normocephalic and atraumatic.     Right Ear: External ear normal.     Left Ear: External ear normal.     Nose: Nose normal. No congestion or rhinorrhea.     Mouth/Throat:     Mouth: Mucous membranes are moist.     Pharynx: Oropharynx is clear. No oropharyngeal exudate or posterior oropharyngeal erythema.  Eyes:     General: No scleral icterus.    Extraocular Movements: Extraocular movements intact.     Conjunctiva/sclera: Conjunctivae normal.     Pupils: Pupils are equal, round, and reactive to light.  Cardiovascular:     Rate and Rhythm: Normal rate and regular rhythm.     Pulses: Normal pulses.     Heart sounds: Murmur heard.     No friction rub. No gallop.  Pulmonary:     Effort: Pulmonary effort is normal.     Breath sounds: Normal breath sounds. No wheezing, rhonchi or rales.  Abdominal:     General: Abdomen is flat. Bowel sounds are normal. There is no distension.     Palpations: Abdomen is soft.     Tenderness: There is no abdominal tenderness.  Musculoskeletal:        General: No swelling. Normal range of motion.     Cervical back: Normal range of motion.     Right lower leg: No edema.     Left lower leg: No edema.  Lymphadenopathy:     Cervical: No cervical adenopathy.  Skin:    General: Skin is warm and dry.     Capillary Refill: Capillary refill takes less than 2 seconds.     Coloration: Skin is not jaundiced.  Neurological:     General: No focal deficit present.     Mental Status: She is alert and oriented to person, place, and time.  Psychiatric:        Mood and Affect: Mood normal.        Behavior: Behavior normal.   Last CBC Lab Results  Component Value Date   WBC 5.3 12/06/2022   HGB 11.5 (L) 12/06/2022   HCT 34.7 (L) 12/06/2022    MCV 85.7 12/06/2022   MCH 28.4 12/06/2022   RDW 13.9 12/06/2022   PLT 134 (L) 12/06/2022   Last metabolic panel Lab Results  Component Value Date   GLUCOSE 100 (H) 12/06/2022   NA 131 (L) 12/06/2022   K 4.2 12/06/2022   CL 99 12/06/2022   CO2 25 12/06/2022   BUN 16 12/06/2022   CREATININE 1.00 12/06/2022   GFRNONAA 58 (L) 12/06/2022   CALCIUM 9.0 12/06/2022   PROT  6.2 (L) 12/06/2022   ALBUMIN 3.6 12/06/2022   BILITOT 1.9 (H) 12/06/2022   ALKPHOS 86 12/06/2022   AST 23 12/06/2022   ALT 18 12/06/2022   ANIONGAP 7 12/06/2022     Assessment & Plan:   Problem List Items Addressed This Visit       Resistant hypertension - Primary    Returning here today for HTN follow-up.  Last evaluated by me on 9/27.  Her clinical course since that time is described as above.  Bilateral renal artery stenosis has been identified.  Her currently prescribed antihypertensive regimen includes Entresto 97-103 mg twice daily, HCTZ 12.5 mg daily, amlodipine 5 mg daily, and Imdur 60 mg daily.  She reports today that she has remained on valsartan 160 mg daily as Entresto was too expensive.  BP readings are improving, 142/58 in office today.  She reports multiple home readings with systolic pressures reaching 120-130 mmHg.  -No additional medication changes have been made today.  She is scheduled for cardiology follow-up on Monday (11/11) and will also see Dr. Allyson Sabal on 11/12 in the setting of bilateral renal artery stenosis.      GERD (gastroesophageal reflux disease)    Reports today that symptoms have further improved since switching from omeprazole to pantoprazole.  No further changes are indicated today.      Return in about 3 months (around 03/15/2023).   Billie Lade, MD

## 2022-12-13 NOTE — Patient Instructions (Signed)
It was a pleasure to see you today.  Thank you for giving [place] the opportunity to be involved in your care.  Below is a brief recap of your visit and next steps.  We will plan to see you again in 3 months.  Summary No additional medication changes today Continue blood pressure medications as currently prescribed Cardiology follow up is scheduled for next week

## 2022-12-13 NOTE — Assessment & Plan Note (Signed)
Reports today that symptoms have further improved since switching from omeprazole to pantoprazole.  No further changes are indicated today.

## 2022-12-16 ENCOUNTER — Ambulatory Visit: Payer: Medicare Other | Attending: Nurse Practitioner | Admitting: Nurse Practitioner

## 2022-12-16 ENCOUNTER — Encounter: Payer: Self-pay | Admitting: Nurse Practitioner

## 2022-12-16 VITALS — BP 112/70 | HR 66 | Ht <= 58 in | Wt 144.2 lb

## 2022-12-16 DIAGNOSIS — I1A0 Resistant hypertension: Secondary | ICD-10-CM | POA: Insufficient documentation

## 2022-12-16 DIAGNOSIS — I739 Peripheral vascular disease, unspecified: Secondary | ICD-10-CM | POA: Diagnosis not present

## 2022-12-16 DIAGNOSIS — I5032 Chronic diastolic (congestive) heart failure: Secondary | ICD-10-CM | POA: Diagnosis not present

## 2022-12-16 DIAGNOSIS — R161 Splenomegaly, not elsewhere classified: Secondary | ICD-10-CM | POA: Insufficient documentation

## 2022-12-16 DIAGNOSIS — R790 Abnormal level of blood mineral: Secondary | ICD-10-CM | POA: Insufficient documentation

## 2022-12-16 DIAGNOSIS — I2510 Atherosclerotic heart disease of native coronary artery without angina pectoris: Secondary | ICD-10-CM | POA: Insufficient documentation

## 2022-12-16 DIAGNOSIS — Z950 Presence of cardiac pacemaker: Secondary | ICD-10-CM | POA: Insufficient documentation

## 2022-12-16 DIAGNOSIS — I480 Paroxysmal atrial fibrillation: Secondary | ICD-10-CM | POA: Diagnosis not present

## 2022-12-16 DIAGNOSIS — I350 Nonrheumatic aortic (valve) stenosis: Secondary | ICD-10-CM | POA: Insufficient documentation

## 2022-12-16 MED ORDER — MAGNESIUM OXIDE -MG SUPPLEMENT 200 MG PO TABS: 200.0000 mg | ORAL_TABLET | Freq: Every day | ORAL | 1 refills | Status: DC

## 2022-12-16 NOTE — Patient Instructions (Addendum)
Medication Instructions:  Your physician has recommended you make the following change in your medication:  Please start Magnesium Oxide 200 mg daily   Labwork: In 2-3 weeks at [facility]   Testing/Procedures: None   Follow-Up: Your physician recommends that you schedule a follow-up appointment in:  as scheduled  Nurse visit in 2-3 weeks for BP check and monitor calibrations   Any Other Special Instructions Will Be Listed Below (If Applicable).  If you need a refill on your cardiac medications before your next appointment, please call your pharmacy.

## 2022-12-16 NOTE — Progress Notes (Unsigned)
Cardiology Office Note:  .   Date:  12/16/2022 ID:  [name], DOB July 26, 1945, MRN [id number] PCP: Billie Lade, MD  [facility] Providers Cardiologist:  Nona Dell, MD Electrophysiologist:  Lewayne Bunting, MD    History of Present Illness: Amy [name]   Amy [name] is a 77 y.o. female with a PMH of HFpEF, CAD, mild to moderate AS, uncontrolled HTN, PAF, sinus node dysfunction, s/p PPM, and splenomegaly, who presents today for 1 month follow-up. Followed by EP.   Last seen by Perlie Gold, PA-C on 11/18/2022 for uncontrolled HTN evaluation despite adjusting/increasing her antihypertensive medication regimen. Pt reported systolic readings frequently exceeding over 200. Pt reported symptoms of blurry vision, nausea, noted at night, along with chronic indigestion and also noted epigastric discomfort. Reported occasional episodes of vomiting and diarrhea, predominately occurring at night, reported seeing PCP for management of this. Lisinopril d/c and started on Valsartan 160 mg daily, amlodipine 5 mg added at night. Renal artery duplex ordered, was referred to HTN clinic for further management.   Today she presents for 1 month follow-up. She states she is doing well. Shows me her BP log from home, shows an SBP 150's - 170's at home on average. Owns an automatic arm cuff. Denies any chest pain, shortness of breath, palpitations, syncope, presyncope, dizziness, orthopnea, PND, swelling or significant weight changes, acute bleeding, or claudication.  ROS: Negative. See HPI.   Studies Reviewed: .    Renal artery duplex 11/28/2022:  Summary:  Largest Aortic Diameter: 2.2 cm    Renal:    Right: Normal size right kidney. Abnormal right Resistive Index.         Normal cortical thickness of right kidney. Evidence of a >        60% stenosis of the right renal artery. RRV flow present.  Left:  Normal size of left kidney. Abnormal left Resisitve Index.         Normal cortical thickness of  the left kidney. Evidence of a >        60% stenosis in the left renal artery. LRV flow present.  Mesenteric:  Normal Celiac artery and Superior Mesenteric artery findings.    *See table(s) above for measurements and observations.    Suggest Peripheral Vascular Consult.    Diagnosing physician: Nanetta Batty MD  Echocardiogram 11/2021: 1. Left ventricular ejection fraction, by estimation, is 65 to 70%. The  left ventricle has normal function. The left ventricle has no regional  wall motion abnormalities. Left ventricular diastolic parameters are  consistent with Grade I diastolic  dysfunction (impaired relaxation). Elevated left ventricular end-diastolic  pressure.   2. Right ventricular systolic function is normal. The right ventricular  size is normal. There is mildly elevated pulmonary artery systolic  pressure. The estimated right ventricular systolic pressure is 40.2 mmHg.   3. Left atrial size was severely dilated.   4. The mitral valve is abnormal. Mild mitral valve regurgitation.   5. Tricuspid valve regurgitation is moderate.   6. The aortic valve is tricuspid. Aortic valve regurgitation is trivial.  Mild to moderate aortic valve stenosis. Aortic valve mean gradient  measures 8.0 mmHg. Dimentionless index 0.63.   7. The inferior vena cava is normal in size with greater than 50%  respiratory variability, suggesting right atrial pressure of 3 mmHg.   Comparison(s): Prior images reviewed side by side. LVEF vigorous at  65-70%. Mild diastolic dysfunction with increased filling pressures.  Mildly increased RVSP. Mild to moderate aortic stenosis  without  paradoxically low gradient.  Lexiscan 11/2021:   Findings are equivocal. The study is low risk.   No ST deviation was noted. The ECG was negative for ischemia.   LV perfusion is abnormal.  Very small, mild intensity, apical anteroseptal defect that is reversible in the setting of breast attenuation artifact.  Cannot exclude  a minor ischemic territory.   Left ventricular function is normal. Nuclear stress EF: 69 %. End diastolic cavity size is normal.   Low risk study.  There is a very small region of ischemia at the anteroseptal apex noted in the setting of breast attenuation artifact.  LVEF normal at 69%.  Carotid duplex 01/2021: Summary:  Right Carotid: Velocities in the right ICA are consistent with a 1-39%  stenosis.   Left Carotid: Velocities in the left ICA are consistent with a 1-39%  stenosis.   Vertebrals: Bilateral vertebral arteries demonstrate antegrade flow.  Subclavians: Normal flow hemodynamics were seen in bilateral subclavian arteries.   *See table(s) above for measurements and observations.  Holter monitor 07/2016: Incomplete 48 hour Holter monitor reviewed. Sinus rhythm was present throughout. Rare PACs were noted, less than 1% total beats, atrial runs observed as noted below. Rare PVCs were noted, less than 1% total beats. Recurrent pauses were seen as follows:   2-3 second pauses noted on 6/1 at 6:54 AM and recurring at 7:05 AM with slowest recorded heart rate in the 20s transiently. 4.16 second pause with junctional escape beat noted on 6/1 at 7:03 AM. 30 beat run of PAT noted on 6/1 7:04 AM. 2.5 seconds pause noted on 6/2 at 2:13 AM and 2:18 AM.   Findings are concerning for sinus node dysfunction with element of tachycardia-bradycardia syndrome.  Physical Exam:   VS:  BP 112/70   Pulse 66   Ht 4\' 10"  (1.473 m)   Wt 144 lb 3.2 oz (65.4 kg)   SpO2 98%   BMI 30.14 kg/m    Wt Readings from Last 3 Encounters:  12/17/22 145 lb (65.8 kg)  12/16/22 144 lb 3.2 oz (65.4 kg)  12/13/22 143 lb (64.9 kg)    GEN: Well nourished, well developed in no acute distress NECK: No JVD; No carotid bruits CARDIAC: S1/S2, RRR, no murmurs, rubs, gallops RESPIRATORY:  Clear to auscultation without rales, wheezing or rhonchi  ABDOMEN: Soft, non-tender, non-distended EXTREMITIES:  No edema; No  deformity   ASSESSMENT AND PLAN: .    Resistant HTN SBP at home averaging 150s to 170s, BP in office was well-controlled today.  Manual BP on recheck was 120/71.  Renal artery duplex revealed evidence of greater than 60% stenosis of bilateral renal arteries that is contributing to her uncontrolled hypertension.  Will bring patient back in office in 1 to 2 weeks for nurse visit to check BP cuff for accuracy.  No medication changes at this time.  Follow-up with Dr. Allyson Sabal as scheduled. Heart healthy diet and regular cardiovascular exercise encouraged. Discussed to monitor BP at home at least 2 hours after medications and sitting for 5-10 minutes. Given BP log.   CAD She is status post drug-eluting stent x 2 to LAD/diagonal bifurcation in 2015.  Stable with no anginal symptoms. No indication for ischemic evaluation.  Continue aspirin, atorvastatin, Plavix, Imdur, and valsartan. Heart healthy diet and regular cardiovascular exercise encouraged.   PAD Renal artery stenosis noted on renal artery duplex testing as noted above.  Carotid duplex test from 2022 revealed mild bilateral ICA stenosis at 1 to 39%.  Denies any symptoms.  Follow-up with Dr. Allyson Sabal as scheduled.  No medication changes at this time.  HFpEF Stage C, NYHA class I symptoms.  Echocardiogram 11/2021 revealed EF 65 to 70%, grade 1 DD. Euvolemic and well compensated on exam.  Continue current medication regimen.  Will bring patient back in 1 to 2 weeks for nurse visit to check BP cuff for accuracy.  If BP cuff is found to be accurate, plan to switch valsartan to Entresto at future office visit. Low sodium diet, fluid restriction <2L, and daily weights encouraged. Educated to contact our office for weight gain of 2 lbs overnight or 5 lbs in one week.  Aortic stenosis Echocardiogram in October 2023 revealed mild to moderate aortic valve stenosis with mean gradient 8.0 mmHg.  She denies any symptoms at this time.  Plan to update echocardiogram at  next office visit for monitoring.  No medication changes at this time.  Care and ED precautions discussed.  PAF; SSS, s/p PPM Denies any palpitations or tachycardia.  Heart rate is well-controlled today.  Last remote device check revealed normal device function.  Continue current medication regimen.  Continue follow-up with Dr. Ladona Ridgel as scheduled.  Splenomegaly Denies any red flag symptoms or concerning signs or symptoms.  She is being followed closely by heme-onc.  Follow-up with Dr. Ellin Saba as scheduled.  Care and ED precautions discussed.  8. Low magnesium level Most recent labs revealed magnesium level 1.6.  Will begin magnesium oxide 200 mg daily and repeat magnesium level in 1 week. Continue to follow with PCP.   Dispo: Follow-up with me/APP in 1 month or sooner if anything changes.  Signed, Sharlene Dory, NP

## 2022-12-17 ENCOUNTER — Encounter: Payer: Self-pay | Admitting: Cardiovascular Disease

## 2022-12-17 ENCOUNTER — Ambulatory Visit: Payer: Medicare Other | Attending: Cardiovascular Disease | Admitting: Cardiovascular Disease

## 2022-12-17 VITALS — BP 154/70 | HR 81 | Ht 59.0 in | Wt 145.0 lb

## 2022-12-17 DIAGNOSIS — I1A0 Resistant hypertension: Secondary | ICD-10-CM | POA: Diagnosis not present

## 2022-12-17 DIAGNOSIS — I779 Disorder of arteries and arterioles, unspecified: Secondary | ICD-10-CM | POA: Insufficient documentation

## 2022-12-17 DIAGNOSIS — I6523 Occlusion and stenosis of bilateral carotid arteries: Secondary | ICD-10-CM | POA: Diagnosis not present

## 2022-12-17 DIAGNOSIS — I701 Atherosclerosis of renal artery: Secondary | ICD-10-CM | POA: Insufficient documentation

## 2022-12-17 MED ORDER — AMLODIPINE BESYLATE 10 MG PO TABS: 10.0000 mg | ORAL_TABLET | Freq: Every evening | ORAL | 3 refills | Status: DC

## 2022-12-17 NOTE — Assessment & Plan Note (Signed)
Carotid Dopplers performed 08/20/2019 showed moderate bilateral ICA stenosis.  She does have bruits bilaterally.  I am going to repeat her carotid Doppler studies.

## 2022-12-17 NOTE — Progress Notes (Signed)
12/17/2022 Amy [name]   July 29, 1945  [id number]  Primary Physician Amy Lade, MD Primary Cardiologist: Amy Gess MD Amy [name], [place]  HPI:  Amy [name] is a 77 y.o. thin-appearing widowed Caucasian female mother of 1 daughter with no grandchildren who was referred by Amy Gold, PA-C and Dr. Diona [name] for evaluation of potential renal vascular hypertension.  She is accompanied by her daughter Amy [name] today.  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  Risk factors include treated hypertension and hyperlipidemia.  Mother did have CABG.  She has never had a heart attack or stroke.  She does complain of some dyspnea.  She did have COVID 2 weeks ago.  She had hypertension for years which has become more resistant over the last 4 to 6 weeks.  This, needed in an admission to the hospital on 11 1.  She also has moderate aortic stenosis with normal LV systolic function.  She had renal Doppler studies performed 11/28/2022 that revealed potentially bilateral renal artery stenosis with a right renal aortic ratio of 4.5 and a left of 3.9.   Current Meds  Medication Sig   aspirin 81 MG tablet Take 81 mg by mouth daily.   atorvastatin (LIPITOR) 20 MG tablet Take 20 mg by mouth at bedtime.    betamethasone valerate ointment (VALISONE) 0.1 % APPLY TO THE AFFECTED AREA(S) TWICE DAILY   calcium carbonate (OSCAL) 1500 (600 Ca) MG TABS tablet Take 1,500 mg by mouth daily with breakfast.   Cholecalciferol (VITAMIN D3) 2000 units capsule Take 2,000 Units by mouth daily at 12 noon.    clobetasol (TEMOVATE) 0.05 % external solution Apply 1 application topically daily as needed (psoriasis).    clopidogrel (PLAVIX) 75 MG tablet TAKE 1 TABLET BY MOUTH DAILY   hydrochlorothiazide (HYDRODIURIL) 12.5 MG tablet Take 12.5 mg by mouth daily.   isosorbide mononitrate (IMDUR) 60 MG 24 hr tablet Take 1 tablet (60 mg total) by mouth daily.   levothyroxine (SYNTHROID) 75 MCG tablet Take 75 mcg by  mouth every morning.   Magnesium Oxide -Mg Supplement 200 MG TABS Take 1 tablet (200 mg total) by mouth daily.   Multiple Vitamin (MULTIVITAMIN WITH MINERALS) TABS tablet Take 1 tablet by mouth daily at 12 noon.    pantoprazole (PROTONIX) 40 MG tablet Take 1 tablet (40 mg total) by mouth daily.   valsartan (DIOVAN) 160 MG tablet Take 1 tablet (160 mg total) by mouth daily.   [DISCONTINUED] amLODipine (NORVASC) 5 MG tablet Take 1 tablet (5 mg total) by mouth every evening.     Allergies  Allergen Reactions   Sulfa Antibiotics Rash    Social History   Socioeconomic History   Marital status: Widowed    Spouse name: Not on file   Number of children: Not on file   Years of education: Not on file   Highest education level: Not on file  Occupational History   Not on file  Tobacco Use   Smoking status: Never   Smokeless tobacco: Never  Vaping Use   Vaping status: Never Used  Substance and Sexual Activity   Alcohol use: No    Alcohol/week: 0.0 standard drinks of alcohol   Drug use: No   Sexual activity: Never  Other Topics Concern   Not on file  Social History Narrative   Not on file   Social Determinants of Health   Financial Resource Strain: Low Risk  (08/19/2022)   Overall Physicist, medical Strain (  CARDIA)    Difficulty of Paying Living Expenses: Not hard at all  Food Insecurity: No Food Insecurity (08/19/2022)   Hunger Vital Sign    Worried About Running Out of Food in the Last Year: Never true    Ran Out of Food in the Last Year: Never true  Transportation Needs: No Transportation Needs (08/19/2022)   PRAPARE - Administrator, Civil Service (Medical): No    Lack of Transportation (Non-Medical): No  Physical Activity: Sufficiently Active (08/19/2022)   Exercise Vital Sign    Days of Exercise per Week: 7 days    Minutes of Exercise per Session: 60 min  Stress: No Stress Concern Present (08/19/2022)   [name] of Occupational Health - Occupational  Stress Questionnaire    Feeling of Stress : Not at all  Social Connections: Socially Integrated (08/19/2022)   Social Connection and Isolation Panel [NHANES]    Frequency of Communication with Friends and Family: More than three times a week    Frequency of Social Gatherings with Friends and Family: More than three times a week    Attends Religious Services: More than 4 times per year    Active Member of [employer] or Organizations: Yes    Attends Engineer, structural: More than 4 times per year    Marital Status: Married  Catering manager Violence: Not At Risk (08/19/2022)   Humiliation, Afraid, Rape, and Kick questionnaire    Fear of Current or Ex-Partner: No    Emotionally Abused: No    Physically Abused: No    Sexually Abused: No     Review of Systems: General: negative for chills, fever, night sweats or weight changes.  Cardiovascular: negative for chest pain, dyspnea on exertion, edema, orthopnea, palpitations, paroxysmal nocturnal dyspnea or shortness of breath Dermatological: negative for rash Respiratory: negative for cough or wheezing Urologic: negative for hematuria Abdominal: negative for nausea, vomiting, diarrhea, bright red blood per rectum, melena, or hematemesis Neurologic: negative for visual changes, syncope, or dizziness All other systems reviewed and are otherwise negative except as noted above.    Blood pressure (!) 154/70, pulse 81, height 4\' 11"  (1.499 m), weight 145 lb (65.8 kg), SpO2 94%.  General appearance: alert and no distress Neck: no adenopathy, no JVD, supple, symmetrical, trachea midline, thyroid not enlarged, symmetric, no tenderness/mass/nodules, and bilateral carotid bruits Lungs: clear to auscultation bilaterally Heart: regular rate and rhythm, S1, S2 normal, no murmur, click, rub or gallop Extremities: extremities normal, atraumatic, no cyanosis or edema Pulses: 2+ and symmetric Skin: Skin color, texture, turgor normal. No rashes or  lesions Neurologic: Grossly normal  EKG not performed today      ASSESSMENT AND PLAN:   Resistant hypertension Ms. [name] was referred by Amy Gold, PA-C for resistant hypertension and potential renal artery stenosis.  She has had hypertension for years but it has been more difficult to control in the last 4 to 6 weeks.  She was recently admitted with hypertension 12/06/2022.  Renal Doppler studies performed 11/28/2022 revealed potential bilateral renal artery stenosis with a right renal aortic ratio of 4.5 and a left of 3.9.  Her blood pressure today in the office is 154/70.  I did review her blood pressure log which has shown elevated blood pressure readings at home.  She is on amlodipine 5 mg, hydrochlorothiazide, and valsartan 160 mg.  I am going to increase her amlodipine 10 mg will have her keep a blood pressure log over the next 30 days.  I am going to obtain an abdominal CTA to confirm renal artery stenosis.  If indeed she does have this and her blood pressures remain elevated consider an invasive approach with renal artery intervention.  Carotid artery disease (HCC) Carotid Dopplers performed 08/20/2019 showed moderate bilateral ICA stenosis.  She does have bruits bilaterally.  I am going to repeat her carotid Doppler studies.     Amy Gess MD FACP,FACC,FAHA, [facility] 12/17/2022 9:21 AM

## 2022-12-17 NOTE — Patient Instructions (Addendum)
Medication Instructions:  Your physician has recommended you make the following change in your medication:   -Increase amlodipine (norvasc) 10mg  once daily.   *If you need a refill on your cardiac medications before your next appointment, please call your pharmacy*    Testing/Procedures: Non-Cardiac CT Angiography (CTA) abdomen/pelvis, is a special type of CT scan that uses a computer to produce multi-dimensional views of major blood vessels throughout the body. In CT angiography, a contrast material is injected through an IV to help visualize the blood vessels   Your physician has requested that you have a carotid duplex. This test is an ultrasound of the carotid arteries in your neck. It looks at blood flow through these arteries that supply the brain with blood. Allow one hour for this exam. There are no restrictions or special instructions. This will take place at [address], [address].  Please note: We ask at that you not bring children with you during ultrasound (echo/ vascular) testing. Due to room size and safety concerns, children are not allowed in the ultrasound rooms during exams. Our front office staff cannot provide observation of children in our lobby area while testing is being conducted. An adult accompanying a patient to their appointment will only be allowed in the ultrasound room at the discretion of the ultrasound technician under special circumstances. We apologize for any inconvenience.    Follow-Up: At [facility], you and your health needs are our priority.  As part of our continuing mission to provide you with exceptional heart care, we have created designated Provider Care Teams.  These Care Teams include your primary Cardiologist (physician) and Advanced Practice Providers (APPs -  Physician Assistants and Nurse Practitioners) who all work together to provide you with the care you need, when you need it.  We recommend signing up for the patient  portal called "MyChart".  Sign up information is provided on this After Visit Summary.  MyChart is used to connect with patients for Virtual Visits (Telemedicine).  Patients are able to view lab/test results, encounter notes, upcoming appointments, etc.  Non-urgent messages can be sent to your provider as well.   To learn more about what you can do with MyChart, go to [email or web address].    Your next appointment:   6 week(s)  Provider:   Nanetta Batty, MD   Other Instructions Please continue to keep a blood pressure log, take your blood pressure in the morning and in the evening.  HOW TO TAKE YOUR BLOOD PRESSURE: Rest 5 minutes before taking your blood pressure. Don't smoke or drink caffeinated beverages for at least 30 minutes before. Take your blood pressure before (not after) you eat. Sit comfortably with your back supported and both feet on the floor (don't cross your legs). Elevate your arm to heart level on a table or a desk. Use the proper sized cuff. It should fit smoothly and snugly around your bare upper arm. There should be enough room to slip a fingertip under the cuff. The bottom edge of the cuff should be 1 inch above the crease of the elbow. Ideally, take 3 measurements at one sitting and record the average.

## 2022-12-17 NOTE — Assessment & Plan Note (Signed)
Amy [name] was referred by Perlie Gold, PA-C for resistant hypertension and potential renal artery stenosis.  She has had hypertension for years but it has been more difficult to control in the last 4 to 6 weeks.  She was recently admitted with hypertension 12/06/2022.  Renal Doppler studies performed 11/28/2022 revealed potential bilateral renal artery stenosis with a right renal aortic ratio of 4.5 and a left of 3.9.  Her blood pressure today in the office is 154/70.  I did review her blood pressure log which has shown elevated blood pressure readings at home.  She is on amlodipine 5 mg, hydrochlorothiazide, and valsartan 160 mg.  I am going to increase her amlodipine 10 mg will have her keep a blood pressure log over the next 30 days.  I am going to obtain an abdominal CTA to confirm renal artery stenosis.  If indeed she does have this and her blood pressures remain elevated consider an invasive approach with renal artery intervention.

## 2022-12-20 ENCOUNTER — Telehealth: Payer: Self-pay | Admitting: Cardiology

## 2022-12-20 NOTE — Telephone Encounter (Signed)
Reports checking BP at same times each day. Reports her amlodipine was increased to 10 mg by Dr. Allyson Sabal and says she will start this tonight. BP this morning was 172/74 and yesterday morning 135/63. Reports she is worried her BP may drop too low when she start increase dose of amlodipine. Advised that she should follow the plan for increasing amlodipine as Dr. Allyson Sabal suggested. Advised to continue monitoring her BP daily and also monitor for s/s of low blood pressure, (dizziness, lightheadedness). Advised if this occurs, she should check her BP at that time and also contact our office. Advised to keep nurse visit scheduled on 12/30/2022 and reminded to bring BP log and home BP monitor to this visit. Verbalized understanding of plan.

## 2022-12-20 NOTE — Telephone Encounter (Signed)
Pt c/o BP issue: STAT if pt c/o blurred vision, one-sided weakness or slurred speech  1. What are your last 5 BP readings? 172/74 135/55 - yesterday morning   2. Are you having any other symptoms (ex. Dizziness, headache, blurred vision, passed out)? No   3. What is your BP issue? Pt is requesting call back to discuss bp that seems to be dropping in the morning time.

## 2022-12-25 ENCOUNTER — Ambulatory Visit (HOSPITAL_COMMUNITY)
Admission: RE | Admit: 2022-12-25 | Discharge: 2022-12-25 | Disposition: A | Discharge status: Home / Self Care | Payer: Medicare Other | Source: Ambulatory Visit | Attending: Cardiovascular Disease | Admitting: Cardiovascular Disease

## 2022-12-25 DIAGNOSIS — I6523 Occlusion and stenosis of bilateral carotid arteries: Secondary | ICD-10-CM | POA: Insufficient documentation

## 2022-12-26 ENCOUNTER — Telehealth: Payer: Self-pay | Admitting: *Deleted

## 2022-12-26 ENCOUNTER — Other Ambulatory Visit (HOSPITAL_COMMUNITY): Payer: Self-pay | Admitting: *Deleted

## 2022-12-26 DIAGNOSIS — I6523 Occlusion and stenosis of bilateral carotid arteries: Secondary | ICD-10-CM

## 2022-12-26 DIAGNOSIS — I739 Peripheral vascular disease, unspecified: Secondary | ICD-10-CM

## 2022-12-26 NOTE — Telephone Encounter (Signed)
Called left message for patient to call back for results  Unable to release to [facility]

## 2022-12-26 NOTE — Telephone Encounter (Signed)
The patient has been notified of the result and verbalized understanding.  All questions (if any) were answered. Tobin Chad, RN 12/26/2022 4:49 PM

## 2022-12-26 NOTE — Telephone Encounter (Signed)
-----   Message from Nanetta Batty sent at 12/25/2022  4:32 PM EST ----- No evidence of significant stenosis.  Repeat when clinically indicated.

## 2022-12-26 NOTE — Telephone Encounter (Signed)
Patient was returning call. Please advise ?

## 2022-12-27 ENCOUNTER — Ambulatory Visit (HOSPITAL_BASED_OUTPATIENT_CLINIC_OR_DEPARTMENT_OTHER)
Admission: RE | Admit: 2022-12-27 | Discharge: 2022-12-27 | Disposition: A | Discharge status: Home / Self Care | Payer: Medicare Other | Source: Ambulatory Visit | Attending: Cardiovascular Disease | Admitting: Cardiovascular Disease

## 2022-12-27 DIAGNOSIS — R790 Abnormal level of blood mineral: Secondary | ICD-10-CM | POA: Diagnosis not present

## 2022-12-27 DIAGNOSIS — I1A0 Resistant hypertension: Secondary | ICD-10-CM | POA: Insufficient documentation

## 2022-12-27 DIAGNOSIS — I700 Atherosclerosis of aorta: Secondary | ICD-10-CM | POA: Diagnosis not present

## 2022-12-27 DIAGNOSIS — Z90710 Acquired absence of both cervix and uterus: Secondary | ICD-10-CM | POA: Diagnosis not present

## 2022-12-27 DIAGNOSIS — I701 Atherosclerosis of renal artery: Secondary | ICD-10-CM | POA: Insufficient documentation

## 2022-12-27 MED ORDER — IOHEXOL 350 MG/ML SOLN
100.0000 mL | Freq: Once | INTRAVENOUS | Status: AC | PRN
  Administered 2022-12-27: 100 mL via INTRAVENOUS

## 2022-12-28 LAB — MAGNESIUM: Magnesium: 1.7 mg/dL (ref 1.6–2.3)

## 2022-12-30 ENCOUNTER — Ambulatory Visit: Payer: Medicare Other | Attending: Cardiology | Admitting: *Deleted

## 2022-12-30 ENCOUNTER — Encounter: Payer: Self-pay | Admitting: *Deleted

## 2022-12-30 VITALS — BP 134/60 | HR 68 | Ht 59.0 in | Wt 142.6 lb

## 2022-12-30 DIAGNOSIS — I1A0 Resistant hypertension: Secondary | ICD-10-CM

## 2022-12-30 NOTE — Progress Notes (Addendum)
Home BP's reviewed and averaging 140's - 150's, improved since last office visit after she saw Dr. Allyson Sabal and he adjusted her medication. Her history of renal artery stenosis noted on renal artery duplex is contributing to her elevated BP. Office SBP is at goal and her home monitor shows SBP is almost 10 points off from office SBP. Recommend no medication changes at this time. Recommend she continue to monitor and log her BP until seen by Dr. Allyson Sabal next month. She has been scheduled for CT of her abdomen for further workup regarding renal artery stenosis.   Sharlene Dory, NP

## 2022-12-30 NOTE — Patient Instructions (Addendum)
Your physician recommends that you continue on your current medications as directed. Please refer to the Current Medication list given to you today.  Recommend you get a new home BP monitor  We will contact you with any changes

## 2022-12-30 NOTE — Progress Notes (Signed)
Presents for nurse visit per last OV with [facility] on 12/16/2022 for BP check. Brought home BP readings and monitor. Denies dizziness, chest pain or SOB. Medications reviewed by list brought to office. Vitals taken and routed to provider for review. Home BP readings placed on provider's desk.   Office Manual BP 134/60 & HR 68 Home Monitor BP 142/79 & HR 65

## 2023-01-06 ENCOUNTER — Other Ambulatory Visit: Payer: Self-pay | Admitting: Internal Medicine

## 2023-01-06 DIAGNOSIS — I10 Essential (primary) hypertension: Secondary | ICD-10-CM

## 2023-01-20 DIAGNOSIS — Z961 Presence of intraocular lens: Secondary | ICD-10-CM | POA: Diagnosis not present

## 2023-01-20 DIAGNOSIS — H43812 Vitreous degeneration, left eye: Secondary | ICD-10-CM | POA: Diagnosis not present

## 2023-01-20 DIAGNOSIS — H01001 Unspecified blepharitis right upper eyelid: Secondary | ICD-10-CM | POA: Diagnosis not present

## 2023-01-20 DIAGNOSIS — H04123 Dry eye syndrome of bilateral lacrimal glands: Secondary | ICD-10-CM | POA: Diagnosis not present

## 2023-01-27 ENCOUNTER — Ambulatory Visit: Payer: Medicare Other | Attending: Cardiovascular Disease | Admitting: Cardiovascular Disease

## 2023-01-27 ENCOUNTER — Encounter: Payer: Self-pay | Admitting: Cardiovascular Disease

## 2023-01-27 VITALS — BP 160/62 | HR 74 | Ht 59.0 in | Wt 146.6 lb

## 2023-01-27 DIAGNOSIS — E785 Hyperlipidemia, unspecified: Secondary | ICD-10-CM | POA: Diagnosis not present

## 2023-01-27 DIAGNOSIS — I495 Sick sinus syndrome: Secondary | ICD-10-CM | POA: Insufficient documentation

## 2023-01-27 DIAGNOSIS — I2510 Atherosclerotic heart disease of native coronary artery without angina pectoris: Secondary | ICD-10-CM | POA: Insufficient documentation

## 2023-01-27 DIAGNOSIS — I10 Essential (primary) hypertension: Secondary | ICD-10-CM | POA: Insufficient documentation

## 2023-01-27 DIAGNOSIS — R0609 Other forms of dyspnea: Secondary | ICD-10-CM | POA: Insufficient documentation

## 2023-01-27 DIAGNOSIS — I350 Nonrheumatic aortic (valve) stenosis: Secondary | ICD-10-CM | POA: Diagnosis not present

## 2023-01-27 NOTE — Assessment & Plan Note (Signed)
2D echocardiogram performed 11/20/2021 revealed normal LV systolic function, grade 1 diastolic dysfunction with probably moderate aortic stenosis.  Aortic valve area measured 1.05 cm with a peak gradient of 15 mmHg.  She does complain of progressive dyspnea on exertion.  I am going to get a repeat 2D echo and cardiac PET study to an ischemic contribution and/or progression of Amy [name] or aortic stenosis.

## 2023-01-27 NOTE — Assessment & Plan Note (Signed)
Blood pressure today is 160/72.  I did review her blood pressure log which reveals blood pressures in the 130-150/60 range.  She is on amlodipine, hydrochlorothiazide and valsartan.  Her most recent renal Doppler studies performed 11/24/2022 revealed renal aortic ratio of 4.5 on the right and 3.9 on the left and a recent CTA performed 01/01/2023 did show severe bilateral renal artery stenosis with ostial calcific plaque.  Her blood pressure is under control on 3 medications but she also may be a candidate for renal artery stenting.  Renal function is normal.

## 2023-01-27 NOTE — Progress Notes (Signed)
01/27/2023 Amy [name]   11-27-1945  [id number]  Primary Physician Billie Lade, MD Primary Cardiologist: Runell Gess MD Nicholes Calamity, [place]  HPI:  Amy [name] is a 77 y.o.   thin-appearing widowed Caucasian female mother of 1 daughter with no grandchildren who was referred by Amy Gold, PA-C and Dr. Diona Browner for evaluation of potential renal vascular hypertension.  She is accompanied by her daughter April today.  I last saw her in the office 12/17/2022.  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  Risk factors include treated hypertension and hyperlipidemia.  Mother did have CABG.  She has never had a heart attack or stroke.  She does complain of some dyspnea.  She did have COVID 2 weeks ago.  She had hypertension for years which has become more resistant over the last 4 to 6 weeks.  This, needed in an admission to the hospital on 11 1.  She also has moderate aortic stenosis with normal LV systolic function.  She had renal Doppler studies performed 11/28/2022 that revealed potentially bilateral renal artery stenosis with a right renal aortic ratio of 4.5 and a left of 3.9.  Since I saw her a month and a half ago I had reviewed her blood pressure log which shows her blood pressure under fairly good control with pressures in the 1 30-1 50 range over 60-70.  She is on amlodipine, hydrochlorothiazide and valsartan.  Her abdominal CTA did reveal severe bilateral renal artery stenosis.  She has been complaining of progressive dyspnea on exertion.  She does have a history of moderate aortic stenosis by 2D echo performed 11/20/2021.  At the same time her Myoview stress test was low risk and nonischemic ischemic.  She does have a history of CAD status post LAD and diagonal branch stenting with Promus drug-eluting stents at [facility] 12/02/2013 as well as Allyson Sabal transvenous pacemaker insertion by Dr. Ladona Ridgel 08/01/2016 for sinus node dysfunction.     Current Meds  Medication Sig    amLODipine (NORVASC) 10 MG tablet Take 1 tablet (10 mg total) by mouth every evening.   aspirin 81 MG tablet Take 81 mg by mouth daily.   atorvastatin (LIPITOR) 20 MG tablet Take 20 mg by mouth at bedtime.    betamethasone valerate ointment (VALISONE) 0.1 % APPLY TO THE AFFECTED AREA(S) TWICE DAILY   calcium carbonate (OSCAL) 1500 (600 Ca) MG TABS tablet Take 1,500 mg by mouth daily with breakfast.   Cholecalciferol (VITAMIN D3) 2000 units capsule Take 2,000 Units by mouth daily at 12 noon.    clobetasol (TEMOVATE) 0.05 % external solution Apply 1 application topically daily as needed (psoriasis).    clopidogrel (PLAVIX) 75 MG tablet TAKE 1 TABLET BY MOUTH DAILY   hydrochlorothiazide (HYDRODIURIL) 12.5 MG tablet Take 12.5 mg by mouth daily.   isosorbide mononitrate (IMDUR) 60 MG 24 hr tablet TAKE 1 TABLET BY MOUTH DAILY   levothyroxine (SYNTHROID) 75 MCG tablet Take 75 mcg by mouth every morning.   magnesium oxide (MAG-OX) 400 (240 Mg) MG tablet Take 0.5 tablets by mouth daily.   Multiple Vitamin (MULTIVITAMIN WITH MINERALS) TABS tablet Take 1 tablet by mouth daily at 12 noon.    pantoprazole (PROTONIX) 40 MG tablet Take 1 tablet (40 mg total) by mouth daily.   valsartan (DIOVAN) 160 MG tablet Take 1 tablet (160 mg total) by mouth daily.     Allergies  Allergen Reactions   Sulfa Antibiotics Rash  Social History   Socioeconomic History   Marital status: Widowed    Spouse name: Not on file   Number of children: Not on file   Years of education: Not on file   Highest education level: Not on file  Occupational History   Not on file  Tobacco Use   Smoking status: Never   Smokeless tobacco: Never  Vaping Use   Vaping status: Never Used  Substance and Sexual Activity   Alcohol use: No    Alcohol/week: 0.0 standard drinks of alcohol   Drug use: No   Sexual activity: Never  Other Topics Concern   Not on file  Social History Narrative   Not on file   Social Drivers of Health    Financial Resource Strain: Low Risk  (08/19/2022)   Overall Financial Resource Strain (CARDIA)    Difficulty of Paying Living Expenses: Not hard at all  Food Insecurity: No Food Insecurity (08/19/2022)   Hunger Vital Sign    Worried About Running Out of Food in the Last Year: Never true    Ran Out of Food in the Last Year: Never true  Transportation Needs: No Transportation Needs (08/19/2022)   PRAPARE - Administrator, Civil Service (Medical): No    Lack of Transportation (Non-Medical): No  Physical Activity: Sufficiently Active (08/19/2022)   Exercise Vital Sign    Days of Exercise per Week: 7 days    Minutes of Exercise per Session: 60 min  Stress: No Stress Concern Present (08/19/2022)   [name] of Occupational Health - Occupational Stress Questionnaire    Feeling of Stress : Not at all  Social Connections: Socially Integrated (08/19/2022)   Social Connection and Isolation Panel [NHANES]    Frequency of Communication with Friends and Family: More than three times a week    Frequency of Social Gatherings with Friends and Family: More than three times a week    Attends Religious Services: More than 4 times per year    Active Member of [employer] or Organizations: Yes    Attends Engineer, structural: More than 4 times per year    Marital Status: Married  Catering manager Violence: Not At Risk (08/19/2022)   Humiliation, Afraid, Rape, and Kick questionnaire    Fear of Current or Ex-Partner: No    Emotionally Abused: No    Physically Abused: No    Sexually Abused: No     Review of Systems: General: negative for chills, fever, night sweats or weight changes.  Cardiovascular: negative for chest pain, dyspnea on exertion, edema, orthopnea, palpitations, paroxysmal nocturnal dyspnea or shortness of breath Dermatological: negative for rash Respiratory: negative for cough or wheezing Urologic: negative for hematuria Abdominal: negative for nausea, vomiting,  diarrhea, bright red blood per rectum, melena, or hematemesis Neurologic: negative for visual changes, syncope, or dizziness All other systems reviewed and are otherwise negative except as noted above.    Blood pressure (!) 160/62, pulse 74, height 4\' 11"  (1.499 m), weight 146 lb 9.6 oz (66.5 kg), SpO2 97%.  General appearance: alert and no distress Neck: no adenopathy, no JVD, supple, symmetrical, trachea midline, thyroid not enlarged, symmetric, no tenderness/mass/nodules, and soft bilateral carotid bruits Lungs: clear to auscultation bilaterally Heart: regular rate and rhythm, S1, S2 normal, no murmur, click, rub or gallop Extremities: extremities normal, atraumatic, no cyanosis or edema Pulses: 2+ and symmetric Skin: Skin color, texture, turgor normal. No rashes or lesions Neurologic: Grossly normal  EKG not performed today  ASSESSMENT AND PLAN:   Sinus node dysfunction (HCC) History of sinus node function status post permanent transvenous pacemaker implantation by Dr. Ladona Ridgel 07/30/2016.  Coronary artery disease History of CAD status post LAD diagonal branch stenting at [facility] 12/02/2013.  She did have a Myoview stress test performed 11/20/2021 which was low risk.  Essential hypertension Blood pressure today is 160/72.  I did review her blood pressure log which reveals blood pressures in the 130-150/60 range.  She is on amlodipine, hydrochlorothiazide and valsartan.  Her most recent renal Doppler studies performed 11/24/2022 revealed renal aortic ratio of 4.5 on the right and 3.9 on the left and a recent CTA performed 01/01/2023 did show severe bilateral renal artery stenosis with ostial calcific plaque.  Her blood pressure is under control on 3 medications but she also may be a candidate for renal artery stenting.  Renal function is normal.  Aortic stenosis 2D echocardiogram performed 11/20/2021 revealed normal LV systolic function, grade 1 diastolic dysfunction with  probably moderate aortic stenosis.  Aortic valve area measured 1.05 cm with a peak gradient of 15 mmHg.  She does complain of progressive dyspnea on exertion.  I am going to get a repeat 2D echo and cardiac PET study to an ischemic contribution and/or progression of Arshi Duarte or aortic stenosis.     Runell Gess MD FACP,FACC,FAHA, [facility] 01/27/2023 2:20 PM

## 2023-01-27 NOTE — Assessment & Plan Note (Signed)
History of CAD status post LAD diagonal branch stenting at [facility] 12/02/2013.  She did have a Myoview stress test performed 11/20/2021 which was low risk.

## 2023-01-27 NOTE — Assessment & Plan Note (Signed)
History of sinus node function status post permanent transvenous pacemaker implantation by Dr. Ladona Ridgel 07/30/2016.

## 2023-01-27 NOTE — Patient Instructions (Signed)
Medication Instructions:  Your physician recommends that you continue on your current medications as directed. Please refer to the Current Medication list given to you today.  *If you need a refill on your cardiac medications before your next appointment, please call your pharmacy*   Testing/Procedures: Your physician has requested that you have an echocardiogram. Echocardiography is a painless test that uses sound waves to create images of your heart. It provides your doctor with information about the size and shape of your heart and how well your heart's chambers and valves are working. This procedure takes approximately one hour. There are no restrictions for this procedure. Please do NOT wear cologne, perfume, aftershave, or lotions (deodorant is allowed). Please arrive 15 minutes prior to your appointment time.  Please note: We ask at that you not bring children with you during ultrasound (echo/ vascular) testing. Due to room size and safety concerns, children are not allowed in the ultrasound rooms during exams. Our front office staff cannot provide observation of children in our lobby area while testing is being conducted. An adult accompanying a patient to their appointment will only be allowed in the ultrasound room at the discretion of the ultrasound technician under special circumstances. We apologize for any inconvenience.    Follow-Up: At [facility], you and your health needs are our priority.  As part of our continuing mission to provide you with exceptional heart care, we have created designated Provider Care Teams.  These Care Teams include your primary Cardiologist (physician) and Advanced Practice Providers (APPs -  Physician Assistants and Nurse Practitioners) who all work together to provide you with the care you need, when you need it.  We recommend signing up for the patient portal called "MyChart".  Sign up information is provided on this After Visit Summary.   MyChart is used to connect with patients for Virtual Visits (Telemedicine).  Patients are able to view lab/test results, encounter notes, upcoming appointments, etc.  Non-urgent messages can be sent to your provider as well.   To learn more about what you can do with MyChart, go to [email or web address].    Your next appointment:   6 week(s)  Provider:   Nanetta Batty, MD    Other Instructions    Please report to Radiology at the [facility] Main Entrance 30 minutes early for your test.  [address] [place], [place] [address]                        How to Prepare for Your Cardiac PET/CT Stress Test:  Nothing to eat or drink, except water, 3 hours prior to arrival time.  NO caffeine/decaffeinated products, or chocolate 12 hours prior to arrival. (Please note decaffeinated beverages (teas/coffees) still contain caffeine).  If you have caffeine within 12 hours prior, the test will need to be rescheduled.  Medication instructions: Do not take erectile dysfunction medications for 72 hours prior to test (sildenafil, tadalafil)  Do not take nitrates (isosorbide mononitrate, Ranexa) the day before or day of test- **Hold Imdur** Hold hydrochlorothiazide on the day of your procedure. Do not take tamsulosin the day before or morning of test Hold theophylline containing medications for 12 hours. Hold Dipyridamole 48 hours prior to the test.  Diabetic Preparation: If able to eat breakfast prior to 3 hour fasting, you may take all medications, including your insulin. Do not worry if you miss your breakfast dose of insulin - start at your next meal. If  you do not eat prior to 3 hour fast-Hold all diabetes (oral and insulin) medications. Patients who wear a continuous glucose monitor MUST remove the device prior to scanning.  You may take your remaining medications with water.  NO perfume, cologne or lotion on chest or abdomen area. FEMALES - Please avoid wearing dresses to  this appointment.  Total time is 1 to 2 hours; you may want to bring reading material for the waiting time.  IF YOU THINK YOU MAY BE PREGNANT, OR ARE NURSING PLEASE INFORM THE TECHNOLOGIST.  In preparation for your appointment, medication and supplies will be purchased.  Appointment availability is limited, so if you need to cancel or reschedule, please call the Radiology Department at [phone] [facility]) OR [phone] [facility]) 24 hours in advance to avoid a cancellation fee of $100.00  What to Expect When you Arrive:  Once you arrive and check in for your appointment, you will be taken to a preparation room within the Radiology Department.  A technologist or Nurse will obtain your medical history, verify that you are correctly prepped for the exam, and explain the procedure.  Afterwards, an IV will be started in your arm and electrodes will be placed on your skin for EKG monitoring during the stress portion of the exam. Then you will be escorted to the PET/CT scanner.  There, staff will get you positioned on the scanner and obtain a blood pressure and EKG.  During the exam, you will continue to be connected to the EKG and blood pressure machines.  A small, safe amount of a radioactive tracer will be injected in your IV to obtain a series of pictures of your heart along with an injection of a stress agent.    After your Exam:  It is recommended that you eat a meal and drink a caffeinated beverage to counter act any effects of the stress agent.  Drink plenty of fluids for the remainder of the day and urinate frequently for the first couple of hours after the exam.  Your doctor will inform you of your test results within 7-10 business days.  For more information and frequently asked questions, please visit our website: [email or web address]  For questions about your test or how to prepare for your test, please call: Cardiac Imaging Nurse Navigators Office: [phone]

## 2023-01-30 ENCOUNTER — Ambulatory Visit (INDEPENDENT_AMBULATORY_CARE_PROVIDER_SITE_OTHER): Payer: Medicare Other | Admitting: Family

## 2023-01-30 ENCOUNTER — Encounter: Payer: Self-pay | Admitting: Cardiovascular Disease

## 2023-01-30 ENCOUNTER — Encounter (HOSPITAL_BASED_OUTPATIENT_CLINIC_OR_DEPARTMENT_OTHER): Payer: Self-pay | Admitting: Family

## 2023-01-30 VITALS — BP 138/68 | HR 67 | Ht 59.0 in | Wt 147.7 lb

## 2023-01-30 DIAGNOSIS — I350 Nonrheumatic aortic (valve) stenosis: Secondary | ICD-10-CM

## 2023-01-30 DIAGNOSIS — I6523 Occlusion and stenosis of bilateral carotid arteries: Secondary | ICD-10-CM | POA: Diagnosis not present

## 2023-01-30 DIAGNOSIS — I2510 Atherosclerotic heart disease of native coronary artery without angina pectoris: Secondary | ICD-10-CM | POA: Diagnosis not present

## 2023-01-30 DIAGNOSIS — I1A0 Resistant hypertension: Secondary | ICD-10-CM

## 2023-01-30 DIAGNOSIS — E785 Hyperlipidemia, unspecified: Secondary | ICD-10-CM | POA: Diagnosis not present

## 2023-01-30 DIAGNOSIS — I701 Atherosclerosis of renal artery: Secondary | ICD-10-CM

## 2023-01-30 DIAGNOSIS — Z950 Presence of cardiac pacemaker: Secondary | ICD-10-CM

## 2023-01-30 NOTE — Patient Instructions (Addendum)
Medication Instructions:  Your physician recommends that you continue on your current medications as directed. Please refer to the Current Medication list given to you today.  Labwork: Your provider recommends you get the following labs today: Aldosterone + renin   Follow-Up: Follow up in Hypertension Clinic in 3 to 4 months.   Special Instructions:   We are testing renin-aldosterone blood work to make sure there is not hyperaldosteronism causing your high blood pressure.   Overall our goal is for your blood pressure to be less than 130/80. However, as you are pending evaluation of your aortic valve with echocardiogram (ultrasound of your heart), we will wait until your echocardiogram results prior to making any changes. In the interim, we can set a blood pressure goal of <140/90.   Your last echocardiogram (ultrasound of the heart) showed your aortic valve was moderately stiff. If the valve is getting worse, it can present with chest pain, shortness of breath, and lightheadedness/feeling you need to pass out. This is why Dr. Allyson Sabal has recommended a repeat echocardiogram which is scheduled for 02/06/23.   Shortness of breath can also be from ischemia (blockage in your heart arteries). Dr. Allyson Sabal has ordered a cardiac PET as this let us to look at your heart arteries and be sure your previously placed stents from 2015 are working well.  If all of this testing is unremarkable, then we might consider placing a stent in your kidney arteries. The stenosis (stiffening/plaque build up) in those kidney arteries is likely contributing to your high blood pressure.

## 2023-01-30 NOTE — Progress Notes (Signed)
[facility] Initial Assessment:    Date:  01/30/2023   ID:  Amy [name], DOB 1945-07-12, MRN [id number]  PCP:  Billie Lade, MD  Cardiologist:  Nona Dell, MD  Nephrologist:  Referring MD: Perlie Gold, PA-C   CC: Hypertension  History of Present Illness:    Amy [name] is a 77 y.o. female with a hx of CAD s/p LAD and diagonal branch stenting in 2015, HFpEF, HTN, renal artery stenosis, carotid stenosis, moderate AS, HLD, sinus node dysfunction s/p PPM 2018.  Seen by Dr. Allyson Sabal 01/27/23 noting dyspnea in setting of COVID 2 weeks prior and more resistant hypertension over the sat 4-5 weeks. BP log 130-105s/60-70s. Due to worsening dyspnea echo and cardiac PET recommended. She is being considered for renal artery stenting given CTA 01/01/23 with severe bilateral renal artery stenosis with ostail calcific plaque.   Presents today to establish with [facility] with her son-in-law.   Amy [name] was diagnosed with hypertension in her 63s. It has been difficult to control. Blood pressure checked with arm cuff at home previously found to read within 3-4 points of manual reading. Readings have been most often 130-150s. She is concerned about diastolic blood pressure readings as low as the 40s.  she reports tobacco use never. Continues to report dyspnea which is sometimes worse than others. Notes she was having near syncopal spells. This last occurred last week in the setting of getting up to go walk. No chest pain, pressure, tightness. No prior sleep study. Reports no snoring. Wakes up feeling tired as she reports "not feeling good" with all her ongoing issues. Sleeps better than she used to. She does often fall asleep in the afternoon.   Previous antihypertensives: Lisinorpil   Past Medical History:  Diagnosis Date   Asthma    CAD (coronary artery disease)    60-70% proximal LAD/diagonal bifurcational disease October 2015 at [facility] status  DES x 2 to LAD/diagonal October 2015   Essential hypertension    Heart murmur    Hyperlipidemia    Hypothyroidism    Sinus node dysfunction (HCC)    Biotronik pacemaker in place    Past Surgical History:  Procedure Laterality Date   ABDOMINAL HYSTERECTOMY     ANAL FISSURE REPAIR     CARDIAC CATHETERIZATION     CATARACT EXTRACTION W/PHACO Right 10/17/2014   Procedure: CATARACT EXTRACTION PHACO AND INTRAOCULAR LENS PLACEMENT (IOC);  Surgeon: Gemma Payor, MD;  Location: [facility];  Service: Ophthalmology;  Laterality: Right;  CDE: 4.88   CATARACT EXTRACTION W/PHACO Left 10/27/2014   Procedure: CATARACT EXTRACTION PHACO AND INTRAOCULAR LENS PLACEMENT (IOC);  Surgeon: Gemma Payor, MD;  Location: [facility];  Service: Ophthalmology;  Laterality: Left;  CDE:7.86   COLONOSCOPY N/A 04/16/2018   Procedure: COLONOSCOPY;  Surgeon: Malissa Hippo, MD;  Location: [facility] SU[facility];  Service: Endoscopy;  Laterality: N/A;  830   FINGER SURGERY     INSERT / REPLACE / REMOVE PACEMAKER     NASAL SINUS SURGERY     PACEMAKER IMPLANT N/A 07/30/2016   Procedure: Pacemaker Implant Dual Chamber;  Surgeon: Marinus Maw, MD;  Location: [facility] INVASIVE CV LAB;  Service: Cardiovascular;  Laterality: N/A;   TONSILLECTOMY      Current Medications: Current Meds  Medication Sig   amLODipine (NORVASC) 10 MG tablet Take 1 tablet (10 mg total) by mouth every evening.   aspirin 81 MG tablet Take 81 mg by mouth daily.   atorvastatin (  LIPITOR) 20 MG tablet Take 20 mg by mouth at bedtime.    betamethasone valerate ointment (VALISONE) 0.1 % APPLY TO THE AFFECTED AREA(S) TWICE DAILY   calcium carbonate (OSCAL) 1500 (600 Ca) MG TABS tablet Take 1,500 mg by mouth daily with breakfast.   Cholecalciferol (VITAMIN D3) 2000 units capsule Take 2,000 Units by mouth daily at 12 noon.    clopidogrel (PLAVIX) 75 MG tablet TAKE 1 TABLET BY MOUTH DAILY   hydrochlorothiazide (HYDRODIURIL) 12.5 MG tablet Take 12.5 mg by mouth daily.   isosorbide  mononitrate (IMDUR) 60 MG 24 hr tablet TAKE 1 TABLET BY MOUTH DAILY   levothyroxine (SYNTHROID) 75 MCG tablet Take 75 mcg by mouth every morning.   magnesium oxide (MAG-OX) 400 (240 Mg) MG tablet Take 0.5 tablets by mouth daily.   Multiple Vitamin (MULTIVITAMIN WITH MINERALS) TABS tablet Take 1 tablet by mouth daily at 12 noon.    pantoprazole (PROTONIX) 40 MG tablet Take 1 tablet (40 mg total) by mouth daily.   valsartan (DIOVAN) 160 MG tablet Take 1 tablet (160 mg total) by mouth daily.     Allergies:   Sulfa antibiotics   Social History   Socioeconomic History   Marital status: Widowed    Spouse name: Not on file   Number of children: Not on file   Years of education: Not on file   Highest education level: Not on file  Occupational History   Not on file  Tobacco Use   Smoking status: Never   Smokeless tobacco: Never  Vaping Use   Vaping status: Never Used  Substance and Sexual Activity   Alcohol use: No   Drug use: No   Sexual activity: Not Currently    Birth control/protection: None  Other Topics Concern   Not on file  Social History Narrative   Not on file   Social Drivers of Health   Financial Resource Strain: Low Risk  (08/19/2022)   Overall Financial Resource Strain (CARDIA)    Difficulty of Paying Living Expenses: Not hard at all  Food Insecurity: No Food Insecurity (01/30/2023)   Hunger Vital Sign    Worried About Running Out of Food in the Last Year: Never true    Ran Out of Food in the Last Year: Never true  Transportation Needs: No Transportation Needs (01/30/2023)   PRAPARE - Administrator, Civil Service (Medical): No    Lack of Transportation (Non-Medical): No  Physical Activity: Inactive (01/30/2023)   Exercise Vital Sign    Days of Exercise per Week: 0 days    Minutes of Exercise per Session: 0 min  Stress: No Stress Concern Present (08/19/2022)   [name] of Occupational Health - Occupational Stress Questionnaire    Feeling of  Stress : Not at all  Social Connections: Socially Integrated (08/19/2022)   Social Connection and Isolation Panel [NHANES]    Frequency of Communication with Friends and Family: More than three times a week    Frequency of Social Gatherings with Friends and Family: More than three times a week    Attends Religious Services: More than 4 times per year    Active Member of [employer] or Organizations: Yes    Attends Engineer, structural: More than 4 times per year    Marital Status: Married     Family History: The patient's family history includes Heart disease in her father and mother.  ROS:   Please see the history of present illness.  All other systems reviewed and are negative.  EKGs/Labs/Other Studies Reviewed:         Recent Labs: 12/06/2022: ALT 18; BUN 16; Creatinine, Ser 1.00; Hemoglobin 11.5; Platelets 134; Potassium 4.2; Sodium 131 12/27/2022: Magnesium 1.7   Recent Lipid Panel No results found for: "CHOL", "TRIG", "HDL", "CHOLHDL", "VLDL", "LDLCALC", "LDLDIRECT"  Physical Exam:   VS:  BP 138/68   Pulse 67   Ht 4\' 11"  (1.499 m)   Wt 147 lb 11.2 oz (67 kg)   SpO2 100%   BMI 29.83 kg/m  , BMI Body mass index is 29.83 kg/m. GENERAL:  Well appearing HEENT: Pupils equal round and reactive, fundi not visualized, oral mucosa unremarkable NECK:  No jugular venous distention, waveform within normal limits, carotid upstroke brisk and symmetric, no bruits, no thyromegaly LYMPHATICS:  No cervical adenopathy LUNGS:  Clear to auscultation bilaterally HEART:  RRR.  PMI not displaced or sustained,S1 and S2 within normal limits, no S3, no S4, no clicks, no rubs, no murmurs ABD:  Flat, positive bowel sounds normal in frequency in pitch, no bruits, no rebound, no guarding, no midline pulsatile mass, no hepatomegaly, no splenomegaly EXT:  2 plus pulses throughout, no edema, no cyanosis no clubbing SKIN:  No rashes no nodules NEURO:  Cranial nerves II through XII grossly  intact, motor grossly intact throughout PSYCH:  Cognitively intact, oriented to person place and time   ASSESSMENT/PLAN:    HTN -BP not at goal less than 130/80 however given undergoing workup for progression of aortic stenosis would avoid overcorrection of BP.  Reasonable interim BP goal less than 140/90 until echo results obtained.  Continue present antihypertensive regimen amlodipine 10 mg daily, hydrochlorothiazide 12.5 mg daily, Imdur 60 mg daily, valsartan 160 mg daily.  If increased control needed in the future could increase dose of Imdur, valsartan.  Could also consider combination tablet amlodipine-valsartan-HCTZ pending insurance coverage. Plan to reassess BP control pending results of echo/cardiac PET and next steps. Renal artery stenosis, as below. Not candidate for RDN due to RAS.  Renin-aldosterone today to rule out hyperaldosteronism.   Aortic stenosis-echo 11/2021 moderate aortic stenosis.  Notes increased dyspnea, lightheadedness which are better some days than others. Repeat echo upcoming 02/06/23.   Carotid stenosis - Duplex 12/2022 bilateral 1-39% stenosis. Repeat imaging as clinically indicated per Dr. Allyson Sabal. Continue Atorvastatin to prevent progression.   Bilateral renal artery stenosis - Renal Doppler 11/2022 renal aortic ratio 4.5 on the right and 3.9 on the left and CTA 01/01/2023 with severe bilateral renal artery stenosis with ostial calcific plaque.  Has established with Dr. Allyson Sabal. Consider renal artery stenting for improvement in BP control pending results of echo/cardiac PET.  Sinus node dysfunction s/p PPM -follows with Dr. Ladona Ridgel of EP.  HFpEF - Per primary cardiology team.   CAD - s/p LAD diagonal branch stenting 11/2013. Myoview 11/2021 low risk. Cardiac PET ordered by Dr. Allyson Sabal and pending prior auth/scheduling.   Screening for Secondary Hypertension:     01/30/2023    1:15 PM 01/30/2023    1:30 PM  Causes  Drugs/Herbals  Screened     - Comments  No  OTC agents, no excessive caffeine.  Renovascular HTN Screened      - Comments severe renal artery stenosis - follows with Dr. Allyson Sabal   Sleep Apnea  Not Screened     - Comments  no snoring, some waking tired, overall inconsistent with sleep apnea. could consider testing in future if BP remains difficult to control.  Thyroid Disease Screened      - Comments 05/2022 normal TSH   Pheochromocytoma N/A      - Comments 10/2022 CT normal adrenals   Cushing's Syndrome N/A      - Comments non cushingoid appearance   Coarctation of the Aorta N/A   Compliance  Screened     - Comments  takes medications regularly.    Relevant Labs/Studies:    Latest Ref Rng & Units 12/06/2022   11:42 AM 11/27/2022    9:02 AM 11/15/2022    8:41 PM  Basic Labs  Sodium 135 - 145 mmol/L 131  139  133   Potassium 3.5 - 5.1 mmol/L 4.2  4.2  3.3   Creatinine 0.44 - 1.00 mg/dL 1.61  0.96  0.45                    11/28/2022   12:34 PM  Renovascular   Renal Artery [place] Completed Yes      Disposition:    FU with MD/PharmD in 3-4 months    Medication Adjustments/Labs and Tests Ordered: Current medicines are reviewed at length with the patient today.  Concerns regarding medicines are outlined above.  Orders Placed This Encounter  Procedures   Aldosterone + renin activity w/ ratio   No orders of the defined types were placed in this encounter.    Signed, Alver Sorrow, NP  01/30/2023 5:03 PM    [facility]

## 2023-01-31 ENCOUNTER — Ambulatory Visit (INDEPENDENT_AMBULATORY_CARE_PROVIDER_SITE_OTHER): Payer: Medicare Other

## 2023-01-31 ENCOUNTER — Encounter: Payer: Self-pay | Admitting: Cardiology

## 2023-01-31 DIAGNOSIS — I495 Sick sinus syndrome: Secondary | ICD-10-CM | POA: Diagnosis not present

## 2023-02-03 ENCOUNTER — Encounter (HOSPITAL_COMMUNITY): Payer: Self-pay

## 2023-02-03 ENCOUNTER — Telehealth (HOSPITAL_COMMUNITY): Payer: Self-pay | Admitting: Emergency Medicine

## 2023-02-03 LAB — CUP PACEART REMOTE DEVICE CHECK
Date Time Interrogation Session: 20241227070207
Implantable Lead Connection Status: 753985
Implantable Lead Connection Status: 753985
Implantable Lead Implant Date: 20180626
Implantable Lead Implant Date: 20180626
Implantable Lead Location: 753859
Implantable Lead Location: 753860
Implantable Lead Model: 377
Implantable Lead Model: 377
Implantable Lead Serial Number: 49791993
Implantable Lead Serial Number: 49865965
Implantable Pulse Generator Implant Date: 20180626
Pulse Gen Model: 407145
Pulse Gen Serial Number: 69052081

## 2023-02-03 MED ORDER — DIAZEPAM 2 MG PO TABS: ORAL_TABLET | ORAL | Status: DC

## 2023-02-03 NOTE — Telephone Encounter (Signed)
Reaching out to patient to offer assistance regarding upcoming cardiac imaging study; pt verbalizes understanding of appt date/time, parking situation and where to check in, pre-test NPO status and medications ordered, and verified current allergies; name and call back number provided for further questions should they arise Cayne Yom RN Navigator Cardiac Imaging [facility] [phone] office [phone] cell 

## 2023-02-03 NOTE — Addendum Note (Signed)
Addended by: Alver Sorrow on: 02/03/2023 03:40 PM   Modules accepted: Orders

## 2023-02-06 ENCOUNTER — Encounter (HOSPITAL_COMMUNITY)
Admission: RE | Admit: 2023-02-06 | Discharge: 2023-02-06 | Disposition: A | Discharge status: Home / Self Care | Payer: Medicare Other | Source: Ambulatory Visit | Attending: Cardiovascular Disease | Admitting: Cardiovascular Disease

## 2023-02-06 ENCOUNTER — Encounter (HOSPITAL_COMMUNITY): Payer: Self-pay

## 2023-02-06 ENCOUNTER — Other Ambulatory Visit: Payer: Medicare Other

## 2023-02-06 DIAGNOSIS — E785 Hyperlipidemia, unspecified: Secondary | ICD-10-CM | POA: Diagnosis not present

## 2023-02-06 DIAGNOSIS — I2510 Atherosclerotic heart disease of native coronary artery without angina pectoris: Secondary | ICD-10-CM | POA: Insufficient documentation

## 2023-02-06 DIAGNOSIS — R0609 Other forms of dyspnea: Secondary | ICD-10-CM | POA: Insufficient documentation

## 2023-02-06 LAB — NM PET CT CARDIAC PERFUSION MULTI W/ABSOLUTE BLOODFLOW
MBFR: 2.01
Nuc Rest EF: 70 %
Nuc Stress EF: 68 %
Peak HR: 65 {beats}/min
Rest HR: 74 {beats}/min
Rest MBF: 1.05 ml/g/min
Rest Nuclear Isotope Dose: 17.6 mCi
ST Depression (mm): 0 mm
Stress MBF: 2.11 ml/g/min
Stress Nuclear Isotope Dose: 17.7 mCi
TID: 1

## 2023-02-06 LAB — ALDOSTERONE + RENIN ACTIVITY W/ RATIO
Aldos/Renin Ratio: 0.2 (ref 0.0–30.0)
Aldosterone: 2.5 ng/dL (ref 0.0–30.0)
Renin Activity, Plasma: 10.475 ng/mL/h — ABNORMAL HIGH (ref 0.167–5.380)

## 2023-02-06 MED ORDER — REGADENOSON 0.4 MG/5ML IV SOLN
0.4000 mg | Freq: Once | INTRAVENOUS | Status: AC
  Administered 2023-02-06: 0.4 mg via INTRAVENOUS

## 2023-02-06 MED ORDER — RUBIDIUM RB82 GENERATOR (RUBYFILL)
17.6700 | PACK | Freq: Once | INTRAVENOUS | Status: AC
  Administered 2023-02-06: 17.67 via INTRAVENOUS

## 2023-02-06 MED ORDER — RUBIDIUM RB82 GENERATOR (RUBYFILL)
17.6100 | PACK | Freq: Once | INTRAVENOUS | Status: AC
  Administered 2023-02-06: 17.61 via INTRAVENOUS

## 2023-02-06 MED ORDER — REGADENOSON 0.4 MG/5ML IV SOLN
INTRAVENOUS | Status: AC
  Filled 2023-02-06: qty 5

## 2023-02-11 ENCOUNTER — Ambulatory Visit: Payer: Medicare Other | Attending: Cardiovascular Disease

## 2023-02-11 ENCOUNTER — Other Ambulatory Visit: Payer: Self-pay | Admitting: Cardiology

## 2023-02-11 DIAGNOSIS — E785 Hyperlipidemia, unspecified: Secondary | ICD-10-CM | POA: Diagnosis not present

## 2023-02-11 DIAGNOSIS — I495 Sick sinus syndrome: Secondary | ICD-10-CM | POA: Diagnosis not present

## 2023-02-11 DIAGNOSIS — I2510 Atherosclerotic heart disease of native coronary artery without angina pectoris: Secondary | ICD-10-CM | POA: Diagnosis not present

## 2023-02-11 DIAGNOSIS — I10 Essential (primary) hypertension: Secondary | ICD-10-CM | POA: Insufficient documentation

## 2023-02-11 DIAGNOSIS — R0609 Other forms of dyspnea: Secondary | ICD-10-CM | POA: Insufficient documentation

## 2023-02-11 DIAGNOSIS — I350 Nonrheumatic aortic (valve) stenosis: Secondary | ICD-10-CM | POA: Diagnosis not present

## 2023-02-11 LAB — ECHOCARDIOGRAM COMPLETE
AR max vel: 1.88 cm2
AV Area VTI: 1.92 cm2
AV Area mean vel: 1.94 cm2
AV Mean grad: 8 mm[Hg]
AV Peak grad: 16.3 mm[Hg]
Ao pk vel: 2.02 m/s
Area-P 1/2: 2.95 cm2
Calc EF: 60.6 %
MV VTI: 2.43 cm2
S' Lateral: 2.6 cm
Single Plane A2C EF: 48.7 %
Single Plane A4C EF: 64 %

## 2023-02-12 ENCOUNTER — Encounter (HOSPITAL_BASED_OUTPATIENT_CLINIC_OR_DEPARTMENT_OTHER): Payer: Self-pay

## 2023-02-13 DIAGNOSIS — H01001 Unspecified blepharitis right upper eyelid: Secondary | ICD-10-CM | POA: Diagnosis not present

## 2023-02-13 DIAGNOSIS — Z961 Presence of intraocular lens: Secondary | ICD-10-CM | POA: Diagnosis not present

## 2023-02-13 DIAGNOSIS — H01002 Unspecified blepharitis right lower eyelid: Secondary | ICD-10-CM | POA: Diagnosis not present

## 2023-02-13 DIAGNOSIS — H01004 Unspecified blepharitis left upper eyelid: Secondary | ICD-10-CM | POA: Diagnosis not present

## 2023-02-13 DIAGNOSIS — H04123 Dry eye syndrome of bilateral lacrimal glands: Secondary | ICD-10-CM | POA: Diagnosis not present

## 2023-02-13 DIAGNOSIS — H02834 Dermatochalasis of left upper eyelid: Secondary | ICD-10-CM | POA: Diagnosis not present

## 2023-02-13 DIAGNOSIS — H01005 Unspecified blepharitis left lower eyelid: Secondary | ICD-10-CM | POA: Diagnosis not present

## 2023-02-13 DIAGNOSIS — H02831 Dermatochalasis of right upper eyelid: Secondary | ICD-10-CM | POA: Diagnosis not present

## 2023-03-03 ENCOUNTER — Other Ambulatory Visit: Payer: Self-pay | Admitting: Cardiology

## 2023-03-06 DIAGNOSIS — H01002 Unspecified blepharitis right lower eyelid: Secondary | ICD-10-CM | POA: Diagnosis not present

## 2023-03-06 DIAGNOSIS — H01001 Unspecified blepharitis right upper eyelid: Secondary | ICD-10-CM | POA: Diagnosis not present

## 2023-03-06 DIAGNOSIS — H04123 Dry eye syndrome of bilateral lacrimal glands: Secondary | ICD-10-CM | POA: Diagnosis not present

## 2023-03-06 DIAGNOSIS — H01004 Unspecified blepharitis left upper eyelid: Secondary | ICD-10-CM | POA: Diagnosis not present

## 2023-03-07 NOTE — Progress Notes (Signed)
 Remote pacemaker transmission.

## 2023-03-07 NOTE — Addendum Note (Signed)
Addended by: Elease Etienne A on: 03/07/2023 09:50 AM   Modules accepted: Orders

## 2023-03-17 ENCOUNTER — Encounter: Payer: Self-pay | Admitting: Internal Medicine

## 2023-03-17 ENCOUNTER — Ambulatory Visit (INDEPENDENT_AMBULATORY_CARE_PROVIDER_SITE_OTHER): Payer: Medicare Other | Admitting: Internal Medicine

## 2023-03-17 VITALS — BP 149/57 | HR 63 | Ht 59.0 in | Wt 143.8 lb

## 2023-03-17 DIAGNOSIS — E782 Mixed hyperlipidemia: Secondary | ICD-10-CM | POA: Diagnosis not present

## 2023-03-17 DIAGNOSIS — E039 Hypothyroidism, unspecified: Secondary | ICD-10-CM | POA: Diagnosis not present

## 2023-03-17 DIAGNOSIS — M8580 Other specified disorders of bone density and structure, unspecified site: Secondary | ICD-10-CM | POA: Diagnosis not present

## 2023-03-17 DIAGNOSIS — D649 Anemia, unspecified: Secondary | ICD-10-CM | POA: Diagnosis not present

## 2023-03-17 DIAGNOSIS — I1A0 Resistant hypertension: Secondary | ICD-10-CM

## 2023-03-17 DIAGNOSIS — R799 Abnormal finding of blood chemistry, unspecified: Secondary | ICD-10-CM | POA: Diagnosis not present

## 2023-03-17 DIAGNOSIS — N1831 Chronic kidney disease, stage 3a: Secondary | ICD-10-CM | POA: Diagnosis not present

## 2023-03-17 DIAGNOSIS — R161 Splenomegaly, not elsewhere classified: Secondary | ICD-10-CM | POA: Diagnosis not present

## 2023-03-17 DIAGNOSIS — I10 Essential (primary) hypertension: Secondary | ICD-10-CM

## 2023-03-17 DIAGNOSIS — K219 Gastro-esophageal reflux disease without esophagitis: Secondary | ICD-10-CM | POA: Diagnosis not present

## 2023-03-17 NOTE — Assessment & Plan Note (Signed)
Currently prescribed levothyroxine 75 mcg daily.  Repeat thyroid studies ordered today.

## 2023-03-17 NOTE — Assessment & Plan Note (Signed)
Currently prescribed atorvastatin 20 mg daily.  Repeat lipid panel ordered today. 

## 2023-03-17 NOTE — Patient Instructions (Signed)
 It was a pleasure to see you today.  Thank you for giving [place] the opportunity to be involved in your care.  Below is a brief recap of your visit and next steps.  We will plan to see you again in 3 months.  Summary No medication changes today Repeat labs ordered Follow up in 3 months

## 2023-03-17 NOTE — Assessment & Plan Note (Signed)
 Hgb ~11 recently, down from 14 as of two years ago. Will repeat CBC today and add iron  studies to see if this is contributing to worsening dyspnea with exertion.

## 2023-03-17 NOTE — Progress Notes (Signed)
 Established Patient Office Visit  Subjective   Patient ID: Amy [name], female    DOB: 1945-06-22  Age: 78 y.o. MRN: [id number]  Chief Complaint  Patient presents with   Care Management    Follow up    Shortness of Breath    Patient can not walk as much without getting tired and short of breath    Ms. Amy [name] returns to care today for routine follow-up.  She was last evaluated by me in November 2024.  No medication changes were made at that time and 76-month follow-up was arranged.  In the interim, she has been seen by cardiology on multiple occasions and completed additional diagnostic imaging in the setting of resistant hypertension with bilateral renal artery stenosis.  Today she reports feeling fairly well.  She endorses increasing dyspnea with exertion.  Denies orthopnea/PND and has not appreciated any lower extremity edema.  She has been taking her medications as prescribed.  She is concerned about splenomegaly.  Repeat CT is scheduled for later this month.  Of note, spleen was unchanged in size on CTA abdomen obtained in November.  Past Medical History:  Diagnosis Date   Asthma    CAD (coronary artery disease)    60-70% proximal LAD/diagonal bifurcational disease October 2015 at [facility] status DES x 2 to LAD/diagonal October 2015   Essential hypertension    Heart murmur    Hyperlipidemia    Hypothyroidism    Sinus node dysfunction (HCC)    Biotronik pacemaker in place   Past Surgical History:  Procedure Laterality Date   ABDOMINAL HYSTERECTOMY     ANAL FISSURE REPAIR     CARDIAC CATHETERIZATION     CATARACT EXTRACTION W/PHACO Right 10/17/2014   Procedure: CATARACT EXTRACTION PHACO AND INTRAOCULAR LENS PLACEMENT (IOC);  Surgeon: Anner Kill, MD;  Location: [facility];  Service: Ophthalmology;  Laterality: Right;  CDE: 4.88   CATARACT EXTRACTION W/PHACO Left 10/27/2014   Procedure: CATARACT EXTRACTION PHACO AND INTRAOCULAR LENS PLACEMENT (IOC);  Surgeon: Anner Kill, MD;  Location:  [facility];  Service: Ophthalmology;  Laterality: Left;  CDE:7.86   COLONOSCOPY N/A 04/16/2018   Procedure: COLONOSCOPY;  Surgeon: Ruby Corporal, MD;  Location: [facility];  Service: Endoscopy;  Laterality: N/A;  830   FINGER SURGERY     INSERT / REPLACE / REMOVE PACEMAKER     NASAL SINUS SURGERY     PACEMAKER IMPLANT N/A 07/30/2016   Procedure: Pacemaker Implant Dual Chamber;  Surgeon: Tammie Fall, MD;  Location: [facility] INVASIVE CV LAB;  Service: Cardiovascular;  Laterality: N/A;   TONSILLECTOMY     Social History   Tobacco Use   Smoking status: Never   Smokeless tobacco: Never  Vaping Use   Vaping status: Never Used  Substance Use Topics   Alcohol  use: No   Drug use: No   Family History  Problem Relation Age of Onset   Heart disease Mother    Heart disease Father    Allergies  Allergen Reactions   Sulfa Antibiotics Rash   Review of Systems  Constitutional:  Negative for chills and fever.  HENT:  Negative for sore throat.   Respiratory:  Positive for shortness of breath (With exertion). Negative for cough and sputum production.   Cardiovascular:  Negative for chest pain, palpitations and leg swelling.  Gastrointestinal:  Negative for abdominal pain, blood in stool, constipation, diarrhea, nausea and vomiting.  Genitourinary:  Negative for dysuria and hematuria.  Musculoskeletal:  Negative for myalgias.  Skin:  Negative for itching and rash.  Neurological:  Negative for dizziness and headaches.  Psychiatric/Behavioral:  Negative for depression and suicidal ideas.      Objective:     BP (!) 149/57 (BP Location: Left Arm, Patient Position: Sitting, Cuff Size: Normal)   Pulse 63   Ht 4\' 11"  (1.499 m)   Wt 143 lb 12.8 oz (65.2 kg)   SpO2 98%   BMI 29.04 kg/m  BP Readings from Last 3 Encounters:  03/17/23 (!) 149/57  02/06/23 (!) 170/89  01/30/23 138/68   Physical Exam Vitals reviewed.  Constitutional:      General: She is not in acute distress.     Appearance: Normal appearance. She is not toxic-appearing.  HENT:     Head: Normocephalic and atraumatic.     Right Ear: External ear normal.     Left Ear: External ear normal.     Nose: Nose normal. No congestion or rhinorrhea.     Mouth/Throat:     Mouth: Mucous membranes are moist.     Pharynx: Oropharynx is clear. No oropharyngeal exudate or posterior oropharyngeal erythema.  Eyes:     General: No scleral icterus.    Extraocular Movements: Extraocular movements intact.     Conjunctiva/sclera: Conjunctivae normal.     Pupils: Pupils are equal, round, and reactive to light.  Cardiovascular:     Rate and Rhythm: Normal rate and regular rhythm.     Pulses: Normal pulses.     Heart sounds: Murmur heard.     No friction rub. No gallop.  Pulmonary:     Effort: Pulmonary effort is normal.     Breath sounds: Normal breath sounds. No wheezing, rhonchi or rales.  Abdominal:     General: Abdomen is flat. Bowel sounds are normal. There is no distension.     Palpations: Abdomen is soft.     Tenderness: There is no abdominal tenderness.  Musculoskeletal:        General: No swelling. Normal range of motion.     Cervical back: Normal range of motion.     Right lower leg: No edema.     Left lower leg: No edema.  Lymphadenopathy:     Cervical: No cervical adenopathy.  Skin:    General: Skin is warm and dry.     Capillary Refill: Capillary refill takes less than 2 seconds.     Coloration: Skin is not jaundiced.  Neurological:     General: No focal deficit present.     Mental Status: She is alert and oriented to person, place, and time.  Psychiatric:        Mood and Affect: Mood normal.        Behavior: Behavior normal.   Last CBC Lab Results  Component Value Date   WBC 5.3 12/06/2022   HGB 11.5 (L) 12/06/2022   HCT 34.7 (L) 12/06/2022   MCV 85.7 12/06/2022   MCH 28.4 12/06/2022   RDW 13.9 12/06/2022   PLT 134 (L) 12/06/2022   Last metabolic panel Lab Results  Component Value  Date   GLUCOSE 100 (H) 12/06/2022   NA 131 (L) 12/06/2022   K 4.2 12/06/2022   CL 99 12/06/2022   CO2 25 12/06/2022   BUN 16 12/06/2022   CREATININE 1.00 12/06/2022   GFRNONAA 58 (L) 12/06/2022   CALCIUM 9.0 12/06/2022   PROT 6.2 (L) 12/06/2022   ALBUMIN 3.6 12/06/2022   BILITOT 1.9 (H) 12/06/2022   ALKPHOS 86 12/06/2022  AST 23 12/06/2022   ALT 18 12/06/2022   ANIONGAP 7 12/06/2022     Assessment & Plan:   Problem List Items Addressed This Visit       Resistant hypertension   BP remains mildly elevated today.  She has been seen by cardiology on multiple occasions for treatment of resistant hypertension since her last appointment with me.  Currently prescribed amlodipine  10 mg daily, HCTZ 12.5 mg daily, Imdur  60 mg daily, and valsartan  160 mg daily.  She has recently completed TTE and cardiac PET.  She will follow-up with Dr. Dean Every this Wednesday (2/12).       GERD (gastroesophageal reflux disease)   Symptoms remain adequately controlled with pantoprazole .  No medication changes are indicated today.      Hypothyroidism   Currently prescribed levothyroxine  75 mcg daily.  Repeat thyroid  studies ordered today.      Splenomegaly   She remains concerned about splenomegaly.  Repeat CT scheduled for later this month.  Oncology follow-up scheduled for early March.      Hyperlipidemia   Currently prescribed atorvastatin 20 mg daily.  Repeat lipid panel ordered today.      Normocytic anemia   Hgb ~11 recently, down from 14 as of two years ago. Will repeat CBC today and add iron  studies to see if this is contributing to worsening dyspnea with exertion.       Return in about 3 months (around 06/14/2023).   Tobi Fortes, MD

## 2023-03-17 NOTE — Assessment & Plan Note (Signed)
 BP remains mildly elevated today.  She has been seen by cardiology on multiple occasions for treatment of resistant hypertension since her last appointment with me.  Currently prescribed amlodipine  10 mg daily, HCTZ 12.5 mg daily, Imdur  60 mg daily, and valsartan  160 mg daily.  She has recently completed TTE and cardiac PET.  She will follow-up with Dr. Dean Every this Wednesday (2/12).

## 2023-03-17 NOTE — Assessment & Plan Note (Signed)
 She remains concerned about splenomegaly.  Repeat CT scheduled for later this month.  Oncology follow-up scheduled for early March.

## 2023-03-17 NOTE — Assessment & Plan Note (Signed)
 Symptoms remain adequately controlled with pantoprazole.  No medication changes are indicated today.

## 2023-03-18 ENCOUNTER — Ambulatory Visit: Payer: Medicare Other | Attending: Cardiology | Admitting: Cardiology

## 2023-03-18 ENCOUNTER — Encounter: Payer: Self-pay | Admitting: Cardiology

## 2023-03-18 ENCOUNTER — Encounter: Payer: Self-pay | Admitting: Internal Medicine

## 2023-03-18 ENCOUNTER — Other Ambulatory Visit: Payer: Self-pay | Admitting: Internal Medicine

## 2023-03-18 VITALS — BP 124/60 | HR 64 | Ht 58.75 in | Wt 143.4 lb

## 2023-03-18 DIAGNOSIS — I701 Atherosclerosis of renal artery: Secondary | ICD-10-CM | POA: Insufficient documentation

## 2023-03-18 DIAGNOSIS — I25119 Atherosclerotic heart disease of native coronary artery with unspecified angina pectoris: Secondary | ICD-10-CM | POA: Diagnosis not present

## 2023-03-18 DIAGNOSIS — Z950 Presence of cardiac pacemaker: Secondary | ICD-10-CM | POA: Diagnosis not present

## 2023-03-18 DIAGNOSIS — D509 Iron deficiency anemia, unspecified: Secondary | ICD-10-CM

## 2023-03-18 DIAGNOSIS — I350 Nonrheumatic aortic (valve) stenosis: Secondary | ICD-10-CM | POA: Diagnosis not present

## 2023-03-18 DIAGNOSIS — I495 Sick sinus syndrome: Secondary | ICD-10-CM | POA: Diagnosis not present

## 2023-03-18 LAB — VITAMIN D 25 HYDROXY (VIT D DEFICIENCY, FRACTURES): Vit D, 25-Hydroxy: 68.6 ng/mL (ref 30.0–100.0)

## 2023-03-18 LAB — CMP14+EGFR
ALT: 14 [IU]/L (ref 0–32)
AST: 19 [IU]/L (ref 0–40)
Albumin: 4 g/dL (ref 3.8–4.8)
Alkaline Phosphatase: 121 [IU]/L (ref 44–121)
BUN/Creatinine Ratio: 23 (ref 12–28)
BUN: 23 mg/dL (ref 8–27)
Bilirubin Total: 1.1 mg/dL (ref 0.0–1.2)
CO2: 23 mmol/L (ref 20–29)
Calcium: 8.8 mg/dL (ref 8.7–10.3)
Chloride: 101 mmol/L (ref 96–106)
Creatinine, Ser: 1.01 mg/dL — ABNORMAL HIGH (ref 0.57–1.00)
Globulin, Total: 2.2 g/dL (ref 1.5–4.5)
Glucose: 98 mg/dL (ref 70–99)
Potassium: 3.9 mmol/L (ref 3.5–5.2)
Sodium: 139 mmol/L (ref 134–144)
Total Protein: 6.2 g/dL (ref 6.0–8.5)
eGFR: 57 mL/min/{1.73_m2} — ABNORMAL LOW (ref >59)

## 2023-03-18 LAB — IRON,TIBC AND FERRITIN PANEL
Ferritin: 38 ng/mL (ref 15–150)
Iron Saturation: 11 % — ABNORMAL LOW (ref 15–55)
Iron: 37 ug/dL (ref 27–139)
Total Iron Binding Capacity: 349 ug/dL (ref 250–450)
UIBC: 312 ug/dL (ref 118–369)

## 2023-03-18 LAB — CBC WITH DIFFERENTIAL/PLATELET
Basophils Absolute: 0 10*3/uL (ref 0.0–0.2)
Basos: 1 %
EOS (ABSOLUTE): 0.2 10*3/uL (ref 0.0–0.4)
Eos: 3 %
Hematocrit: 31.1 % — ABNORMAL LOW (ref 34.0–46.6)
Hemoglobin: 10.8 g/dL — ABNORMAL LOW (ref 11.1–15.9)
Immature Grans (Abs): 0 10*3/uL (ref 0.0–0.1)
Immature Granulocytes: 0 %
Lymphocytes Absolute: 2 10*3/uL (ref 0.7–3.1)
Lymphs: 32 %
MCH: 29.9 pg (ref 26.6–33.0)
MCHC: 34.7 g/dL (ref 31.5–35.7)
MCV: 86 fL (ref 79–97)
Monocytes Absolute: 0.6 10*3/uL (ref 0.1–0.9)
Monocytes: 10 %
Neutrophils Absolute: 3.3 10*3/uL (ref 1.4–7.0)
Neutrophils: 54 %
Platelets: 169 10*3/uL (ref 150–450)
RBC: 3.61 x10E6/uL — ABNORMAL LOW (ref 3.77–5.28)
RDW: 12.3 % (ref 11.7–15.4)
WBC: 6.1 10*3/uL (ref 3.4–10.8)

## 2023-03-18 LAB — LIPID PANEL
Chol/HDL Ratio: 2 {ratio} (ref 0.0–4.4)
Cholesterol, Total: 137 mg/dL (ref 100–199)
HDL: 67 mg/dL (ref >39)
LDL Chol Calc (NIH): 58 mg/dL (ref 0–99)
Triglycerides: 58 mg/dL (ref 0–149)
VLDL Cholesterol Cal: 12 mg/dL (ref 5–40)

## 2023-03-18 LAB — B12 AND FOLATE PANEL
Folate: >20 ng/mL (ref >3.0)
Vitamin B-12: 761 pg/mL (ref 232–1245)

## 2023-03-18 LAB — HEMOGLOBIN A1C
Est. average glucose Bld gHb Est-mCnc: 103 mg/dL
Hgb A1c MFr Bld: 5.2 % (ref 4.8–5.6)

## 2023-03-18 LAB — TSH+FREE T4
Free T4: 1.86 ng/dL — ABNORMAL HIGH (ref 0.82–1.77)
TSH: 1.61 u[IU]/mL (ref 0.450–4.500)

## 2023-03-18 MED ORDER — IRON (FERROUS SULFATE) 325 (65 FE) MG PO TABS: 325.0000 mg | ORAL_TABLET | Freq: Every day | ORAL | 3 refills | Status: DC

## 2023-03-18 NOTE — Progress Notes (Signed)
Cardiology Office Note  Date: 03/18/2023   ID: Amy [name], DOB February 16, 1945, MRN [id number]  History of Present Illness: Amy [name] is a 78 y.o. female last seen in November 2024 by Ms. Amy Nettle NP, I reviewed the note.  She has had subsequent follow-up in the hypertension clinic in [place].  She is also seeing Dr. Allyson [name] given diagnosis of bilateral renal artery stenosis.  I reviewed updated studies obtained since our last encounter and noted below.  She is here today with her son.  Complains of chronic dyspnea on exertion as before, no exertional chest pain, no palpitations or syncope.  I reviewed her medications.  Current regimen includes aspirin, Norvasc, Lipitor, Plavix, HCTZ, Imdur, and Diovan.  She reports compliance with therapy.  Today's measured blood pressure shows good control.  I reviewed her most recent lab work as well, creatinine 1.01 and GFR 57.  Biotronik pacemaker in place with follow-up by Dr. Ladona [name].  Device interrogation in December 2024 indicated normal function.  Physical Exam: VS:  BP 124/60 (BP Location: Right Arm, Patient Position: Sitting, Cuff Size: Normal)   Pulse 64   Ht 4' 10.75" (1.492 m)   Wt 143 lb 6.4 oz (65 kg)   SpO2 99%   BMI 29.21 kg/m , BMI Body mass index is 29.21 kg/m.  Wt Readings from Last 3 Encounters:  03/18/23 143 lb 6.4 oz (65 kg)  03/17/23 143 lb 12.8 oz (65.2 kg)  01/30/23 147 lb 11.2 oz (67 kg)    General: Patient appears comfortable at rest. HEENT: Conjunctiva and lids normal. Neck: Supple, no elevated JVP or carotid bruits. Lungs: Clear to auscultation, nonlabored breathing at rest. Cardiac: Regular rate and rhythm, no S3, 2/6 systolic murmur, no pericardial rub. Extremities: Trace right ankle edema.  ECG:  An ECG dated 12/06/2022 was personally reviewed today and demonstrated:  Sinus rhythm with LVH.  Labwork: 12/27/2022: Magnesium 1.7 03/17/2023: ALT 14; AST 19; BUN 23; Creatinine, Ser 1.01; Hemoglobin 10.8; Platelets  169; Potassium 3.9; Sodium 139; TSH 1.610     Component Value Date/Time   CHOL 137 03/17/2023 1653   TRIG 58 03/17/2023 1653   HDL 67 03/17/2023 1653   CHOLHDL 2.0 03/17/2023 1653   LDLCALC 58 03/17/2023 1653   Other Studies Reviewed Today:  PET-CT myocardial perfusion study 02/06/2023:   The study is normal. The study is low risk.   Arrhythmias during stress: occasional PVCs. Arrhythmias during recovery: frequent PACs, frequent PVCs.   LV perfusion is normal. There is no evidence of ischemia. There is no evidence of infarction.   Rest left ventricular function is normal. Rest EF: 70%. Stress left ventricular function is normal. Stress EF: 68%. Calculated EF likely affected by gating errors with frequent ectopy in stress/recovery, lack of augmentation of EF with stress may be spurious. End diastolic cavity size is normal. End systolic cavity size is normal. No evidence of transient ischemic dilation (TID) noted.   Myocardial blood flow was computed to be 1.48ml/g/min at rest and 2.54ml/g/min at stress. Global myocardial blood flow reserve was 2.01 and was normal.   Coronary calcium assessment not performed due to prior revascularization.  Echocardiogram 02/11/2023:  1. Left ventricular ejection fraction, by estimation, is 60 to 65%. The  left ventricle has normal function. The left ventricle has no regional  wall motion abnormalities. There is mild concentric left ventricular  hypertrophy. Left ventricular diastolic  parameters are consistent with Grade I diastolic dysfunction (impaired  relaxation). The average left ventricular  global longitudinal strain is  -18.7 %. The global longitudinal strain is normal.   2. Right ventricular systolic function is normal. The right ventricular  size is normal. There is mildly elevated pulmonary artery systolic  pressure. The estimated right ventricular systolic pressure is 43.4 mmHg.   3. Left atrial size was mild to moderately dilated.   4. The  mitral valve is degenerative. Mild mitral valve regurgitation. The  mean mitral valve gradient is 4.0 mmHg. Moderate mitral annular  calcification.   5. Tricuspid valve regurgitation is mild to moderate.   6. The aortic valve is tricuspid. There is mild calcification of the  aortic valve. Aortic valve regurgitation is not visualized. Mild aortic  valve stenosis. Aortic valve area, by VTI measures 1.92 cm. Aortic valve  mean gradient measures 8.0 mmHg.   7. The inferior vena cava is normal in size with greater than 50%  respiratory variability, suggesting right atrial pressure of 3 mmHg.   Assessment and Plan:  1.  CAD status post DES x 2 to the LAD/diagonal bifurcation in 2015.  Recent follow-up PET-CT myocardial perfusion study in January indicated no ischemia.  Plan to continue aspirin, Plavix, Imdur, and Lipitor.  2.  Severe bilateral renal artery stenosis with ostial calcified atherosclerotic plaque based on CT imaging in November 2024.  She is following with Dr. Allyson [name].  At the present time she has reasonable blood pressure control on current medical therapy including Norvasc, Diovan, and HCTZ.  Renal function also stable with creatinine 1.01 and GFR 57.  Not clear that there is an indication for revascularization, but will defer to Dr. Allyson [name] who sees Amy [name] tomorrow.  3.  Mild calcific aortic stenosis by follow-up echocardiogram in January, mean AV gradient 8 mmHg and dimensionless index 0.68.  This is unlikely to be contributing to her symptoms and can be followed over time.  4.  Primary hypertension.  Blood pressure control is reasonable today.  Continue current regimen including Norvasc, HCTZ, and Diovan.  5.  History of HFpEF, LVEF 60 to 65% with grade 1 diastolic dysfunction and normal RV contraction, mildly elevated estimated RVSP by echocardiogram in January.  She did not tolerate [place] previously.  Present diuretic is in the form of HCTZ, she is also on Diovan.  6.  Sinus  node dysfunction status post Biotronik pacemaker with follow-up by Dr. Ladona [name].  Disposition:  Follow up  6 months.  Signed, Amy [name], M.D., F.A.C.C. [facility]

## 2023-03-18 NOTE — Patient Instructions (Addendum)

## 2023-03-19 ENCOUNTER — Encounter: Payer: Self-pay | Admitting: Cardiovascular Disease

## 2023-03-19 ENCOUNTER — Ambulatory Visit (INDEPENDENT_AMBULATORY_CARE_PROVIDER_SITE_OTHER): Payer: Medicare Other | Admitting: Cardiovascular Disease

## 2023-03-19 VITALS — BP 130/62 | HR 77 | Ht 58.75 in | Wt 144.2 lb

## 2023-03-19 DIAGNOSIS — Z950 Presence of cardiac pacemaker: Secondary | ICD-10-CM | POA: Diagnosis not present

## 2023-03-19 DIAGNOSIS — I701 Atherosclerosis of renal artery: Secondary | ICD-10-CM | POA: Insufficient documentation

## 2023-03-19 DIAGNOSIS — I350 Nonrheumatic aortic (valve) stenosis: Secondary | ICD-10-CM | POA: Diagnosis not present

## 2023-03-19 DIAGNOSIS — I25119 Atherosclerotic heart disease of native coronary artery with unspecified angina pectoris: Secondary | ICD-10-CM | POA: Diagnosis not present

## 2023-03-19 DIAGNOSIS — I495 Sick sinus syndrome: Secondary | ICD-10-CM | POA: Diagnosis not present

## 2023-03-19 NOTE — Progress Notes (Signed)
03/19/2023 Amy [name]   [birth year]  [id number]  Primary Physician Billie Lade, MD Primary Cardiologist: Runell Gess MD Nicholes Calamity, [place]  HPI:  Amy [name] is a 78 y.o.   thin-appearing widowed Caucasian female mother of 1 daughter with no grandchildren who was referred by Perlie Gold, PA-C and Dr. Diona Browner for evaluation of potential renal vascular hypertension.  She is accompanied by her daughter Amy [name] today.  I last saw her in the office 01/27/2023.  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  Risk factors include treated hypertension and hyperlipidemia.  Mother did have CABG.  She has never had a heart attack or stroke.  She does complain of some dyspnea.  She did have COVID 2 weeks ago.  She had hypertension for years which has become more resistant over the last 4 to 6 weeks.  This, needed in an admission to the hospital on 11 1.  She also has moderate aortic stenosis with normal LV systolic function.  She had renal Doppler studies performed 11/28/2022 that revealed potentially bilateral renal artery stenosis with a right renal aortic ratio of 4.5 and a left of 3.9.   Her blood blood pressures have been under fairly good control with pressures in the 1 30-1 50 range over 60-70.  She is on amlodipine, hydrochlorothiazide and valsartan.  Her abdominal CTA did reveal severe bilateral renal artery stenosis.  She has been complaining of progressive dyspnea on exertion.  She does have a history of moderate aortic stenosis by 2D echo performed 11/20/2021.  At the same time her Myoview stress test was low risk and nonischemic ischemic.  She does have a history of CAD status post LAD and diagonal branch stenting with Promus drug-eluting stents at [facility] 12/02/2013 as well as Allyson Sabal transvenous pacemaker insertion by Dr. Ladona Ridgel 08/01/2016 for sinus node dysfunction.  Since I saw her 2 months ago her blood pressures have remained stable on her current medications.  There  is no indication at this time to perform renal artery intervention.  Should her blood pressures become more difficult to control and/or if her renal function started to deteriorate we can revisit this.  I am going to continue to follow her renal Dopplers on an annual basis.   Current Meds  Medication Sig   amLODipine (NORVASC) 10 MG tablet Take 1 tablet (10 mg total) by mouth every evening.   aspirin 81 MG tablet Take 81 mg by mouth daily.   atorvastatin (LIPITOR) 20 MG tablet Take 20 mg by mouth at bedtime.    betamethasone valerate ointment (VALISONE) 0.1 % APPLY TO THE AFFECTED AREA(S) TWICE DAILY   calcium carbonate (OSCAL) 1500 (600 Ca) MG TABS tablet Take 1,500 mg by mouth daily with breakfast.   Cholecalciferol (VITAMIN D3) 2000 units capsule Take 2,000 Units by mouth daily at 12 noon.    clobetasol (TEMOVATE) 0.05 % external solution Apply 1 application  topically as needed (psoriasis).   clopidogrel (PLAVIX) 75 MG tablet TAKE 1 TABLET BY MOUTH DAILY   hydrochlorothiazide (HYDRODIURIL) 12.5 MG tablet TAKE 1 TABLET BY MOUTH EVERY DAY HIGH BLOOD PRESSURE   isosorbide mononitrate (IMDUR) 60 MG 24 hr tablet TAKE 1 TABLET BY MOUTH DAILY   levothyroxine (SYNTHROID) 75 MCG tablet Take 75 mcg by mouth every morning.   magnesium oxide (MAG-OX) 400 (240 Mg) MG tablet Take 0.5 tablets by mouth daily.   Multiple Vitamin (MULTIVITAMIN WITH MINERALS) TABS tablet Take 1 tablet by  mouth daily at 12 noon.    pantoprazole (PROTONIX) 40 MG tablet Take 1 tablet (40 mg total) by mouth daily.   valsartan (DIOVAN) 160 MG tablet Take 1 tablet (160 mg total) by mouth daily.     Allergies  Allergen Reactions   Sulfa Antibiotics Rash    Social History   Socioeconomic History   Marital status: Widowed    Spouse name: Not on file   Number of children: Not on file   Years of education: Not on file   Highest education level: Not on file  Occupational History   Not on file  Tobacco Use   Smoking  status: Never   Smokeless tobacco: Never  Vaping Use   Vaping status: Never Used  Substance and Sexual Activity   Alcohol use: No   Drug use: No   Sexual activity: Not Currently    Birth control/protection: None  Other Topics Concern   Not on file  Social History Narrative   Not on file   Social Drivers of Health   Financial Resource Strain: Low Risk  (03/10/2023)   Overall Financial Resource Strain (CARDIA)    Difficulty of Paying Living Expenses: Not hard at all  Food Insecurity: No Food Insecurity (03/10/2023)   Hunger Vital Sign    Worried About Running Out of Food in the Last Year: Never true    Ran Out of Food in the Last Year: Never true  Transportation Needs: Patient Declined (03/10/2023)   PRAPARE - Transportation    Lack of Transportation (Medical): Patient declined    Lack of Transportation (Non-Medical): Patient declined  Physical Activity: Insufficiently Active (03/10/2023)   Exercise Vital Sign    Days of Exercise per Week: 2 days    Minutes of Exercise per Session: 20 min  Stress: Stress Concern Present (03/10/2023)   [name] of Occupational Health - Occupational Stress Questionnaire    Feeling of Stress : To some extent  Social Connections: Moderately Integrated (03/10/2023)   Social Connection and Isolation Panel [NHANES]    Frequency of Communication with Friends and Family: More than three times a week    Frequency of Social Gatherings with Friends and Family: More than three times a week    Attends Religious Services: More than 4 times per year    Active Member of [employer] or Organizations: Yes    Attends Banker Meetings: More than 4 times per year    Marital Status: Widowed  Intimate Partner Violence: Not At Risk (08/19/2022)   Humiliation, Afraid, Rape, and Kick questionnaire    Fear of Current or Ex-Partner: No    Emotionally Abused: No    Physically Abused: No    Sexually Abused: No     Review of Systems: General: negative for chills,  fever, night sweats or weight changes.  Cardiovascular: negative for chest pain, dyspnea on exertion, edema, orthopnea, palpitations, paroxysmal nocturnal dyspnea or shortness of breath Dermatological: negative for rash Respiratory: negative for cough or wheezing Urologic: negative for hematuria Abdominal: negative for nausea, vomiting, diarrhea, bright red blood per rectum, melena, or hematemesis Neurologic: negative for visual changes, syncope, or dizziness All other systems reviewed and are otherwise negative except as noted above.    Blood pressure 130/62, pulse 77, height 4' 10.75" (1.492 m), weight 144 lb 3.2 oz (65.4 kg), SpO2 97%.  General appearance: alert and no distress Neck: no adenopathy, no JVD, supple, symmetrical, trachea midline, thyroid not enlarged, symmetric, no tenderness/mass/nodules, and right carotid bruit Lungs:  clear to auscultation bilaterally Heart: regular rate and rhythm, S1, S2 normal, no murmur, click, rub or gallop Extremities: extremities normal, atraumatic, no cyanosis or edema Pulses: 2+ and symmetric Skin: Skin color, texture, turgor normal. No rashes or lesions Neurologic: Grossly normal  EKG not performed today      ASSESSMENT AND PLAN:   Renal artery stenosis (HCC) Ms. Cerros is being seen for evaluation of renal artery stenosis.  She was sent to me by Perlie Gold, PA-C and is a cardiology patient of Dr. Ival Bible.  She did have renal Dopplers performed 11/24/2022 revealing renal aortic ratio on the right of 4.5 and 3.9 on the left with preserved renal dimensions.  As result of this I did get a abdominal CTA that did confirm bilateral renal artery stenosis.  Her blood pressure today is 130/62 and is well-controlled on amlodipine, hydrochlorothiazide and valsartan.  Her serum creatinine is 1.  This point, there is no indication for renal intervention.  Should her blood pressure become more difficult to control and/or if her renal function starts to  deteriorate she would be a candidate for renal artery intervention.  We will recheck renal Doppler studies in October and I will see her back in 1 year for follow-up.     Runell Gess MD FACP,FACC,FAHA, [facility] 03/19/2023 4:20 PM

## 2023-03-19 NOTE — Assessment & Plan Note (Signed)
Amy [name] is being seen for evaluation of renal artery stenosis.  She was sent to me by Perlie Gold, PA-C and is a cardiology patient of Dr. Ival Bible.  She did have renal Dopplers performed 11/24/2022 revealing renal aortic ratio on the right of 4.5 and 3.9 on the left with preserved renal dimensions.  As result of this I did get a abdominal CTA that did confirm bilateral renal artery stenosis.  Her blood pressure today is 130/62 and is well-controlled on amlodipine, hydrochlorothiazide and valsartan.  Her serum creatinine is 1.  This point, there is no indication for renal intervention.  Should her blood pressure become more difficult to control and/or if her renal function starts to deteriorate she would be a candidate for renal artery intervention.  We will recheck renal Doppler studies in October and I will see her back in 1 year for follow-up.

## 2023-03-19 NOTE — Patient Instructions (Addendum)
Medication Instructions:  Your physician recommends that you continue on your current medications as directed. Please refer to the Current Medication list given to you today.  *If you need a refill on your cardiac medications before your next appointment, please call your pharmacy*   Testing/Procedures: Your physician has requested that you have a carotid duplex. This test is an ultrasound of the carotid arteries in your neck. It looks at blood flow through these arteries that supply the brain with blood. Allow one hour for this exam. There are no restrictions or special instructions. **To be done in November**   Please note: We ask at that you not bring children with you during ultrasound (echo/ vascular) testing. Due to room size and safety concerns, children are not allowed in the ultrasound rooms during exams. Our front office staff cannot provide observation of children in our lobby area while testing is being conducted. An adult accompanying a patient to their appointment will only be allowed in the ultrasound room at the discretion of the ultrasound technician under special circumstances. We apologize for any inconvenience.   Your physician has requested that you have a renal artery duplex. During this test, an ultrasound is used to evaluate blood flow to the kidneys. Take your medications as you usually do.   No food after 11PM the night before.  Water is OK. (Don't drink liquids if you have been instructed not to for ANOTHER test). Avoid foods that produce bowel gas, for 24 hours prior to exam (see below). No breakfast, no chewing gum, no smoking or carbonated beverages. Patient may take morning medications with water. Come in for test at least 15 minutes early to register. **To be done in November**  Please note: We ask at that you not bring children with you during ultrasound (echo/ vascular) testing. Due to room size and safety concerns, children are not allowed in the ultrasound  rooms during exams. Our front office staff cannot provide observation of children in our lobby area while testing is being conducted. An adult accompanying a patient to their appointment will only be allowed in the ultrasound room at the discretion of the ultrasound technician under special circumstances. We apologize for any inconvenience.   Follow-Up: At [facility], you and your health needs are our priority.  As part of our continuing mission to provide you with exceptional heart care, we have created designated Provider Care Teams.  These Care Teams include your primary Cardiologist (physician) and Advanced Practice Providers (APPs -  Physician Assistants and Nurse Practitioners) who all work together to provide you with the care you need, when you need it.  We recommend signing up for the patient portal called "MyChart".  Sign up information is provided on this After Visit Summary.  MyChart is used to connect with patients for Virtual Visits (Telemedicine).  Patients are able to view lab/test results, encounter notes, upcoming appointments, etc.  Non-urgent messages can be sent to your provider as well.   To learn more about what you can do with MyChart, go to [email or web address].    Your next appointment:   12 month(s)  Provider:   Nanetta Batty, MD   Other Instructions

## 2023-04-01 ENCOUNTER — Other Ambulatory Visit: Payer: Self-pay | Admitting: Internal Medicine

## 2023-04-01 DIAGNOSIS — I10 Essential (primary) hypertension: Secondary | ICD-10-CM

## 2023-04-02 ENCOUNTER — Other Ambulatory Visit: Payer: Medicare Other

## 2023-04-02 ENCOUNTER — Inpatient Hospital Stay: Payer: Medicare Other | Attending: Hematology

## 2023-04-02 ENCOUNTER — Ambulatory Visit (HOSPITAL_COMMUNITY)
Admission: RE | Admit: 2023-04-02 | Discharge: 2023-04-02 | Disposition: A | Discharge status: Home / Self Care | Payer: Medicare Other | Source: Ambulatory Visit | Attending: Hematology | Admitting: Hematology

## 2023-04-02 DIAGNOSIS — K7689 Other specified diseases of liver: Secondary | ICD-10-CM | POA: Diagnosis not present

## 2023-04-02 DIAGNOSIS — R161 Splenomegaly, not elsewhere classified: Secondary | ICD-10-CM | POA: Insufficient documentation

## 2023-04-02 DIAGNOSIS — K5730 Diverticulosis of large intestine without perforation or abscess without bleeding: Secondary | ICD-10-CM | POA: Insufficient documentation

## 2023-04-02 LAB — COMPREHENSIVE METABOLIC PANEL
ALT: 18 U/L (ref 0–44)
AST: 20 U/L (ref 15–41)
Albumin: 3.9 g/dL (ref 3.5–5.0)
Alkaline Phosphatase: 89 U/L (ref 38–126)
Anion gap: 10 (ref 5–15)
BUN: 23 mg/dL (ref 8–23)
CO2: 25 mmol/L (ref 22–32)
Calcium: 9 mg/dL (ref 8.9–10.3)
Chloride: 102 mmol/L (ref 98–111)
Creatinine, Ser: 0.99 mg/dL (ref 0.44–1.00)
GFR, Estimated: 58 mL/min — ABNORMAL LOW (ref >60)
Glucose, Bld: 91 mg/dL (ref 70–99)
Potassium: 3.9 mmol/L (ref 3.5–5.1)
Sodium: 137 mmol/L (ref 135–145)
Total Bilirubin: 1.4 mg/dL — ABNORMAL HIGH (ref 0.0–1.2)
Total Protein: 6.6 g/dL (ref 6.5–8.1)

## 2023-04-02 LAB — CBC WITH DIFFERENTIAL/PLATELET
Abs Immature Granulocytes: 0.01 10*3/uL (ref 0.00–0.07)
Basophils Absolute: 0 10*3/uL (ref 0.0–0.1)
Basophils Relative: 1 %
Eosinophils Absolute: 0.2 10*3/uL (ref 0.0–0.5)
Eosinophils Relative: 4 %
HCT: 32.7 % — ABNORMAL LOW (ref 36.0–46.0)
Hemoglobin: 10.9 g/dL — ABNORMAL LOW (ref 12.0–15.0)
Immature Granulocytes: 0 %
Lymphocytes Relative: 31 %
Lymphs Abs: 1.7 10*3/uL (ref 0.7–4.0)
MCH: 30.2 pg (ref 26.0–34.0)
MCHC: 33.3 g/dL (ref 30.0–36.0)
MCV: 90.6 fL (ref 80.0–100.0)
Monocytes Absolute: 0.5 10*3/uL (ref 0.1–1.0)
Monocytes Relative: 9 %
Neutro Abs: 3.1 10*3/uL (ref 1.7–7.7)
Neutrophils Relative %: 55 %
Platelets: 147 10*3/uL — ABNORMAL LOW (ref 150–400)
RBC: 3.61 MIL/uL — ABNORMAL LOW (ref 3.87–5.11)
RDW: 13.5 % (ref 11.5–15.5)
WBC: 5.6 10*3/uL (ref 4.0–10.5)
nRBC: 0 % (ref 0.0–0.2)

## 2023-04-02 LAB — LACTATE DEHYDROGENASE: LDH: 135 U/L (ref 98–192)

## 2023-04-02 MED ORDER — IOHEXOL 300 MG/ML  SOLN
100.0000 mL | Freq: Once | INTRAMUSCULAR | Status: AC | PRN
  Administered 2023-04-02: 100 mL via INTRAVENOUS

## 2023-04-10 ENCOUNTER — Inpatient Hospital Stay: Payer: Medicare Other | Attending: Hematology | Admitting: Hematology

## 2023-04-10 ENCOUNTER — Encounter: Payer: Self-pay | Admitting: Hematology

## 2023-04-10 VITALS — BP 132/73 | HR 63 | Temp 97.3°F | Resp 18 | Wt 145.5 lb

## 2023-04-10 DIAGNOSIS — I129 Hypertensive chronic kidney disease with stage 1 through stage 4 chronic kidney disease, or unspecified chronic kidney disease: Secondary | ICD-10-CM | POA: Insufficient documentation

## 2023-04-10 DIAGNOSIS — R161 Splenomegaly, not elsewhere classified: Secondary | ICD-10-CM | POA: Diagnosis not present

## 2023-04-10 DIAGNOSIS — Z8041 Family history of malignant neoplasm of ovary: Secondary | ICD-10-CM | POA: Diagnosis not present

## 2023-04-10 DIAGNOSIS — D509 Iron deficiency anemia, unspecified: Secondary | ICD-10-CM | POA: Insufficient documentation

## 2023-04-10 DIAGNOSIS — N189 Chronic kidney disease, unspecified: Secondary | ICD-10-CM | POA: Diagnosis not present

## 2023-04-10 NOTE — Progress Notes (Signed)
 [facility] [address] [place] [address]   Clinic Day:  04/10/23    Referring physician: Billie Lade, MD  Patient Care Team: Billie Lade, MD as PCP - General (Internal Medicine) Jonelle Sidle, MD as PCP - Cardiology (Cardiology) Marinus Maw, MD as PCP - Electrophysiology (Cardiology) Center, [facility] as Attending Physician Doreatha Massed, MD as Medical Oncologist (Hematology)   ASSESSMENT & PLAN:   Assessment:  1.  Splenomegaly: - Patient seen at the request of Dr. Durwin Nora for splenomegaly. - Ultrasound (11/2021): Suggestive of splenomegaly. - CT abdomen (12/10/2021): Splenomegaly 18 cm in craniocaudal dimension.  Subcentimeter hypodensity in segment 4 poorly visualized. - Ultrasound RUQ (07/25/2022): No liver mass visualized. - Denies any B symptoms or recurrent infections.  She reports mild day sweats for the past 1 year. - PET scan on 09/26/2022: Splenomegaly without hypermetabolism.  Prominent bilateral axillary and left external iliac lymph nodes with SUV at 1.7.  2.  Social/family history: - She lives by herself and is independent of ADLs and IADLs.  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  She now works at [employer] for [employer].  Non-smoker.  No chemical exposure. - No family history of hepatosplenomegaly.  Maternal great grandmother had ovarian cancer.  Plan:  1.  Splenomegaly: - She denies any fevers, night sweats or weight loss.  Reports some early satiety but is able to eat full portions and has maintained her weight. - Physical exam: Spleen lower and palpable 6 fingerbreadths below the left costal margin on deep inspiration. - Labs from 04/02/2023: LDH normal.  LFTs are normal.  CBC with normal white count and mild thrombocytopenia of 147. - CT [facility] (04/02/2023): Stable splenomegaly measuring 18 cm craniocaudally.  No adenopathy. - She had stable splenomegaly dating back to November 2023. - We discussed  options of splenectomy versus observation as it is not causing any symptoms at this time.  Differential diagnosis including low-grade lymphoma was discussed.  We agreed for observation.  Will see her back in 6 months with repeat labs and exam.  Will plan on imaging in 1 year.  2.  Normocytic anemia from mild CKD and iron deficiency: - Ferritin is 38 and percent saturation 11.  Hemoglobin 10.9. - Discussed the role of parenteral iron therapy with Monoferric.  Discussed side effects.  Will schedule her for 1 infusion.  Will repeat ferritin and iron panel in 6 months.   Orders Placed This Encounter  Procedures   CBC with Differential    Standing Status:   Future    Expected Date:   10/06/2023    Expiration Date:   04/09/2024   Comprehensive metabolic panel    Standing Status:   Future    Expected Date:   10/06/2023    Expiration Date:   04/09/2024   Lactate dehydrogenase    Standing Status:   Future    Expected Date:   10/06/2023    Expiration Date:   04/09/2024   Iron and TIBC ([facility] ONLY)    Standing Status:   Future    Expected Date:   10/06/2023    Expiration Date:   04/09/2024   Ferritin    Standing Status:   Future    Expected Date:   10/06/2023    Expiration Date:   04/09/2024      Mikeal Hawthorne R Teague,acting as a scribe for Doreatha Massed, MD.,have documented all relevant documentation on the behalf of Doreatha Massed, MD,as  directed by  Doreatha Massed, MD while in the presence of Doreatha Massed, MD.  I, Doreatha Massed MD, have reviewed the above documentation for accuracy and completeness, and I agree with the above.     Doreatha Massed, MD   3/6/202512:56 PM  CHIEF COMPLAINT/PURPOSE OF CONSULT:   Diagnosis: Splenomegaly   Cancer Staging  No matching staging information was found for the patient.    Prior Therapy: None  Current Therapy: Under workup   HISTORY OF PRESENT ILLNESS:   Amy [name] is a 78 y.o. female presenting to clinic  today for evaluation of an enlarged spleen at the request of Dr. Levon Hedger.  Patient had an [place] in October 2023 that showed an 18 cm spleen.   Patient underwent an US of the abdomen limited to the RUQ on 6/28 that showed the known mass in the hepatic dome is not visualized on today's study. The mass is low suspicion on CT imaging.  Her recent CBC on 05/21/22 showed abnormal ALC at 13, L at 26%, and G at 66%.   Today, she states that she is doing well overall. Her appetite level is at 100%. Her energy level is at 80%. She is accompanied by her daughter.   She reports she has had multiple scans that showed an enlarged spleen. Patient reports she has a pacemaker, so she could not have an MRI.   She notes she had anemia and a hysterectomy. Her mother had anemia. She notes her maternal great-great grandmother had ovarian cancer. She denies family history of hereditary splenocytosis. She reports maternal and paternal family members have died from heart diseases or strokes.   She reports of feeling full after eating and bloating. She denies fever, night sweats, weight loss, recurrent infections. She denies ever having infectious mononucleosis.   She had a stomach virus which caused a high fever over 2 years ago. She notes she fell on a metal trash can 4 years ago to her right side. She had to receive physical therapy and could not walk at first. She reportedly broke her ribs.   She currently works at [employer] for [employer]. She used to do office work. She is able to do activities around the house without assistance. She denies any chemical exposure, smoking, or consuming alcohol.   INTERVAL HISTORY:   Amy [name] is a 78 y.o. female presenting to the clinic today for follow-up of Splenomegaly. She was last seen by me on 10/10/22.  Since her last visit, she underwent CT A/P on 04/02/23 that found: Stable moderate to severe splenomegaly. No evidence of soft tissue mass or lymphadenopathy. Colonic  diverticulosis, without radiographic evidence of diverticulitis.  Today, she states that she is doing well overall. Her appetite level is at 100%. Her energy level is at 75%. She is accompanied by family. She denies any fevers, night sweats, unintentional weight loss, or infections in the last 6 months. Chandrea notes early satiety, though she has normal sized meals 3 x a day.   Larisa states she has a reaction to sulfur antibiotics in the form of a whole body rash.   PAST MEDICAL HISTORY:   Past Medical History: Past Medical History:  Diagnosis Date   Asthma    CAD (coronary artery disease)    60-70% proximal LAD/diagonal bifurcational disease October 2015 at [facility] status DES x 2 to LAD/diagonal October 2015   Essential hypertension    Heart murmur    Hyperlipidemia    Hypothyroidism    Sinus node dysfunction (  HCC)    Biotronik pacemaker in place    Surgical History: Past Surgical History:  Procedure Laterality Date   ABDOMINAL HYSTERECTOMY     ANAL FISSURE REPAIR     CARDIAC CATHETERIZATION     CATARACT EXTRACTION W/PHACO Right 10/17/2014   Procedure: CATARACT EXTRACTION PHACO AND INTRAOCULAR LENS PLACEMENT (IOC);  Surgeon: Gemma Payor, MD;  Location: [facility] ORS;  Service: Ophthalmology;  Laterality: Right;  CDE: 4.88   CATARACT EXTRACTION W/PHACO Left 10/27/2014   Procedure: CATARACT EXTRACTION PHACO AND INTRAOCULAR LENS PLACEMENT (IOC);  Surgeon: Gemma Payor, MD;  Location: [facility] ORS;  Service: Ophthalmology;  Laterality: Left;  CDE:7.86   COLONOSCOPY N/A 04/16/2018   Procedure: COLONOSCOPY;  Surgeon: Malissa Hippo, MD;  Location: [facility] ENDO SUITE;  Service: Endoscopy;  Laterality: N/A;  830   FINGER SURGERY     INSERT / REPLACE / REMOVE PACEMAKER     NASAL SINUS SURGERY     PACEMAKER IMPLANT N/A 07/30/2016   Procedure: Pacemaker Implant Dual Chamber;  Surgeon: Marinus Maw, MD;  Location: [facility] INVASIVE CV LAB;  Service: Cardiovascular;  Laterality: N/A;   TONSILLECTOMY      Social  History: Social History   Socioeconomic History   Marital status: Widowed    Spouse name: Not on file   Number of children: Not on file   Years of education: Not on file   Highest education level: Not on file  Occupational History   Not on file  Tobacco Use   Smoking status: Never   Smokeless tobacco: Never  Vaping Use   Vaping status: Never Used  Substance and Sexual Activity   Alcohol use: No   Drug use: No   Sexual activity: Not Currently    Birth control/protection: None  Other Topics Concern   Not on file  Social History Narrative   Not on file   Social Drivers of Health   Financial Resource Strain: Low Risk  (03/10/2023)   Overall Financial Resource Strain (CARDIA)    Difficulty of Paying Living Expenses: Not hard at all  Food Insecurity: No Food Insecurity (03/10/2023)   Hunger Vital Sign    Worried About Running Out of Food in the Last Year: Never true    Ran Out of Food in the Last Year: Never true  Transportation Needs: Patient Declined (03/10/2023)   PRAPARE - Transportation    Lack of Transportation (Medical): Patient declined    Lack of Transportation (Non-Medical): Patient declined  Physical Activity: Insufficiently Active (03/10/2023)   Exercise Vital Sign    Days of Exercise per Week: 2 days    Minutes of Exercise per Session: 20 min  Stress: Stress Concern Present (03/10/2023)   [name] of Occupational Health - Occupational Stress Questionnaire    Feeling of Stress : To some extent  Social Connections: Moderately Integrated (03/10/2023)   Social Connection and Isolation Panel [NHANES]    Frequency of Communication with Friends and Family: More than three times a week    Frequency of Social Gatherings with Friends and Family: More than three times a week    Attends Religious Services: More than 4 times per year    Active Member of [employer] or Organizations: Yes    Attends Banker Meetings: More than 4 times per year    Marital Status:  Widowed  Intimate Partner Violence: Not At Risk (08/19/2022)   Humiliation, Afraid, Rape, and Kick questionnaire    Fear of Current or Ex-Partner: No  Emotionally Abused: No    Physically Abused: No    Sexually Abused: No    Family History: Family History  Problem Relation Age of Onset   Heart disease Mother    Parkinson's disease Mother    Heart disease Father     Current Medications:  Current Outpatient Medications:    amLODipine (NORVASC) 10 MG tablet, Take 1 tablet (10 mg total) by mouth every evening., Disp: 90 tablet, Rfl: 3   aspirin 81 MG tablet, Take 81 mg by mouth daily., Disp: , Rfl:    atorvastatin (LIPITOR) 20 MG tablet, Take 20 mg by mouth at bedtime. , Disp: , Rfl: 3   betamethasone valerate ointment (VALISONE) 0.1 %, APPLY TO THE AFFECTED AREA(S) TWICE DAILY, Disp: 45 g, Rfl: 2   calcium carbonate (OSCAL) 1500 (600 Ca) MG TABS tablet, Take 1,500 mg by mouth daily with breakfast., Disp: , Rfl:    Cholecalciferol (VITAMIN D3) 2000 units capsule, Take 2,000 Units by mouth daily at 12 noon. , Disp: , Rfl:    clobetasol (TEMOVATE) 0.05 % external solution, Apply 1 application  topically as needed (psoriasis)., Disp: , Rfl:    clopidogrel (PLAVIX) 75 MG tablet, TAKE 1 TABLET BY MOUTH DAILY, Disp: 90 tablet, Rfl: 1   hydrochlorothiazide (HYDRODIURIL) 12.5 MG tablet, TAKE 1 TABLET BY MOUTH EVERY DAY HIGH BLOOD PRESSURE, Disp: 90 tablet, Rfl: 3   Iron, Ferrous Sulfate, 325 (65 Fe) MG TABS, Take 325 mg by mouth daily., Disp: 30 tablet, Rfl: 3   isosorbide mononitrate (IMDUR) 60 MG 24 hr tablet, TAKE 1 TABLET BY MOUTH DAILY, Disp: 30 tablet, Rfl: 2   levothyroxine (SYNTHROID) 75 MCG tablet, Take 75 mcg by mouth every morning., Disp: , Rfl:    magnesium oxide (MAG-OX) 400 (240 Mg) MG tablet, Take 0.5 tablets by mouth daily., Disp: , Rfl:    Multiple Vitamin (MULTIVITAMIN WITH MINERALS) TABS tablet, Take 1 tablet by mouth daily at 12 noon. , Disp: , Rfl:    pantoprazole  (PROTONIX) 40 MG tablet, Take 1 tablet (40 mg total) by mouth daily., Disp: 90 tablet, Rfl: 3   valsartan (DIOVAN) 160 MG tablet, Take 1 tablet (160 mg total) by mouth daily., Disp: , Rfl:    Allergies: Allergies  Allergen Reactions   Sulfa Antibiotics Rash    REVIEW OF SYSTEMS:   Review of Systems  Constitutional:  Negative for chills, fatigue and fever.  HENT:   Negative for lump/mass, mouth sores, nosebleeds, sore throat and trouble swallowing.   Eyes:  Negative for eye problems.  Respiratory:  Positive for shortness of breath (with exertion). Negative for cough.   Cardiovascular:  Negative for chest pain, leg swelling and palpitations.  Gastrointestinal:  Positive for abdominal pain (occasional). Negative for constipation, diarrhea, nausea and vomiting.  Genitourinary:  Negative for bladder incontinence, difficulty urinating, dysuria, frequency, hematuria and nocturia.   Musculoskeletal:  Negative for arthralgias, back pain, flank pain, myalgias and neck pain.  Skin:  Positive for rash (psoriasis). Negative for itching.  Neurological:  Positive for headaches (occasional). Negative for dizziness and numbness.       +off balance  Hematological:  Does not bruise/bleed easily.  Psychiatric/Behavioral:  Negative for depression, sleep disturbance and suicidal ideas. The patient is not nervous/anxious.   All other systems reviewed and are negative.    VITALS:   Blood pressure 132/73, pulse 63, temperature (!) 97.3 F (36.3 C), resp. rate 18, weight 145 lb 8.1 oz (66 kg).  Wt Readings from  Last 3 Encounters:  04/10/23 145 lb 8.1 oz (66 kg)  03/19/23 144 lb 3.2 oz (65.4 kg)  03/18/23 143 lb 6.4 oz (65 kg)    Body mass index is 29.64 kg/m.  Performance status (ECOG): 1 - Symptomatic but completely ambulatory  PHYSICAL EXAM:   Physical Exam Vitals and nursing note reviewed. Exam conducted with a chaperone present.  Constitutional:      Appearance: Normal appearance.   Cardiovascular:     Rate and Rhythm: Normal rate and regular rhythm.     Pulses: Normal pulses.     Heart sounds: Normal heart sounds.  Pulmonary:     Effort: Pulmonary effort is normal.     Breath sounds: Normal breath sounds.  Abdominal:     Palpations: Abdomen is soft. There is splenomegaly (6 fingerbreadths below the rib margin palpable on deep inspiration). There is no hepatomegaly or mass.     Tenderness: There is no abdominal tenderness.  Musculoskeletal:     Right lower leg: No edema.     Left lower leg: No edema.  Lymphadenopathy:     Cervical: No cervical adenopathy.     Right cervical: No superficial, deep or posterior cervical adenopathy.    Left cervical: No superficial, deep or posterior cervical adenopathy.     Upper Body:     Right upper body: No supraclavicular or axillary adenopathy.     Left upper body: No supraclavicular or axillary adenopathy.  Neurological:     General: No focal deficit present.     Mental Status: She is alert and oriented to person, place, and time.  Psychiatric:        Mood and Affect: Mood normal.        Behavior: Behavior normal.     LABS:      Latest Ref Rng & Units 04/02/2023   11:50 AM 03/17/2023    4:53 PM 12/06/2022   11:42 AM  CBC  WBC 4.0 - 10.5 K/uL 5.6  6.1  5.3   Hemoglobin 12.0 - 15.0 g/dL 16.1  09.6  04.5   Hematocrit 36.0 - 46.0 % 32.7  31.1  34.7   Platelets 150 - 400 K/uL 147  169  134       Latest Ref Rng & Units 04/02/2023   11:50 AM 03/17/2023    4:53 PM 12/06/2022   11:42 AM  CMP  Glucose 70 - 99 mg/dL 91  98  409   BUN 8 - 23 mg/dL 23  23  16    Creatinine 0.44 - 1.00 mg/dL 8.11  9.14  7.82   Sodium 135 - 145 mmol/L 137  139  131   Potassium 3.5 - 5.1 mmol/L 3.9  3.9  4.2   Chloride 98 - 111 mmol/L 102  101  99   CO2 22 - 32 mmol/L 25  23  25    Calcium 8.9 - 10.3 mg/dL 9.0  8.8  9.0   Total Protein 6.5 - 8.1 g/dL 6.6  6.2  6.2   Total Bilirubin 0.0 - 1.2 mg/dL 1.4  1.1  1.9   Alkaline Phos 38 - 126 U/L  89  121  86   AST 15 - 41 U/L 20  19  23    ALT 0 - 44 U/L 18  14  18       No results found for: "CEA1", "CEA" / No results found for: "CEA1", "CEA" No results found for: "PSA1" No results found for: "NFA213" No results found for: "  UJW119"  No results found for: "TOTALPROTELP", "ALBUMINELP", "A1GS", "A2GS", "BETS", "BETA2SER", "GAMS", "MSPIKE", "SPEI" Lab Results  Component Value Date   TIBC 349 03/17/2023   FERRITIN 38 03/17/2023   IRONPCTSAT 11 (L) 03/17/2023   Lab Results  Component Value Date   LDH 135 04/02/2023   LDH 136 08/16/2022     STUDIES:   CT ABDOMEN PELVIS W CONTRAST Result Date: 04/09/2023 CLINICAL DATA:  Splenomegaly. EXAM: CT ABDOMEN AND PELVIS WITH CONTRAST TECHNIQUE: Multidetector CT imaging of the abdomen and pelvis was performed using the standard protocol following bolus administration of intravenous contrast. RADIATION DOSE REDUCTION: This exam was performed according to the departmental dose-optimization program which includes automated exposure control, adjustment of the mA and/or kV according to patient size and/or use of iterative reconstruction technique. CONTRAST:  OMNIPAQUE IOHEXOL 300 MG/ML  SOLN COMPARISON:  12/07/2022 FINDINGS: Lower Chest: No acute findings. Hepatobiliary: No suspicious hepatic masses identified. Stable small left hepatic lobe cyst. Gallbladder is unremarkable. No evidence of biliary ductal dilatation. Pancreas:  No mass or inflammatory changes. Spleen: Moderate to severe splenomegaly again seen with length measuring approximately 18 cm. No splenic masses identified. Adrenals/Urinary Tract: No suspicious masses identified. No evidence of ureteral calculi or hydronephrosis. Stomach/Bowel: No evidence of obstruction, inflammatory process or abnormal fluid collections. Diverticulosis is seen mainly involving the descending and sigmoid colon, however there is no evidence of diverticulitis. Vascular/Lymphatic: Stable sub-centimeter lymph  nodes in the abdominal retroperitoneum and bilateral iliac chains. No pathologically enlarged lymph nodes identified. No acute vascular findings. Reproductive: Prior hysterectomy noted. Adnexal regions are unremarkable in appearance. Other:  None. Musculoskeletal:  No suspicious bone lesions identified. IMPRESSION: Stable moderate to severe splenomegaly. No evidence of soft tissue mass or lymphadenopathy. Colonic diverticulosis, without radiographic evidence of diverticulitis. Electronically Signed   By: Danae Orleans M.D.   On: 04/09/2023 12:19

## 2023-04-10 NOTE — Patient Instructions (Signed)
 [facility] at [facility] Discharge Instructions   You were seen and examined today by Dr. Ellin Saba.  He reviewed the results of your lab work which are normal/stable.   He reviewed the results of your CT scan which shows the known enlargement of the spleen. It has not grown in size.   We will see you back in 6 months. We will repeat lab work at that time.   Return as scheduled.    Thank you for choosing  Cancer Center at [facility] to provide your oncology and hematology care.  To afford each patient quality time with our provider, please arrive at least 15 minutes before your scheduled appointment time.   If you have a lab appointment with the Cancer Center please come in thru the Main Entrance and check in at the main information desk.  You need to re-schedule your appointment should you arrive 10 or more minutes late.  We strive to give you quality time with our providers, and arriving late affects you and other patients whose appointments are after yours.  Also, if you no show three or more times for appointments you may be dismissed from the clinic at the providers discretion.     Again, thank you for choosing [facility].  Our hope is that these requests will decrease the amount of time that you wait before being seen by our physicians.       _____________________________________________________________  Should you have questions after your visit to [facility], please contact our office at [phone] and follow the prompts.  Our office hours are 8:00 a.m. and 4:30 p.m. Monday - Friday.  Please note that voicemails left after 4:00 p.m. may not be returned until the following business day.  We are closed weekends and major holidays.  You do have access to a nurse 24-7, just call the main number to the clinic [phone] and do not press any options, hold on the line and a nurse will answer the phone.    For  prescription refill requests, have your pharmacy contact our office and allow 72 hours.    Due to Covid, you will need to wear a mask upon entering the hospital. If you do not have a mask, a mask will be given to you at the Main Entrance upon arrival. For doctor visits, patients may have 1 support person age 20 or older with them. For treatment visits, patients can not have anyone with them due to social distancing guidelines and our immunocompromised population.

## 2023-04-11 ENCOUNTER — Telehealth: Payer: Self-pay | Admitting: Cardiology

## 2023-04-11 NOTE — Telephone Encounter (Signed)
 P{t came into [place] office in regards to her BP. She said she has an appt with Gillian Shields and she does not think it is needed. Her blood pressure is under control  and has been for a couple months. She is wandering if [facility] thinks she should go to the appointment or if we can cancel it!

## 2023-04-11 NOTE — Telephone Encounter (Signed)
 I spoke with daughter and they will follow up with Gillian Shields as scheduled and follow her recommendations with follow up

## 2023-04-17 ENCOUNTER — Ambulatory Visit

## 2023-04-18 ENCOUNTER — Inpatient Hospital Stay

## 2023-04-18 VITALS — BP 152/51 | HR 64 | Temp 97.4°F | Resp 18

## 2023-04-18 DIAGNOSIS — I129 Hypertensive chronic kidney disease with stage 1 through stage 4 chronic kidney disease, or unspecified chronic kidney disease: Secondary | ICD-10-CM | POA: Diagnosis not present

## 2023-04-18 DIAGNOSIS — D509 Iron deficiency anemia, unspecified: Secondary | ICD-10-CM | POA: Diagnosis not present

## 2023-04-18 DIAGNOSIS — R161 Splenomegaly, not elsewhere classified: Secondary | ICD-10-CM | POA: Diagnosis not present

## 2023-04-18 DIAGNOSIS — Z8041 Family history of malignant neoplasm of ovary: Secondary | ICD-10-CM | POA: Diagnosis not present

## 2023-04-18 DIAGNOSIS — N189 Chronic kidney disease, unspecified: Secondary | ICD-10-CM | POA: Diagnosis not present

## 2023-04-18 MED ORDER — ACETAMINOPHEN 325 MG PO TABS
650.0000 mg | ORAL_TABLET | Freq: Once | ORAL | Status: AC
  Administered 2023-04-18: 650 mg via ORAL
  Filled 2023-04-18: qty 2

## 2023-04-18 MED ORDER — CETIRIZINE HCL 10 MG/ML IV SOLN
10.0000 mg | Freq: Once | INTRAVENOUS | Status: AC
  Administered 2023-04-18: 10 mg via INTRAVENOUS
  Filled 2023-04-18: qty 1

## 2023-04-18 MED ORDER — SODIUM CHLORIDE 0.9 % IV SOLN
1000.0000 mg | Freq: Once | INTRAVENOUS | Status: AC
  Administered 2023-04-18: 1000 mg via INTRAVENOUS
  Filled 2023-04-18: qty 10

## 2023-04-18 MED ORDER — SODIUM CHLORIDE 0.9 % IV SOLN: INTRAVENOUS | Status: DC

## 2023-04-18 NOTE — Patient Instructions (Signed)
 [facility] [place] - A DEPT OF [facility]  Discharge Instructions: Thank you for choosing [facility] to provide your oncology and hematology care.  If you have a lab appointment with the Cancer Center - please note that after April 8th, 2024, all labs will be drawn in the cancer center.  You do not have to check in or register with the main entrance as you have in the past but will complete your check-in in the cancer center.  Wear comfortable clothing and clothing appropriate for easy access to any Portacath or PICC line.   We strive to give you quality time with your provider. You may need to reschedule your appointment if you arrive late (15 or more minutes).  Arriving late affects you and other patients whose appointments are after yours.  Also, if you miss three or more appointments without notifying the office, you may be dismissed from the clinic at the provider's discretion.      For prescription refill requests, have your pharmacy contact our office and allow 72 hours for refills to be completed.    Today you received the following Monoferric, return as scheduled.    To help prevent nausea and vomiting after your treatment, we encourage you to take your nausea medication as directed.  BELOW ARE SYMPTOMS THAT SHOULD BE REPORTED IMMEDIATELY: *FEVER GREATER THAN 100.4 F (38 C) OR HIGHER *CHILLS OR SWEATING *NAUSEA AND VOMITING THAT IS NOT CONTROLLED WITH YOUR NAUSEA MEDICATION *UNUSUAL SHORTNESS OF BREATH *UNUSUAL BRUISING OR BLEEDING *URINARY PROBLEMS (pain or burning when urinating, or frequent urination) *BOWEL PROBLEMS (unusual diarrhea, constipation, pain near the anus) TENDERNESS IN MOUTH AND THROAT WITH OR WITHOUT PRESENCE OF ULCERS (sore throat, sores in mouth, or a toothache) UNUSUAL RASH, SWELLING OR PAIN  UNUSUAL VAGINAL DISCHARGE OR ITCHING   Items with * indicate a potential emergency and should be followed up as soon as possible  or go to the Emergency Department if any problems should occur.  Please show the CHEMOTHERAPY ALERT CARD or IMMUNOTHERAPY ALERT CARD at check-in to the Emergency Department and triage nurse.  Should you have questions after your visit or need to cancel or reschedule your appointment, please contact [facility] [place] - A DEPT OF [facility] [phone]  and follow the prompts.  Office hours are 8:00 a.m. to 4:30 p.m. Monday - Friday. Please note that voicemails left after 4:00 p.m. may not be returned until the following business day.  We are closed weekends and major holidays. You have access to a nurse at all times for urgent questions. Please call the main number to the clinic [phone] and follow the prompts.  For any non-urgent questions, you may also contact your provider using MyChart. We now offer e-Visits for anyone 26 and older to request care online for non-urgent symptoms. For details visit [email or web address].   Also download the MyChart app! Go to the app store, search "MyChart", open the app, select [facility], and log in with your MyChart username and password.

## 2023-04-18 NOTE — Progress Notes (Signed)
 Patient tolerated iron infusion with no complaints voiced.  Peripheral IV site clean and dry with good blood return noted before and after infusion.  Band aid applied.  VSS with discharge and left in satisfactory condition with no s/s of distress noted.

## 2023-05-01 ENCOUNTER — Ambulatory Visit (HOSPITAL_BASED_OUTPATIENT_CLINIC_OR_DEPARTMENT_OTHER): Payer: Medicare Other | Admitting: Family

## 2023-05-01 ENCOUNTER — Encounter (HOSPITAL_BASED_OUTPATIENT_CLINIC_OR_DEPARTMENT_OTHER): Payer: Self-pay | Admitting: Family

## 2023-05-01 VITALS — BP 130/60 | HR 82 | Resp 16 | Ht <= 58 in | Wt 147.0 lb

## 2023-05-01 DIAGNOSIS — I10 Essential (primary) hypertension: Secondary | ICD-10-CM | POA: Diagnosis not present

## 2023-05-01 DIAGNOSIS — I25118 Atherosclerotic heart disease of native coronary artery with other forms of angina pectoris: Secondary | ICD-10-CM

## 2023-05-01 DIAGNOSIS — E785 Hyperlipidemia, unspecified: Secondary | ICD-10-CM | POA: Diagnosis not present

## 2023-05-01 DIAGNOSIS — I5032 Chronic diastolic (congestive) heart failure: Secondary | ICD-10-CM

## 2023-05-01 NOTE — Patient Instructions (Signed)
 Medication Instructions:  Continue your current medications   Follow-Up: As needed with [facility]   Special Instructions:   To prevent or reduce lower extremity swelling: Eat a low salt diet. Salt makes the body hold onto extra fluid which causes swelling. Sit with legs elevated. For example, in the recliner or on an ottoman.  Wear knee-high compression stockings during the daytime. Ones labeled 15-20 mmHg provide good compression.

## 2023-05-01 NOTE — Progress Notes (Signed)
 [facility] Assessment:    Date:  05/04/2023   ID:  Amy [name], DOB [birth year], MRN [id number]  PCP:  Billie Lade, MD  Cardiologist:  Nona Dell, MD  Nephrologist:  Referring MD: Billie Lade, MD   CC: Hypertension  History of Present Illness:    Amy [name] is a 78 y.o. female with a hx of CAD s/p LAD and diagonal branch stenting in 2015, HFpEF, HTN, renal artery stenosis, carotid stenosis, mild to moderate AS, HLD, sinus node dysfunction s/p PPM 2018.  Seen by Dr. Allyson Sabal 01/27/23 noting dyspnea in setting of COVID 2 weeks prior and more resistant hypertension over the sat 4-5 weeks. BP log 130-105s/60-70s. Due to worsening dyspnea echo and cardiac PET recommended. She is being considered for renal artery stenting given CTA 01/01/23 with severe bilateral renal artery stenosis with ostail calcific plaque.   Established with [facility] 01/30/23.  As she was undergoing workup for possible progression of aortic stenosis she was recommended BP goal less than 140/90 and to continue her present antihypertensive regimen.  Cardiac PET 02/06/2023 low risk with no ischemia.  Echo 02/11/2023 normal LVEF, grade 1 diastolic dysfunction, mild pulmonary hypertension, mild aortic stenosis with mean gradient 8 mmHg.  She was last seen by Dr. Allyson Sabal 03/19/2023 -CTA did reveal severe bilateral renal artery stenosis there was recommended for continued medical therapy and should her blood pressure become more difficult to control her renal function started to deteriorate reconsider renal artery intervention.  Presents today for follow up with family member. She has not been checking her blood pressure at home recently. Still with exeritonal dyspnea but feels this is somewhat improved. No recurrent near syncope. She notes her right leg swells but not her left. She does prop her legs up in the recliner in the afternoon which prevents her swelling from worsening by  end of day. Reports no chest pain, pressure, tightness.    Previous antihypertensives: Lisinopril   Past Medical History:  Diagnosis Date   Asthma    CAD (coronary artery disease)    60-70% proximal LAD/diagonal bifurcational disease October 2015 at [facility] status DES x 2 to LAD/diagonal October 2015   Essential hypertension    Heart murmur    Hyperlipidemia    Hypothyroidism    Sinus node dysfunction (HCC)    Biotronik pacemaker in place    Past Surgical History:  Procedure Laterality Date   ABDOMINAL HYSTERECTOMY     ANAL FISSURE REPAIR     CARDIAC CATHETERIZATION     CATARACT EXTRACTION W/PHACO Right 10/17/2014   Procedure: CATARACT EXTRACTION PHACO AND INTRAOCULAR LENS PLACEMENT (IOC);  Surgeon: Gemma Payor, MD;  Location: [facility];  Service: Ophthalmology;  Laterality: Right;  CDE: 4.88   CATARACT EXTRACTION W/PHACO Left 10/27/2014   Procedure: CATARACT EXTRACTION PHACO AND INTRAOCULAR LENS PLACEMENT (IOC);  Surgeon: Gemma Payor, MD;  Location: [facility];  Service: Ophthalmology;  Laterality: Left;  CDE:7.86   COLONOSCOPY N/A 04/16/2018   Procedure: COLONOSCOPY;  Surgeon: Malissa Hippo, MD;  Location: [facility];  Service: Endoscopy;  Laterality: N/A;  830   FINGER SURGERY     INSERT / REPLACE / REMOVE PACEMAKER     NASAL SINUS SURGERY     PACEMAKER IMPLANT N/A 07/30/2016   Procedure: Pacemaker Implant Dual Chamber;  Surgeon: Marinus Maw, MD;  Location: [facility] INVASIVE CV LAB;  Service: Cardiovascular;  Laterality: N/A;   TONSILLECTOMY      Current Medications:  Current Meds  Medication Sig   amLODipine (NORVASC) 10 MG tablet Take 1 tablet (10 mg total) by mouth every evening.   aspirin 81 MG tablet Take 81 mg by mouth daily.   atorvastatin (LIPITOR) 20 MG tablet Take 20 mg by mouth at bedtime.    betamethasone valerate ointment (VALISONE) 0.1 % APPLY TO THE AFFECTED AREA(S) TWICE DAILY   calcium carbonate (OSCAL) 1500 (600 Ca) MG TABS tablet Take 1,500 mg by mouth  daily with breakfast.   Cholecalciferol (VITAMIN D3) 2000 units capsule Take 2,000 Units by mouth daily at 12 noon.    clobetasol (TEMOVATE) 0.05 % external solution Apply 1 application  topically as needed (psoriasis).   clopidogrel (PLAVIX) 75 MG tablet TAKE 1 TABLET BY MOUTH DAILY   hydrochlorothiazide (HYDRODIURIL) 12.5 MG tablet TAKE 1 TABLET BY MOUTH EVERY DAY HIGH BLOOD PRESSURE   Iron, Ferrous Sulfate, 325 (65 Fe) MG TABS Take 325 mg by mouth daily.   isosorbide mononitrate (IMDUR) 60 MG 24 hr tablet TAKE 1 TABLET BY MOUTH DAILY   levothyroxine (SYNTHROID) 75 MCG tablet Take 75 mcg by mouth every morning.   magnesium oxide (MAG-OX) 400 (240 Mg) MG tablet Take 0.5 tablets by mouth daily.   Multiple Vitamin (MULTIVITAMIN WITH MINERALS) TABS tablet Take 1 tablet by mouth daily at 12 noon.    pantoprazole (PROTONIX) 40 MG tablet Take 1 tablet (40 mg total) by mouth daily.   valsartan (DIOVAN) 160 MG tablet Take 1 tablet (160 mg total) by mouth daily.     Allergies:   Sulfa antibiotics   Social History   Socioeconomic History   Marital status: Widowed    Spouse name: Not on file   Number of children: 1   Years of education: Not on file   Highest education level: Not on file  Occupational History   Not on file  Tobacco Use   Smoking status: Never   Smokeless tobacco: Never  Vaping Use   Vaping status: Never Used  Substance and Sexual Activity   Alcohol use: No   Drug use: No   Sexual activity: Not Currently    Birth control/protection: None  Other Topics Concern   Not on file  Social History Narrative   Not on file   Social Drivers of Health   Financial Resource Strain: Low Risk  (03/10/2023)   Overall Financial Resource Strain (CARDIA)    Difficulty of Paying Living Expenses: Not hard at all  Food Insecurity: No Food Insecurity (03/10/2023)   Hunger Vital Sign    Worried About Running Out of Food in the Last Year: Never true    Ran Out of Food in the Last Year: Never  true  Transportation Needs: Patient Declined (03/10/2023)   PRAPARE - Transportation    Lack of Transportation (Medical): Patient declined    Lack of Transportation (Non-Medical): Patient declined  Physical Activity: Insufficiently Active (03/10/2023)   Exercise Vital Sign    Days of Exercise per Week: 2 days    Minutes of Exercise per Session: 20 min  Stress: Stress Concern Present (03/10/2023)   [name] of Occupational Health - Occupational Stress Questionnaire    Feeling of Stress : To some extent  Social Connections: Moderately Integrated (03/10/2023)   Social Connection and Isolation Panel [NHANES]    Frequency of Communication with Friends and Family: More than three times a week    Frequency of Social Gatherings with Friends and Family: More than three times a week  Attends Religious Services: More than 4 times per year    Active Member of Clubs or Organizations: Yes    Attends Banker Meetings: More than 4 times per year    Marital Status: Widowed     Family History: The patient's family history includes Heart disease in her father and mother; Parkinson's disease in her mother.  ROS:   Please see the history of present illness.     All other systems reviewed and are negative.  EKGs/Labs/Other Studies Reviewed:         Recent Labs: 12/27/2022: Magnesium 1.7 03/17/2023: TSH 1.610 04/02/2023: ALT 18; BUN 23; Creatinine, Ser 0.99; Hemoglobin 10.9; Platelets 147; Potassium 3.9; Sodium 137   Recent Lipid Panel    Component Value Date/Time   CHOL 137 03/17/2023 1653   TRIG 58 03/17/2023 1653   HDL 67 03/17/2023 1653   CHOLHDL 2.0 03/17/2023 1653   LDLCALC 58 03/17/2023 1653    Physical Exam:   VS:  BP 130/60   Pulse 82   Resp 16   Ht 4\' 10"  (1.473 m)   Wt 147 lb (66.7 kg)   SpO2 97%   BMI 30.72 kg/m  , BMI Body mass index is 30.72 kg/m.  Vitals:   05/01/23 1329 05/01/23 1345  BP: (!) 170/53 130/60  Pulse: 82   Resp: 16   Height: 4\' 10"   (1.473 m)   Weight: 147 lb (66.7 kg)   SpO2: 97%   BMI (Calculated): 30.73     GENERAL:  Well appearing HEENT: Pupils equal round and reactive, fundi not visualized, oral mucosa unremarkable NECK:  No jugular venous distention, waveform within normal limits, carotid upstroke brisk and symmetric, no bruits, no thyromegaly LYMPHATICS:  No cervical adenopathy LUNGS:  Clear to auscultation bilaterally HEART:  RRR.  PMI not displaced or sustained,S1 and S2 within normal limits, no S3, no S4, no clicks, no rubs, no murmurs ABD:  Flat, positive bowel sounds normal in frequency in pitch, no bruits, no rebound, no guarding, no midline pulsatile mass, no hepatomegaly, no splenomegaly EXT:  2 plus pulses throughout, no edema, no cyanosis no clubbing SKIN:  No rashes no nodules NEURO:  Cranial nerves II through XII grossly intact, motor grossly intact throughout PSYCH:  Cognitively intact, oriented to person place and time   ASSESSMENT/PLAN:    HTN - Initial BP in clinic 170/52 with repeat without intervention 130/60. Has not been monitoring BP at home. Encouraged to resume.  Continue present antihypertensive regimen amlodipine 10 mg daily, hydrochlorothiazide 12.5 mg daily, Imdur 60 mg daily, valsartan 160 mg daily.  If increased control needed in the future could increase dose of Imdur, valsartan.  Could also consider combination tablet amlodipine-valsartan-HCTZ pending insurance coverage. Renal artery stenosis, as below. Not candidate for RDN due to RAS.  Renin-aldosterone not consistent with hyperaldosteronism.   Aortic stenosis-echo 02/2023 normal LVEF, gr1dd, mild PAH, mild AS. Repeat echo in one year per general cardiology team.   Carotid stenosis - Duplex 12/2022 bilateral 1-39% stenosis. Repeat imaging as clinically indicated per Dr. Allyson Sabal. Continue Atorvastatin to prevent progression.   Bilateral renal artery stenosis - Significant renal artery stenosis but as stable renal function, BP  proceed with medical management. Follows with Dr. Allyson Sabal.   Sinus node dysfunction s/p PPM -follows with Dr. Ladona Ridgel of EP.  HFpEF - Per primary cardiology team. Euvolemic on exam. Leg elevation encouraged for swelling.   CAD - s/p LAD diagonal branch stenting 11/2013. Myoview 11/2021 low risk. Cardiac  PET 02/2023 low risk study.   Screening for Secondary Hypertension:     01/30/2023    1:15 PM 01/30/2023    1:30 PM  Causes  Drugs/Herbals  Screened     - Comments  No OTC agents, no excessive caffeine.  Renovascular HTN Screened      - Comments severe renal artery stenosis - follows with Dr. Allyson Sabal   Sleep Apnea  Not Screened     - Comments  no snoring, some waking tired, overall inconsistent with sleep apnea. could consider testing in future if BP remains difficult to control.  Thyroid Disease Screened      - Comments 05/2022 normal TSH   Pheochromocytoma N/A      - Comments 10/2022 CT normal adrenals   Cushing's Syndrome N/A      - Comments non cushingoid appearance   Coarctation of the Aorta N/A   Compliance  Screened     - Comments  takes medications regularly.    Relevant Labs/Studies:    Latest Ref Rng & Units 04/02/2023   11:50 AM 03/17/2023    4:53 PM 12/06/2022   11:42 AM  Basic Labs  Sodium 135 - 145 mmol/L 137  139  131   Potassium 3.5 - 5.1 mmol/L 3.9  3.9  4.2   Creatinine 0.44 - 1.00 mg/dL 1.61  0.96  0.45        Latest Ref Rng & Units 03/17/2023    4:53 PM  Thyroid   TSH 0.450 - 4.500 uIU/mL 1.610        Latest Ref Rng & Units 01/30/2023    3:27 PM  Renin/Aldosterone   Aldosterone 0.0 - 30.0 ng/dL 2.5   Aldos/Renin Ratio 0.0 - 30.0 0.2              03/19/2023    4:20 PM  Renovascular   Renal Artery [place] Completed Yes      Disposition:    follow up as needed with [facility]    Medication Adjustments/Labs and Tests Ordered: Current medicines are reviewed at length with the patient today.  Concerns regarding medicines are outlined  above.  No orders of the defined types were placed in this encounter.  No orders of the defined types were placed in this encounter.    Signed, Alver Sorrow, NP  05/04/2023 3:21 PM    [facility]

## 2023-05-02 ENCOUNTER — Ambulatory Visit (INDEPENDENT_AMBULATORY_CARE_PROVIDER_SITE_OTHER): Payer: Medicare Other

## 2023-05-02 DIAGNOSIS — I495 Sick sinus syndrome: Secondary | ICD-10-CM

## 2023-05-05 LAB — CUP PACEART REMOTE DEVICE CHECK
Battery Voltage: 50
Date Time Interrogation Session: 20250328085921
Implantable Lead Connection Status: 753985
Implantable Lead Connection Status: 753985
Implantable Lead Implant Date: 20180626
Implantable Lead Implant Date: 20180626
Implantable Lead Location: 753859
Implantable Lead Location: 753860
Implantable Lead Model: 377
Implantable Lead Model: 377
Implantable Lead Serial Number: 49791993
Implantable Lead Serial Number: 49865965
Implantable Pulse Generator Implant Date: 20180626
Pulse Gen Model: 407145
Pulse Gen Serial Number: 69052081

## 2023-05-06 DIAGNOSIS — Z1231 Encounter for screening mammogram for malignant neoplasm of breast: Secondary | ICD-10-CM | POA: Diagnosis not present

## 2023-05-11 ENCOUNTER — Encounter: Payer: Self-pay | Admitting: Internal Medicine

## 2023-05-14 LAB — HM MAMMOGRAPHY

## 2023-05-21 DIAGNOSIS — L280 Lichen simplex chronicus: Secondary | ICD-10-CM | POA: Diagnosis not present

## 2023-06-02 ENCOUNTER — Other Ambulatory Visit: Payer: Self-pay | Admitting: Nurse Practitioner

## 2023-06-09 DIAGNOSIS — H01002 Unspecified blepharitis right lower eyelid: Secondary | ICD-10-CM | POA: Diagnosis not present

## 2023-06-09 DIAGNOSIS — H04123 Dry eye syndrome of bilateral lacrimal glands: Secondary | ICD-10-CM | POA: Diagnosis not present

## 2023-06-09 DIAGNOSIS — H16233 Neurotrophic keratoconjunctivitis, bilateral: Secondary | ICD-10-CM | POA: Diagnosis not present

## 2023-06-09 DIAGNOSIS — H02834 Dermatochalasis of left upper eyelid: Secondary | ICD-10-CM | POA: Diagnosis not present

## 2023-06-09 DIAGNOSIS — H01005 Unspecified blepharitis left lower eyelid: Secondary | ICD-10-CM | POA: Diagnosis not present

## 2023-06-09 DIAGNOSIS — H01001 Unspecified blepharitis right upper eyelid: Secondary | ICD-10-CM | POA: Diagnosis not present

## 2023-06-09 DIAGNOSIS — H02831 Dermatochalasis of right upper eyelid: Secondary | ICD-10-CM | POA: Diagnosis not present

## 2023-06-09 DIAGNOSIS — H01004 Unspecified blepharitis left upper eyelid: Secondary | ICD-10-CM | POA: Diagnosis not present

## 2023-06-09 DIAGNOSIS — Z961 Presence of intraocular lens: Secondary | ICD-10-CM | POA: Diagnosis not present

## 2023-06-10 ENCOUNTER — Other Ambulatory Visit: Payer: Self-pay | Admitting: Internal Medicine

## 2023-06-11 NOTE — Progress Notes (Signed)
 Remote pacemaker transmission.

## 2023-06-11 NOTE — Addendum Note (Signed)
 Addended by: Lott Rouleau A on: 06/11/2023 08:47 AM   Modules accepted: Orders

## 2023-06-16 ENCOUNTER — Encounter (HOSPITAL_COMMUNITY): Payer: Self-pay

## 2023-06-16 DIAGNOSIS — L280 Lichen simplex chronicus: Secondary | ICD-10-CM | POA: Diagnosis not present

## 2023-06-23 ENCOUNTER — Encounter: Payer: Self-pay | Admitting: Internal Medicine

## 2023-06-23 ENCOUNTER — Ambulatory Visit (INDEPENDENT_AMBULATORY_CARE_PROVIDER_SITE_OTHER): Payer: Medicare Other | Admitting: Internal Medicine

## 2023-06-23 VITALS — BP 138/64 | HR 93 | Ht <= 58 in | Wt 146.0 lb

## 2023-06-23 DIAGNOSIS — D509 Iron deficiency anemia, unspecified: Secondary | ICD-10-CM | POA: Diagnosis not present

## 2023-06-23 DIAGNOSIS — L280 Lichen simplex chronicus: Secondary | ICD-10-CM | POA: Diagnosis not present

## 2023-06-23 DIAGNOSIS — R161 Splenomegaly, not elsewhere classified: Secondary | ICD-10-CM

## 2023-06-23 DIAGNOSIS — I1A0 Resistant hypertension: Secondary | ICD-10-CM | POA: Diagnosis not present

## 2023-06-23 NOTE — Progress Notes (Signed)
 Established Patient Office Visit  Subjective   Patient ID: Amy [name], female    DOB: 1945-11-23  Age: 78 y.o. MRN: [id number]  Chief Complaint  Patient presents with   Hypertension    Three month follow up    Amy [name] returns to care today for routine follow-up.  She was last evaluated by me on 2/10.  No medication changes were made at that time, repeat labs ordered, and 9-month follow-up arranged.  In the interim she has been seen by cardiology on multiple occasions as well as oncology in the setting of splenomegaly and normocytic anemia.  Today she reports feeling well and has no acute concerns to discuss.  Past Medical History:  Diagnosis Date   Asthma    CAD (coronary artery disease)    60-70% proximal LAD/diagonal bifurcational disease October 2015 at [facility] status DES x 2 to LAD/diagonal October 2015   Essential hypertension    Heart murmur    Hyperlipidemia    Hypothyroidism    Sinus node dysfunction (HCC)    Biotronik pacemaker in place   Past Surgical History:  Procedure Laterality Date   ABDOMINAL HYSTERECTOMY     ANAL FISSURE REPAIR     CARDIAC CATHETERIZATION     CATARACT EXTRACTION W/PHACO Right 10/17/2014   Procedure: CATARACT EXTRACTION PHACO AND INTRAOCULAR LENS PLACEMENT (IOC);  Surgeon: Anner Kill, MD;  Location: [facility];  Service: Ophthalmology;  Laterality: Right;  CDE: 4.88   CATARACT EXTRACTION W/PHACO Left 10/27/2014   Procedure: CATARACT EXTRACTION PHACO AND INTRAOCULAR LENS PLACEMENT (IOC);  Surgeon: Anner Kill, MD;  Location: [facility];  Service: Ophthalmology;  Laterality: Left;  CDE:7.86   COLONOSCOPY N/A 04/16/2018   Procedure: COLONOSCOPY;  Surgeon: Ruby Corporal, MD;  Location: [facility] SU[facility];  Service: Endoscopy;  Laterality: N/A;  830   FINGER SURGERY     INSERT / REPLACE / REMOVE PACEMAKER     NASAL SINUS SURGERY     PACEMAKER IMPLANT N/A 07/30/2016   Procedure: Pacemaker Implant Dual Chamber;  Surgeon: Tammie Fall, MD;  Location: [facility]  INVASIVE CV LAB;  Service: Cardiovascular;  Laterality: N/A;   TONSILLECTOMY     Social History   Tobacco Use   Smoking status: Never   Smokeless tobacco: Never  Vaping Use   Vaping status: Never Used  Substance Use Topics   Alcohol  use: No   Drug use: No   Family History  Problem Relation Age of Onset   Heart disease Mother    Parkinson's disease Mother    Heart disease Father    Allergies  Allergen Reactions   Sulfa Antibiotics Rash   Review of Systems  Constitutional:  Negative for chills and fever.  HENT:  Negative for sore throat.   Respiratory:  Negative for cough and shortness of breath.   Cardiovascular:  Negative for chest pain, palpitations and leg swelling.  Gastrointestinal:  Negative for abdominal pain, blood in stool, constipation, diarrhea, nausea and vomiting.  Genitourinary:  Negative for dysuria and hematuria.  Musculoskeletal:  Negative for myalgias.  Skin:  Negative for itching and rash.  Neurological:  Negative for dizziness and headaches.  Psychiatric/Behavioral:  Negative for depression and suicidal ideas.      Objective:     BP 138/64   Pulse 93   Ht 4\' 10"  (1.473 m)   Wt 146 lb (66.2 kg)   SpO2 97%   BMI 30.51 kg/m  BP Readings from Last 3 Encounters:  06/23/23 138/64  05/01/23 130/60  04/18/23 (!) 152/51   Physical Exam Vitals reviewed.  Constitutional:      General: She is not in acute distress.    Appearance: Normal appearance. She is not toxic-appearing.  HENT:     Head: Normocephalic and atraumatic.     Right Ear: External ear normal.     Left Ear: External ear normal.     Nose: Nose normal. No congestion or rhinorrhea.     Mouth/Throat:     Mouth: Mucous membranes are moist.     Pharynx: Oropharynx is clear. No oropharyngeal exudate or posterior oropharyngeal erythema.  Eyes:     General: No scleral icterus.    Extraocular Movements: Extraocular movements intact.     Conjunctiva/sclera: Conjunctivae normal.      Pupils: Pupils are equal, round, and reactive to light.  Cardiovascular:     Rate and Rhythm: Normal rate and regular rhythm.     Pulses: Normal pulses.     Heart sounds: Murmur heard.     No friction rub. No gallop.  Pulmonary:     Effort: Pulmonary effort is normal.     Breath sounds: Normal breath sounds. No wheezing, rhonchi or rales.  Abdominal:     General: Abdomen is flat. Bowel sounds are normal. There is no distension.     Palpations: Abdomen is soft.     Tenderness: There is no abdominal tenderness.  Musculoskeletal:        General: No swelling. Normal range of motion.     Cervical back: Normal range of motion.     Right lower leg: No edema.     Left lower leg: No edema.  Lymphadenopathy:     Cervical: No cervical adenopathy.  Skin:    General: Skin is warm and dry.     Capillary Refill: Capillary refill takes less than 2 seconds.     Coloration: Skin is not jaundiced.  Neurological:     General: No focal deficit present.     Mental Status: She is alert and oriented to person, place, and time.  Psychiatric:        Mood and Affect: Mood normal.        Behavior: Behavior normal.   Last CBC Lab Results  Component Value Date   WBC 5.6 04/02/2023   HGB 10.9 (L) 04/02/2023   HCT 32.7 (L) 04/02/2023   MCV 90.6 04/02/2023   MCH 30.2 04/02/2023   RDW 13.5 04/02/2023   PLT 147 (L) 04/02/2023   Last metabolic panel Lab Results  Component Value Date   GLUCOSE 91 04/02/2023   NA 137 04/02/2023   K 3.9 04/02/2023   CL 102 04/02/2023   CO2 25 04/02/2023   BUN 23 04/02/2023   CREATININE 0.99 04/02/2023   GFRNONAA 58 (L) 04/02/2023   CALCIUM 9.0 04/02/2023   PROT 6.6 04/02/2023   ALBUMIN 3.9 04/02/2023   LABGLOB 2.2 03/17/2023   BILITOT 1.4 (H) 04/02/2023   ALKPHOS 89 04/02/2023   AST 20 04/02/2023   ALT 18 04/02/2023   ANIONGAP 10 04/02/2023   Last lipids Lab Results  Component Value Date   CHOL 137 03/17/2023   HDL 67 03/17/2023   LDLCALC 58 03/17/2023    TRIG 58 03/17/2023   CHOLHDL 2.0 03/17/2023   Last hemoglobin A1c Lab Results  Component Value Date   HGBA1C 5.2 03/17/2023   Last thyroid  functions Lab Results  Component Value Date   TSH 1.610 03/17/2023   Last vitamin D  Lab  Results  Component Value Date   VD25OH 68.6 03/17/2023   Last vitamin B12 and Folate Lab Results  Component Value Date   VITAMINB12 761 03/17/2023   FOLATE >20.0 03/17/2023   The 10-year ASCVD risk score (Arnett DK, et al., 2019) is: 33.1%    Assessment & Plan:   Problem List Items Addressed This Visit       Resistant hypertension - Primary   Stable on current antihypertensive regimen.  She is closely followed by cardiology.  Recently seen in the advanced HTN clinic.  She has also been seen by Dr. Katheryne Pane for follow-up in the setting of no intervention is indicated at this time given adequate HTN control and normal renal function.      Splenomegaly   Stable.  Recently seen by hematology for follow-up.  She remains under observation.  Follow-up is scheduled for September.      Iron  deficiency anemia   Recently seen by hematology and received one iron  infusion.  Repeat labs are pending.      Return in about 6 months (around 12/24/2023).   Tobi Fortes, MD

## 2023-06-23 NOTE — Patient Instructions (Signed)
It was a pleasure to see you today.  Thank you for giving [place] the opportunity to be involved in your care.  Below is a brief recap of your visit and next steps.  We will plan to see you again in 6 months  Summary No medication changes today Follow up in 6 months

## 2023-06-24 NOTE — Assessment & Plan Note (Signed)
 Recently seen by hematology and received one iron  infusion.  Repeat labs are pending.

## 2023-06-24 NOTE — Assessment & Plan Note (Addendum)
 Stable.  Recently seen by hematology for follow-up.  She remains under observation.  Follow-up is scheduled for September.

## 2023-06-24 NOTE — Assessment & Plan Note (Signed)
 Stable on current antihypertensive regimen.  She is closely followed by cardiology.  Recently seen in the advanced HTN clinic.  She has also been seen by Dr. Katheryne Pane for follow-up in the setting of no intervention is indicated at this time given adequate HTN control and normal renal function.

## 2023-07-01 ENCOUNTER — Other Ambulatory Visit: Payer: Self-pay | Admitting: Internal Medicine

## 2023-07-01 DIAGNOSIS — I10 Essential (primary) hypertension: Secondary | ICD-10-CM

## 2023-07-03 DIAGNOSIS — H16233 Neurotrophic keratoconjunctivitis, bilateral: Secondary | ICD-10-CM | POA: Diagnosis not present

## 2023-07-08 ENCOUNTER — Ambulatory Visit
Admission: EM | Admit: 2023-07-08 | Discharge: 2023-07-08 | Disposition: A | Discharge status: Home / Self Care | Attending: Nurse Practitioner | Admitting: Nurse Practitioner

## 2023-07-08 ENCOUNTER — Ambulatory Visit: Payer: Self-pay

## 2023-07-08 DIAGNOSIS — R059 Cough, unspecified: Secondary | ICD-10-CM | POA: Diagnosis not present

## 2023-07-08 DIAGNOSIS — J069 Acute upper respiratory infection, unspecified: Secondary | ICD-10-CM

## 2023-07-08 MED ORDER — AMOXICILLIN-POT CLAVULANATE 875-125 MG PO TABS: 1.0000 | ORAL_TABLET | Freq: Two times a day (BID) | ORAL | 0 refills | Status: AC

## 2023-07-08 MED ORDER — FLUTICASONE PROPIONATE 50 MCG/ACT NA SUSP
2.0000 | Freq: Every day | NASAL | 0 refills | Status: DC
Start: 2023-07-08 — End: 2023-08-21

## 2023-07-08 MED ORDER — GUAIFENESIN 100 MG/5ML PO LIQD: 10.0000 mL | Freq: Four times a day (QID) | ORAL | 0 refills | Status: DC | PRN

## 2023-07-08 NOTE — Telephone Encounter (Signed)
Patient advised to go to urgent care

## 2023-07-08 NOTE — ED Triage Notes (Signed)
 Pt reports she has a cough with mucus, headache, nasal congestion x 6 days

## 2023-07-08 NOTE — Telephone Encounter (Signed)
  Chief Complaint: worsened cough Symptoms: nagging cough, lt brown phlegm, runny nose Frequency: 6 days Pertinent Negatives: Patient denies SOB, fever Disposition: [] ED /[x] Urgent Care (no appt availability in office) / [] Appointment(In office/virtual)/ []  [facility]/ [] Home Care/ [] Refused Recommended Disposition /[] [place] [facility]/ []  Follow-up with PCP Additional Notes: called CAL and no appts in office  Copied from CRM [id number]. Topic: Clinical - Red Word Triage >> Jul 08, 2023  8:20 AM Amy [name] [name]: Red Word that prompted transfer to Nurse Triage: Patient called reporting a lingering cough with mucus lasting for the past 5 days. She stated that when she coughs, the mucus is difficult to come out. Reason for Disposition  SEVERE coughing spells (e.g., whooping sound after coughing, vomiting after coughing)  Answer Assessment - Initial Assessment Questions 1. ONSET: "When did the cough begin?"      6 days ago  2. SEVERITY: "How bad is the cough today?"      Coughing harder,  3. SPUTUM: "Describe the color of your sputum" (none, dry cough; clear, white, yellow, green)      Lt Brown white 4. HEMOPTYSIS: "Are you coughing up any blood?" If so ask: "How much?" (flecks, streaks, tablespoons, etc.)     no 5. DIFFICULTY BREATHING: "Are you having difficulty breathing?" If Yes, ask: "How bad is it?" (e.g., mild, moderate, severe)    - MILD: No SOB at rest, mild SOB with walking, speaks normally in sentences, can lie down, no retractions, pulse < 100.    - MODERATE: SOB at rest, SOB with minimal exertion and prefers to sit, cannot lie down flat, speaks in phrases, mild retractions, audible wheezing, pulse 100-120.    - SEVERE: Very SOB at rest, speaks in single words, struggling to breathe, sitting hunched forward, retractions, pulse > 120      denies 6. FEVER: "Do you have a fever?" If Yes, ask: "What is your temperature, how was it measured, and when did it start?"      no 10. OTHER SYMPTOMS: "Do you have any other symptoms?" (e.g., runny nose, wheezing, chest pain)       Nasal congestion, nagging cough, runny nose, mild headache  Protocols used: Cough - Acute Productive-A-AH

## 2023-07-08 NOTE — ED Provider Notes (Signed)
 [facility]    CSN: [id number] Arrival date & time: 07/08/23  4098      History   Chief Complaint No chief complaint on file.   HPI Amy [name] is a 78 y.o. female.   The history is provided by the patient.   Patient with a 6-day history of cough, chest congestion, headache, nasal congestion, and runny nose.  Patient denies fever, chills, ear pain, ear drainage, wheezing, difficulty breathing, chest pain, abdominal pain, nausea, vomiting, diarrhea, or rash.  Patient reports she has been taking over-the-counter Allegra-D, using cough drops, and warm tea along with Tylenol  for pain with minimal relief of her symptoms.  Patient denies any obvious known sick contacts.  Denies history of COPD, asthma, or smoking.  Past Medical History:  Diagnosis Date   Asthma    CAD (coronary artery disease)    60-70% proximal LAD/diagonal bifurcational disease October 2015 at [facility] status DES x 2 to LAD/diagonal October 2015   Essential hypertension    Heart murmur    Hyperlipidemia    Hypothyroidism    Sinus node dysfunction (HCC)    Biotronik pacemaker in place    Patient Active Problem List   Diagnosis Date Noted   Iron  deficiency anemia 04/10/2023   Renal artery stenosis (HCC) 03/19/2023   Normocytic anemia 03/17/2023   Aortic stenosis 01/27/2023   Carotid artery disease (HCC) 12/17/2022   Resistant hypertension 12/13/2022   Need for influenza vaccination 11/01/2022   Coronary artery disease 07/17/2022   GERD (gastroesophageal reflux disease) 07/17/2022   Osteopenia 07/17/2022   Essential hypertension 07/17/2022   Hyperlipidemia 07/17/2022   Hypothyroidism 07/17/2022   Lichen sclerosus 07/17/2022   Psoriasis 07/17/2022   Hyperbilirubinemia 07/16/2022   Splenomegaly 07/16/2022   Seasonal and perennial allergic rhinitis 06/27/2022   Diarrhea 06/27/2022   Special screening for malignant neoplasms, colon 12/25/2017   Sinus node dysfunction (HCC) 07/18/2016    Palpitation 07/18/2016    Past Surgical History:  Procedure Laterality Date   ABDOMINAL HYSTERECTOMY     ANAL FISSURE REPAIR     CARDIAC CATHETERIZATION     CATARACT EXTRACTION W/PHACO Right 10/17/2014   Procedure: CATARACT EXTRACTION PHACO AND INTRAOCULAR LENS PLACEMENT (IOC);  Surgeon: Anner Kill, MD;  Location: [facility];  Service: Ophthalmology;  Laterality: Right;  CDE: 4.88   CATARACT EXTRACTION W/PHACO Left 10/27/2014   Procedure: CATARACT EXTRACTION PHACO AND INTRAOCULAR LENS PLACEMENT (IOC);  Surgeon: Anner Kill, MD;  Location: [facility];  Service: Ophthalmology;  Laterality: Left;  CDE:7.86   COLONOSCOPY N/A 04/16/2018   Procedure: COLONOSCOPY;  Surgeon: Ruby Corporal, MD;  Location: [facility];  Service: Endoscopy;  Laterality: N/A;  830   FINGER SURGERY     INSERT / REPLACE / REMOVE PACEMAKER     NASAL SINUS SURGERY     PACEMAKER IMPLANT N/A 07/30/2016   Procedure: Pacemaker Implant Dual Chamber;  Surgeon: Tammie Fall, MD;  Location: [facility] INVASIVE CV LAB;  Service: Cardiovascular;  Laterality: N/A;   TONSILLECTOMY      OB History   No obstetric history on file.      Home Medications    Prior to Admission medications   Medication Sig Start Date End Date Taking? Authorizing Provider  amLODipine  (NORVASC ) 10 MG tablet Take 1 tablet (10 mg total) by mouth every evening. 12/17/22   Avanell Leigh, MD  aspirin  81 MG tablet Take 81 mg by mouth daily.    [provider]  atorvastatin (LIPITOR) 20  MG tablet TAKE 1 TABLET BY MOUTH EVERY DAY FOR CHOLESTEROL 06/10/23   Dixon, Phillip E, MD  betamethasone valerate ointment (VALISONE) 0.1 % APPLY TO THE AFFECTED AREA(S) TWICE DAILY 10/22/22   Dixon, Phillip E, MD  calcium carbonate (OSCAL) 1500 (600 Ca) MG TABS tablet Take 1,500 mg by mouth daily with breakfast.    [provider]  Cholecalciferol  (VITAMIN D3) 2000 units capsule Take 2,000 Units by mouth daily at 12 noon.     [provider]   clobetasol  (TEMOVATE ) 0.05 % external solution Apply 1 application  topically as needed (psoriasis).    [provider]  clopidogrel  (PLAVIX ) 75 MG tablet TAKE 1 TABLET BY MOUTH DAILY 03/03/23   Gerard Knight, MD  hydrochlorothiazide (HYDRODIURIL) 12.5 MG tablet TAKE 1 TABLET BY MOUTH EVERY DAY HIGH BLOOD PRESSURE 02/11/23   Clearnce Curia, NP  Iron , Ferrous Sulfate , 325 (65 Fe) MG TABS Take 325 mg by mouth daily. 03/18/23   Tobi Fortes, MD  isosorbide  mononitrate (IMDUR ) 60 MG 24 hr tablet TAKE 1 TABLET BY MOUTH DAILY 07/01/23   Dixon, Phillip E, MD  levothyroxine  (SYNTHROID ) 75 MCG tablet TAKE 1 TABLET BY MOUTH EVERY MORNING 06/10/23   Tobi Fortes, MD  magnesium  oxide (MAG-OX) 400 (240 Mg) MG tablet TAKE 1/2 TABLET BY MOUTH DAILY 06/02/23   Gerard Knight, MD  Multiple Vitamin (MULTIVITAMIN WITH MINERALS) TABS tablet Take 1 tablet by mouth daily at 12 noon.     [provider]  pantoprazole  (PROTONIX ) 40 MG tablet Take 1 tablet (40 mg total) by mouth daily. 11/19/22   Leala Prince, PA-C  valsartan  (DIOVAN ) 160 MG tablet Take 1 tablet (160 mg total) by mouth daily. 12/13/22   Tobi Fortes, MD    Family History Family History  Problem Relation Age of Onset   Heart disease Mother    Parkinson's disease Mother    Heart disease Father     Social History Social History   Tobacco Use   Smoking status: Never   Smokeless tobacco: Never  Vaping Use   Vaping status: Never Used  Substance Use Topics   Alcohol  use: No   Drug use: No     Allergies   Sulfa antibiotics   Review of Systems Review of Systems Per HPI  Physical Exam Triage Vital Signs ED Triage Vitals [07/08/23 1002]  Encounter Vitals Group     BP 138/81     Systolic BP Percentile      Diastolic BP Percentile      Pulse Rate (!) 114     Resp 20     Temp 97.6 F (36.4 C)     Temp Source Oral     SpO2 98 %     Weight      Height      Head Circumference      Peak Flow       Pain Score 5     Pain Loc      Pain Education      Exclude from Growth Chart    No data found.  Updated Vital Signs BP 138/81 (BP Location: Right Arm)   Pulse (!) 114   Temp 97.6 F (36.4 C) (Oral)   Resp 20   SpO2 98%   Visual Acuity Right Eye Distance:   Left Eye Distance:   Bilateral Distance:    Right Eye Near:   Left Eye Near:    Bilateral Near:  Physical Exam Vitals and nursing note reviewed.  Constitutional:      General: She is not in acute distress.    Appearance: Normal appearance.  HENT:     Head: Normocephalic.     Right Ear: Tympanic membrane, ear canal and external ear normal.     Left Ear: Tympanic membrane, ear canal and external ear normal.     Nose: Congestion present.     Mouth/Throat:     Lips: Pink.     Mouth: Mucous membranes are moist.     Pharynx: Postnasal drip present. No pharyngeal swelling, oropharyngeal exudate, posterior oropharyngeal erythema or uvula swelling.     Comments: Cobblestoning present to posterior oropharynx  Eyes:     Extraocular Movements: Extraocular movements intact.     Conjunctiva/sclera: Conjunctivae normal.     Pupils: Pupils are equal, round, and reactive to light.  Cardiovascular:     Rate and Rhythm: Normal rate and regular rhythm.     Pulses: Normal pulses.     Heart sounds: Normal heart sounds.  Pulmonary:     Effort: Pulmonary effort is normal. No respiratory distress.     Breath sounds: Normal breath sounds. No stridor. No wheezing, rhonchi or rales.  Abdominal:     General: Bowel sounds are normal.     Palpations: Abdomen is soft.     Tenderness: There is no abdominal tenderness.  Musculoskeletal:     Cervical back: Normal range of motion.  Skin:    General: Skin is warm and dry.  Neurological:     General: No focal deficit present.     Mental Status: She is alert and oriented to person, place, and time.  Psychiatric:        Mood and Affect: Mood normal.        Behavior: Behavior normal.       UC Treatments / Results  Labs (all labs ordered are listed, but only abnormal results are displayed) Labs Reviewed - No data to display  EKG   Radiology No results found.  Procedures Procedures (including critical care time)  Medications Ordered in UC Medications - No data to display  Initial Impression / Assessment and Plan / UC Course  I have reviewed the triage vital signs and the nursing notes.  Pertinent labs & imaging results that were available during my care of the patient were reviewed by me and considered in my medical decision making (see chart for details).  On exam, lung sounds are clear throughout, room air sats at 98%.  The patient is well-appearing, and she is in no acute distress.  Symptoms are consistent with a viral URI with cough.  Will provide symptomatic treatment with fluticasone 50 mcg nasal spray and guaifenesin 100 mg.  Patient was advised if symptoms fail to improve over the next several days, recommend beginning Augmentin 875/125 to cover for bacterial infection.  Supportive care recommendations were provided and discussed with the patient to include fluids, rest, over-the-counter analgesics, and use of a humidifier during sleep.  Patient was given indications regarding follow-up.  Patient was in agreement with this plan of care and verbalizes understanding.  All questions were answered.  Patient stable for discharge.   Final Clinical Impressions(s) / UC Diagnoses   Final diagnoses:  None   Discharge Instructions   None    ED Prescriptions   None    PDMP not reviewed this encounter.   Hardy Lia, NP 07/08/23 1020

## 2023-07-08 NOTE — Discharge Instructions (Signed)
 Take medication as prescribed.  As discussed, if symptoms are not improving over the next 3 to 5 days, you may start the antibiotic. Increase fluids and allow for plenty of rest. You may take over-the-counter Tylenol  as needed for pain, fever, or general discomfort. Recommend use of normal saline nasal spray throughout the day for nasal congestion and runny nose. For your cough, recommend use of a humidifier in your bedroom at nighttime during sleep and sleeping elevated on pillows while symptoms persist.  If symptoms fail to improve with this treatment, please follow-up with your primary care physician for further evaluation. Follow-up as needed.

## 2023-07-10 ENCOUNTER — Ambulatory Visit: Payer: Self-pay

## 2023-07-10 DIAGNOSIS — L280 Lichen simplex chronicus: Secondary | ICD-10-CM | POA: Diagnosis not present

## 2023-07-10 NOTE — Telephone Encounter (Signed)
 Reason for Disposition  [1] Sinus congestion as part of a cold AND [2] present < 10 days  Answer Assessment - Initial Assessment Questions 1. LOCATION: "Where does it hurt?"      head 2. ONSET: "When did the sinus pain start?"  (e.g., hours, days)      week 3. SEVERITY: "How bad is the pain?"   (Scale 1-10; mild, moderate or severe)   - MILD (1-3): doesn't interfere with normal activities    - MODERATE (4-7): interferes with normal activities (e.g., work or school) or awakens from sleep   - SEVERE (8-10): excruciating pain and patient unable to do any normal activities        Not sure  4. RECURRENT SYMPTOM: "Have you ever had sinus problems before?" If Yes, ask: "When was the last time?" and "What happened that time?"      na 5. NASAL CONGESTION: "Is the nose blocked?" If Yes, ask: "Can you open it or must you breathe through your mouth?"     At times 6. NASAL DISCHARGE: "Do you have discharge from your nose?" If so ask, "What color?"     greenish 7. FEVER: "Do you have a fever?" If Yes, ask: "What is it, how was it measured, and when did it start?"      Not sure  8. OTHER SYMPTOMS: "Do you have any other symptoms?" (e.g., sore throat, cough, earache, difficulty breathing)     Cough, ear pain,     Went to UC  yesterday. Pt started abx yesterday. Went to [employer] today and doctor told her he felt it was going into chest and to see PCP  Protocols used: Sinus Pain or Congestion-A-AH

## 2023-07-10 NOTE — Telephone Encounter (Signed)
 FYI Only or Action Required?: Action required by provider  Patient was last seen in primary care on 06/23/2023 by Tobi Fortes, MD. Called Nurse Triage reporting Sinusitis. Symptoms began a week ago. Interventions attempted: Prescription medications: Augmentin. Symptoms are: gradually worsening.  Triage Disposition: Home Care  Patient/caregiver understands and will follow disposition?: Yes   Home care advice with follow up on 6/13

## 2023-07-16 ENCOUNTER — Ambulatory Visit (INDEPENDENT_AMBULATORY_CARE_PROVIDER_SITE_OTHER)

## 2023-07-16 VITALS — BP 148/69 | HR 80 | Ht 59.0 in | Wt 146.0 lb

## 2023-07-16 DIAGNOSIS — J40 Bronchitis, not specified as acute or chronic: Secondary | ICD-10-CM | POA: Diagnosis not present

## 2023-07-16 MED ORDER — METHYLPREDNISOLONE 4 MG PO TBPK: ORAL_TABLET | ORAL | 0 refills | Status: DC

## 2023-07-16 MED ORDER — PROMETHAZINE-DM 6.25-15 MG/5ML PO SYRP: 5.0000 mL | ORAL_SOLUTION | Freq: Four times a day (QID) | ORAL | 0 refills | Status: DC | PRN

## 2023-07-16 NOTE — Progress Notes (Signed)
   Acute Office Visit  Subjective:     Patient ID: Amy [name], female    DOB: 06-07-1945, 78 y.o.   MRN: [id number]  Chief Complaint  Patient presents with   Medical Management of Chronic Issues    Pt states Still congested    HPI Patient is in today for URI  ROS      Objective:    BP (!) 148/69   Pulse 80   Ht 4' 11 (1.499 m)   Wt 146 lb (66.2 kg)   SpO2 97%   BMI 29.49 kg/m    Physical Exam Vitals and nursing note reviewed.  Constitutional:      Appearance: Normal appearance.  HENT:     Head: Normocephalic.     Right Ear: Tympanic membrane, ear canal and external ear normal.     Left Ear: Tympanic membrane, ear canal and external ear normal.     Nose: Congestion and rhinorrhea present. Rhinorrhea is clear.     Right Sinus: No maxillary sinus tenderness.     Left Sinus: No maxillary sinus tenderness.     Mouth/Throat:     Mouth: Mucous membranes are moist.     Pharynx: Oropharynx is clear.   Eyes:     Extraocular Movements: Extraocular movements intact.     Pupils: Pupils are equal, round, and reactive to light.    Cardiovascular:     Rate and Rhythm: Normal rate and regular rhythm.  Pulmonary:     Effort: Pulmonary effort is normal.     Breath sounds: Examination of the right-lower field reveals wheezing. Examination of the left-lower field reveals wheezing. Wheezing (mild) present. No rhonchi.   Musculoskeletal:     Cervical back: Normal range of motion and neck supple.   Neurological:     Mental Status: She is alert and oriented to person, place, and time.   Psychiatric:        Mood and Affect: Mood normal.        Thought Content: Thought content normal.     No results found for any visits on 07/16/23.      Assessment & Plan:   Problem List Items Addressed This Visit   None Visit Diagnoses       Bronchitis    -  Primary   She recently completed a course of antibiotics prescribed by urgent care.  Will add Medrol  Dosepak and cough  medicine for ongoing cough.   Relevant Medications   methylPREDNISolone  (MEDROL  DOSEPAK) 4 MG TBPK tablet   promethazine-dextromethorphan (PROMETHAZINE-DM) 6.25-15 MG/5ML syrup       Meds ordered this encounter  Medications   methylPREDNISolone  (MEDROL  DOSEPAK) 4 MG TBPK tablet    Sig: Take as package instructions.    Dispense:  1 each    Refill:  0   promethazine-dextromethorphan (PROMETHAZINE-DM) 6.25-15 MG/5ML syrup    Sig: Take 5 mLs by mouth 4 (four) times daily as needed for cough.    Dispense:  118 mL    Refill:  0    No follow-ups on file.  Alison Irvine, FNP

## 2023-07-17 DIAGNOSIS — H16233 Neurotrophic keratoconjunctivitis, bilateral: Secondary | ICD-10-CM | POA: Diagnosis not present

## 2023-07-18 ENCOUNTER — Ambulatory Visit: Payer: Self-pay | Admitting: Family Medicine

## 2023-07-28 DIAGNOSIS — L280 Lichen simplex chronicus: Secondary | ICD-10-CM | POA: Diagnosis not present

## 2023-07-31 ENCOUNTER — Encounter: Payer: Self-pay | Admitting: Internal Medicine

## 2023-07-31 ENCOUNTER — Ambulatory Visit: Payer: Medicare Other | Attending: Internal Medicine | Admitting: Internal Medicine

## 2023-07-31 VITALS — BP 158/86 | HR 60 | Ht 59.0 in | Wt 144.6 lb

## 2023-07-31 DIAGNOSIS — I495 Sick sinus syndrome: Secondary | ICD-10-CM | POA: Insufficient documentation

## 2023-07-31 DIAGNOSIS — H16233 Neurotrophic keratoconjunctivitis, bilateral: Secondary | ICD-10-CM | POA: Diagnosis not present

## 2023-07-31 NOTE — Progress Notes (Signed)
 HPI Amy [name] returns today for followup. She is a pleasant 78 yo woman with a h/o sinus node dysfunction, pAF, s/p PPM insertion. She notes that her bp is better at home. She denies chest pain. She is s/p PCI/Stent over 6 years ago. She has minimal leg swelling. She has not lost weight despite exercise.  Allergies  Allergen Reactions   Sulfa Antibiotics Rash     Current Outpatient Medications  Medication Sig Dispense Refill   amLODipine  (NORVASC ) 10 MG tablet Take 1 tablet (10 mg total) by mouth every evening. 90 tablet 3   aspirin  81 MG tablet Take 81 mg by mouth daily.     atorvastatin (LIPITOR) 20 MG tablet TAKE 1 TABLET BY MOUTH EVERY DAY FOR CHOLESTEROL 90 tablet 3   betamethasone valerate ointment (VALISONE) 0.1 % APPLY TO THE AFFECTED AREA(S) TWICE DAILY 45 g 2   calcium carbonate (OSCAL) 1500 (600 Ca) MG TABS tablet Take 1,500 mg by mouth daily with breakfast.     Cholecalciferol  (VITAMIN D3) 2000 units capsule Take 2,000 Units by mouth daily at 12 noon.      clobetasol  (TEMOVATE ) 0.05 % external solution Apply 1 application  topically as needed (psoriasis).     clopidogrel  (PLAVIX ) 75 MG tablet TAKE 1 TABLET BY MOUTH DAILY 90 tablet 1   fluticasone  (FLONASE ) 50 MCG/ACT nasal spray Place 2 sprays into both nostrils daily. 16 g 0   guaiFENesin  (ROBITUSSIN) 100 MG/5ML liquid Take 10 mLs by mouth every 6 (six) hours as needed for cough or to loosen phlegm. 300 mL 0   hydrochlorothiazide (HYDRODIURIL) 12.5 MG tablet TAKE 1 TABLET BY MOUTH EVERY DAY HIGH BLOOD PRESSURE 90 tablet 3   Iron , Ferrous Sulfate , 325 (65 Fe) MG TABS Take 325 mg by mouth daily. (Patient not taking: Reported on 07/16/2023) 30 tablet 3   isosorbide  mononitrate (IMDUR ) 60 MG 24 hr tablet TAKE 1 TABLET BY MOUTH DAILY 30 tablet 2   levothyroxine  (SYNTHROID ) 75 MCG tablet TAKE 1 TABLET BY MOUTH EVERY MORNING 90 tablet 3   magnesium  oxide (MAG-OX) 400 (240 Mg) MG tablet TAKE 1/2 TABLET BY MOUTH DAILY 45 tablet  1   methylPREDNISolone  (MEDROL  DOSEPAK) 4 MG TBPK tablet Take as package instructions. 1 each 0   Multiple Vitamin (MULTIVITAMIN WITH MINERALS) TABS tablet Take 1 tablet by mouth daily at 12 noon.      pantoprazole  (PROTONIX ) 40 MG tablet Take 1 tablet (40 mg total) by mouth daily. 90 tablet 3   promethazine -dextromethorphan (PROMETHAZINE -DM) 6.25-15 MG/5ML syrup Take 5 mLs by mouth 4 (four) times daily as needed for cough. 118 mL 0   valsartan  (DIOVAN ) 160 MG tablet Take 1 tablet (160 mg total) by mouth daily.     No current facility-administered medications for this visit.     Past Medical History:  Diagnosis Date   Asthma    CAD (coronary artery disease)    60-70% proximal LAD/diagonal bifurcational disease October 2015 at [facility] status DES x 2 to LAD/diagonal October 2015   Essential hypertension    Heart murmur    Hyperlipidemia    Hypothyroidism    Sinus node dysfunction (HCC)    Biotronik pacemaker in place    ROS:   All systems reviewed and negative except as noted in the HPI.   Past Surgical History:  Procedure Laterality Date   ABDOMINAL HYSTERECTOMY     ANAL FISSURE REPAIR     CARDIAC CATHETERIZATION     CATARACT  EXTRACTION W/PHACO Right 10/17/2014   Procedure: CATARACT EXTRACTION PHACO AND INTRAOCULAR LENS PLACEMENT (IOC);  Surgeon: Cherene Mania, MD;  Location: [facility];  Service: Ophthalmology;  Laterality: Right;  CDE: 4.88   CATARACT EXTRACTION W/PHACO Left 10/27/2014   Procedure: CATARACT EXTRACTION PHACO AND INTRAOCULAR LENS PLACEMENT (IOC);  Surgeon: Cherene Mania, MD;  Location: [facility];  Service: Ophthalmology;  Laterality: Left;  CDE:7.86   COLONOSCOPY N/A 04/16/2018   Procedure: COLONOSCOPY;  Surgeon: Golda Claudis PENNER, MD;  Location[facility];  Service: Endoscopy;  Laterality: N/A;  830   FINGER SURGERY     INSERT / REPLACE / REMOVE PACEMAKER     NASAL SINUS SURGERY     PACEMAKER IMPLANT N/A 07/30/2016   Procedure: Pacemaker Implant Dual Chamber;   Surgeon: Waddell Danelle ORN, MD;  Location: [facility] INVASIVE CV LAB;  Service: Cardiovascular;  Laterality: N/A;   TONSILLECTOMY       Family History  Problem Relation Age of Onset   Heart disease Mother    Parkinson's disease Mother    Heart disease Father      Social History   Socioeconomic History   Marital status: Widowed    Spouse name: Not on file   Number of children: 1   Years of education: Not on file   Highest education level: Not on file  Occupational History   Not on file  Tobacco Use   Smoking status: Never   Smokeless tobacco: Never  Vaping Use   Vaping status: Never Used  Substance and Sexual Activity   Alcohol  use: No   Drug use: No   Sexual activity: Not Currently    Birth control/protection: None  Other Topics Concern   Not on file  Social History Narrative   Not on file   Social Drivers of Health   Financial Resource Strain: Low Risk  (03/10/2023)   Overall Financial Resource Strain (CARDIA)    Difficulty of Paying Living Expenses: Not hard at all  Food Insecurity: No Food Insecurity (05/21/2023)   [facility]   Hunger Vital Sign    Within the past 12 months, you worried that your food would run out before you got the money to buy more.: Never true    Within the past 12 months, the food you bought just didn't last and you didn't have money to get more.: Never true  Transportation Needs: No Transportation Needs (05/21/2023)   Received from [facility]   PRAPARE - Transportation    Lack of Transportation (Medical): No    Lack of Transportation (Non-Medical): No  Physical Activity: Insufficiently Active (03/10/2023)   Exercise Vital Sign    Days of Exercise per Week: 2 days    Minutes of Exercise per Session: 20 min  Stress: Stress Concern Present (03/10/2023)   [name] of Occupational Health - Occupational Stress Questionnaire    Feeling of Stress : To some extent  Social Connections: Moderately Integrated (03/10/2023)    Social Connection and Isolation Panel    Frequency of Communication with Friends and Family: More than three times a week    Frequency of Social Gatherings with Friends and Family: More than three times a week    Attends Religious Services: More than 4 times per year    Active Member of [employer] or Organizations: Yes    Attends Banker Meetings: More than 4 times per year    Marital Status: Widowed  Intimate Partner Violence: Not At Risk (05/21/2023)  Received from [facility]   Humiliation, Afraid, Rape, and Kick questionnaire    Within the last year, have you been afraid of your partner or ex-partner?: No    Within the last year, have you been humiliated or emotionally abused in other ways by your partner or ex-partner?: No    Within the last year, have you been kicked, hit, slapped, or otherwise physically hurt by your partner or ex-partner?: No    Within the last year, have you been raped or forced to have any kind of sexual activity by your partner or ex-partner?: No     BP (!) 158/86 (BP Location: Left Arm, Patient Position: Sitting, Cuff Size: Normal)   Pulse 60   Ht 4' 11 (1.499 m)   Wt 144 lb 9.6 oz (65.6 kg)   SpO2 98%   BMI 29.21 kg/m   Physical Exam:  Well appearing 78 yo woman, NAD HEENT: Unremarkable Neck:  No JVD, no thyromegally Lymphatics:  No adenopathy Back:  No CVA tenderness Lungs:  Clear with no wheezes HEART:  Regular rate rhythm, no murmurs, no rubs, no clicks Abd:  soft, positive bowel sounds, no organomegally, no rebound, no guarding Ext:  2 plus pulses, no edema, no cyanosis, no clubbing Skin:  No rashes no nodules Neuro:  CN II through XII intact, motor grossly intact   DEVICE  Normal device function.  See PaceArt for details.   Assess/Plan:  Sinus node dysfunction - she is asymptomatic, s/p PPM insertion. 2. PPM - her biotronik DDD PPM is working normally. We will recheck in several months. 3. HTN - her bp is elevated but  better at home. 4. Dyslipidemia - she has tolerated her statin therapy with no difficulty.   Danelle Waddell HERO.D.

## 2023-07-31 NOTE — Patient Instructions (Signed)

## 2023-08-01 ENCOUNTER — Ambulatory Visit (INDEPENDENT_AMBULATORY_CARE_PROVIDER_SITE_OTHER): Payer: Medicare Other

## 2023-08-01 DIAGNOSIS — I495 Sick sinus syndrome: Secondary | ICD-10-CM | POA: Diagnosis not present

## 2023-08-01 LAB — CUP PACEART REMOTE DEVICE CHECK
Battery Voltage: 45
Date Time Interrogation Session: 20250627095054
Implantable Lead Connection Status: 753985
Implantable Lead Connection Status: 753985
Implantable Lead Implant Date: 20180626
Implantable Lead Implant Date: 20180626
Implantable Lead Location: 753859
Implantable Lead Location: 753860
Implantable Lead Model: 377
Implantable Lead Model: 377
Implantable Lead Serial Number: 49791993
Implantable Lead Serial Number: 49865965
Implantable Pulse Generator Implant Date: 20180626
Pulse Gen Model: 407145
Pulse Gen Serial Number: 69052081

## 2023-08-04 ENCOUNTER — Ambulatory Visit: Payer: Self-pay | Admitting: Internal Medicine

## 2023-08-12 ENCOUNTER — Ambulatory Visit: Payer: Self-pay

## 2023-08-12 NOTE — Telephone Encounter (Signed)
 FYI Only or Action Required?: FYI only for provider.  Patient was last seen in primary care on 07/16/2023 by Amy Doffing, FNP.  Called Nurse Triage reporting Nausea/ vomiting/ diarrhea  Symptoms began over 1 year ago - mostly resolved - now returned.  Interventions attempted: Other: imodium, soda water, eliminated chocolate, Rx patch from neighbor.  Symptoms are: gradually worsening.  Triage Disposition: See PCP Within 2 Weeks  Patient/caregiver understands and will follow disposition?: Yes                        Copied from CRM [id number]. Topic: Clinical - Red Word Triage >> Aug 12, 2023  9:13 AM Charlet HERO wrote: Red Word that prompted transfer to Nurse Triage: Patient is calling about nausea , throwing up and diarrhea she states that it was long time ago. She states that it comes and goes the last time she had it was last week. Reason for Disposition  Vomiting is a chronic symptom (recurrent or ongoing AND present > 4 weeks)  Answer Assessment - Initial Assessment Questions 1. VOMITING SEVERITY: How many times have you vomited in the past 24 hours?     - MILD:  1 - 2 times/day    - MODERATE: 3 - 5 times/day, decreased oral intake without significant weight loss or symptoms of dehydration    - SEVERE: 6 or more times/day, vomits everything or nearly everything, with significant weight loss, symptoms of dehydration      mild 2. ONSET: When did the vomiting begin?       A year ago 3. FLUIDS: What fluids or food have you vomited up today? Have you been able to keep any fluids down?     na 4. ABDOMEN PAIN: Are your having any abdomen pain? If Yes : How bad is it and what does it feel like? (e.g., crampy, dull, intermittent, constant)      Not really 5. DIARRHEA: Is there any diarrhea? If Yes, ask: How many times today?      yes 6. CONTACTS: Is there anyone else in the family with the same symptoms?      no 7. CAUSE: What do you think is  causing your vomiting?     allergy  8. HYDRATION STATUS: Any signs of dehydration? (e.g., dry mouth [not only dry lips], too weak to stand) When did you last urinate?     no 9. OTHER SYMPTOMS: Do you have any other symptoms? (e.g., fever, headache, vertigo, vomiting blood or coffee grounds, recent head injury)     Diarrhea  Protocols used: Vomiting-A-AH

## 2023-08-14 ENCOUNTER — Ambulatory Visit: Payer: Self-pay

## 2023-08-14 VITALS — BP 158/83 | HR 74 | Ht 59.0 in | Wt 143.1 lb

## 2023-08-14 DIAGNOSIS — R112 Nausea with vomiting, unspecified: Secondary | ICD-10-CM

## 2023-08-14 NOTE — Progress Notes (Signed)
   Acute Office Visit  Subjective:     Patient ID: Amy [name], female    DOB: 1945/04/09, 78 y.o.   MRN: [id number]  Chief Complaint  Patient presents with   Medical Management of Chronic Issues    Stomach upset been going on for a while now barley eating, puking and diarrhea at the same time     HPI Patient is in today for recurrent N/V & diarrhea.  She states that this issue started about a year ago, and she thought it may be r/t intake of chocolate.  She say an allergist, who told her that she wasn't allergic to chocolate, but she has still tried to avoid eating chocolate.  She states that her symptoms only occur at night.  She states that they did improve, but have recurred over the past months.  She states that she will take soda water at times, which helps alleviate her symptoms.   She has been wearing a dramamine patch for the past 5 days, which she thinks has helped with her nausea.    ROS     Objective:    BP (!) 158/83   Pulse 74   Ht 4' 11 (1.499 m)   Wt 143 lb 1.3 oz (64.9 kg)   SpO2 94%   BMI 28.90 kg/m    Physical Exam Vitals and nursing note reviewed.  Constitutional:      Appearance: Normal appearance.  HENT:     Head: Normocephalic.  Eyes:     Extraocular Movements: Extraocular movements intact.     Pupils: Pupils are equal, round, and reactive to light.  Cardiovascular:     Rate and Rhythm: Normal rate and regular rhythm.  Pulmonary:     Effort: Pulmonary effort is normal.     Breath sounds: Normal breath sounds.  Abdominal:     General: Abdomen is flat. Bowel sounds are decreased.     Palpations: Abdomen is soft.     Tenderness: There is no abdominal tenderness.     Hernia: No hernia is present.  Musculoskeletal:     Cervical back: Normal range of motion and neck supple.  Neurological:     Mental Status: She is alert and oriented to person, place, and time.  Psychiatric:        Mood and Affect: Mood normal.        Thought Content: Thought  content normal.     No results found for any visits on 08/14/23.      Assessment & Plan:   Problem List Items Addressed This Visit   None Visit Diagnoses       Nausea and vomiting, unspecified vomiting type    -  Primary   This is a chronic condition.  Advised patient to follow-up with GI.  She saw them last year for same symptoms       No orders of the defined types were placed in this encounter.   Return in about 3 months (around 11/14/2023) for chronic follow-up with PCP.  Leita Longs, FNP

## 2023-08-14 NOTE — Patient Instructions (Signed)
 Recommend follow-up with GI for further evaluation of ongoing stomach issues.  [phone]

## 2023-08-20 ENCOUNTER — Ambulatory Visit: Payer: Medicare Other

## 2023-08-20 VITALS — Ht 59.0 in | Wt 143.0 lb

## 2023-08-20 DIAGNOSIS — Z0000 Encounter for general adult medical examination without abnormal findings: Secondary | ICD-10-CM | POA: Diagnosis not present

## 2023-08-20 NOTE — Patient Instructions (Addendum)
 Amy [name] ,  Thank you for taking time out of your busy schedule to complete your Annual Wellness Visit with me. I enjoyed our conversation and look forward to speaking with you again next year. I, as well as your care team,  appreciate your ongoing commitment to your health goals. Please review the following plan we discussed and let me know if I can assist you in the future.  I enjoyed our conversation and look forward to it again next year. Blessing for the upcoming year!!  -Taheem Fricke     TOGETHER, WE CAME UP WITH THE FOLLOWING GAME PLAN! LETS DO THIS!!!  Referrals: No referrals placed today Follow up Visits Next PCP Visit: October 13 at 9:40 am 1 Year F/U Wellness Visit: July 20th at 3:50 pm telephone visit  Clinician Recommendations:  Aim for 30 minutes of exercise or brisk walking, 6-8 glasses of water, and 5 servings of fruits and vegetables each day.       This is a list of the screening recommended for you and due dates:  Health Maintenance  Topic Date Due   DTaP/Tdap/Td vaccine (1 - Tdap) Never done   COVID-19 Vaccine (1 - 2024-25 season) Never done   Medicare Annual Wellness Visit  08/19/2023   Flu Shot  09/05/2023   Mammogram  05/13/2024   DEXA scan (bone density measurement)  09/04/2024   Pneumococcal Vaccine for age over 46  Completed   Hepatitis C Screening  Completed   Zoster (Shingles) Vaccine  Completed   Hepatitis B Vaccine  Aged Out   HPV Vaccine  Aged Out   Meningitis B Vaccine  Aged Out    Advanced directives: (Provided) Advance directive discussed with you today. I have provided a copy for you to complete at home and have notarized. Once this is complete, please bring a copy in to our office so we can scan it into your chart.  Advance Care Planning is important because it:  [x]  Makes sure you receive the medical care that is consistent with your values, goals, and preferences  [x]  It provides guidance to your family and loved ones and reduces their  decisional burden about whether or not they are making the right decisions based on your wishes.  [x]  Follow the link provided in your after visit summary or read over the paperwork we have mailed to you to help you started getting your Advance Directives in place. If you need assistance in completing these, please reach out to us  so that we can help you!  [x]  We will mail you an Advanced Directives packet as we discussed during your visit today. You do not have to have an attorney to complete this paperwork. Read over the packet, discuss it with family, and complete it. Choose who will be making decisions for you in the event you can no longer make them for yourself. Once completed have them notarized, and bring the packet back to the office. We will scan it and make sure it gets into your chart.   [x]  If you choose to have a DNR (Do Not Resuscitate Order) you must get this from your primary care doctor because they have to sign it. You can get this in the office during an appointment.   [x]  THIS ORDER MUST BE VISIBLE AT ALL TIMES WITHIN YOUR HOME SUCH AS ON A REFRIGERATOR.

## 2023-08-20 NOTE — Progress Notes (Signed)
 Subjective:   Amy [name] is a 78 y.o. who presents for a Medicare Wellness preventive visit.  As a reminder, Annual Wellness Visits don't include a physical exam, and some assessments may be limited, especially if this visit is performed virtually. We may recommend an in-person follow-up visit with your provider if needed.  Visit Complete: Virtual I connected with  Amy [name] on 08/20/23 by a audio enabled telemedicine application and verified that I am speaking with the correct person using two identifiers.  Patient Location: Home  Provider Location: Home Office  I discussed the limitations of evaluation and management by telemedicine. The patient expressed understanding and agreed to proceed.  Vital Signs: Because this visit was a virtual/telehealth visit, some criteria may be missing or patient reported. Any vitals not documented were not able to be obtained and vitals that have been documented are patient reported.  VideoDeclined- This patient declined Librarian, academic. Therefore the visit was completed with audio only.  Persons Participating in Visit: Patient.  AWV Questionnaire: No: Patient Medicare AWV questionnaire was not completed prior to this visit.  Cardiac Risk Factors include: advanced age (>65men, >59 women);dyslipidemia;hypertension;sedentary lifestyle     Objective:    Today's Vitals   08/20/23 1449  Weight: 143 lb (64.9 kg)  Height: 4' 11 (1.499 m)   Body mass index is 28.88 kg/m.     08/20/2023    2:40 PM 04/18/2023    9:59 AM 12/06/2022   11:01 AM 11/15/2022    8:22 PM 10/10/2022   11:50 AM 09/19/2022    3:42 PM 08/19/2022    1:52 PM  Advanced Directives  Does Patient Have a Medical Advance Directive? No No No No No No No  Would patient like information on creating a medical advance directive? Yes (MAU/Ambulatory/Procedural Areas - Information given) No - Patient declined   No - Patient declined No - Patient declined  No - Patient declined    Current Medications (verified) Outpatient Encounter Medications as of 08/20/2023  Medication Sig   amLODipine  (NORVASC ) 10 MG tablet Take 1 tablet (10 mg total) by mouth every evening.   aspirin  81 MG tablet Take 81 mg by mouth daily.   atorvastatin (LIPITOR) 20 MG tablet TAKE 1 TABLET BY MOUTH EVERY DAY FOR CHOLESTEROL   betamethasone valerate ointment (VALISONE) 0.1 % APPLY TO THE AFFECTED AREA(S) TWICE DAILY   calcium carbonate (OSCAL) 1500 (600 Ca) MG TABS tablet Take 1,500 mg by mouth daily with breakfast.   Cholecalciferol  (VITAMIN D3) 2000 units capsule Take 2,000 Units by mouth daily at 12 noon.    clobetasol  (TEMOVATE ) 0.05 % external solution Apply 1 application  topically as needed (psoriasis).   clopidogrel  (PLAVIX ) 75 MG tablet TAKE 1 TABLET BY MOUTH DAILY   hydrochlorothiazide (HYDRODIURIL) 12.5 MG tablet TAKE 1 TABLET BY MOUTH EVERY DAY HIGH BLOOD PRESSURE   isosorbide  mononitrate (IMDUR ) 60 MG 24 hr tablet TAKE 1 TABLET BY MOUTH DAILY   levothyroxine  (SYNTHROID ) 75 MCG tablet TAKE 1 TABLET BY MOUTH EVERY MORNING   magnesium  oxide (MAG-OX) 400 (240 Mg) MG tablet TAKE 1/2 TABLET BY MOUTH DAILY   Multiple Vitamin (MULTIVITAMIN WITH MINERALS) TABS tablet Take 1 tablet by mouth daily at 12 noon.    pantoprazole  (PROTONIX ) 40 MG tablet Take 1 tablet (40 mg total) by mouth daily.   valsartan  (DIOVAN ) 160 MG tablet Take 1 tablet (160 mg total) by mouth daily.   fluticasone  (FLONASE ) 50 MCG/ACT nasal spray Place 2  sprays into both nostrils daily. (Patient not taking: Reported on 08/20/2023)   guaiFENesin  (ROBITUSSIN) 100 MG/5ML liquid Take 10 mLs by mouth every 6 (six) hours as needed for cough or to loosen phlegm. (Patient not taking: Reported on 08/20/2023)   promethazine -dextromethorphan (PROMETHAZINE -DM) 6.25-15 MG/5ML syrup Take 5 mLs by mouth 4 (four) times daily as needed for cough. (Patient not taking: Reported on 08/20/2023)   No facility-administered  encounter medications on file as of 08/20/2023.    Allergies (verified) Sulfa antibiotics   History: Past Medical History:  Diagnosis Date   Asthma    CAD (coronary artery disease)    60-70% proximal LAD/diagonal bifurcational disease October 2015 at [facility] status DES x 2 to LAD/diagonal October 2015   Essential hypertension    Heart murmur    Hyperlipidemia    Hypothyroidism    Sinus node dysfunction (HCC)    Biotronik pacemaker in place   Past Surgical History:  Procedure Laterality Date   ABDOMINAL HYSTERECTOMY     ANAL FISSURE REPAIR     CARDIAC CATHETERIZATION     CATARACT EXTRACTION W/PHACO Right 10/17/2014   Procedure: CATARACT EXTRACTION PHACO AND INTRAOCULAR LENS PLACEMENT (IOC);  Surgeon: Cherene Mania, MD;  Location: [facility];  Service: Ophthalmology;  Laterality: Right;  CDE: 4.88   CATARACT EXTRACTION W/PHACO Left 10/27/2014   Procedure: CATARACT EXTRACTION PHACO AND INTRAOCULAR LENS PLACEMENT (IOC);  Surgeon: Cherene Mania, MD;  Location: [facility];  Service: Ophthalmology;  Laterality: Left;  CDE:7.86   COLONOSCOPY N/A 04/16/2018   Procedure: COLONOSCOPY;  Surgeon: Golda Claudis PENNER, MD;  Location: [facility] SUITE;  Service: Endoscopy;  Laterality: N/A;  830   FINGER SURGERY     INSERT / REPLACE / REMOVE PACEMAKER     NASAL SINUS SURGERY     PACEMAKER IMPLANT N/A 07/30/2016   Procedure: Pacemaker Implant Dual Chamber;  Surgeon: Waddell Danelle ORN, MD;  Location: [facility] INVASIVE CV LAB;  Service: Cardiovascular;  Laterality: N/A;   TONSILLECTOMY     Family History  Problem Relation Age of Onset   Heart disease Mother    Parkinson's disease Mother    Heart disease Father    Social History   Socioeconomic History   Marital status: Widowed    Spouse name: Not on file   Number of children: 1   Years of education: Not on file   Highest education level: Not on file  Occupational History   Not on file  Tobacco Use   Smoking status: Never   Smokeless tobacco: Never  Vaping  Use   Vaping status: Never Used  Substance and Sexual Activity   Alcohol  use: No   Drug use: No   Sexual activity: Not Currently    Birth control/protection: None  Other Topics Concern   Not on file  Social History Narrative   Not on file   Social Drivers of Health   Financial Resource Strain: Low Risk  (08/20/2023)   Overall Financial Resource Strain (CARDIA)    Difficulty of Paying Living Expenses: Not hard at all  Food Insecurity: No Food Insecurity (08/20/2023)   Hunger Vital Sign    Worried About Running Out of Food in the Last Year: Never true    Ran Out of Food in the Last Year: Never true  Transportation Needs: Patient Declined (08/20/2023)   PRAPARE - Transportation    Lack of Transportation (Medical): Patient declined    Lack of Transportation (Non-Medical): Patient declined  Physical Activity: Insufficiently Active (08/20/2023)  Exercise Vital Sign    Days of Exercise per Week: 2 days    Minutes of Exercise per Session: 20 min  Stress: No Stress Concern Present (08/20/2023)   [name] of Occupational Health - Occupational Stress Questionnaire    Feeling of Stress: Not at all  Social Connections: Moderately Integrated (08/20/2023)   Social Connection and Isolation Panel    Frequency of Communication with Friends and Family: More than three times a week    Frequency of Social Gatherings with Friends and Family: More than three times a week    Attends Religious Services: More than 4 times per year    Active Member of [employer] or Organizations: Yes    Attends Banker Meetings: More than 4 times per year    Marital Status: Widowed    Tobacco Counseling Counseling given: Yes    Clinical Intake:  Pre-visit preparation completed: Yes  Pain : No/denies pain     BMI - recorded: 28.88 Nutritional Risks: None Diabetes: No  Lab Results  Component Value Date   HGBA1C 5.2 03/17/2023     How often do you need to have someone help you when you  read instructions, pamphlets, or other written materials from your doctor or pharmacy?: 1 - Never  Interpreter Needed?: No  Information entered by :: Stefano ORN [facility]   Activities of Daily Living     08/20/2023    8:12 PM  In your present state of health, do you have any difficulty performing the following activities:  Hearing? 0  Vision? 0  Difficulty concentrating or making decisions? 0  Walking or climbing stairs? 0  Dressing or bathing? 0  Doing errands, shopping? 0  Preparing Food and eating ? N  Using the Toilet? N  In the past six months, have you accidently leaked urine? N  Do you have problems with loss of bowel control? N  Managing your Medications? N  Managing your Finances? N  Housekeeping or managing your Housekeeping? N    Patient Care Team: Bevely Doffing, FNP as PCP - General (Family Medicine) Center, [facility] as Attending Physician Rogers Hai, MD as Medical Oncologist (Hematology) Waddell Danelle ORN, MD as Consulting Physician (Cardiology) Harrie Agent, MD as Consulting Physician (Ophthalmology) Debera Jayson MATSU, MD as Consulting Physician (Cardiology) Court Dorn PARAS, MD as Consulting Physician (Cardiology) Rogers Hai, MD as Consulting Physician (Hematology) Olegario Messier, MD as Referring Physician (Obstetrics and Gynecology)  I have updated your Care Teams any recent Medical Services you may have received from other providers in the past year.     Assessment:   This is a routine wellness examination for [facility].  Hearing/Vision screen Hearing Screening - Comments:: Patient denies any hearing difficulties.   Vision Screening - Comments:: Wears rx glasses - up to date with routine eye exams with  Oneil Kawasaki OD [facility] [place] location   Goals Addressed             This Visit's Progress    Patient Stated       Grow in the Lord and improve my health        Depression Screen     08/20/2023    8:15 PM  07/16/2023    2:11 PM 06/23/2023    3:56 PM 03/17/2023    4:09 PM 11/01/2022    1:52 PM 08/19/2022    1:58 PM 08/01/2022    1:56 PM  PHQ 2/9 Scores  PHQ - 2 Score 0 3 1  0 0 0 0  PHQ- 9 Score 0 8 5   0 5    Fall Risk     08/20/2023    2:54 PM 07/16/2023    2:11 PM 06/23/2023    3:56 PM 03/17/2023    4:09 PM 12/13/2022    3:29 PM  Fall Risk   Falls in the past year? 0 0 0 0 0  Number falls in past yr: 0 0 0 0   Injury with Fall? 0 0 0 0   Risk for fall due to : No Fall Risks No Fall Risks No Fall Risks No Fall Risks No Fall Risks  Follow up Falls evaluation completed;Education provided;Falls prevention discussed Falls evaluation completed Falls evaluation completed Falls evaluation completed Falls evaluation completed    MEDICARE RISK AT HOME:  Medicare Risk at Home Any stairs in or around the home?: Yes If so, are there any without handrails?: No Home free of loose throw rugs in walkways, pet beds, electrical cords, etc?: Yes Adequate lighting in your home to reduce risk of falls?: Yes Life alert?: No Use of a cane, walker or w/c?: No Grab bars in the bathroom?: Yes Shower chair or bench in shower?: Yes Elevated toilet seat or a handicapped toilet?: Yes  TIMED UP AND GO:  Was the test performed?  No  Cognitive Function: 6CIT completed        08/20/2023    8:13 PM 08/19/2022    1:57 PM  6CIT Screen  What Year? 0 points 0 points  What month? 0 points 0 points  What time? 0 points 0 points  Count back from 20 0 points 0 points  Months in reverse 0 points 0 points  Repeat phrase 0 points 0 points  Total Score 0 points 0 points    Immunizations Immunization History  Administered Date(s) Administered   Fluad Trivalent(High Dose 65+) 11/01/2022   PNEUMOCOCCAL CONJUGATE-20 05/29/2022   Pneumococcal Conjugate,unspecified 12/14/2013   Pneumococcal Conjugate-13 11/02/2014   Pneumococcal Polysaccharide-23 10/01/2016   Zoster Recombinant(Shingrix) 11/12/2017, 02/11/2018     Screening Tests Health Maintenance  Topic Date Due   DTaP/Tdap/Td (1 - Tdap) Never done   COVID-19 Vaccine (1 - 2024-25 season) Never done   Medicare Annual Wellness (AWV)  08/19/2023   INFLUENZA VACCINE  09/05/2023   MAMMOGRAM  05/13/2024   DEXA SCAN  09/04/2024   Pneumococcal Vaccine: 50+ Years  Completed   Hepatitis C Screening  Completed   Zoster Vaccines- Shingrix  Completed   Hepatitis B Vaccines  Aged Out   HPV VACCINES  Aged Out   Meningococcal B Vaccine  Aged Out    Health Maintenance  Health Maintenance Due  Topic Date Due   DTaP/Tdap/Td (1 - Tdap) Never done   COVID-19 Vaccine (1 - 2024-25 season) Never done   Medicare Annual Wellness (AWV)  08/19/2023   Health Maintenance Items Addressed: Routine health screenings are up to date  Additional Screening:  Vision Screening: Recommended annual ophthalmology exams for early detection of glaucoma and other disorders of the eye. Would you like a referral to an eye doctor? No    Dental Screening: Recommended annual dental exams for proper oral hygiene  Community Resource Referral / Chronic Care Management: CRR required this visit?  No   CCM required this visit?  No   Plan:    I have personally reviewed and noted the following in the patient's chart:   Medical and social history Use of alcohol , tobacco or illicit drugs  Current medications and supplements including opioid prescriptions. Patient is not currently taking opioid prescriptions. Functional ability and status Nutritional status Physical activity Advanced directives List of other physicians Hospitalizations, surgeries, and ER visits in previous 12 months Vitals Screenings to include cognitive, depression, and falls Referrals and appointments  In addition, I have reviewed and discussed with patient certain preventive protocols, quality metrics, and best practice recommendations. A written personalized care plan for preventive services as well  as general preventive health recommendations were provided to patient.   Vin Yonke, CMA   08/20/2023   After Visit Summary: (MyChart) Due to this being a telephonic visit, the after visit summary with patients personalized plan was offered to patient via MyChart   Notes: Nothing significant to report at this time.

## 2023-08-21 ENCOUNTER — Ambulatory Visit (INDEPENDENT_AMBULATORY_CARE_PROVIDER_SITE_OTHER): Admitting: Gastroenterology

## 2023-08-21 ENCOUNTER — Encounter (INDEPENDENT_AMBULATORY_CARE_PROVIDER_SITE_OTHER): Payer: Self-pay | Admitting: Gastroenterology

## 2023-08-21 ENCOUNTER — Telehealth: Payer: Self-pay | Admitting: *Deleted

## 2023-08-21 VITALS — BP 131/77 | HR 69 | Temp 97.5°F | Ht 58.75 in | Wt 144.2 lb

## 2023-08-21 DIAGNOSIS — R110 Nausea: Secondary | ICD-10-CM

## 2023-08-21 DIAGNOSIS — R112 Nausea with vomiting, unspecified: Secondary | ICD-10-CM | POA: Insufficient documentation

## 2023-08-21 DIAGNOSIS — R161 Splenomegaly, not elsewhere classified: Secondary | ICD-10-CM

## 2023-08-21 DIAGNOSIS — R197 Diarrhea, unspecified: Secondary | ICD-10-CM

## 2023-08-21 MED ORDER — ONDANSETRON 4 MG PO TBDP: 4.0000 mg | ORAL_TABLET | Freq: Three times a day (TID) | ORAL | 3 refills | Status: AC | PRN

## 2023-08-21 NOTE — Patient Instructions (Signed)
 Start Zofran  4 mg q8h as needed for nausea Schedule EGD Referral to hematology at [facility] for 2nd opinion regarding splenomegaly

## 2023-08-21 NOTE — Progress Notes (Signed)
 Amy [name], M.D. Gastroenterology & Hepatology [facility] Gastroenterology [address] [place], [place] [address]  Primary Care Physician: Bevely Doffing, FNP [address] [place] [address]  I will communicate my assessment and recommendations to the referring MD via EMR.  Problems: Recurrent nausea with vomiting Splenomegaly  History of Present Illness: Amy [name] is a 78 y.o.  female with past medical history of Asthma, CAD, HTN, HLD, Hypothyroidism, sinus node dysfunction with pacemaker in place, GERD, osteopenia, vertigo, lichen sclerosis, psoriasis who presents for evaluation of splenomegaly, diarrhea and nausea.  The patient was last seen on 07/16/2022. At that time, the patient was being evaluated for similar symptoms, as well as for splenomegaly.  She reported at that time that she stopped eating chocolate which led to resolution of her symptoms.  Patient was referred to hematology for evaluation of splenomegaly.  Was ordered an ultrasound of the abdomen which did not show previously found mass in the hepatic dome.  Repeat CT scan of the abdomen and pelvis with IV contrast on 09/12/2022 showed stable subcentimeter lesion in the liver dome consistent with a benign etiology.  Patient was referred to hematology for evaluation of splenomegaly, seen by Dr. Rogers who performed a PET scan on 09/26/2018 for which showed splenomegaly without hypermetabolism and prominent bilateral axillary and left external iliac lymph nodes.  Was discussed that her symptoms could be related to low-grade lymphoma but she agreed for observation with surveillance.  Patient comes to the office with her daughter.  Both state that they would like to have a second opinion regarding her splenomegaly.  Most recent available labs from 04/02/2023 showed a CMP with total bili 1.4, abnormalities 89, ALT 18, AST 20, albumin 3.9, normal electrolytes, creatinine 0.99,  BUN 23.  Iron  stores showed an iron  saturation of 11%, TIBC 349, ferritin 38.  RUQ US  11/2021 with enlarged spleen, no focal splenic lesions, normal GB, normal Bile ducts, liver with normal texture uniformly, portal vein unremarkable CT Abd with contrast 12/2021 with splenomegaly measuring 18cm in craniocaudal dimension, small hiatal hernia. Subcentimeter segment 4 hypodensity (2:8), too small to characterize.  Patient reports having fluctuation in her nausea for the last couple of months. She states she may have some nausea, but does not know if she has nausea with specific types of food. Patient reports that sometimes she takes salted water to improve her symptoms, which helps relieving her symptoms. She bought a patch online for nausea, but does not remember the name of the medication. She may have some episodes of vomiting, which usually come with diarrhea (does not know how many times).   She also has presented recurrent episodes of chest discomfort after having some food, but no dysphagia or odynophagia. She takes pantoprazole  40 mg qday but does not have any heartburn.   She feels discomfort in the area of splenomegaly.  The patient denies having any fever, chills, hematochezia, melena, hematemesis, abdominal distention, abdominal pain, diarrhea, jaundice, pruritus or weight loss.  Last Colonoscopy:04/16/18 diverticulosis, internal hemorrhoids  Last Endoscopy: never   Past Medical History: Past Medical History:  Diagnosis Date   Asthma    CAD (coronary artery disease)    60-70% proximal LAD/diagonal bifurcational disease October 2015 at [facility] status DES x 2 to LAD/diagonal October 2015   Essential hypertension    Heart murmur    Hyperlipidemia    Hypothyroidism    Sinus node dysfunction (HCC)    Biotronik pacemaker in place  Past Surgical History: Past Surgical History:  Procedure Laterality Date   ABDOMINAL HYSTERECTOMY     ANAL FISSURE REPAIR     CARDIAC CATHETERIZATION      CATARACT EXTRACTION W/PHACO Right 10/17/2014   Procedure: CATARACT EXTRACTION PHACO AND INTRAOCULAR LENS PLACEMENT (IOC);  Surgeon: Cherene Mania, MD;  Location: [facility];  Service: Ophthalmology;  Laterality: Right;  CDE: 4.88   CATARACT EXTRACTION W/PHACO Left 10/27/2014   Procedure: CATARACT EXTRACTION PHACO AND INTRAOCULAR LENS PLACEMENT (IOC);  Surgeon: Cherene Mania, MD;  Location: [facility] ORS;  Service: Ophthalmology;  Laterality: Left;  CDE:7.86   COLONOSCOPY N/A 04/16/2018   Procedure: COLONOSCOPY;  Surgeon: Golda Claudis PENNER, MD;  Location: [facility] ENDO SUITE;  Service: Endoscopy;  Laterality: N/A;  830   FINGER SURGERY     INSERT / REPLACE / REMOVE PACEMAKER     NASAL SINUS SURGERY     PACEMAKER IMPLANT N/A 07/30/2016   Procedure: Pacemaker Implant Dual Chamber;  Surgeon: Waddell Danelle ORN, MD;  Location: [facility] INVASIVE CV LAB;  Service: Cardiovascular;  Laterality: N/A;   TONSILLECTOMY      Family History: Family History  Problem Relation Age of Onset   Heart disease Mother    Parkinson's disease Mother    Heart disease Father     Social History: Social History   Tobacco Use  Smoking Status Never  Smokeless Tobacco Never   Social History   Substance and Sexual Activity  Alcohol  Use No   Social History   Substance and Sexual Activity  Drug Use No    Allergies: Allergies  Allergen Reactions   Sulfa Antibiotics Rash    Medications: Current Outpatient Medications  Medication Sig Dispense Refill   amLODipine  (NORVASC ) 10 MG tablet Take 1 tablet (10 mg total) by mouth every evening. 90 tablet 3   aspirin  81 MG tablet Take 81 mg by mouth daily.     atorvastatin (LIPITOR) 20 MG tablet TAKE 1 TABLET BY MOUTH EVERY DAY FOR CHOLESTEROL 90 tablet 3   betamethasone valerate ointment (VALISONE) 0.1 % APPLY TO THE AFFECTED AREA(S) TWICE DAILY (Patient taking differently: Apply topically daily. to affected area(s)) 45 g 2   calcium carbonate (OSCAL) 1500 (600 Ca) MG TABS tablet Take  1,500 mg by mouth daily with breakfast.     Cholecalciferol  (VITAMIN D3) 2000 units capsule Take 2,000 Units by mouth daily at 12 noon.      clobetasol  (TEMOVATE ) 0.05 % external solution Apply 1 application  topically as needed (psoriasis).     clopidogrel  (PLAVIX ) 75 MG tablet TAKE 1 TABLET BY MOUTH DAILY 90 tablet 1   cycloSPORINE (RESTASIS) 0.05 % ophthalmic emulsion 1 drop 2 (two) times daily.     hydrochlorothiazide (HYDRODIURIL) 12.5 MG tablet TAKE 1 TABLET BY MOUTH EVERY DAY HIGH BLOOD PRESSURE 90 tablet 3   isosorbide  mononitrate (IMDUR ) 60 MG 24 hr tablet TAKE 1 TABLET BY MOUTH DAILY 30 tablet 2   levothyroxine  (SYNTHROID ) 75 MCG tablet TAKE 1 TABLET BY MOUTH EVERY MORNING 90 tablet 3   magnesium  oxide (MAG-OX) 400 (240 Mg) MG tablet TAKE 1/2 TABLET BY MOUTH DAILY 45 tablet 1   Multiple Vitamin (MULTIVITAMIN WITH MINERALS) TABS tablet Take 1 tablet by mouth daily at 12 noon.      pantoprazole  (PROTONIX ) 40 MG tablet Take 1 tablet (40 mg total) by mouth daily. 90 tablet 3   triamcinolone ointment (KENALOG) 0.5 % Apply 1 Application topically daily at 6 (six) AM.     valsartan  (DIOVAN )  160 MG tablet Take 1 tablet (160 mg total) by mouth daily.     No current facility-administered medications for this visit.    Review of Systems: GENERAL: negative for malaise, night sweats HEENT: No changes in hearing or vision, no nose bleeds or other nasal problems. NECK: Negative for lumps, goiter, pain and significant neck swelling RESPIRATORY: Negative for cough, wheezing CARDIOVASCULAR: Negative for chest pain, leg swelling, palpitations, orthopnea GI: SEE HPI MUSCULOSKELETAL: Negative for joint pain or swelling, back pain, and muscle pain. SKIN: Negative for lesions, rash PSYCH: Negative for sleep disturbance, mood disorder and recent psychosocial stressors. HEMATOLOGY Negative for prolonged bleeding, bruising easily, and swollen nodes. ENDOCRINE: Negative for cold or heat intolerance,  polyuria, polydipsia and goiter. NEURO: negative for tremor, gait imbalance, syncope and seizures. The remainder of the review of systems is noncontributory.   Physical Exam: BP 131/77 (BP Location: Left Arm, Patient Position: Sitting, Cuff Size: Normal)   Pulse 69   Temp (!) 97.5 F (36.4 C) (Temporal)   Ht 4' 10.75 (1.492 m)   Wt 144 lb 3.2 oz (65.4 kg)   BMI 29.37 kg/m  GENERAL: The patient is AO x3, in no acute distress. HEENT: Head is normocephalic and atraumatic. EOMI are intact. Mouth is well hydrated and without lesions. NECK: Supple. No masses LUNGS: Clear to auscultation. No presence of rhonchi/wheezing/rales. Adequate chest expansion HEART: RRR, normal s1 and s2. ABDOMEN: Soft, nontender, no guarding, no peritoneal signs, and nondistended. BS +.  Presence of palpable spleen at least 2 cm below the left costal ridge which is nontender. EXTREMITIES: Without any cyanosis, clubbing, rash, lesions or edema. NEUROLOGIC: AOx3, no focal motor deficit. SKIN: no jaundice, no rashes  Imaging/Labs: as above  I personally reviewed and interpreted the available labs, imaging and endoscopic files.  Impression and Plan: Amy [name] is a 78 y.o.  female with past medical history of Asthma, CAD, HTN, HLD, Hypothyroidism, sinus node dysfunction with pacemaker in place, GERD, osteopenia, vertigo, lichen sclerosis, psoriasis who presents for evaluation of splenomegaly, diarrhea and nausea.  Patient presented recurrent issues with nausea of unclear etiology.  Imaging evaluation of her abdomen has shown stable splenomegaly for at least the last 2 years without presence of other intra-abdominal abnormalities.  I discussed with the patient that after reviewing the images, this could potentially compress her gastric chamber up to some point but it is not an overt compression that I can completely attribute her symptoms to.  Due to this, we discussed the possibility of proceeding with an EGD to  further evaluate her symptoms, which she is in agreement to.  For now we will start Zofran  as needed to improve her symptomatology.  In terms of her splenomegaly, per hematology this has been attributed to possible benign etiology versus low-grade slow-growing lymphoma.  She would like to have second opinion by a different hematologist, she will be referred to [facility] to discuss this further.  Patient is interested in undergoing possible splenectomy versus embolization of the spleen.  I discussed with her that there are some potential risks that need to be considered versus the potential benefits.  She will discuss this further with the hematologist.  -Start Zofran  4 mg q8h as needed for nausea -Schedule EGD -Referral to hematology at [facility] for 2nd opinion regarding splenomegaly  All questions were answered.      Amy Fortune, MD Gastroenterology and Hepatology [facility] Gastroenterology

## 2023-08-21 NOTE — Telephone Encounter (Signed)
  Request for patient to stop medication prior to procedure or is needing cleareance  08/21/23  Amy [name] October 07, 1945  What type of surgery is being performed? Esophagogastroduodenoscopy (EGD)   When is surgery scheduled? TBD  What type of clearance is required (medical or pharmacy to hold medication or both? medication  Are there any medications that need to be held prior to surgery and how long? Plavix  x 5 days  Name of physician performing surgery?  Agatha Rouse Gastroenterology at [facility] Phone: [phone] Fax: [phone]  Anethesia type (none, local, MAC, general)? MAC

## 2023-08-22 NOTE — Telephone Encounter (Signed)
   Patient Name: Amy [name]  DOB: 12-12-1945 MRN: [id number]  Primary Cardiologist: None  Chart reviewed as part of pre-operative protocol coverage. Patient pending EGD. Cardiology asked for medication clearance.  Per office protocol, if patient is without any new symptoms or concerns, he/she may hold Plavix  for 5 days prior to procedure. Please resume Plavix  as soon as possible postprocedure, at the discretion of the surgeon. We recommend patient take Aspirin  81 mg daily throughout the perioperative period.  Please discontinue Aspirin  upon resumption of Plavix .  I will route this recommendation to the requesting party via Epic fax function and remove from pre-op pool.  Please call with questions.  Artist Pouch, PA-C 08/22/2023, 8:15 AM

## 2023-08-25 DIAGNOSIS — H04123 Dry eye syndrome of bilateral lacrimal glands: Secondary | ICD-10-CM | POA: Diagnosis not present

## 2023-08-25 DIAGNOSIS — H16233 Neurotrophic keratoconjunctivitis, bilateral: Secondary | ICD-10-CM | POA: Diagnosis not present

## 2023-09-01 ENCOUNTER — Other Ambulatory Visit: Payer: Self-pay | Admitting: Cardiology

## 2023-09-09 NOTE — Telephone Encounter (Signed)
 Please advise her to stop Plavix  for 5 days prior to her EGD.  She can take baby aspirin  (81 mg) during those days instead. Thanks

## 2023-09-10 ENCOUNTER — Encounter: Payer: Self-pay | Admitting: *Deleted

## 2023-09-10 DIAGNOSIS — L280 Lichen simplex chronicus: Secondary | ICD-10-CM | POA: Diagnosis not present

## 2023-09-10 NOTE — Telephone Encounter (Signed)
 Pt has been scheduled for 09/30/23. Instructions mailed.

## 2023-09-11 ENCOUNTER — Other Ambulatory Visit: Payer: Self-pay | Admitting: *Deleted

## 2023-09-11 ENCOUNTER — Other Ambulatory Visit (HOSPITAL_COMMUNITY)
Admission: RE | Admit: 2023-09-11 | Discharge: 2023-09-11 | Disposition: A | Discharge status: Home / Self Care | Source: Ambulatory Visit | Attending: Gastroenterology | Admitting: Gastroenterology

## 2023-09-11 DIAGNOSIS — R112 Nausea with vomiting, unspecified: Secondary | ICD-10-CM

## 2023-09-11 LAB — BASIC METABOLIC PANEL WITH GFR
Anion gap: 12 (ref 5–15)
BUN: 21 mg/dL (ref 8–23)
CO2: 24 mmol/L (ref 22–32)
Calcium: 9.1 mg/dL (ref 8.9–10.3)
Chloride: 101 mmol/L (ref 98–111)
Creatinine, Ser: 0.91 mg/dL (ref 0.44–1.00)
GFR, Estimated: >60 mL/min (ref >60)
Glucose, Bld: 95 mg/dL (ref 70–99)
Potassium: 4.4 mmol/L (ref 3.5–5.1)
Sodium: 137 mmol/L (ref 135–145)

## 2023-09-17 ENCOUNTER — Encounter: Payer: Self-pay | Admitting: *Deleted

## 2023-09-23 ENCOUNTER — Ambulatory Visit (INDEPENDENT_AMBULATORY_CARE_PROVIDER_SITE_OTHER): Admitting: Gastroenterology

## 2023-09-25 ENCOUNTER — Encounter: Payer: Self-pay | Admitting: Internal Medicine

## 2023-09-25 NOTE — Progress Notes (Signed)
 PERIOPERATIVE PRESCRIPTION FOR IMPLANTED CARDIAC DEVICE PROGRAMMING  Patient Information: Name:  Amy [name]  DOB:  [birth year]  MRN:  [id number]    Planned Procedure: EGD Surgeon: Dr. Toribio Fortune Date of Procedure: 09/30/23 Cautery will be used. Position during surgery: Left lateral   Device Information:  Clinic EP Physician:  Danelle Birmingham, MD   Device Type:  Pacemaker Manufacturer and Phone #:  Biotronik: [phone] Pacemaker Dependent?:  Yes.   Date of Last Device Check:  08/01/23 Normal Device Function?:  Yes.    Electrophysiologist's Recommendations:  Have magnet available. Provide continuous ECG monitoring when magnet is used or reprogramming is to be performed.  Procedure may interfere with device function.  Magnet should be placed over device during procedure.  Per Device Clinic [address] [place], [place]  11:29 AM 09/25/2023

## 2023-09-26 ENCOUNTER — Encounter (HOSPITAL_COMMUNITY)
Admission: RE | Admit: 2023-09-26 | Discharge: 2023-09-26 | Disposition: A | Discharge status: Home / Self Care | Source: Ambulatory Visit | Attending: Gastroenterology | Admitting: Gastroenterology

## 2023-09-26 ENCOUNTER — Encounter (HOSPITAL_COMMUNITY): Payer: Self-pay

## 2023-09-26 HISTORY — DX: Nausea with vomiting, unspecified: R11.2

## 2023-09-26 HISTORY — DX: Presence of cardiac pacemaker: Z95.0

## 2023-09-26 HISTORY — DX: Splenomegaly, not elsewhere classified: R16.1

## 2023-09-26 HISTORY — DX: Other specified postprocedural states: Z98.890

## 2023-09-30 ENCOUNTER — Ambulatory Visit (HOSPITAL_COMMUNITY): Admitting: Anesthesiology

## 2023-09-30 ENCOUNTER — Ambulatory Visit (HOSPITAL_BASED_OUTPATIENT_CLINIC_OR_DEPARTMENT_OTHER): Admitting: Anesthesiology

## 2023-09-30 ENCOUNTER — Encounter (HOSPITAL_COMMUNITY)
Admission: RE | Disposition: A | Discharge status: Home / Self Care | Payer: Self-pay | Source: Home / Self Care | Attending: Gastroenterology

## 2023-09-30 ENCOUNTER — Other Ambulatory Visit: Payer: Self-pay

## 2023-09-30 ENCOUNTER — Encounter (HOSPITAL_COMMUNITY): Payer: Self-pay | Admitting: Gastroenterology

## 2023-09-30 ENCOUNTER — Ambulatory Visit (HOSPITAL_COMMUNITY)
Admission: RE | Admit: 2023-09-30 | Discharge: 2023-09-30 | Disposition: A | Discharge status: Home / Self Care | Attending: Gastroenterology | Admitting: Gastroenterology

## 2023-09-30 DIAGNOSIS — I739 Peripheral vascular disease, unspecified: Secondary | ICD-10-CM | POA: Diagnosis not present

## 2023-09-30 DIAGNOSIS — I10 Essential (primary) hypertension: Secondary | ICD-10-CM | POA: Insufficient documentation

## 2023-09-30 DIAGNOSIS — E039 Hypothyroidism, unspecified: Secondary | ICD-10-CM | POA: Diagnosis not present

## 2023-09-30 DIAGNOSIS — L409 Psoriasis, unspecified: Secondary | ICD-10-CM | POA: Insufficient documentation

## 2023-09-30 DIAGNOSIS — I2510 Atherosclerotic heart disease of native coronary artery without angina pectoris: Secondary | ICD-10-CM

## 2023-09-30 DIAGNOSIS — L900 Lichen sclerosus et atrophicus: Secondary | ICD-10-CM | POA: Insufficient documentation

## 2023-09-30 DIAGNOSIS — R112 Nausea with vomiting, unspecified: Secondary | ICD-10-CM | POA: Insufficient documentation

## 2023-09-30 DIAGNOSIS — E785 Hyperlipidemia, unspecified: Secondary | ICD-10-CM | POA: Insufficient documentation

## 2023-09-30 DIAGNOSIS — K219 Gastro-esophageal reflux disease without esophagitis: Secondary | ICD-10-CM | POA: Diagnosis not present

## 2023-09-30 DIAGNOSIS — K449 Diaphragmatic hernia without obstruction or gangrene: Secondary | ICD-10-CM

## 2023-09-30 DIAGNOSIS — K2970 Gastritis, unspecified, without bleeding: Secondary | ICD-10-CM | POA: Diagnosis not present

## 2023-09-30 DIAGNOSIS — Z950 Presence of cardiac pacemaker: Secondary | ICD-10-CM | POA: Insufficient documentation

## 2023-09-30 DIAGNOSIS — R1013 Epigastric pain: Secondary | ICD-10-CM

## 2023-09-30 DIAGNOSIS — K3189 Other diseases of stomach and duodenum: Secondary | ICD-10-CM | POA: Diagnosis not present

## 2023-09-30 HISTORY — PX: ESOPHAGOGASTRODUODENOSCOPY: SHX5428

## 2023-09-30 SURGERY — EGD (ESOPHAGOGASTRODUODENOSCOPY)
Anesthesia: General

## 2023-09-30 MED ORDER — PROPOFOL 10 MG/ML IV BOLUS
INTRAVENOUS | Status: DC | PRN
  Administered 2023-09-30: 30 mg via INTRAVENOUS

## 2023-09-30 MED ORDER — PROPOFOL 500 MG/50ML IV EMUL
INTRAVENOUS | Status: DC | PRN
  Administered 2023-09-30: 125 ug/kg/min via INTRAVENOUS

## 2023-09-30 MED ORDER — LIDOCAINE 2% (20 MG/ML) 5 ML SYRINGE
INTRAMUSCULAR | Status: DC | PRN
  Administered 2023-09-30: 80 mg via INTRAVENOUS

## 2023-09-30 MED ORDER — LACTATED RINGERS IV SOLN: INTRAVENOUS | Status: DC | PRN

## 2023-09-30 NOTE — Op Note (Signed)
 [facility] Patient Name: Amy [name] Procedure Date: 09/30/2023 10:55 AM MRN: [id number] Date of Birth: Sep 03, 1945 Attending MD: Toribio Fortune , , [id number] CSN: [id number] Age: 78 Admit Type: Outpatient Procedure:                Upper GI endoscopy Indications:              Epigastric abdominal pain, Nausea with vomiting Providers:                Toribio Fortune, Olam Ada, RN, Daphne Mulch                            Technician, Technician Referring MD:              Medicines:                Monitored Anesthesia Care Complications:            No immediate complications. Estimated Blood Loss:     Estimated blood loss: none. Procedure:                Pre-Anesthesia Assessment:                           - Prior to the procedure, a History and Physical                            was performed, and patient medications, allergies                            and sensitivities were reviewed. The patient's                            tolerance of previous anesthesia was reviewed.                           - The risks and benefits of the procedure and the                            sedation options and risks were discussed with the                            patient. All questions were answered and informed                            consent was obtained.                           - ASA Grade Assessment: III - A patient with severe                            systemic disease.                           After obtaining informed consent, the endoscope was                            passed under direct vision. Throughout  the                            procedure, the patient's blood pressure, pulse, and                            oxygen saturations were monitored continuously. The                            [id number] ([id number]) Upper was introduced through                            the mouth, and advanced to the second part of                            duodenum. The upper GI endoscopy was  accomplished                            without difficulty. The patient tolerated the                            procedure well. Scope In: 11:14:41 AM Scope Out: 11:20:16 AM Total Procedure Duration: 0 hours 5 minutes 35 seconds  Findings:      A 2 cm hiatal hernia was present.      Segmental mild inflammation characterized by erythema was found in the       gastric antrum. Biopsies were taken with a cold forceps for Helicobacter       pylori testing.      The examined duodenum was normal. Impression:               - 2 cm hiatal hernia.                           - Gastritis. Biopsied.                           - Normal examined duodenum. Moderate Sedation:      Per Anesthesia Care Recommendation:           - Discharge patient to home (ambulatory).                           - Resume previous diet.                           - Await pathology results.                           - Continue present medications.                           -Follow up with oncologist regarding splenomegaly Procedure Code(s):        --- Professional ---                           [phone], Esophagogastroduodenoscopy, flexible,  transoral; with biopsy, single or multiple Diagnosis Code(s):        --- Professional ---                           K44.9, Diaphragmatic hernia without obstruction or                            gangrene                           K29.70, Gastritis, unspecified, without bleeding                           R10.13, Epigastric pain                           R11.2, Nausea with vomiting, unspecified CPT copyright 2022 American Medical Association. All rights reserved. The codes documented in this report are preliminary and upon coder review may  be revised to meet current compliance requirements. Toribio Fortune, MD Toribio Fortune,  09/30/2023 11:26:02 AM This report has been signed electronically. Number of Addenda: 0

## 2023-09-30 NOTE — H&P (Signed)
 Amy [name] is an 78 y.o. female.   Chief Complaint:  abdominal pain, diarrhea and nausea.  HPI: Amy [name] is a 78 y.o.  female with past medical history of Asthma, CAD, HTN, HLD, Hypothyroidism, sinus node dysfunction with pacemaker in place, GERD, osteopenia, vertigo, lichen sclerosis, psoriasis who presents for evaluation of abdominal pain, diarrhea and nausea.   Endorses having episodes of abdominal pressure in the epigastric area with food intake, has been limiting the type of food that she eats.  No presented any more episodes of nausea/vomiting since she saw me in the office.  Previously reported some episodes of diarrhea with these episodes of vomiting.  The patient denies having any fever, chills, hematochezia, melena, hematemesis, abdominal distention, abdominal pain, jaundice, pruritus or weight loss.   Past Medical History:  Diagnosis Date   Asthma    CAD (coronary artery disease)    60-70% proximal LAD/diagonal bifurcational disease October 2015 at [facility] status DES x 2 to LAD/diagonal October 2015   Essential hypertension    Heart murmur    Hyperlipidemia    Hypothyroidism    PONV (postoperative nausea and vomiting)    Presence of permanent cardiac pacemaker    Sinus node dysfunction (HCC)    Biotronik pacemaker in place   Spleen enlarged     Past Surgical History:  Procedure Laterality Date   ABDOMINAL HYSTERECTOMY     ANAL FISSURE REPAIR     CARDIAC CATHETERIZATION     CATARACT EXTRACTION W/PHACO Right 10/17/2014   Procedure: CATARACT EXTRACTION PHACO AND INTRAOCULAR LENS PLACEMENT (IOC);  Surgeon: Cherene Mania, MD;  Location: [facility];  Service: Ophthalmology;  Laterality: Right;  CDE: 4.88   CATARACT EXTRACTION W/PHACO Left 10/27/2014   Procedure: CATARACT EXTRACTION PHACO AND INTRAOCULAR LENS PLACEMENT (IOC);  Surgeon: Cherene Mania, MD;  Location: [facility];  Service: Ophthalmology;  Laterality: Left;  CDE:7.86   COLONOSCOPY N/A 04/16/2018   Procedure: COLONOSCOPY;   Surgeon: Golda Claudis PENNER, MD;  Location: [facility];  Service: Endoscopy;  Laterality: N/A;  830   FINGER SURGERY     INSERT / REPLACE / REMOVE PACEMAKER     NASAL SINUS SURGERY     PACEMAKER IMPLANT N/A 07/30/2016   Procedure: Pacemaker Implant Dual Chamber;  Surgeon: Waddell Danelle ORN, MD;  Location[facility] INVASIVE CV LAB;  Service: Cardiovascular;  Laterality: N/A;   TONSILLECTOMY      Family History  Problem Relation Age of Onset   Heart disease Mother    Parkinson's disease Mother    Heart disease Father    Social History:  reports that she has never smoked. She has never used smokeless tobacco. She reports that she does not drink alcohol  and does not use drugs.  Allergies:  Allergies  Allergen Reactions   Sulfa Antibiotics Rash    Medications Prior to Admission  Medication Sig Dispense Refill   amLODipine  (NORVASC ) 10 MG tablet Take 1 tablet (10 mg total) by mouth every evening. 90 tablet 3   aspirin  81 MG tablet Take 81 mg by mouth daily.     atorvastatin (LIPITOR) 20 MG tablet TAKE 1 TABLET BY MOUTH EVERY DAY FOR CHOLESTEROL 90 tablet 3   calcium carbonate (OSCAL) 1500 (600 Ca) MG TABS tablet Take 1,500 mg by mouth daily with breakfast.     Cholecalciferol  (VITAMIN D3) 2000 units capsule Take 2,000 Units by mouth daily at 12 noon.      clobetasol  (TEMOVATE ) 0.05 % external solution Apply 1 application  topically as needed (psoriasis).     cycloSPORINE (RESTASIS) 0.05 % ophthalmic emulsion 1 drop 2 (two) times daily.     hydrochlorothiazide (HYDRODIURIL) 12.5 MG tablet TAKE 1 TABLET BY MOUTH EVERY DAY HIGH BLOOD PRESSURE 90 tablet 3   isosorbide  mononitrate (IMDUR ) 60 MG 24 hr tablet TAKE 1 TABLET BY MOUTH DAILY 30 tablet 2   levothyroxine  (SYNTHROID ) 75 MCG tablet TAKE 1 TABLET BY MOUTH EVERY MORNING 90 tablet 3   magnesium  oxide (MAG-OX) 400 (240 Mg) MG tablet TAKE 1/2 TABLET BY MOUTH DAILY 45 tablet 1   ondansetron  (ZOFRAN -ODT) 4 MG disintegrating tablet Take 1 tablet (4  mg total) by mouth every 8 (eight) hours as needed for nausea or vomiting. 60 tablet 3   pantoprazole  (PROTONIX ) 40 MG tablet Take 1 tablet (40 mg total) by mouth daily. 90 tablet 3   valsartan  (DIOVAN ) 160 MG tablet Take 1 tablet (160 mg total) by mouth daily.     betamethasone valerate ointment (VALISONE) 0.1 % APPLY TO THE AFFECTED AREA(S) TWICE DAILY (Patient taking differently: Apply topically daily. to affected area(s)) 45 g 2   clopidogrel  (PLAVIX ) 75 MG tablet TAKE 1 TABLET BY MOUTH DAILY 90 tablet 3   Multiple Vitamin (MULTIVITAMIN WITH MINERALS) TABS tablet Take 1 tablet by mouth daily at 12 noon.      triamcinolone ointment (KENALOG) 0.5 % Apply 1 Application topically daily at 6 (six) AM.      No results found for this or any previous visit (from the past 48 hours). No results found.  Review of Systems  All other systems reviewed and are negative.   Blood pressure (!) 152/55, pulse 69, temperature 97.7 F (36.5 C), temperature source Oral, resp. rate 18, height 4' 11 (1.499 m), weight 64.4 kg, SpO2 99%. Physical Exam  GENERAL: The patient is AO x3, in no acute distress. HEENT: Head is normocephalic and atraumatic. EOMI are intact. Mouth is well hydrated and without lesions. NECK: Supple. No masses LUNGS: Clear to auscultation. No presence of rhonchi/wheezing/rales. Adequate chest expansion HEART: RRR, normal s1 and s2. ABDOMEN: Soft, nontender, no guarding, no peritoneal signs, and nondistended. BS +. No masses. EXTREMITIES: Without any cyanosis, clubbing, rash, lesions or edema. NEUROLOGIC: AOx3, no focal motor deficit. SKIN: no jaundice, no rashes  Assessment/Plan TRUTH WOLAVER is a 78 y.o.  female with past medical history of Asthma, CAD, HTN, HLD, Hypothyroidism, sinus node dysfunction with pacemaker in place, GERD, osteopenia, vertigo, lichen sclerosis, psoriasis who presents for evaluation of abdominal pain, diarrhea and nausea.  Will proceed with EGD.  Toribio Eartha Flavors, MD 09/30/2023, 10:10 AM

## 2023-09-30 NOTE — Anesthesia Preprocedure Evaluation (Signed)
 Anesthesia Evaluation  Patient identified by MRN, date of birth, ID band Patient awake    Reviewed: Allergy  & Precautions, H&P , NPO status , Patient's Chart, lab work & pertinent test results, reviewed documented beta blocker date and time   History of Anesthesia Complications (+) PONV and history of anesthetic complications  Airway Mallampati: II  TM Distance: >3 FB Neck ROM: full    Dental no notable dental hx.    Pulmonary neg pulmonary ROS, asthma    Pulmonary exam normal breath sounds clear to auscultation       Cardiovascular Exercise Tolerance: Good hypertension, + CAD and + Peripheral Vascular Disease  negative cardio ROS + pacemaker + Valvular Problems/Murmurs  Rhythm:regular Rate:Normal     Neuro/Psych negative neurological ROS  negative psych ROS   GI/Hepatic negative GI ROS, Neg liver ROS,GERD  ,,  Endo/Other  negative endocrine ROSHypothyroidism    Renal/GU negative Renal ROS  negative genitourinary   Musculoskeletal   Abdominal   Peds  Hematology negative hematology ROS (+) Blood dyscrasia, anemia   Anesthesia Other Findings   Reproductive/Obstetrics negative OB ROS                              Anesthesia Physical Anesthesia Plan  ASA: 3  Anesthesia Plan: General   Post-op Pain Management:    Induction:   PONV Risk Score and Plan: Propofol  infusion  Airway Management Planned:   Additional Equipment:   Intra-op Plan:   Post-operative Plan:   Informed Consent: I have reviewed the patients History and Physical, chart, labs and discussed the procedure including the risks, benefits and alternatives for the proposed anesthesia with the patient or authorized representative who has indicated his/her understanding and acceptance.     Dental Advisory Given  Plan Discussed with: CRNA  Anesthesia Plan Comments:         Anesthesia Quick Evaluation

## 2023-09-30 NOTE — Discharge Instructions (Addendum)
 You are being discharged to home.  Resume your previous diet.  We are waiting for your pathology results.  Continue your present medications.  Follow up with oncologist regarding splenomegaly

## 2023-09-30 NOTE — Transfer of Care (Signed)
 Immediate Anesthesia Transfer of Care Note  Patient: Amy [name]  Procedure(s) Performed: EGD (ESOPHAGOGASTRODUODENOSCOPY)  Patient Location: PACU  Anesthesia Type:MAC  Level of Consciousness: awake and alert   Airway & Oxygen Therapy: Patient Spontanous Breathing and Patient connected to nasal cannula oxygen  Post-op Assessment: Report given to RN and Post -op Vital signs reviewed and stable  Post vital signs: Reviewed  Last Vitals:  Vitals Value Taken Time  BP 105/34 09/30/23 11:26  Temp 36.5 C 09/30/23 11:26  Pulse 67 09/30/23 11:26  Resp 17 09/30/23 11:26  SpO2 98 % 09/30/23 11:26    Last Pain:  Vitals:   09/30/23 1126  TempSrc: Oral  PainSc: 0-No pain         Complications: No notable events documented.

## 2023-09-30 NOTE — Anesthesia Postprocedure Evaluation (Signed)
 Anesthesia Post Note  Patient: Jarissa A Skibinski  Procedure(s) Performed: EGD (ESOPHAGOGASTRODUODENOSCOPY)  Patient location during evaluation: Phase II Anesthesia Type: General Level of consciousness: awake Pain management: pain level controlled Vital Signs Assessment: post-procedure vital signs reviewed and stable Respiratory status: spontaneous breathing and respiratory function stable Cardiovascular status: blood pressure returned to baseline and stable Postop Assessment: no headache and no apparent nausea or vomiting Anesthetic complications: no Comments: Late entry   No notable events documented.   Last Vitals:  Vitals:   09/30/23 1126 09/30/23 1133  BP: (!) 105/34 (!) 118/46  Pulse: 67   Resp: 17   Temp: 36.5 C   SpO2: 98%     Last Pain:  Vitals:   09/30/23 1126  TempSrc: Oral  PainSc: 0-No pain                 Yvonna JINNY Bosworth

## 2023-10-01 ENCOUNTER — Other Ambulatory Visit: Payer: Self-pay | Admitting: Internal Medicine

## 2023-10-01 ENCOUNTER — Ambulatory Visit: Payer: Self-pay | Admitting: Gastroenterology

## 2023-10-01 DIAGNOSIS — I10 Essential (primary) hypertension: Secondary | ICD-10-CM

## 2023-10-01 LAB — SURGICAL PATHOLOGY

## 2023-10-02 ENCOUNTER — Encounter (HOSPITAL_COMMUNITY): Payer: Self-pay | Admitting: Gastroenterology

## 2023-10-02 ENCOUNTER — Inpatient Hospital Stay: Attending: Hematology and Oncology | Admitting: Hematology and Oncology

## 2023-10-02 ENCOUNTER — Inpatient Hospital Stay

## 2023-10-02 VITALS — BP 160/64 | HR 80 | Temp 97.6°F | Resp 16 | Wt 146.3 lb

## 2023-10-02 DIAGNOSIS — E785 Hyperlipidemia, unspecified: Secondary | ICD-10-CM | POA: Diagnosis not present

## 2023-10-02 DIAGNOSIS — D500 Iron deficiency anemia secondary to blood loss (chronic): Secondary | ICD-10-CM

## 2023-10-02 DIAGNOSIS — I129 Hypertensive chronic kidney disease with stage 1 through stage 4 chronic kidney disease, or unspecified chronic kidney disease: Secondary | ICD-10-CM | POA: Insufficient documentation

## 2023-10-02 DIAGNOSIS — Z7982 Long term (current) use of aspirin: Secondary | ICD-10-CM | POA: Insufficient documentation

## 2023-10-02 DIAGNOSIS — Z7902 Long term (current) use of antithrombotics/antiplatelets: Secondary | ICD-10-CM | POA: Insufficient documentation

## 2023-10-02 DIAGNOSIS — N189 Chronic kidney disease, unspecified: Secondary | ICD-10-CM | POA: Insufficient documentation

## 2023-10-02 DIAGNOSIS — R161 Splenomegaly, not elsewhere classified: Secondary | ICD-10-CM | POA: Diagnosis not present

## 2023-10-02 DIAGNOSIS — E039 Hypothyroidism, unspecified: Secondary | ICD-10-CM | POA: Diagnosis not present

## 2023-10-02 DIAGNOSIS — Z79890 Hormone replacement therapy (postmenopausal): Secondary | ICD-10-CM | POA: Diagnosis not present

## 2023-10-02 DIAGNOSIS — D649 Anemia, unspecified: Secondary | ICD-10-CM | POA: Insufficient documentation

## 2023-10-02 DIAGNOSIS — I2510 Atherosclerotic heart disease of native coronary artery without angina pectoris: Secondary | ICD-10-CM | POA: Insufficient documentation

## 2023-10-02 DIAGNOSIS — J45909 Unspecified asthma, uncomplicated: Secondary | ICD-10-CM | POA: Diagnosis not present

## 2023-10-02 DIAGNOSIS — Z79899 Other long term (current) drug therapy: Secondary | ICD-10-CM | POA: Diagnosis not present

## 2023-10-02 LAB — CBC WITH DIFFERENTIAL (CANCER CENTER ONLY)
Abs Immature Granulocytes: 0.01 K/uL (ref 0.00–0.07)
Basophils Absolute: 0 K/uL (ref 0.0–0.1)
Basophils Relative: 1 %
Eosinophils Absolute: 0.2 K/uL (ref 0.0–0.5)
Eosinophils Relative: 4 %
HCT: 35.5 % — ABNORMAL LOW (ref 36.0–46.0)
Hemoglobin: 12.4 g/dL (ref 12.0–15.0)
Immature Granulocytes: 0 %
Lymphocytes Relative: 27 %
Lymphs Abs: 1.5 K/uL (ref 0.7–4.0)
MCH: 32.4 pg (ref 26.0–34.0)
MCHC: 34.9 g/dL (ref 30.0–36.0)
MCV: 92.7 fL (ref 80.0–100.0)
Monocytes Absolute: 0.5 K/uL (ref 0.1–1.0)
Monocytes Relative: 9 %
Neutro Abs: 3.3 K/uL (ref 1.7–7.7)
Neutrophils Relative %: 59 %
Platelet Count: 151 K/uL (ref 150–400)
RBC: 3.83 MIL/uL — ABNORMAL LOW (ref 3.87–5.11)
RDW: 12.6 % (ref 11.5–15.5)
WBC Count: 5.6 K/uL (ref 4.0–10.5)
nRBC: 0 % (ref 0.0–0.2)

## 2023-10-02 LAB — CMP (CANCER CENTER ONLY)
ALT: 16 U/L (ref 0–44)
AST: 18 U/L (ref 15–41)
Albumin: 4.1 g/dL (ref 3.5–5.0)
Alkaline Phosphatase: 106 U/L (ref 38–126)
Anion gap: 6 (ref 5–15)
BUN: 20 mg/dL (ref 8–23)
CO2: 28 mmol/L (ref 22–32)
Calcium: 9.3 mg/dL (ref 8.9–10.3)
Chloride: 104 mmol/L (ref 98–111)
Creatinine: 0.88 mg/dL (ref 0.44–1.00)
GFR, Estimated: >60 mL/min (ref >60)
Glucose, Bld: 93 mg/dL (ref 70–99)
Potassium: 4.4 mmol/L (ref 3.5–5.1)
Sodium: 138 mmol/L (ref 135–145)
Total Bilirubin: 1.6 mg/dL — ABNORMAL HIGH (ref 0.0–1.2)
Total Protein: 6.4 g/dL — ABNORMAL LOW (ref 6.5–8.1)

## 2023-10-02 LAB — RETIC PANEL
Immature Retic Fract: 7 % (ref 2.3–15.9)
RBC.: 3.78 MIL/uL — ABNORMAL LOW (ref 3.87–5.11)
Retic Count, Absolute: 89.6 K/uL (ref 19.0–186.0)
Retic Ct Pct: 2.4 % (ref 0.4–3.1)
Reticulocyte Hemoglobin: 35.9 pg (ref >27.9)

## 2023-10-02 LAB — IRON AND IRON BINDING CAPACITY (CC-WL,HP ONLY)
Iron: 64 ug/dL (ref 28–170)
Saturation Ratios: 21 % (ref 10.4–31.8)
TIBC: 300 ug/dL (ref 250–450)
UIBC: 236 ug/dL (ref 148–442)

## 2023-10-02 LAB — LACTATE DEHYDROGENASE: LDH: 153 U/L (ref 98–192)

## 2023-10-02 LAB — FERRITIN: Ferritin: 214 ng/mL (ref 11–307)

## 2023-10-02 NOTE — Progress Notes (Signed)
 [facility] [phone]   [phone]  INITIAL CONSULT NOTE  Patient Care Team: Bevely Doffing, FNP as PCP - General (Family Medicine) Center, [facility] as Attending Physician Waddell Danelle ORN, MD as Consulting Physician (Cardiology) Harrie Agent, MD as Consulting Physician (Ophthalmology) Debera Jayson MATSU, MD as Consulting Physician (Cardiology) Court Dorn PARAS, MD as Consulting Physician (Cardiology) Olegario Messier, MD as Referring Physician (Obstetrics and Gynecology)  Hematological/Oncological History # Splenomegaly, Chronic  04/10/2023: last visit with Dr. Rogers at [facility] 10/02/2023: establish care with Dr. Federico   CHIEF COMPLAINTS/PURPOSE OF CONSULTATION:  Splenomegaly   HISTORY OF PRESENTING ILLNESS:  Amy [name] 78 y.o. female with medical history significant for coronary artery disease, hypertension, hyperlipidemia, hypothyroidism, and pacemaker placement who presents for establishing care for evaluation of chronic splenomegaly.  On review of the previous records Amy [name] was previously followed by Dr. Rogers with her last visit being on 04/10/2023.  She has had splenomegaly since at least 2023.  She underwent PET CT scan on 09/27/2022 which showed no hyperactivity in the spleen but some modest FDG avidity in lymph nodes.  She subsequently underwent observation with repeat imaging in February 2025.  At that time she was noted to have splenomegaly measuring 18 cm.  Due to Dr. Katragadda leaving the practice the patient presents today to establish care with a new provider.  On exam today Amy [name] reports she has been having some difficulty with GERD for which she is following with gastroenterology.  She also has a history of a hiatal hernia.  She notes that her most recent flare was likely due to eating tomatoes.  Her weight however has been increasing and she is up to 146 pounds.  She reports her appetite is good it is  simply the heartburn symptoms that are bothering her.  She reports that she remains very active and has good energy levels.  She has had no trouble with fevers, chills, sweats, nausea, vomiting or diarrhea.  She does feel some abdominal distention does not have any other symptoms at this time.  A full 10 point ROS is otherwise negative.  MEDICAL HISTORY:  Past Medical History:  Diagnosis Date   Asthma    CAD (coronary artery disease)    60-70% proximal LAD/diagonal bifurcational disease October 2015 at [facility] status DES x 2 to LAD/diagonal October 2015   Essential hypertension    Heart murmur    Hyperlipidemia    Hypothyroidism    PONV (postoperative nausea and vomiting)    Presence of permanent cardiac pacemaker    Sinus node dysfunction (HCC)    Biotronik pacemaker in place   Spleen enlarged     SURGICAL HISTORY: Past Surgical History:  Procedure Laterality Date   ABDOMINAL HYSTERECTOMY     ANAL FISSURE REPAIR     CARDIAC CATHETERIZATION     CATARACT EXTRACTION W/PHACO Right 10/17/2014   Procedure: CATARACT EXTRACTION PHACO AND INTRAOCULAR LENS PLACEMENT (IOC);  Surgeon: Cherene Mania, MD;  Location: [facility];  Service: Ophthalmology;  Laterality: Right;  CDE: 4.88   CATARACT EXTRACTION W/PHACO Left 10/27/2014   Procedure: CATARACT EXTRACTION PHACO AND INTRAOCULAR LENS PLACEMENT (IOC);  Surgeon: Cherene Mania, MD;  Location: [facility];  Service: Ophthalmology;  Laterality: Left;  CDE:7.86   COLONOSCOPY N/A 04/16/2018   Procedure: COLONOSCOPY;  Surgeon: Golda Claudis PENNER, MD;  Location: [facility];  Service: Endoscopy;  Laterality: N/A;  830   ESOPHAGOGASTRODUODENOSCOPY N/A 09/30/2023   Procedure: EGD (ESOPHAGOGASTRODUODENOSCOPY);  Surgeon: Eartha Flavors, Toribio, MD;  Location: [facility];  Service: Gastroenterology;  Laterality: N/A;  10:15 am, asa 1-2   FINGER SURGERY     INSERT / REPLACE / REMOVE PACEMAKER     NASAL SINUS SURGERY     PACEMAKER IMPLANT N/A 07/30/2016    Procedure: Pacemaker Implant Dual Chamber;  Surgeon: Waddell Danelle ORN, MD;  Location: [facility] INVASIVE CV LAB;  Service: Cardiovascular;  Laterality: N/A;   TONSILLECTOMY      SOCIAL HISTORY: Social History   Socioeconomic History   Marital status: Widowed    Spouse name: Not on file   Number of children: 1   Years of education: Not on file   Highest education level: Not on file  Occupational History   Not on file  Tobacco Use   Smoking status: Never   Smokeless tobacco: Never  Vaping Use   Vaping status: Never Used  Substance and Sexual Activity   Alcohol  use: No   Drug use: No   Sexual activity: Not Currently    Birth control/protection: None  Other Topics Concern   Not on file  Social History Narrative   Not on file   Social Drivers of Health   Financial Resource Strain: Low Risk  (08/20/2023)   Overall Financial Resource Strain (CARDIA)    Difficulty of Paying Living Expenses: Not hard at all  Food Insecurity: No Food Insecurity (10/02/2023)   Hunger Vital Sign    Worried About Running Out of Food in the Last Year: Never true    Ran Out of Food in the Last Year: Never true  Transportation Needs: No Transportation Needs (10/02/2023)   PRAPARE - Administrator, Civil Service (Medical): No    Lack of Transportation (Non-Medical): No  Physical Activity: Insufficiently Active (08/20/2023)   Exercise Vital Sign    Days of Exercise per Week: 2 days    Minutes of Exercise per Session: 20 min  Stress: No Stress Concern Present (08/20/2023)   [name] of Occupational Health - Occupational Stress Questionnaire    Feeling of Stress: Not at all  Social Connections: Moderately Integrated (08/20/2023)   Social Connection and Isolation Panel    Frequency of Communication with Friends and Family: More than three times a week    Frequency of Social Gatherings with Friends and Family: More than three times a week    Attends Religious Services: More than 4 times per year     Active Member of [employer] or Organizations: Yes    Attends Banker Meetings: More than 4 times per year    Marital Status: Widowed  Intimate Partner Violence: Not At Risk (10/02/2023)   Humiliation, Afraid, Rape, and Kick questionnaire    Fear of Current or Ex-Partner: No    Emotionally Abused: No    Physically Abused: No    Sexually Abused: No    FAMILY HISTORY: Family History  Problem Relation Age of Onset   Heart disease Mother    Parkinson's disease Mother    Heart disease Father     ALLERGIES:  is allergic to sulfa antibiotics.  MEDICATIONS:  Current Outpatient Medications  Medication Sig Dispense Refill   amLODipine  (NORVASC ) 10 MG tablet Take 1 tablet (10 mg total) by mouth every evening. 90 tablet 3   aspirin  81 MG tablet Take 81 mg by mouth daily.     atorvastatin (LIPITOR) 20 MG tablet TAKE 1 TABLET BY MOUTH EVERY DAY FOR CHOLESTEROL 90 tablet 3  betamethasone valerate ointment (VALISONE) 0.1 % APPLY TO THE AFFECTED AREA(S) TWICE DAILY (Patient taking differently: Apply topically daily. to affected area(s)) 45 g 2   calcium carbonate (OSCAL) 1500 (600 Ca) MG TABS tablet Take 1,500 mg by mouth daily with breakfast.     Cholecalciferol  (VITAMIN D3) 2000 units capsule Take 2,000 Units by mouth daily at 12 noon.      clobetasol  (TEMOVATE ) 0.05 % external solution Apply 1 application  topically as needed (psoriasis).     clopidogrel  (PLAVIX ) 75 MG tablet TAKE 1 TABLET BY MOUTH DAILY 90 tablet 3   cycloSPORINE (RESTASIS) 0.05 % ophthalmic emulsion 1 drop 2 (two) times daily.     hydrochlorothiazide (HYDRODIURIL) 12.5 MG tablet TAKE 1 TABLET BY MOUTH EVERY DAY HIGH BLOOD PRESSURE 90 tablet 3   isosorbide  mononitrate (IMDUR ) 60 MG 24 hr tablet TAKE 1 TABLET BY MOUTH DAILY 30 tablet 2   levothyroxine  (SYNTHROID ) 75 MCG tablet TAKE 1 TABLET BY MOUTH EVERY MORNING 90 tablet 3   magnesium  oxide (MAG-OX) 400 (240 Mg) MG tablet TAKE 1/2 TABLET BY MOUTH DAILY 45 tablet 1    Multiple Vitamin (MULTIVITAMIN WITH MINERALS) TABS tablet Take 1 tablet by mouth daily at 12 noon.      ondansetron  (ZOFRAN -ODT) 4 MG disintegrating tablet Take 1 tablet (4 mg total) by mouth every 8 (eight) hours as needed for nausea or vomiting. 60 tablet 3   pantoprazole  (PROTONIX ) 40 MG tablet Take 1 tablet (40 mg total) by mouth daily. 90 tablet 3   triamcinolone ointment (KENALOG) 0.5 % Apply 1 Application topically daily at 6 (six) AM.     valsartan  (DIOVAN ) 160 MG tablet Take 1 tablet (160 mg total) by mouth daily.     No current facility-administered medications for this visit.    REVIEW OF SYSTEMS:   Constitutional: ( - ) fevers, ( - )  chills , ( - ) night sweats Eyes: ( - ) blurriness of vision, ( - ) double vision, ( - ) watery eyes Ears, nose, mouth, throat, and face: ( - ) mucositis, ( - ) sore throat Respiratory: ( - ) cough, ( - ) dyspnea, ( - ) wheezes Cardiovascular: ( - ) palpitation, ( - ) chest discomfort, ( - ) lower extremity swelling Gastrointestinal:  ( - ) nausea, ( - ) heartburn, ( - ) change in bowel habits Skin: ( - ) abnormal skin rashes Lymphatics: ( - ) new lymphadenopathy, ( - ) easy bruising Neurological: ( - ) numbness, ( - ) tingling, ( - ) new weaknesses Behavioral/Psych: ( - ) mood change, ( - ) new changes  All other systems were reviewed with the patient and are negative.  PHYSICAL EXAMINATION:  Vitals:   10/02/23 1309  BP: (!) 160/64  Pulse: 80  Resp: 16  Temp: 97.6 F (36.4 C)  SpO2: 99%   Filed Weights   10/02/23 1309  Weight: 146 lb 4.8 oz (66.4 kg)    GENERAL: well appearing elderly Caucasian female in NAD  SKIN: skin color, texture, turgor are normal, no rashes or significant lesions EYES: conjunctiva are pink and non-injected, sclera clear LUNGS: clear to auscultation and percussion with normal breathing effort HEART: regular rate & rhythm and no murmurs and no lower extremity edema Musculoskeletal: no cyanosis of digits  and no clubbing  PSYCH: alert & oriented x 3, fluent speech NEURO: no focal motor/sensory deficits  LABORATORY DATA:  I have reviewed the data as listed    Latest  Ref Rng & Units 10/02/2023    1:52 PM 04/02/2023   11:50 AM 03/17/2023    4:53 PM  CBC  WBC 4.0 - 10.5 K/uL 5.6  5.6  6.1   Hemoglobin 12.0 - 15.0 g/dL 87.5  89.0  89.1   Hematocrit 36.0 - 46.0 % 35.5  32.7  31.1   Platelets 150 - 400 K/uL 151  147  169        Latest Ref Rng & Units 10/02/2023    1:52 PM 09/11/2023   10:50 AM 04/02/2023   11:50 AM  CMP  Glucose 70 - 99 mg/dL 93  95  91   BUN 8 - 23 mg/dL 20  21  23    Creatinine 0.44 - 1.00 mg/dL 9.11  9.08  9.00   Sodium 135 - 145 mmol/L 138  137  137   Potassium 3.5 - 5.1 mmol/L 4.4  4.4  3.9   Chloride 98 - 111 mmol/L 104  101  102   CO2 22 - 32 mmol/L 28  24  25    Calcium 8.9 - 10.3 mg/dL 9.3  9.1  9.0   Total Protein 6.5 - 8.1 g/dL 6.4   6.6   Total Bilirubin 0.0 - 1.2 mg/dL 1.6   1.4   Alkaline Phos 38 - 126 U/L 106   89   AST 15 - 41 U/L 18   20   ALT 0 - 44 U/L 16   18      ASSESSMENT & PLAN Amy [name] 78 y.o. female with medical history significant for coronary artery disease, hypertension, hyperlipidemia, hypothyroidism, and pacemaker placement who presents for establishing care for evaluation of chronic splenomegaly.  After review of the labs, review of the records, and discussion with the patient the patients findings are most consistent with chronic splenomegaly of unclear etiology, potentially indolent lymphoproliferative disease versus other etiology.  # Splenomegaly, Chronic/Stable  -- Previously followed with Dr. Rogers with last visit on 04/10/2023. -- Patient previously had a PET CT scan on 09/26/2022 which showed no concerning FDG avidity in the spleen, though there was some mild activity noted in lymphadenopathy. -- Patient was offered splenectomy versus observation, patient opted for observation. -- Last CT scan performed on 04/02/2023  showed stable splenomegaly measuring 18 cm.  -- Today we will order baseline labs to include CBC, CMP, LDH, and flow cytometry. -- As long as labs are stable would recommend repeat imaging in February 2026.  Clinic visit to follow.  # Anemia in Setting of IDA and CKD -- will check baseline iron  levels again today   Orders Placed This Encounter  Procedures   CBC with Differential (Cancer Center Only)    Standing Status:   Future    Number of Occurrences:   1    Expiration Date:   10/01/2024   CMP (Cancer Center only)    Standing Status:   Future    Number of Occurrences:   1    Expiration Date:   10/01/2024   Ferritin    Standing Status:   Future    Number of Occurrences:   1    Expiration Date:   10/01/2024   Iron  and Iron  Binding Capacity ([facility] only)    Standing Status:   Future    Number of Occurrences:   1    Expiration Date:   10/01/2024   Retic Panel    Standing Status:   Future    Number of Occurrences:  1    Expiration Date:   10/01/2024   Lactate dehydrogenase (LDH)    Standing Status:   Future    Number of Occurrences:   1    Expiration Date:   10/01/2024   Flow Cytometry, Peripheral Blood (Oncology)    Standing Status:   Future    Number of Occurrences:   1    Expected Date:   10/02/2023    Expiration Date:   10/01/2024    All questions were answered. The patient knows to call the clinic with any problems, questions or concerns.  A total of more than 40 minutes were spent on this encounter with face-to-face time and non-face-to-face time, including preparing to see the patient, ordering tests and/or medications, counseling the patient and coordination of care as outlined above.   Norleen IVAR Kidney, MD Department of Hematology/Oncology [facility] Phone: [phone] Pager: [phone] Email: [email or web address]  10/02/2023 7:51 PM

## 2023-10-09 ENCOUNTER — Other Ambulatory Visit

## 2023-10-09 LAB — FLOW CYTOMETRY

## 2023-10-09 LAB — SURGICAL PATHOLOGY

## 2023-10-10 ENCOUNTER — Ambulatory Visit (INDEPENDENT_AMBULATORY_CARE_PROVIDER_SITE_OTHER): Admitting: Family Medicine

## 2023-10-10 ENCOUNTER — Encounter (INDEPENDENT_AMBULATORY_CARE_PROVIDER_SITE_OTHER): Payer: Self-pay

## 2023-10-10 VITALS — BP 134/71 | Ht 58.75 in | Wt 147.0 lb

## 2023-10-10 DIAGNOSIS — K219 Gastro-esophageal reflux disease without esophagitis: Secondary | ICD-10-CM

## 2023-10-10 DIAGNOSIS — I2510 Atherosclerotic heart disease of native coronary artery without angina pectoris: Secondary | ICD-10-CM | POA: Diagnosis not present

## 2023-10-10 DIAGNOSIS — I701 Atherosclerosis of renal artery: Secondary | ICD-10-CM

## 2023-10-10 DIAGNOSIS — I6523 Occlusion and stenosis of bilateral carotid arteries: Secondary | ICD-10-CM

## 2023-10-10 DIAGNOSIS — H04129 Dry eye syndrome of unspecified lacrimal gland: Secondary | ICD-10-CM | POA: Diagnosis not present

## 2023-10-10 DIAGNOSIS — I495 Sick sinus syndrome: Secondary | ICD-10-CM

## 2023-10-10 DIAGNOSIS — I350 Nonrheumatic aortic (valve) stenosis: Secondary | ICD-10-CM

## 2023-10-10 DIAGNOSIS — I150 Renovascular hypertension: Secondary | ICD-10-CM | POA: Diagnosis not present

## 2023-10-10 MED ORDER — PANTOPRAZOLE SODIUM 40 MG PO TBEC: 40.0000 mg | DELAYED_RELEASE_TABLET | Freq: Two times a day (BID) | ORAL | 1 refills | Status: AC

## 2023-10-10 NOTE — Patient Instructions (Signed)
 Medication as prescribed - Protonix  40 mg twice daily.  Follow up in 3 months.  Take care  Dr. Bluford

## 2023-10-12 DIAGNOSIS — H04129 Dry eye syndrome of unspecified lacrimal gland: Secondary | ICD-10-CM | POA: Insufficient documentation

## 2023-10-12 NOTE — Assessment & Plan Note (Signed)
 Uncontrolled. Increasing Protonix  to 40 mg BID. Spoke with Dr. Eartha regarding potential TIF procedure. Patient will follow up with him regarding this.

## 2023-10-12 NOTE — Progress Notes (Signed)
 Subjective:  Patient ID: Amy [name], female    DOB: December 07, 1945  Age: 78 y.o. MRN: [id number]  CC:   Chief Complaint  Patient presents with   Establish Care    HPI:  78 year old female with a complicated past medical history including coronary artery disease, aortic stenosis, renal artery stenosis, sinus node dysfunction status post pacemaker placement, hypothyroidism, renovascular hypertension, splenomegaly, lichen sclerosis, hyperlipidemia presents to establish care.  Patient is having significant difficulty with her eyes.  She states that she has difficulty opening them.  Denies any weakness of the eyelids that suggest myasthenia gravis.  She states that she has been told that she has dry eyes but the drops have not improved her symptoms.  She would like to see another ophthalmologist.  Patient is also having significant difficulty with GERD.  She feels like her food gets stuck.  She has been seen by GI.  She has had a recent endoscopy.  She is on Protonix  40 mg daily.  She is also been taking antacids.  She has a known hiatal hernia.  Patient follows closely with oncology regarding splenomegaly.  Hypertension is stable.  She follows with cardiology.  There has been no indication for stenting of the renal arteries.  Patient Active Problem List   Diagnosis Date Noted   Dry eye 10/12/2023   Renovascular hypertension 10/10/2023   Sinus node dysfunction (HCC) 10/10/2023   Iron  deficiency anemia 04/10/2023   Renal artery stenosis (HCC) 03/19/2023   Aortic stenosis 01/27/2023   Carotid artery disease (HCC) 12/17/2022   Coronary artery disease 07/17/2022   GERD (gastroesophageal reflux disease) 07/17/2022   Osteopenia 07/17/2022   Hyperlipidemia 07/17/2022   Hypothyroidism 07/17/2022   Lichen sclerosus 07/17/2022   Psoriasis 07/17/2022   Splenomegaly 07/16/2022   Seasonal and perennial allergic rhinitis 06/27/2022    Social Hx   Social History   Socioeconomic History    Marital status: Widowed    Spouse name: Not on file   Number of children: 1   Years of education: Not on file   Highest education level: 12th grade  Occupational History   Not on file  Tobacco Use   Smoking status: Never   Smokeless tobacco: Never  Vaping Use   Vaping status: Never Used  Substance and Sexual Activity   Alcohol  use: No   Drug use: No   Sexual activity: Not Currently    Birth control/protection: None  Other Topics Concern   Not on file  Social History Narrative   Not on file   Social Drivers of Health   Financial Resource Strain: Low Risk  (10/09/2023)   Overall Financial Resource Strain (CARDIA)    Difficulty of Paying Living Expenses: Not hard at all  Food Insecurity: No Food Insecurity (10/09/2023)   Hunger Vital Sign    Worried About Running Out of Food in the Last Year: Never true    Ran Out of Food in the Last Year: Never true  Transportation Needs: No Transportation Needs (10/09/2023)   PRAPARE - Administrator, Civil Service (Medical): No    Lack of Transportation (Non-Medical): No  Physical Activity: Insufficiently Active (10/09/2023)   Exercise Vital Sign    Days of Exercise per Week: 2 days    Minutes of Exercise per Session: 30 min  Stress: No Stress Concern Present (10/09/2023)   [name] of Occupational Health - Occupational Stress Questionnaire    Feeling of Stress: Not at all  Social Connections: Moderately Integrated (  10/09/2023)   Social Connection and Isolation Panel    Frequency of Communication with Friends and Family: More than three times a week    Frequency of Social Gatherings with Friends and Family: More than three times a week    Attends Religious Services: More than 4 times per year    Active Member of [employer] or Organizations: Yes    Attends Banker Meetings: More than 4 times per year    Marital Status: Widowed    Review of Systems Per HPI  Objective:  BP 134/71   Ht 4' 10.75 (1.492 m)   Wt 147  lb (66.7 kg)   BMI 29.94 kg/m      10/10/2023    9:14 AM 10/02/2023    1:09 PM 09/30/2023   11:33 AM  BP/Weight  Systolic BP 134 160 118  Diastolic BP 71 64 46  Wt. (Lbs) 147 146.3   BMI 29.94 kg/m2 29.55 kg/m2     Physical Exam Vitals and nursing note reviewed.  Constitutional:      General: She is not in acute distress.    Appearance: Normal appearance.  HENT:     Head: Normocephalic and atraumatic.     Nose: Nose normal.  Eyes:     Comments: Mild ptosis bilaterally.  Cardiovascular:     Rate and Rhythm: Normal rate and regular rhythm.  Pulmonary:     Effort: Pulmonary effort is normal.     Breath sounds: Normal breath sounds. No wheezing, rhonchi or rales.  Neurological:     Mental Status: She is alert.  Psychiatric:        Mood and Affect: Mood normal.        Behavior: Behavior normal.     Lab Results  Component Value Date   WBC 5.6 10/02/2023   HGB 12.4 10/02/2023   HCT 35.5 (L) 10/02/2023   PLT 151 10/02/2023   GLUCOSE 93 10/02/2023   CHOL 137 03/17/2023   TRIG 58 03/17/2023   HDL 67 03/17/2023   LDLCALC 58 03/17/2023   ALT 16 10/02/2023   AST 18 10/02/2023   NA 138 10/02/2023   K 4.4 10/02/2023   CL 104 10/02/2023   CREATININE 0.88 10/02/2023   BUN 20 10/02/2023   CO2 28 10/02/2023   TSH 1.610 03/17/2023   HGBA1C 5.2 03/17/2023     Assessment & Plan:  Renovascular hypertension Assessment & Plan: BP stable. Continue Norvasc , Hydrochlorothiazide, and Valsartan .   Renal artery stenosis (HCC)  Coronary artery disease involving native coronary artery of native heart without angina pectoris Assessment & Plan: Stable.   Sinus node dysfunction (HCC)  Dry eye Assessment & Plan: Referring to Ophthalmology.   Orders: -     Ambulatory referral to Ophthalmology  Gastroesophageal reflux disease, unspecified whether esophagitis present Assessment & Plan: Uncontrolled. Increasing Protonix  to 40 mg BID. Spoke with Dr. Eartha regarding  potential TIF procedure. Patient will follow up with him regarding this.  Orders: -     Pantoprazole  Sodium; Take 1 tablet (40 mg total) by mouth 2 (two) times daily before a meal.  Dispense: 180 tablet; Refill: 1    Follow-up:  3 months  Airyn Ellzey Bluford DO [facility]

## 2023-10-12 NOTE — Assessment & Plan Note (Signed)
 Stable

## 2023-10-12 NOTE — Assessment & Plan Note (Signed)
Referring to Ophthalmology.

## 2023-10-12 NOTE — Assessment & Plan Note (Signed)
 BP stable. Continue Norvasc , Hydrochlorothiazide, and Valsartan .

## 2023-10-13 ENCOUNTER — Encounter (INDEPENDENT_AMBULATORY_CARE_PROVIDER_SITE_OTHER): Payer: Self-pay | Admitting: *Deleted

## 2023-10-13 ENCOUNTER — Encounter (INDEPENDENT_AMBULATORY_CARE_PROVIDER_SITE_OTHER): Payer: Self-pay | Admitting: Gastroenterology

## 2023-10-13 ENCOUNTER — Ambulatory Visit (INDEPENDENT_AMBULATORY_CARE_PROVIDER_SITE_OTHER): Admitting: Gastroenterology

## 2023-10-13 VITALS — BP 136/70 | HR 86 | Temp 97.8°F | Ht 59.0 in | Wt 148.4 lb

## 2023-10-13 DIAGNOSIS — R112 Nausea with vomiting, unspecified: Secondary | ICD-10-CM

## 2023-10-13 DIAGNOSIS — R197 Diarrhea, unspecified: Secondary | ICD-10-CM | POA: Diagnosis not present

## 2023-10-13 DIAGNOSIS — K219 Gastro-esophageal reflux disease without esophagitis: Secondary | ICD-10-CM

## 2023-10-13 NOTE — Progress Notes (Signed)
 Toribio Fortune, M.D. Gastroenterology & Hepatology [facility] Gastroenterology [address] [place], [place] [address]  Primary Care Physician: Cook, Jayce G, DO [address] [place] [address]  I will communicate my assessment and recommendations to the referring MD via EMR.  Problems: Recurrent nausea with vomiting Chest pain Splenomegaly   History of Present Illness: Amy [name] is a 78 y.o.  female with past medical history of Asthma, CAD, HTN, HLD, Hypothyroidism, sinus node dysfunction with pacemaker in place, GERD, osteopenia, vertigo, lichen sclerosis, psoriasis who presents for evaluation of diarrhea and nausea.  The patient was last seen on 08/21/2023. At that time, the patient was started on Zofran  as needed for nausea and was scheduled for EGD.  EGD performed on 09/30/2023, there was a 2 cm hiatal hernia and some erythema in the gastric antrum (reactive gastropathy, negative for H. pylori), normal duodenum.  Patient reports that she has presented recurrent issues with recurrent issues with nausea with vomiting. She reports that she has presented also diarrhea episodes intermittently. She states that she has had persistent episodes of chest discomfort in her middle of her chest all the time - she feels as a constant pressure. She does not feel any heartburn or dysphagia. Se us  taking pantoprazole  40 mg BID.  States that in the past she was told she may have GERD. She was told in the past that she should avoid chocolate and tomatoes, which helped decrease these episodes. However, even though she has avoided this, she is still having theses episodes more recently. Last episode was a  week ago - vomited twice times (did not take any Zofran ) and had diarrhea.  The patient denies having any fever, chills, hematochezia, melena, hematemesis, abdominal distention, abdominal pain, jaundice, pruritus or weight loss.  She recently saw Dr. Davonna on  10/02/2023, who recommended continuing to monitor with repeat imaging in February 2026.  RUQ US  11/2021 with enlarged spleen, no focal splenic lesions, normal GB, normal Bile ducts, liver with normal texture uniformly, portal vein unremarkable CT Abd with contrast 12/2021 with splenomegaly measuring 18cm in craniocaudal dimension, small hiatal hernia. Subcentimeter segment 4 hypodensity (2:8), too small to characterize.   Last EGD: As above Last Colonoscopy:04/16/18 diverticulosis, internal hemorrhoids   Past Medical History: Past Medical History:  Diagnosis Date   Asthma    CAD (coronary artery disease)    60-70% proximal LAD/diagonal bifurcational disease October 2015 at [facility] status DES x 2 to LAD/diagonal October 2015   Essential hypertension    Heart murmur    Hyperlipidemia    Hypothyroidism    PONV (postoperative nausea and vomiting)    Presence of permanent cardiac pacemaker    Sinus node dysfunction (HCC)    Biotronik pacemaker in place   Spleen enlarged     Past Surgical History: Past Surgical History:  Procedure Laterality Date   ABDOMINAL HYSTERECTOMY     ANAL FISSURE REPAIR     CARDIAC CATHETERIZATION     CATARACT EXTRACTION W/PHACO Right 10/17/2014   Procedure: CATARACT EXTRACTION PHACO AND INTRAOCULAR LENS PLACEMENT (IOC);  Surgeon: Cherene Mania, MD;  Location: [facility];  Service: Ophthalmology;  Laterality: Right;  CDE: 4.88   CATARACT EXTRACTION W/PHACO Left 10/27/2014   Procedure: CATARACT EXTRACTION PHACO AND INTRAOCULAR LENS PLACEMENT (IOC);  Surgeon: Cherene Mania, MD;  Location: [facility];  Service: Ophthalmology;  Laterality: Left;  CDE:7.86   COLONOSCOPY N/A 04/16/2018   Procedure: COLONOSCOPY;  Surgeon: Golda Claudis PENNER, MD;  Location: [facility];  Service: Endoscopy;  Laterality: N/A;  830   ESOPHAGOGASTRODUODENOSCOPY N/A 09/30/2023   Procedure: EGD (ESOPHAGOGASTRODUODENOSCOPY);  Surgeon: Eartha Flavors, Toribio, MD;  Location: [facility];  Service:  Gastroenterology;  Laterality: N/A;  10:15 am, asa 1-2   FINGER SURGERY     INSERT / REPLACE / REMOVE PACEMAKER     NASAL SINUS SURGERY     PACEMAKER IMPLANT N/A 07/30/2016   Procedure: Pacemaker Implant Dual Chamber;  Surgeon: Waddell Danelle ORN, MD;  Location: [facility] INVASIVE CV LAB;  Service: Cardiovascular;  Laterality: N/A;   TONSILLECTOMY      Family History: Family History  Problem Relation Age of Onset   Heart disease Mother    Parkinson's disease Mother    Heart disease Father     Social History: Social History   Tobacco Use  Smoking Status Never  Smokeless Tobacco Never   Social History   Substance and Sexual Activity  Alcohol  Use No   Social History   Substance and Sexual Activity  Drug Use No    Allergies: Allergies  Allergen Reactions   Sulfa Antibiotics Rash    Medications: Current Outpatient Medications  Medication Sig Dispense Refill   amLODipine  (NORVASC ) 10 MG tablet Take 1 tablet (10 mg total) by mouth every evening. 90 tablet 3   aspirin  81 MG tablet Take 81 mg by mouth daily.     atorvastatin (LIPITOR) 20 MG tablet TAKE 1 TABLET BY MOUTH EVERY DAY FOR CHOLESTEROL 90 tablet 3   betamethasone valerate ointment (VALISONE) 0.1 % APPLY TO THE AFFECTED AREA(S) TWICE DAILY (Patient taking differently: Apply topically daily. to affected area(s)) 45 g 2   calcium carbonate (OSCAL) 1500 (600 Ca) MG TABS tablet Take 1,500 mg by mouth daily with breakfast.     Cholecalciferol  (VITAMIN D3) 2000 units capsule Take 2,000 Units by mouth daily at 12 noon.      clobetasol  (TEMOVATE ) 0.05 % external solution Apply 1 application  topically as needed (psoriasis).     clopidogrel  (PLAVIX ) 75 MG tablet TAKE 1 TABLET BY MOUTH DAILY 90 tablet 3   cycloSPORINE (RESTASIS) 0.05 % ophthalmic emulsion 1 drop 2 (two) times daily.     hydrochlorothiazide (HYDRODIURIL) 12.5 MG tablet TAKE 1 TABLET BY MOUTH EVERY DAY HIGH BLOOD PRESSURE 90 tablet 3   isosorbide  mononitrate (IMDUR )  60 MG 24 hr tablet TAKE 1 TABLET BY MOUTH DAILY 30 tablet 2   levothyroxine  (SYNTHROID ) 75 MCG tablet TAKE 1 TABLET BY MOUTH EVERY MORNING 90 tablet 3   magnesium  oxide (MAG-OX) 400 (240 Mg) MG tablet TAKE 1/2 TABLET BY MOUTH DAILY 45 tablet 1   Multiple Vitamin (MULTIVITAMIN WITH MINERALS) TABS tablet Take 1 tablet by mouth daily at 12 noon.      ondansetron  (ZOFRAN -ODT) 4 MG disintegrating tablet Take 1 tablet (4 mg total) by mouth every 8 (eight) hours as needed for nausea or vomiting. 60 tablet 3   pantoprazole  (PROTONIX ) 40 MG tablet Take 1 tablet (40 mg total) by mouth 2 (two) times daily before a meal. 180 tablet 1   triamcinolone ointment (KENALOG) 0.5 % Apply 1 Application topically daily at 6 (six) AM.     valsartan  (DIOVAN ) 160 MG tablet Take 1 tablet (160 mg total) by mouth daily.     No current facility-administered medications for this visit.    Review of Systems: GENERAL: negative for malaise, night sweats HEENT: No changes in hearing or vision, no nose bleeds or other nasal problems.  NECK: Negative for lumps, goiter, pain and significant neck swelling RESPIRATORY: Negative for cough, wheezing CARDIOVASCULAR: Negative for chest pain, leg swelling, palpitations, orthopnea GI: SEE HPI MUSCULOSKELETAL: Negative for joint pain or swelling, back pain, and muscle pain. SKIN: Negative for lesions, rash PSYCH: Negative for sleep disturbance, mood disorder and recent psychosocial stressors. HEMATOLOGY Negative for prolonged bleeding, bruising easily, and swollen nodes. ENDOCRINE: Negative for cold or heat intolerance, polyuria, polydipsia and goiter. NEURO: negative for tremor, gait imbalance, syncope and seizures. The remainder of the review of systems is noncontributory.   Physical Exam: BP 136/70 (BP Location: Right Arm, Patient Position: Sitting, Cuff Size: Normal)   Pulse 86   Temp 97.8 F (36.6 C) (Temporal)   Ht 4' 11 (1.499 m)   Wt 148 lb 6.4 oz (67.3 kg)   BMI 29.97  kg/m  GENERAL: The patient is AO x3, in no acute distress. HEENT: Head is normocephalic and atraumatic. EOMI are intact. Mouth is well hydrated and without lesions. NECK: Supple. No masses LUNGS: Clear to auscultation. No presence of rhonchi/wheezing/rales. Adequate chest expansion HEART: RRR, normal s1 and s2. ABDOMEN: Soft, nontender, no guarding, no peritoneal signs, and nondistended. BS +.  Presence of palpable spleen at least 2 cm below the left costal ridge which is nontender. EXTREMITIES: Without any cyanosis, clubbing, rash, lesions or edema. NEUROLOGIC: AOx3, no focal motor deficit. SKIN: no jaundice, no rashes  Imaging/Labs: as above  I personally reviewed and interpreted the available labs, imaging and endoscopic files.  Impression and Plan: Amy [name] is a 78 y.o.  female with past medical history of Asthma, CAD, HTN, HLD, Hypothyroidism, sinus node dysfunction with pacemaker in place, GERD, osteopenia, vertigo, lichen sclerosis, psoriasis who presents for evaluation of diarrhea and nausea.  Patient has presented recurrent episodes of nausea/vomiting with diarrhea for multiple years, which has been attributed to intake of different food triggers.  Even though she has avoided chocolate and tomato-based foods, she is still presenting these episodes.  Had negative upper endoscopic and cross-sectional abdominal imaging investigations.  She is not presenting with typical symptoms for GERD, although we discussed that given her age she may have an atypical presentation of GERD causing persistent chest discomfort.  Nevertheless, she has presented very poor response to PPI at high dose.  Due to this, she will need to proceed with EGD with Bravo placement on PPI to evaluate this further (patient is not interested in proceeding with pH impedance testing at [facility]).  For now, she will continue pantoprazole  twice daily and Zofran  as needed to relieve her symptoms.  If investigations are still  negative, may consider proceeding with a colonoscopy to obtain random colonic biopsies.  -Schedule EGD with Bravo placement on PPI -Continue pantoprazole  40 mg twice a day,if normal Bravo study will need to decrease to once a day dosing -Continue Zofran  as needed for nausea/vomiting episodes  All questions were answered.      Toribio Fortune, MD Gastroenterology and Hepatology [facility] Gastroenterology

## 2023-10-13 NOTE — Patient Instructions (Addendum)
 Schedule EGD with Bravo placement on PPI Continue pantoprazole  40 mg twice a day,if normal Bravo study will need to decrease to once a day dosing Continue Zofran  as needed for nausea/vomiting episodes

## 2023-10-13 NOTE — Progress Notes (Signed)
 Remote pacemaker transmission.

## 2023-10-13 NOTE — Progress Notes (Signed)
 Patient result letter mailed Patient's PCP is on EPIC

## 2023-10-16 ENCOUNTER — Inpatient Hospital Stay: Admitting: Oncology

## 2023-10-20 ENCOUNTER — Telehealth (INDEPENDENT_AMBULATORY_CARE_PROVIDER_SITE_OTHER): Payer: Self-pay

## 2023-10-20 NOTE — Telephone Encounter (Signed)
 ATC pt to schedule EGD Bravo, no answer. LVM.

## 2023-10-24 ENCOUNTER — Encounter (INDEPENDENT_AMBULATORY_CARE_PROVIDER_SITE_OTHER): Payer: Self-pay | Admitting: Gastroenterology

## 2023-10-25 ENCOUNTER — Telehealth: Payer: Self-pay | Admitting: Hematology and Oncology

## 2023-10-25 ENCOUNTER — Other Ambulatory Visit: Payer: Self-pay | Admitting: Hematology and Oncology

## 2023-10-25 DIAGNOSIS — R161 Splenomegaly, not elsewhere classified: Secondary | ICD-10-CM

## 2023-10-25 NOTE — Progress Notes (Signed)
START ON PATHWAY REGIMEN - Lymphoma and CLL     A cycle is every 7 days:     Rituximab-xxxx   **Always confirm dose/schedule in your pharmacy ordering system**  Patient Characteristics: Marginal Zone Lymphoma, Systemic, First Line, Symptomatic Disease Type: Marginal Zone Lymphoma Disease Type: Not Applicable Disease Type: Not Applicable Localized or Systemic Disease<= Systemic Line of Therapy: First Line Asymptomatic or Symptomatic<= Symptomatic Intent of Therapy: Curative Intent, Discussed with Patient

## 2023-10-25 NOTE — Telephone Encounter (Signed)
 Called number listed for zariana strub and spoke with her daughter.  Findings are most consistent with a splenic marginal zone lymphoma.  Patient would respond well to IV rituximab and therefore discussed treatment options with the patient's daughter.  Discussed the risks and benefits and the expected side effects.  The patient's daughter voiced understanding of our findings and recommendations moving forward.  They are agreeable to starting rituximab therapy.  Will plan to start on 11/11/2023.  Norleen IVAR Kidney, MD Department of Hematology/Oncology [facility] at [facility] Phone: [phone] Pager: [phone] Email: [email or web address]

## 2023-10-26 ENCOUNTER — Other Ambulatory Visit: Payer: Self-pay

## 2023-10-29 ENCOUNTER — Telehealth: Payer: Self-pay | Admitting: Hematology and Oncology

## 2023-10-31 ENCOUNTER — Ambulatory Visit: Payer: Medicare Other

## 2023-10-31 DIAGNOSIS — I495 Sick sinus syndrome: Secondary | ICD-10-CM | POA: Diagnosis not present

## 2023-11-01 ENCOUNTER — Other Ambulatory Visit: Payer: Self-pay | Admitting: Cardiology

## 2023-11-01 LAB — CUP PACEART REMOTE DEVICE CHECK
Date Time Interrogation Session: 20250926103357
Implantable Lead Connection Status: 753985
Implantable Lead Connection Status: 753985
Implantable Lead Implant Date: 20180626
Implantable Lead Implant Date: 20180626
Implantable Lead Location: 753859
Implantable Lead Location: 753860
Implantable Lead Model: 377
Implantable Lead Model: 377
Implantable Lead Serial Number: 49791993
Implantable Lead Serial Number: 49865965
Implantable Pulse Generator Implant Date: 20180626
Pulse Gen Model: 407145
Pulse Gen Serial Number: 69052081

## 2023-11-02 ENCOUNTER — Ambulatory Visit: Payer: Self-pay | Admitting: Internal Medicine

## 2023-11-05 NOTE — Progress Notes (Signed)
 Remote PPM Transmission

## 2023-11-07 ENCOUNTER — Inpatient Hospital Stay: Attending: Hematology and Oncology

## 2023-11-07 ENCOUNTER — Other Ambulatory Visit: Payer: Self-pay | Admitting: Cardiology

## 2023-11-07 DIAGNOSIS — C8307 Small cell B-cell lymphoma, spleen: Secondary | ICD-10-CM | POA: Insufficient documentation

## 2023-11-07 DIAGNOSIS — J45909 Unspecified asthma, uncomplicated: Secondary | ICD-10-CM | POA: Insufficient documentation

## 2023-11-07 DIAGNOSIS — Z79899 Other long term (current) drug therapy: Secondary | ICD-10-CM | POA: Insufficient documentation

## 2023-11-07 DIAGNOSIS — I10 Essential (primary) hypertension: Secondary | ICD-10-CM | POA: Insufficient documentation

## 2023-11-07 DIAGNOSIS — I2510 Atherosclerotic heart disease of native coronary artery without angina pectoris: Secondary | ICD-10-CM | POA: Insufficient documentation

## 2023-11-07 DIAGNOSIS — Z7982 Long term (current) use of aspirin: Secondary | ICD-10-CM | POA: Insufficient documentation

## 2023-11-07 DIAGNOSIS — Z5112 Encounter for antineoplastic immunotherapy: Secondary | ICD-10-CM | POA: Insufficient documentation

## 2023-11-07 DIAGNOSIS — E785 Hyperlipidemia, unspecified: Secondary | ICD-10-CM | POA: Insufficient documentation

## 2023-11-07 DIAGNOSIS — Z7902 Long term (current) use of antithrombotics/antiplatelets: Secondary | ICD-10-CM | POA: Insufficient documentation

## 2023-11-07 DIAGNOSIS — Z79890 Hormone replacement therapy (postmenopausal): Secondary | ICD-10-CM | POA: Insufficient documentation

## 2023-11-07 DIAGNOSIS — E039 Hypothyroidism, unspecified: Secondary | ICD-10-CM | POA: Insufficient documentation

## 2023-11-07 DIAGNOSIS — Z79620 Long term (current) use of immunosuppressive biologic: Secondary | ICD-10-CM | POA: Insufficient documentation

## 2023-11-07 NOTE — Progress Notes (Signed)
 Pharmacist Chemotherapy Monitoring - Initial Assessment    Anticipated start date: 11/14/23   The following has been reviewed per standard work regarding the patient's treatment regimen: The patient's diagnosis, treatment plan and drug doses, and organ/hematologic function Lab orders and baseline tests specific to treatment regimen  The treatment plan start date, drug sequencing, and pre-medications Prior authorization status  Patient's documented medication list, including drug-drug interaction screen and prescriptions for anti-emetics and supportive care specific to the treatment regimen The drug concentrations, fluid compatibility, administration routes, and timing of the medications to be used The patient's access for treatment and lifetime cumulative dose history, if applicable  The patient's medication allergies and previous infusion related reactions, if applicable   Changes made to treatment plan:  N/A  Follow up needed:  F/u hep b labs are drawn   Marlee Eleanor Neighbors, RPH, 11/07/2023  9:37 AM

## 2023-11-10 ENCOUNTER — Telehealth: Payer: Self-pay | Admitting: Cardiology

## 2023-11-10 DIAGNOSIS — L280 Lichen simplex chronicus: Secondary | ICD-10-CM | POA: Diagnosis not present

## 2023-11-10 MED ORDER — VALSARTAN 160 MG PO TABS: 160.0000 mg | ORAL_TABLET | Freq: Every day | ORAL | 1 refills | Status: AC

## 2023-11-10 NOTE — Telephone Encounter (Signed)
 1. Which medications need to be refilled? (please list name of each medication and dose if known)   VALSARTAN    2. Which pharmacy/location (including street and city if local pharmacy) is medication to be sent to?  [facility]  3. Do they need a 30 day or 90 day supply?   30   Pt said when pharmacy called over here it was denied and she needs it for her pill pack.

## 2023-11-10 NOTE — Telephone Encounter (Signed)
 RX sent in

## 2023-11-12 ENCOUNTER — Ambulatory Visit: Attending: Cardiology | Admitting: Cardiology

## 2023-11-12 ENCOUNTER — Encounter: Payer: Self-pay | Admitting: Cardiology

## 2023-11-12 VITALS — BP 130/58 | HR 66 | Ht 59.0 in | Wt 148.2 lb

## 2023-11-12 DIAGNOSIS — I25119 Atherosclerotic heart disease of native coronary artery with unspecified angina pectoris: Secondary | ICD-10-CM | POA: Insufficient documentation

## 2023-11-12 DIAGNOSIS — I5032 Chronic diastolic (congestive) heart failure: Secondary | ICD-10-CM | POA: Insufficient documentation

## 2023-11-12 DIAGNOSIS — Z950 Presence of cardiac pacemaker: Secondary | ICD-10-CM | POA: Insufficient documentation

## 2023-11-12 DIAGNOSIS — I495 Sick sinus syndrome: Secondary | ICD-10-CM | POA: Insufficient documentation

## 2023-11-12 DIAGNOSIS — I701 Atherosclerosis of renal artery: Secondary | ICD-10-CM | POA: Diagnosis not present

## 2023-11-12 DIAGNOSIS — I350 Nonrheumatic aortic (valve) stenosis: Secondary | ICD-10-CM | POA: Insufficient documentation

## 2023-11-12 NOTE — Progress Notes (Signed)
    Cardiology Office Note  Date: 11/12/2023   ID: Amy [name], DOB 01/06/1946, MRN [id number]  History of Present Illness: Amy [name] is a 78 y.o. female that I last saw in February.  She has had more recent evaluation by Dr. Waddell, I reviewed his note from June.  She is here for a routine visit.  Reports no chest pain or palpitations, stable NYHA class II dyspnea.  She continues to follow with oncology for management of splenomegaly and iron  deficiency anemia.  I reviewed her medications.  She reports no change from a cardiac perspective.  Blood pressure is reasonable today on present antihypertensive regimen.  Lipids have also been well-controlled with LDL 58 in February.  Biotronik pacemaker in place with follow-up by Dr. Waddell.  Device check in September revealed normal function.  Physical Exam: VS:  BP (!) 130/58 (BP Location: Left Arm)   Pulse 66   Ht 4' 11 (1.499 m)   Wt 148 lb 3.2 oz (67.2 kg)   SpO2 97%   BMI 29.93 kg/m , BMI Body mass index is 29.93 kg/m.  Wt Readings from Last 3 Encounters:  11/12/23 148 lb 3.2 oz (67.2 kg)  10/13/23 148 lb 6.4 oz (67.3 kg)  10/10/23 147 lb (66.7 kg)    General: Patient appears comfortable at rest. HEENT: Conjunctiva and lids normal. Neck: Supple, no elevated JVP or carotid bruits. Lungs: Clear to auscultation, nonlabored breathing at rest. Cardiac: Regular rate and rhythm, no S3, 2/6 systolic murmur. Extremities: No pitting edema.  ECG:  An ECG dated 12/06/2022 was personally reviewed today and demonstrated:  Sinus rhythm with LVH.  Labwork: 12/27/2022: Magnesium  1.7 03/17/2023: TSH 1.610 10/02/2023: ALT 16; AST 18; BUN 20; Creatinine 0.88; Hemoglobin 12.4; Platelet Count 151; Potassium 4.4; Sodium 138     Component Value Date/Time   CHOL 137 03/17/2023 1653   TRIG 58 03/17/2023 1653   HDL 67 03/17/2023 1653   CHOLHDL 2.0 03/17/2023 1653   LDLCALC 58 03/17/2023 1653   Other Studies Reviewed Today:  No interval  cardiac testing for review today.  Assessment and Plan:  1.  CAD status post DES x 2 to the LAD/diagonal bifurcation in 2015.  PET-CT myocardial perfusion study in January indicated no ischemia.  She does not report any angina on current regimen which includes aspirin  81 mg daily, Lipitor 10 mg daily, and Imdur  60 mg daily.   2.  Severe bilateral renal artery stenosis with ostial calcified atherosclerotic plaque based on CT imaging in November 2024.  She is following with Dr. Court has not required revascularization.  Creatinine 0.88 in August and blood pressure control reasonable.   3.  Mild calcific aortic stenosis by follow-up echocardiogram in January, mean AV gradient 8 mmHg and dimensionless index 0.68.  She is asymptomatic.   4.  Primary hypertension.  Continue Norvasc  10 mg daily, HCTZ 12.5 mg daily and Diovan  160 mg daily.   5.  History of HFpEF, LVEF 60 to 65% with grade 1 diastolic dysfunction and normal RV contraction, mildly elevated estimated RVSP by echocardiogram in January.  She did not tolerate Farxiga  previously.  Continue HCTZ 12.5 mg daily.   6.  Sinus node dysfunction status post Biotronik pacemaker with follow-up by Dr. Waddell.  Device interrogation normal in September.  Disposition:  Follow up 6 months.  Signed, Jayson JUDITHANN Sierras, M.D., F.A.C.C. [facility] at [facility]

## 2023-11-12 NOTE — Patient Instructions (Signed)
 Medication Instructions:  Your physician recommends that you continue on your current medications as directed. Please refer to the Current Medication list given to you today.   Labwork: None today  Testing/Procedures: None today  Follow-Up: 6 months  Any Other Special Instructions Will Be Listed Below (If Applicable).  If you need a refill on your cardiac medications before your next appointment, please call your pharmacy.

## 2023-11-14 ENCOUNTER — Inpatient Hospital Stay

## 2023-11-14 ENCOUNTER — Encounter: Payer: Self-pay | Admitting: Hematology and Oncology

## 2023-11-14 ENCOUNTER — Inpatient Hospital Stay: Admitting: Physician Assistant

## 2023-11-14 ENCOUNTER — Inpatient Hospital Stay: Admitting: Hematology and Oncology

## 2023-11-14 VITALS — BP 145/50 | HR 66 | Resp 18

## 2023-11-14 VITALS — BP 130/44 | HR 61 | Temp 98.1°F | Resp 17 | Wt 145.2 lb

## 2023-11-14 DIAGNOSIS — C8580 Other specified types of non-Hodgkin lymphoma, unspecified site: Secondary | ICD-10-CM

## 2023-11-14 DIAGNOSIS — E039 Hypothyroidism, unspecified: Secondary | ICD-10-CM | POA: Diagnosis not present

## 2023-11-14 DIAGNOSIS — Z79899 Other long term (current) drug therapy: Secondary | ICD-10-CM | POA: Diagnosis not present

## 2023-11-14 DIAGNOSIS — Z7902 Long term (current) use of antithrombotics/antiplatelets: Secondary | ICD-10-CM | POA: Diagnosis not present

## 2023-11-14 DIAGNOSIS — R161 Splenomegaly, not elsewhere classified: Secondary | ICD-10-CM

## 2023-11-14 DIAGNOSIS — D500 Iron deficiency anemia secondary to blood loss (chronic): Secondary | ICD-10-CM | POA: Diagnosis not present

## 2023-11-14 DIAGNOSIS — Z5112 Encounter for antineoplastic immunotherapy: Secondary | ICD-10-CM | POA: Diagnosis not present

## 2023-11-14 DIAGNOSIS — I2510 Atherosclerotic heart disease of native coronary artery without angina pectoris: Secondary | ICD-10-CM | POA: Diagnosis not present

## 2023-11-14 DIAGNOSIS — Z79620 Long term (current) use of immunosuppressive biologic: Secondary | ICD-10-CM | POA: Diagnosis not present

## 2023-11-14 DIAGNOSIS — Z79890 Hormone replacement therapy (postmenopausal): Secondary | ICD-10-CM | POA: Diagnosis not present

## 2023-11-14 DIAGNOSIS — J45909 Unspecified asthma, uncomplicated: Secondary | ICD-10-CM | POA: Diagnosis not present

## 2023-11-14 DIAGNOSIS — C8307 Small cell B-cell lymphoma, spleen: Secondary | ICD-10-CM | POA: Diagnosis not present

## 2023-11-14 DIAGNOSIS — E785 Hyperlipidemia, unspecified: Secondary | ICD-10-CM | POA: Diagnosis not present

## 2023-11-14 DIAGNOSIS — Z7982 Long term (current) use of aspirin: Secondary | ICD-10-CM | POA: Diagnosis not present

## 2023-11-14 DIAGNOSIS — T50905A Adverse effect of unspecified drugs, medicaments and biological substances, initial encounter: Secondary | ICD-10-CM

## 2023-11-14 DIAGNOSIS — I10 Essential (primary) hypertension: Secondary | ICD-10-CM | POA: Diagnosis not present

## 2023-11-14 LAB — CBC WITH DIFFERENTIAL (CANCER CENTER ONLY)
Abs Immature Granulocytes: 0.01 K/uL (ref 0.00–0.07)
Basophils Absolute: 0 K/uL (ref 0.0–0.1)
Basophils Relative: 1 %
Eosinophils Absolute: 0.2 K/uL (ref 0.0–0.5)
Eosinophils Relative: 2 %
HCT: 35.6 % — ABNORMAL LOW (ref 36.0–46.0)
Hemoglobin: 12.8 g/dL (ref 12.0–15.0)
Immature Granulocytes: 0 %
Lymphocytes Relative: 27 %
Lymphs Abs: 1.8 K/uL (ref 0.7–4.0)
MCH: 32 pg (ref 26.0–34.0)
MCHC: 36 g/dL (ref 30.0–36.0)
MCV: 89 fL (ref 80.0–100.0)
Monocytes Absolute: 0.6 K/uL (ref 0.1–1.0)
Monocytes Relative: 9 %
Neutro Abs: 4 K/uL (ref 1.7–7.7)
Neutrophils Relative %: 61 %
Platelet Count: 164 K/uL (ref 150–400)
RBC: 4 MIL/uL (ref 3.87–5.11)
RDW: 12.4 % (ref 11.5–15.5)
WBC Count: 6.5 K/uL (ref 4.0–10.5)
nRBC: 0 % (ref 0.0–0.2)

## 2023-11-14 LAB — CMP (CANCER CENTER ONLY)
ALT: 18 U/L (ref 0–44)
AST: 20 U/L (ref 15–41)
Albumin: 4.3 g/dL (ref 3.5–5.0)
Alkaline Phosphatase: 106 U/L (ref 38–126)
Anion gap: 7 (ref 5–15)
BUN: 20 mg/dL (ref 8–23)
CO2: 27 mmol/L (ref 22–32)
Calcium: 9.7 mg/dL (ref 8.9–10.3)
Chloride: 100 mmol/L (ref 98–111)
Creatinine: 0.93 mg/dL (ref 0.44–1.00)
GFR, Estimated: >60 mL/min (ref >60)
Glucose, Bld: 105 mg/dL — ABNORMAL HIGH (ref 70–99)
Potassium: 4.2 mmol/L (ref 3.5–5.1)
Sodium: 134 mmol/L — ABNORMAL LOW (ref 135–145)
Total Bilirubin: 1.6 mg/dL — ABNORMAL HIGH (ref 0.0–1.2)
Total Protein: 6.7 g/dL (ref 6.5–8.1)

## 2023-11-14 LAB — URIC ACID: Uric Acid, Serum: 4.8 mg/dL (ref 2.5–7.1)

## 2023-11-14 LAB — LACTATE DEHYDROGENASE: LDH: 165 U/L (ref 98–192)

## 2023-11-14 MED ORDER — ACETAMINOPHEN 325 MG PO TABS
650.0000 mg | ORAL_TABLET | Freq: Once | ORAL | Status: AC
  Administered 2023-11-14: 650 mg via ORAL
  Filled 2023-11-14: qty 2

## 2023-11-14 MED ORDER — DIPHENHYDRAMINE HCL 25 MG PO CAPS
50.0000 mg | ORAL_CAPSULE | Freq: Once | ORAL | Status: AC
  Administered 2023-11-14: 50 mg via ORAL
  Filled 2023-11-14: qty 2

## 2023-11-14 MED ORDER — METHYLPREDNISOLONE SODIUM SUCC 125 MG IJ SOLR
125.0000 mg | Freq: Once | INTRAMUSCULAR | Status: AC | PRN
  Administered 2023-11-14: 60 mg via INTRAVENOUS

## 2023-11-14 MED ORDER — SODIUM CHLORIDE 0.9 % IV SOLN
375.0000 mg/m2 | Freq: Once | INTRAVENOUS | Status: AC
  Administered 2023-11-14: 600 mg via INTRAVENOUS
  Filled 2023-11-14: qty 50

## 2023-11-14 MED ORDER — FAMOTIDINE IN NACL 20-0.9 MG/50ML-% IV SOLN
20.0000 mg | Freq: Once | INTRAVENOUS | Status: AC | PRN
  Administered 2023-11-14: 20 mg via INTRAVENOUS

## 2023-11-14 MED ORDER — SODIUM CHLORIDE 0.9 % IV SOLN: INTRAVENOUS | Status: DC

## 2023-11-14 NOTE — Patient Instructions (Signed)
 [facility] - A DEPT OF [facility]  Discharge Instructions: Thank you for choosing  Cancer Center to provide your oncology and hematology care.   If you have a lab appointment with the Cancer Center, please go directly to the Cancer Center and check in at the registration area.   Wear comfortable clothing and clothing appropriate for easy access to any Portacath or PICC line.   We strive to give you quality time with your provider. You may need to reschedule your appointment if you arrive late (15 or more minutes).  Arriving late affects you and other patients whose appointments are after yours.  Also, if you miss three or more appointments without notifying the office, you may be dismissed from the clinic at the provider's discretion.      For prescription refill requests, have your pharmacy contact our office and allow 72 hours for refills to be completed.    Today you received the following chemotherapy and/or immunotherapy agents: riTUXimab -abbs (TRUXIMA )     To help prevent nausea and vomiting after your treatment, we encourage you to take your nausea medication as directed.  BELOW ARE SYMPTOMS THAT SHOULD BE REPORTED IMMEDIATELY: *FEVER GREATER THAN 100.4 F (38 C) OR HIGHER *CHILLS OR SWEATING *NAUSEA AND VOMITING THAT IS NOT CONTROLLED WITH YOUR NAUSEA MEDICATION *UNUSUAL SHORTNESS OF BREATH *UNUSUAL BRUISING OR BLEEDING *URINARY PROBLEMS (pain or burning when urinating, or frequent urination) *BOWEL PROBLEMS (unusual diarrhea, constipation, pain near the anus) TENDERNESS IN MOUTH AND THROAT WITH OR WITHOUT PRESENCE OF ULCERS (sore throat, sores in mouth, or a toothache) UNUSUAL RASH, SWELLING OR PAIN  UNUSUAL VAGINAL DISCHARGE OR ITCHING   Items with * indicate a potential emergency and should be followed up as soon as possible or go to the Emergency Department if any problems should occur.  Please show the CHEMOTHERAPY ALERT CARD or  IMMUNOTHERAPY ALERT CARD at check-in to the Emergency Department and triage nurse.  Should you have questions after your visit or need to cancel or reschedule your appointment, please contact [facility] - A DEPT OF [facility]  Dept: [phone]  and follow the prompts.  Office hours are 8:00 a.m. to 4:30 p.m. Monday - Friday. Please note that voicemails left after 4:00 p.m. may not be returned until the following business day.  We are closed weekends and major holidays. You have access to a nurse at all times for urgent questions. Please call the main number to the clinic Dept: [phone] and follow the prompts.   For any non-urgent questions, you may also contact your provider using MyChart. We now offer e-Visits for anyone 59 and older to request care online for non-urgent symptoms. For details visit [email or web address].   Also download the MyChart app! Go to the app store, search MyChart, open the app, select , and log in with your MyChart username and password.

## 2023-11-14 NOTE — Progress Notes (Signed)
 1357- Patient had c/o sudden pain in her lower back down to her hips that she described as sharp and achy. Pt also stated that she could not stop yawning and that she felt like she needed to take a deep breaths with each yawn. VSS and Carolyn ORN., PA was contacted for symptom management. Famotidine and Solu-medrol  was administer per provider and pt stated that she was feeling better. Tx was restarted at 1417 and remainder of tx was tolerated well. Pt VSS at discharge.

## 2023-11-14 NOTE — Progress Notes (Signed)
    DATE:  11/14/23                                        X CHEMO/IMMUNOTHERAPY REACTION           MD: Federico   AGENT/BLOOD PRODUCT RECEIVING TODAY:              rituximab-abbs   AGENT/BLOOD PRODUCT RECEIVING IMMEDIATELY PRIOR TO REACTION:          rituximab-abbs    Vitals:   11/14/23 1400 11/14/23 1402  BP: (!) 124/96 (!) 145/50  Pulse: 69 66  Resp: 18 18  SpO2: 98% 99%      REACTION(S):     back pain and shortness of breath         PREMEDS:     benadryl 50 mg PO, tylenol  650 mg PO   INTERVENTION: Pepcid 20 mg IV, solu-medrol  40 mg IV   Review of Systems  Review of Systems  Respiratory:  Positive for shortness of breath.   Musculoskeletal:  Positive for back pain.  All other systems reviewed and are negative.    Physical Exam  Physical Exam Vitals and nursing note reviewed.  Constitutional:      Appearance: She is not ill-appearing or toxic-appearing.  HENT:     Head: Normocephalic.  Eyes:     Conjunctiva/sclera: Conjunctivae normal.  Cardiovascular:     Rate and Rhythm: Normal rate and regular rhythm.     Pulses: Normal pulses.     Heart sounds: Normal heart sounds.  Pulmonary:     Effort: Pulmonary effort is normal.     Breath sounds: Normal breath sounds. No wheezing or rales.  Abdominal:     General: There is no distension.  Musculoskeletal:     Cervical back: Normal range of motion.  Skin:    General: Skin is warm and dry.  Neurological:     Mental Status: She is alert.     OUTCOME:                  Patient became symptomatic during first time rituxan infusion near the end of first infusion rate. Emergency medications were administered as documented above. Patient returned to baseline. Oncologist notified and agrees to resume treatment.   I have spent a total of 10 minutes minutes of face-to-face and non-face-to-face time preparing to see the patient, performing a medically appropriate examination, counseling, orderingmedications, documenting  clinical information in the electronic health record, and care coordination.

## 2023-11-14 NOTE — Progress Notes (Signed)
 [facility] [phone]   [phone]  PROGRESS NOTE  Patient Care Team: Cook, Jayce G, DO as PCP - General (Family Medicine) Debera Jayson MATSU, MD as PCP - Cardiology (Cardiology) Center, [facility] as Attending Physician Waddell Danelle ORN, MD as Consulting Physician (Cardiology) Harrie Agent, MD as Consulting Physician (Ophthalmology) Debera Jayson MATSU, MD as Consulting Physician (Cardiology) Court Dorn PARAS, MD as Consulting Physician (Cardiology) Olegario Messier, MD as Referring Physician (Obstetrics and Gynecology) Eartha Flavors, Toribio, MD as Consulting Physician (Gastroenterology)  Hematological/Oncological History # Splenic Marginal Zone Lymphoma 04/10/2023: last visit with Dr. Katragadda at [facility] 10/02/2023: establish care with Dr. Federico. Flow cytometry confirms monoclonal B cell population, consistent with splenic marginal zone lymphoma 11/14/2023: C1D1 of Rituximab monotherapy.   Interval History:  Amy [name] 78 y.o. [name] with medical history significant for newly diagnosed splenic marginal zone lymphoma who presents for a follow up visit. The patient's last visit was on 10/02/2023 at which time she established care. In the interim since the last visit her testing came back positive for monoclonal B-cell population consistent with a splenic marginal zone lymphoma.  She presents today for cycle 1 day 1 of rituximab monotherapy.  On exam today Amy [name] is accompanied by her daughter.  She reports she is been feeling well overall since her last visit.  She denies any fevers, chills, sweats, nausea, vomiting or diarrhea.  She notes that she does have some early satiety and abdominal discomfort.  She is eager to have her spleen decreased in size.  She notes that her energy levels have been good.  She notes that chemotherapy education was informational and she is well-prepared for her immunotherapy treatment.  Otherwise she has had  no questions concerns or complaints and is willing and able to proceed with treatment today.  A full 10 point ROS otherwise negative.  MEDICAL HISTORY:  Past Medical History:  Diagnosis Date   Asthma    CAD (coronary artery disease)    60-70% proximal LAD/diagonal bifurcational disease October 2015 at [facility] status DES x 2 to LAD/diagonal October 2015   Essential hypertension    Heart murmur    Hyperlipidemia    Hypothyroidism    PONV (postoperative nausea and vomiting)    Presence of permanent cardiac pacemaker    Sinus node dysfunction (HCC)    Biotronik pacemaker in place   Spleen enlarged     SURGICAL HISTORY: Past Surgical History:  Procedure Laterality Date   ABDOMINAL HYSTERECTOMY     ANAL FISSURE REPAIR     CARDIAC CATHETERIZATION     CATARACT EXTRACTION W/PHACO Right 10/17/2014   Procedure: CATARACT EXTRACTION PHACO AND INTRAOCULAR LENS PLACEMENT (IOC);  Surgeon: Cherene Mania, MD;  Location: [facility];  Service: Ophthalmology;  Laterality: Right;  CDE: 4.88   CATARACT EXTRACTION W/PHACO Left 10/27/2014   Procedure: CATARACT EXTRACTION PHACO AND INTRAOCULAR LENS PLACEMENT (IOC);  Surgeon: Cherene Mania, MD;  Location: [facility];  Service: Ophthalmology;  Laterality: Left;  CDE:7.86   COLONOSCOPY N/A 04/16/2018   Procedure: COLONOSCOPY;  Surgeon: Golda Claudis PENNER, MD;  Location: [facility];  Service: Endoscopy;  Laterality: N/A;  830   ESOPHAGOGASTRODUODENOSCOPY N/A 09/30/2023   Procedure: EGD (ESOPHAGOGASTRODUODENOSCOPY);  Surgeon: Eartha Flavors, Toribio, MD;  Location: [facility];  Service: Gastroenterology;  Laterality: N/A;  10:15 am, asa 1-2   FINGER SURGERY     INSERT / REPLACE / REMOVE PACEMAKER     NASAL SINUS SURGERY  PACEMAKER IMPLANT N/A 07/30/2016   Procedure: Pacemaker Implant Dual Chamber;  Surgeon: Waddell Danelle ORN, MD;  Location: [facility] INVASIVE CV LAB;  Service: Cardiovascular;  Laterality: N/A;   TONSILLECTOMY      SOCIAL HISTORY: Social History    Socioeconomic History   Marital status: Widowed    Spouse name: Not on file   Number of children: 1   Years of education: Not on file   Highest education level: 12th grade  Occupational History   Not on file  Tobacco Use   Smoking status: Never   Smokeless tobacco: Never  Vaping Use   Vaping status: Never Used  Substance and Sexual Activity   Alcohol  use: No   Drug use: No   Sexual activity: Not Currently    Birth control/protection: None  Other Topics Concern   Not on file  Social History Narrative   Not on file   Social Drivers of Health   Financial Resource Strain: Low Risk  (10/09/2023)   Overall Financial Resource Strain (CARDIA)    Difficulty of Paying Living Expenses: Not hard at all  Food Insecurity: No Food Insecurity (10/09/2023)   Hunger Vital Sign    Worried About Running Out of Food in the Last Year: Never true    Ran Out of Food in the Last Year: Never true  Transportation Needs: No Transportation Needs (10/09/2023)   PRAPARE - Administrator, Civil Service (Medical): No    Lack of Transportation (Non-Medical): No  Physical Activity: Insufficiently Active (10/09/2023)   Exercise Vital Sign    Days of Exercise per Week: 2 days    Minutes of Exercise per Session: 30 min  Stress: No Stress Concern Present (10/09/2023)   [name] of Occupational Health - Occupational Stress Questionnaire    Feeling of Stress: Not at all  Social Connections: Moderately Integrated (10/09/2023)   Social Connection and Isolation Panel    Frequency of Communication with Friends and Family: More than three times a week    Frequency of Social Gatherings with Friends and Family: More than three times a week    Attends Religious Services: More than 4 times per year    Active Member of [employer] or Organizations: Yes    Attends Banker Meetings: More than 4 times per year    Marital Status: Widowed  Intimate Partner Violence: Not At Risk (10/02/2023)    Humiliation, Afraid, Rape, and Kick questionnaire    Fear of Current or Ex-Partner: No    Emotionally Abused: No    Physically Abused: No    Sexually Abused: No    FAMILY HISTORY: Family History  Problem Relation Age of Onset   Heart disease Mother    Parkinson's disease Mother    Heart disease Father     ALLERGIES:  is allergic to sulfa antibiotics.  MEDICATIONS:  Current Outpatient Medications  Medication Sig Dispense Refill   amLODipine  (NORVASC ) 10 MG tablet Take 1 tablet (10 mg total) by mouth every evening. 90 tablet 3   aspirin  81 MG tablet Take 81 mg by mouth daily.     atorvastatin (LIPITOR) 20 MG tablet TAKE 1 TABLET BY MOUTH EVERY DAY FOR CHOLESTEROL 90 tablet 3   betamethasone valerate ointment (VALISONE) 0.1 % APPLY TO THE AFFECTED AREA(S) TWICE DAILY (Patient taking differently: Apply topically daily. to affected area(s)) 45 g 2   calcium carbonate (OSCAL) 1500 (600 Ca) MG TABS tablet Take 1,500 mg by mouth daily with breakfast.  Cholecalciferol  (VITAMIN D3) 2000 units capsule Take 2,000 Units by mouth daily at 12 noon.      clobetasol  (TEMOVATE ) 0.05 % external solution Apply 1 application  topically as needed (psoriasis).     clopidogrel  (PLAVIX ) 75 MG tablet TAKE 1 TABLET BY MOUTH DAILY 90 tablet 3   cycloSPORINE (RESTASIS) 0.05 % ophthalmic emulsion 1 drop 2 (two) times daily.     hydrochlorothiazide (HYDRODIURIL) 12.5 MG tablet TAKE 1 TABLET BY MOUTH EVERY DAY HIGH BLOOD PRESSURE 90 tablet 3   isosorbide  mononitrate (IMDUR ) 60 MG 24 hr tablet TAKE 1 TABLET BY MOUTH DAILY 30 tablet 2   levothyroxine  (SYNTHROID ) 75 MCG tablet TAKE 1 TABLET BY MOUTH EVERY MORNING 90 tablet 3   magnesium  oxide (MAG-OX) 400 (240 Mg) MG tablet TAKE 1/2 TABLET BY MOUTH DAILY 45 tablet 1   Multiple Vitamin (MULTIVITAMIN WITH MINERALS) TABS tablet Take 1 tablet by mouth daily at 12 noon.      nystatin cream (MYCOSTATIN) Apply 1 Application topically 2 (two) times daily.     ondansetron   (ZOFRAN -ODT) 4 MG disintegrating tablet Take 1 tablet (4 mg total) by mouth every 8 (eight) hours as needed for nausea or vomiting. 60 tablet 3   pantoprazole  (PROTONIX ) 40 MG tablet Take 1 tablet (40 mg total) by mouth 2 (two) times daily before a meal. 180 tablet 1   triamcinolone ointment (KENALOG) 0.5 % Apply 1 Application topically daily at 6 (six) AM.     valsartan  (DIOVAN ) 160 MG tablet Take 1 tablet (160 mg total) by mouth daily. 90 tablet 1   No current facility-administered medications for this visit.    REVIEW OF SYSTEMS:   Constitutional: ( - ) fevers, ( - )  chills , ( - ) night sweats Eyes: ( - ) blurriness of vision, ( - ) double vision, ( - ) watery eyes Ears, nose, mouth, throat, and face: ( - ) mucositis, ( - ) sore throat Respiratory: ( - ) cough, ( - ) dyspnea, ( - ) wheezes Cardiovascular: ( - ) palpitation, ( - ) chest discomfort, ( - ) lower extremity swelling Gastrointestinal:  ( - ) nausea, ( - ) heartburn, ( - ) change in bowel habits Skin: ( - ) abnormal skin rashes Lymphatics: ( - ) new lymphadenopathy, ( - ) easy bruising Neurological: ( - ) numbness, ( - ) tingling, ( - ) new weaknesses Behavioral/Psych: ( - ) mood change, ( - ) new changes  All other systems were reviewed with the patient and are negative.  PHYSICAL EXAMINATION: ECOG PERFORMANCE STATUS: 1 - Symptomatic but completely ambulatory  There were no vitals filed for this visit. There were no vitals filed for this visit.  GENERAL: Well-appearing elderly Caucasian [name], alert, no distress and comfortable SKIN: skin color, texture, turgor are normal, no rashes or significant lesions EYES: conjunctiva are pink and non-injected, sclera clear LUNGS: clear to auscultation and percussion with normal breathing effort HEART: regular rate & rhythm and no murmurs and no lower extremity edema Musculoskeletal: no cyanosis of digits and no clubbing  PSYCH: alert & oriented x 3, fluent speech NEURO: no  focal motor/sensory deficits  LABORATORY DATA:  I have reviewed the data as listed    Latest Ref Rng & Units 11/14/2023   11:18 AM 10/02/2023    1:52 PM 04/02/2023   11:50 AM  CBC  WBC 4.0 - 10.5 K/uL 6.5  5.6  5.6   Hemoglobin 12.0 - 15.0 g/dL 12.8  12.4  10.9   Hematocrit 36.0 - 46.0 % 35.6  35.5  32.7   Platelets 150 - 400 K/uL 164  151  147        Latest Ref Rng & Units 11/14/2023   11:18 AM 10/02/2023    1:52 PM 09/11/2023   10:50 AM  CMP  Glucose 70 - 99 mg/dL 894  93  95   BUN 8 - 23 mg/dL 20  20  21    Creatinine 0.44 - 1.00 mg/dL 9.06  9.11  9.08   Sodium 135 - 145 mmol/L 134  138  137   Potassium 3.5 - 5.1 mmol/L 4.2  4.4  4.4   Chloride 98 - 111 mmol/L 100  104  101   CO2 22 - 32 mmol/L 27  28  24    Calcium 8.9 - 10.3 mg/dL 9.7  9.3  9.1   Total Protein 6.5 - 8.1 g/dL 6.7  6.4    Total Bilirubin 0.0 - 1.2 mg/dL 1.6  1.6    Alkaline Phos 38 - 126 U/L 106  106    AST 15 - 41 U/L 20  18    ALT 0 - 44 U/L 18  16      RADIOGRAPHIC STUDIES: CUP PACEART REMOTE DEVICE CHECK Result Date: 11/01/2023 PPM Scheduled remote reviewed. Normal device function.  Presenting rhythm: AP-VS, PACs.  3 VHR detections, EGMs consistent with atrial driven tachycardia. Next remote 91 days. - CS, CVRS   ASSESSMENT & PLAN Amy [name] 78 y.o. [name] with medical history significant for newly diagnosed splenic marginal zone lymphoma who presents for a follow up visit.  # Splenic Marginal Zone Lymphoma-symptomatic  -- Patient has history of a splenomegaly which has been negative on PET CT scan.  Last imaging of the abdomen on 04/02/2023 showed worsening splenomegaly. -- Flow cytometry showed evidence of monoclonal B-cell population, findings most consistent with a splenic marginal zone lymphoma. --Patient meets the criteria for treatment based on symptomatic splenomegaly. -- Will start rituximab monotherapy with rituximab x 4 weeks weekly, followed by q. 8 weeks for 2 years. -- Labs today show  white blood cell 6.5, hemoglobin 12.8, MCV 89, platelets 164.  LFTs and creatinine within normal limits -- Return to clinic weekly for rituximab x 4 weeks.  #Supportive Care -- chemotherapy education complete -- port placed -- allopurinol 300mg  PO daily for TLS prophylaxis -- no pain medication required at this time.    No orders of the defined types were placed in this encounter.   All questions were answered. The patient knows to call the clinic with any problems, questions or concerns.  A total of more than 30 minutes were spent on this encounter with face-to-face time and non-face-to-face time, including preparing to see the patient, ordering tests and/or medications, counseling the patient and coordination of care as outlined above.   Norleen IVAR Kidney, MD Department of Hematology/Oncology [facility] Phone: [phone] Pager: [phone] Email: [email or web address]  11/18/2023 1:47 PM

## 2023-11-16 ENCOUNTER — Encounter: Payer: Self-pay | Admitting: Oncology

## 2023-11-17 ENCOUNTER — Ambulatory Visit

## 2023-11-18 ENCOUNTER — Encounter: Payer: Self-pay | Admitting: Oncology

## 2023-11-18 DIAGNOSIS — C8580 Other specified types of non-Hodgkin lymphoma, unspecified site: Secondary | ICD-10-CM | POA: Insufficient documentation

## 2023-11-19 ENCOUNTER — Encounter (INDEPENDENT_AMBULATORY_CARE_PROVIDER_SITE_OTHER): Payer: Self-pay | Admitting: Gastroenterology

## 2023-11-19 LAB — HEPATITIS B CORE ANTIBODY, TOTAL: HEP B CORE AB: NEGATIVE

## 2023-11-20 LAB — HEPATITIS B SURFACE ANTIBODY,QUALITATIVE

## 2023-11-20 LAB — HEPATITIS B SURFACE ANTIGEN

## 2023-11-20 NOTE — Progress Notes (Signed)
 Solu-medrol  40mg  and Pepcid IV 20mg  added as premeds for subsequent cycles d/t back pain and SOB C1D1 rituximab per Dr. Federico.  Lasean Rahming, PharmD, MBA

## 2023-11-21 ENCOUNTER — Inpatient Hospital Stay

## 2023-11-21 ENCOUNTER — Ambulatory Visit

## 2023-11-21 VITALS — BP 139/39 | HR 65 | Temp 98.1°F | Resp 16 | Wt 144.5 lb

## 2023-11-21 DIAGNOSIS — Z5112 Encounter for antineoplastic immunotherapy: Secondary | ICD-10-CM | POA: Diagnosis not present

## 2023-11-21 DIAGNOSIS — C8307 Small cell B-cell lymphoma, spleen: Secondary | ICD-10-CM | POA: Diagnosis not present

## 2023-11-21 DIAGNOSIS — R161 Splenomegaly, not elsewhere classified: Secondary | ICD-10-CM

## 2023-11-21 DIAGNOSIS — I10 Essential (primary) hypertension: Secondary | ICD-10-CM | POA: Diagnosis not present

## 2023-11-21 DIAGNOSIS — E039 Hypothyroidism, unspecified: Secondary | ICD-10-CM | POA: Diagnosis not present

## 2023-11-21 DIAGNOSIS — I2510 Atherosclerotic heart disease of native coronary artery without angina pectoris: Secondary | ICD-10-CM | POA: Diagnosis not present

## 2023-11-21 DIAGNOSIS — E785 Hyperlipidemia, unspecified: Secondary | ICD-10-CM | POA: Diagnosis not present

## 2023-11-21 LAB — CBC WITH DIFFERENTIAL (CANCER CENTER ONLY)
Abs Immature Granulocytes: 0.05 K/uL (ref 0.00–0.07)
Basophils Absolute: 0.1 K/uL (ref 0.0–0.1)
Basophils Relative: 1 %
Eosinophils Absolute: 0.3 K/uL (ref 0.0–0.5)
Eosinophils Relative: 2 %
HCT: 39.8 % (ref 36.0–46.0)
Hemoglobin: 14.2 g/dL (ref 12.0–15.0)
Immature Granulocytes: 0 %
Lymphocytes Relative: 53 %
Lymphs Abs: 10.4 K/uL — ABNORMAL HIGH (ref 0.7–4.0)
MCH: 31.3 pg (ref 26.0–34.0)
MCHC: 35.7 g/dL (ref 30.0–36.0)
MCV: 87.7 fL (ref 80.0–100.0)
Monocytes Absolute: 1.1 K/uL — ABNORMAL HIGH (ref 0.1–1.0)
Monocytes Relative: 6 %
Neutro Abs: 7.2 K/uL (ref 1.7–7.7)
Neutrophils Relative %: 38 %
Platelet Count: 154 K/uL (ref 150–400)
RBC: 4.54 MIL/uL (ref 3.87–5.11)
RDW: 12.5 % (ref 11.5–15.5)
Smear Review: NORMAL
WBC Count: 19.2 K/uL — ABNORMAL HIGH (ref 4.0–10.5)
nRBC: 0 % (ref 0.0–0.2)

## 2023-11-21 LAB — URIC ACID: Uric Acid, Serum: 4.3 mg/dL (ref 2.5–7.1)

## 2023-11-21 LAB — CMP (CANCER CENTER ONLY)
ALT: 30 U/L (ref 0–44)
AST: 24 U/L (ref 15–41)
Albumin: 4.2 g/dL (ref 3.5–5.0)
Alkaline Phosphatase: 119 U/L (ref 38–126)
Anion gap: 5 (ref 5–15)
BUN: 24 mg/dL — ABNORMAL HIGH (ref 8–23)
CO2: 29 mmol/L (ref 22–32)
Calcium: 9.7 mg/dL (ref 8.9–10.3)
Chloride: 94 mmol/L — ABNORMAL LOW (ref 98–111)
Creatinine: 1 mg/dL (ref 0.44–1.00)
GFR, Estimated: 57 mL/min — ABNORMAL LOW (ref >60)
Glucose, Bld: 83 mg/dL (ref 70–99)
Potassium: 4.7 mmol/L (ref 3.5–5.1)
Sodium: 128 mmol/L — ABNORMAL LOW (ref 135–145)
Total Bilirubin: 1.2 mg/dL (ref 0.0–1.2)
Total Protein: 6.6 g/dL (ref 6.5–8.1)

## 2023-11-21 LAB — LACTATE DEHYDROGENASE: LDH: 151 U/L (ref 98–192)

## 2023-11-21 MED ORDER — ACETAMINOPHEN 325 MG PO TABS
650.0000 mg | ORAL_TABLET | Freq: Once | ORAL | Status: AC
  Administered 2023-11-21: 650 mg via ORAL
  Filled 2023-11-21: qty 2

## 2023-11-21 MED ORDER — SODIUM CHLORIDE 0.9 % IV SOLN: INTRAVENOUS | Status: DC

## 2023-11-21 MED ORDER — DIPHENHYDRAMINE HCL 25 MG PO CAPS
50.0000 mg | ORAL_CAPSULE | Freq: Once | ORAL | Status: AC
  Administered 2023-11-21: 50 mg via ORAL
  Filled 2023-11-21: qty 2

## 2023-11-21 MED ORDER — METHYLPREDNISOLONE SODIUM SUCC 40 MG IJ SOLR
40.0000 mg | Freq: Once | INTRAMUSCULAR | Status: AC
  Administered 2023-11-21: 40 mg via INTRAVENOUS
  Filled 2023-11-21: qty 1

## 2023-11-21 MED ORDER — FAMOTIDINE IN NACL 20-0.9 MG/50ML-% IV SOLN
20.0000 mg | Freq: Once | INTRAVENOUS | Status: AC
  Administered 2023-11-21: 20 mg via INTRAVENOUS
  Filled 2023-11-21: qty 50

## 2023-11-21 MED ORDER — SODIUM CHLORIDE 0.9 % IV SOLN
375.0000 mg/m2 | Freq: Once | INTRAVENOUS | Status: AC
  Administered 2023-11-21: 600 mg via INTRAVENOUS
  Filled 2023-11-21: qty 50

## 2023-11-21 NOTE — Patient Instructions (Signed)
 [facility] - A DEPT OF [facility]  Discharge Instructions: Thank you for choosing  Cancer Center to provide your oncology and hematology care.   If you have a lab appointment with the Cancer Center, please go directly to the Cancer Center and check in at the registration area.   Wear comfortable clothing and clothing appropriate for easy access to any Portacath or PICC line.   We strive to give you quality time with your provider. You may need to reschedule your appointment if you arrive late (15 or more minutes).  Arriving late affects you and other patients whose appointments are after yours.  Also, if you miss three or more appointments without notifying the office, you may be dismissed from the clinic at the provider's discretion.      For prescription refill requests, have your pharmacy contact our office and allow 72 hours for refills to be completed.    Today you received the following chemotherapy and/or immunotherapy agents: riTUXimab -abbs (TRUXIMA )     To help prevent nausea and vomiting after your treatment, we encourage you to take your nausea medication as directed.  BELOW ARE SYMPTOMS THAT SHOULD BE REPORTED IMMEDIATELY: *FEVER GREATER THAN 100.4 F (38 C) OR HIGHER *CHILLS OR SWEATING *NAUSEA AND VOMITING THAT IS NOT CONTROLLED WITH YOUR NAUSEA MEDICATION *UNUSUAL SHORTNESS OF BREATH *UNUSUAL BRUISING OR BLEEDING *URINARY PROBLEMS (pain or burning when urinating, or frequent urination) *BOWEL PROBLEMS (unusual diarrhea, constipation, pain near the anus) TENDERNESS IN MOUTH AND THROAT WITH OR WITHOUT PRESENCE OF ULCERS (sore throat, sores in mouth, or a toothache) UNUSUAL RASH, SWELLING OR PAIN  UNUSUAL VAGINAL DISCHARGE OR ITCHING   Items with * indicate a potential emergency and should be followed up as soon as possible or go to the Emergency Department if any problems should occur.  Please show the CHEMOTHERAPY ALERT CARD or  IMMUNOTHERAPY ALERT CARD at check-in to the Emergency Department and triage nurse.  Should you have questions after your visit or need to cancel or reschedule your appointment, please contact [facility] - A DEPT OF [facility]  Dept: [phone]  and follow the prompts.  Office hours are 8:00 a.m. to 4:30 p.m. Monday - Friday. Please note that voicemails left after 4:00 p.m. may not be returned until the following business day.  We are closed weekends and major holidays. You have access to a nurse at all times for urgent questions. Please call the main number to the clinic Dept: [phone] and follow the prompts.   For any non-urgent questions, you may also contact your provider using MyChart. We now offer e-Visits for anyone 59 and older to request care online for non-urgent symptoms. For details visit [email or web address].   Also download the MyChart app! Go to the app store, search MyChart, open the app, select , and log in with your MyChart username and password.

## 2023-11-27 ENCOUNTER — Other Ambulatory Visit: Payer: Self-pay | Admitting: Cardiology

## 2023-11-27 ENCOUNTER — Other Ambulatory Visit: Payer: Self-pay | Admitting: Cardiovascular Disease

## 2023-11-27 NOTE — Progress Notes (Unsigned)
 [facility] [phone]   [phone]  PROGRESS NOTE  Patient Care Team: Cook, Jayce G, DO as PCP - General (Family Medicine) Debera Jayson MATSU, MD as PCP - Cardiology (Cardiology) Center, [facility] as Attending Physician Waddell Danelle ORN, MD as Consulting Physician (Cardiology) Harrie Agent, MD as Consulting Physician (Ophthalmology) Debera Jayson MATSU, MD as Consulting Physician (Cardiology) Court Dorn PARAS, MD as Consulting Physician (Cardiology) Olegario Messier, MD as Referring Physician (Obstetrics and Gynecology) Eartha Flavors, Toribio, MD as Consulting Physician (Gastroenterology)  Hematological/Oncological History # Splenic Marginal Zone Lymphoma 04/10/2023: last visit with Dr. Katragadda at [facility] 10/02/2023: establish care with Dr. Federico. Flow cytometry confirms monoclonal B cell population, consistent with splenic marginal zone lymphoma 11/14/2023: C1D1 of Rituximab monotherapy.   Interval History:  Amy [name] 78 y.o. female with medical history significant for newly diagnosed splenic marginal zone lymphoma who presents for a follow up visit.  She presents today for cycle 3 day 1 of rituximab monotherapy.  On exam today Amy [name]***  A full 10 point ROS otherwise negative.  MEDICAL HISTORY:  Past Medical History:  Diagnosis Date   Asthma    CAD (coronary artery disease)    60-70% proximal LAD/diagonal bifurcational disease October 2015 at [facility] status DES x 2 to LAD/diagonal October 2015   Essential hypertension    Heart murmur    Hyperlipidemia    Hypothyroidism    PONV (postoperative nausea and vomiting)    Presence of permanent cardiac pacemaker    Sinus node dysfunction (HCC)    Biotronik pacemaker in place   Spleen enlarged     SURGICAL HISTORY: Past Surgical History:  Procedure Laterality Date   ABDOMINAL HYSTERECTOMY     ANAL FISSURE REPAIR     CARDIAC CATHETERIZATION     CATARACT EXTRACTION  W/PHACO Right 10/17/2014   Procedure: CATARACT EXTRACTION PHACO AND INTRAOCULAR LENS PLACEMENT (IOC);  Surgeon: Cherene Mania, MD;  Location: [facility];  Service: Ophthalmology;  Laterality: Right;  CDE: 4.88   CATARACT EXTRACTION W/PHACO Left 10/27/2014   Procedure: CATARACT EXTRACTION PHACO AND INTRAOCULAR LENS PLACEMENT (IOC);  Surgeon: Cherene Mania, MD;  Location: [facility];  Service: Ophthalmology;  Laterality: Left;  CDE:7.86   COLONOSCOPY N/A 04/16/2018   Procedure: COLONOSCOPY;  Surgeon: Golda Claudis PENNER, MD;  Location: [facility];  Service: Endoscopy;  Laterality: N/A;  830   ESOPHAGOGASTRODUODENOSCOPY N/A 09/30/2023   Procedure: EGD (ESOPHAGOGASTRODUODENOSCOPY);  Surgeon: Eartha Flavors, Toribio, MD;  Location: [facility];  Service: Gastroenterology;  Laterality: N/A;  10:15 am, asa 1-2   FINGER SURGERY     INSERT / REPLACE / REMOVE PACEMAKER     NASAL SINUS SURGERY     PACEMAKER IMPLANT N/A 07/30/2016   Procedure: Pacemaker Implant Dual Chamber;  Surgeon: Waddell Danelle ORN, MD;  Location: [facility] INVASIVE CV LAB;  Service: Cardiovascular;  Laterality: N/A;   TONSILLECTOMY      SOCIAL HISTORY: Social History   Socioeconomic History   Marital status: Widowed    Spouse name: Not on file   Number of children: 1   Years of education: Not on file   Highest education level: 12th grade  Occupational History   Not on file  Tobacco Use   Smoking status: Never   Smokeless tobacco: Never  Vaping Use   Vaping status: Never Used  Substance and Sexual Activity   Alcohol  use: No   Drug use: No   Sexual activity: Not Currently    Birth  control/protection: None  Other Topics Concern   Not on file  Social History Narrative   Not on file   Social Drivers of Health   Financial Resource Strain: Low Risk  (10/09/2023)   Overall Financial Resource Strain (CARDIA)    Difficulty of Paying Living Expenses: Not hard at all  Food Insecurity: No Food Insecurity (10/09/2023)   Hunger Vital Sign     Worried About Running Out of Food in the Last Year: Never true    Ran Out of Food in the Last Year: Never true  Transportation Needs: No Transportation Needs (10/09/2023)   PRAPARE - Administrator, Civil Service (Medical): No    Lack of Transportation (Non-Medical): No  Physical Activity: Insufficiently Active (10/09/2023)   Exercise Vital Sign    Days of Exercise per Week: 2 days    Minutes of Exercise per Session: 30 min  Stress: No Stress Concern Present (10/09/2023)   [name] of Occupational Health - Occupational Stress Questionnaire    Feeling of Stress: Not at all  Social Connections: Moderately Integrated (10/09/2023)   Social Connection and Isolation Panel    Frequency of Communication with Friends and Family: More than three times a week    Frequency of Social Gatherings with Friends and Family: More than three times a week    Attends Religious Services: More than 4 times per year    Active Member of [employer] or Organizations: Yes    Attends Banker Meetings: More than 4 times per year    Marital Status: Widowed  Intimate Partner Violence: Not At Risk (10/02/2023)   Humiliation, Afraid, Rape, and Kick questionnaire    Fear of Current or Ex-Partner: No    Emotionally Abused: No    Physically Abused: No    Sexually Abused: No    FAMILY HISTORY: Family History  Problem Relation Age of Onset   Heart disease Mother    Parkinson's disease Mother    Heart disease Father     ALLERGIES:  is allergic to truxima [rituximab] and sulfa antibiotics.  MEDICATIONS:  Current Outpatient Medications  Medication Sig Dispense Refill   amLODipine  (NORVASC ) 10 MG tablet Take 1 tablet (10 mg total) by mouth every evening. 90 tablet 3   aspirin  81 MG tablet Take 81 mg by mouth daily.     atorvastatin (LIPITOR) 20 MG tablet TAKE 1 TABLET BY MOUTH EVERY DAY FOR CHOLESTEROL 90 tablet 3   betamethasone valerate ointment (VALISONE) 0.1 % APPLY TO THE AFFECTED AREA(S)  TWICE DAILY (Patient taking differently: Apply topically daily. to affected area(s)) 45 g 2   calcium carbonate (OSCAL) 1500 (600 Ca) MG TABS tablet Take 1,500 mg by mouth daily with breakfast.     Cholecalciferol  (VITAMIN D3) 2000 units capsule Take 2,000 Units by mouth daily at 12 noon.      clobetasol  (TEMOVATE ) 0.05 % external solution Apply 1 application  topically as needed (psoriasis).     clopidogrel  (PLAVIX ) 75 MG tablet TAKE 1 TABLET BY MOUTH DAILY 90 tablet 3   cycloSPORINE (RESTASIS) 0.05 % ophthalmic emulsion 1 drop 2 (two) times daily.     hydrochlorothiazide (HYDRODIURIL) 12.5 MG tablet TAKE 1 TABLET BY MOUTH EVERY DAY HIGH BLOOD PRESSURE 90 tablet 3   isosorbide  mononitrate (IMDUR ) 60 MG 24 hr tablet TAKE 1 TABLET BY MOUTH DAILY 30 tablet 2   levothyroxine  (SYNTHROID ) 75 MCG tablet TAKE 1 TABLET BY MOUTH EVERY MORNING 90 tablet 3   magnesium  oxide (MAG-OX)  400 (240 Mg) MG tablet TAKE 1/2 TABLET BY MOUTH DAILY 45 tablet 1   Multiple Vitamin (MULTIVITAMIN WITH MINERALS) TABS tablet Take 1 tablet by mouth daily at 12 noon.      nystatin cream (MYCOSTATIN) Apply 1 Application topically 2 (two) times daily.     ondansetron  (ZOFRAN -ODT) 4 MG disintegrating tablet Take 1 tablet (4 mg total) by mouth every 8 (eight) hours as needed for nausea or vomiting. 60 tablet 3   pantoprazole  (PROTONIX ) 40 MG tablet Take 1 tablet (40 mg total) by mouth 2 (two) times daily before a meal. 180 tablet 1   triamcinolone ointment (KENALOG) 0.5 % Apply 1 Application topically daily at 6 (six) AM.     valsartan  (DIOVAN ) 160 MG tablet Take 1 tablet (160 mg total) by mouth daily. 90 tablet 1   No current facility-administered medications for this visit.    REVIEW OF SYSTEMS:   Constitutional: ( - ) fevers, ( - )  chills , ( - ) night sweats Eyes: ( - ) blurriness of vision, ( - ) double vision, ( - ) watery eyes Ears, nose, mouth, throat, and face: ( - ) mucositis, ( - ) sore throat Respiratory: ( - )  cough, ( - ) dyspnea, ( - ) wheezes Cardiovascular: ( - ) palpitation, ( - ) chest discomfort, ( - ) lower extremity swelling Gastrointestinal:  ( - ) nausea, ( - ) heartburn, ( - ) change in bowel habits Skin: ( - ) abnormal skin rashes Lymphatics: ( - ) new lymphadenopathy, ( - ) easy bruising Neurological: ( - ) numbness, ( - ) tingling, ( - ) new weaknesses Behavioral/Psych: ( - ) mood change, ( - ) new changes  All other systems were reviewed with the patient and are negative.  PHYSICAL EXAMINATION: ECOG PERFORMANCE STATUS: 1 - Symptomatic but completely ambulatory  There were no vitals filed for this visit. There were no vitals filed for this visit.  GENERAL: Well-appearing elderly Caucasian female, alert, no distress and comfortable SKIN: skin color, texture, turgor are normal, no rashes or significant lesions EYES: conjunctiva are pink and non-injected, sclera clear LUNGS: clear to auscultation and percussion with normal breathing effort HEART: regular rate & rhythm and no murmurs and no lower extremity edema Musculoskeletal: no cyanosis of digits and no clubbing  PSYCH: alert & oriented x 3, fluent speech NEURO: no focal motor/sensory deficits  LABORATORY DATA:  I have reviewed the data as listed    Latest Ref Rng & Units 11/21/2023   10:19 AM 11/14/2023   11:18 AM 10/02/2023    1:52 PM  CBC  WBC 4.0 - 10.5 K/uL 19.2  6.5  5.6   Hemoglobin 12.0 - 15.0 g/dL 85.7  87.1  87.5   Hematocrit 36.0 - 46.0 % 39.8  35.6  35.5   Platelets 150 - 400 K/uL 154  164  151        Latest Ref Rng & Units 11/21/2023   10:19 AM 11/14/2023   11:18 AM 10/02/2023    1:52 PM  CMP  Glucose 70 - 99 mg/dL 83  894  93   BUN 8 - 23 mg/dL 24  20  20    Creatinine 0.44 - 1.00 mg/dL 8.99  9.06  9.11   Sodium 135 - 145 mmol/L 128  134  138   Potassium 3.5 - 5.1 mmol/L 4.7  4.2  4.4   Chloride 98 - 111 mmol/L 94  100  104  CO2 22 - 32 mmol/L 29  27  28    Calcium 8.9 - 10.3 mg/dL 9.7  9.7  9.3    Total Protein 6.5 - 8.1 g/dL 6.6  6.7  6.4   Total Bilirubin 0.0 - 1.2 mg/dL 1.2  1.6  1.6   Alkaline Phos 38 - 126 U/L 119  106  106   AST 15 - 41 U/L 24  20  18    ALT 0 - 44 U/L 30  18  16      RADIOGRAPHIC STUDIES: CUP PACEART REMOTE DEVICE CHECK Result Date: 11/01/2023 PPM Scheduled remote reviewed. Normal device function.  Presenting rhythm: AP-VS, PACs.  3 VHR detections, EGMs consistent with atrial driven tachycardia. Next remote 91 days. - CS, CVRS   ASSESSMENT & PLAN Amy [name] 78 y.o. female with medical history significant for newly diagnosed splenic marginal zone lymphoma who presents for a follow up visit.  # Splenic Marginal Zone Lymphoma-symptomatic  -- Patient has history of a splenomegaly which has been negative on PET CT scan.  Last imaging of the abdomen on 04/02/2023 showed worsening splenomegaly. -- Flow cytometry showed evidence of monoclonal B-cell population, findings most consistent with a splenic marginal zone lymphoma. --Patient meets the criteria for treatment based on symptomatic splenomegaly. -- Will start rituximab monotherapy with rituximab x 4 weeks weekly, followed by q. 8 weeks for 2 years. --Started Cycle 1, Day 1 of Rituximab on 11/14/2023.  PLAN: --Due for Cycle 3, Day 1 of Rituximab today -- Labs today show *** -- Return to clinic in 1 week prior to cycle 4, day 1  #Supportive Care -- chemotherapy education complete -- port placed -- allopurinol 300mg  PO daily for TLS prophylaxis -- no pain medication required at this time.    No orders of the defined types were placed in this encounter.   All questions were answered. The patient knows to call the clinic with any problems, questions or concerns.   I have spent a total of 30 minutes minutes of face-to-face and non-face-to-face time, preparing to see the patient, performing a medically appropriate examination, counseling and educating the patient,  documenting clinical information in the  electronic health record, independently interpreting results and communicating results to the patient, and care coordination.    Johnston Police PA-C Dept of Hematology and Oncology [facility] at [facility] Phone: [phone]   11/27/2023 9:40 PM

## 2023-11-28 ENCOUNTER — Inpatient Hospital Stay (HOSPITAL_BASED_OUTPATIENT_CLINIC_OR_DEPARTMENT_OTHER): Admitting: Physician Assistant

## 2023-11-28 ENCOUNTER — Other Ambulatory Visit: Payer: Self-pay | Admitting: Hematology and Oncology

## 2023-11-28 ENCOUNTER — Ambulatory Visit

## 2023-11-28 ENCOUNTER — Inpatient Hospital Stay

## 2023-11-28 VITALS — BP 136/42 | HR 65 | Temp 97.5°F | Resp 16 | Wt 142.5 lb

## 2023-11-28 DIAGNOSIS — C8580 Other specified types of non-Hodgkin lymphoma, unspecified site: Secondary | ICD-10-CM | POA: Diagnosis not present

## 2023-11-28 DIAGNOSIS — I2510 Atherosclerotic heart disease of native coronary artery without angina pectoris: Secondary | ICD-10-CM | POA: Diagnosis not present

## 2023-11-28 DIAGNOSIS — R161 Splenomegaly, not elsewhere classified: Secondary | ICD-10-CM

## 2023-11-28 DIAGNOSIS — C8307 Small cell B-cell lymphoma, spleen: Secondary | ICD-10-CM | POA: Diagnosis not present

## 2023-11-28 DIAGNOSIS — I10 Essential (primary) hypertension: Secondary | ICD-10-CM | POA: Diagnosis not present

## 2023-11-28 DIAGNOSIS — E785 Hyperlipidemia, unspecified: Secondary | ICD-10-CM | POA: Diagnosis not present

## 2023-11-28 DIAGNOSIS — Z5112 Encounter for antineoplastic immunotherapy: Secondary | ICD-10-CM | POA: Diagnosis not present

## 2023-11-28 DIAGNOSIS — E039 Hypothyroidism, unspecified: Secondary | ICD-10-CM | POA: Diagnosis not present

## 2023-11-28 LAB — CMP (CANCER CENTER ONLY)
ALT: 18 U/L (ref 0–44)
AST: 17 U/L (ref 15–41)
Albumin: 4.1 g/dL (ref 3.5–5.0)
Alkaline Phosphatase: 108 U/L (ref 38–126)
Anion gap: 8 (ref 5–15)
BUN: 22 mg/dL (ref 8–23)
CO2: 26 mmol/L (ref 22–32)
Calcium: 9.3 mg/dL (ref 8.9–10.3)
Chloride: 94 mmol/L — ABNORMAL LOW (ref 98–111)
Creatinine: 0.98 mg/dL (ref 0.44–1.00)
GFR, Estimated: 59 mL/min — ABNORMAL LOW (ref >60)
Glucose, Bld: 99 mg/dL (ref 70–99)
Potassium: 4.2 mmol/L (ref 3.5–5.1)
Sodium: 128 mmol/L — ABNORMAL LOW (ref 135–145)
Total Bilirubin: 2.3 mg/dL — ABNORMAL HIGH (ref 0.0–1.2)
Total Protein: 6.4 g/dL — ABNORMAL LOW (ref 6.5–8.1)

## 2023-11-28 LAB — CBC WITH DIFFERENTIAL (CANCER CENTER ONLY)
Abs Immature Granulocytes: 0.02 K/uL (ref 0.00–0.07)
Basophils Absolute: 0 K/uL (ref 0.0–0.1)
Basophils Relative: 0 %
Eosinophils Absolute: 0.2 K/uL (ref 0.0–0.5)
Eosinophils Relative: 2 %
HCT: 36.6 % (ref 36.0–46.0)
Hemoglobin: 13.6 g/dL (ref 12.0–15.0)
Immature Granulocytes: 0 %
Lymphocytes Relative: 13 %
Lymphs Abs: 1.3 K/uL (ref 0.7–4.0)
MCH: 31.6 pg (ref 26.0–34.0)
MCHC: 37.2 g/dL — ABNORMAL HIGH (ref 30.0–36.0)
MCV: 85.1 fL (ref 80.0–100.0)
Monocytes Absolute: 0.8 K/uL (ref 0.1–1.0)
Monocytes Relative: 7 %
Neutro Abs: 7.8 K/uL — ABNORMAL HIGH (ref 1.7–7.7)
Neutrophils Relative %: 78 %
Platelet Count: 189 K/uL (ref 150–400)
RBC: 4.3 MIL/uL (ref 3.87–5.11)
RDW: 12.6 % (ref 11.5–15.5)
WBC Count: 10.1 K/uL (ref 4.0–10.5)
nRBC: 0 % (ref 0.0–0.2)

## 2023-11-28 LAB — LACTATE DEHYDROGENASE: LDH: 137 U/L (ref 98–192)

## 2023-11-28 LAB — URIC ACID: Uric Acid, Serum: 4.1 mg/dL (ref 2.5–7.1)

## 2023-11-28 MED ORDER — CETIRIZINE HCL 10 MG PO TABS
10.0000 mg | ORAL_TABLET | Freq: Once | ORAL | Status: AC
  Administered 2023-11-28: 10 mg via ORAL
  Filled 2023-11-28: qty 1

## 2023-11-28 MED ORDER — SODIUM CHLORIDE 0.9 % IV SOLN
375.0000 mg/m2 | Freq: Once | INTRAVENOUS | Status: AC
  Administered 2023-11-28: 600 mg via INTRAVENOUS
  Filled 2023-11-28: qty 50

## 2023-11-28 MED ORDER — METHYLPREDNISOLONE SODIUM SUCC 40 MG IJ SOLR
40.0000 mg | Freq: Once | INTRAMUSCULAR | Status: AC
  Administered 2023-11-28: 40 mg via INTRAVENOUS
  Filled 2023-11-28: qty 1

## 2023-11-28 MED ORDER — FAMOTIDINE IN NACL 20-0.9 MG/50ML-% IV SOLN
20.0000 mg | Freq: Once | INTRAVENOUS | Status: AC
  Administered 2023-11-28: 20 mg via INTRAVENOUS
  Filled 2023-11-28: qty 50

## 2023-11-28 MED ORDER — DIPHENHYDRAMINE HCL 25 MG PO CAPS: 50.0000 mg | ORAL_CAPSULE | Freq: Once | ORAL | Status: DC

## 2023-11-28 MED ORDER — ACETAMINOPHEN 325 MG PO TABS
650.0000 mg | ORAL_TABLET | Freq: Once | ORAL | Status: AC
  Administered 2023-11-28: 650 mg via ORAL
  Filled 2023-11-28: qty 2

## 2023-11-28 MED ORDER — SODIUM CHLORIDE 0.9 % IV SOLN: INTRAVENOUS | Status: DC

## 2023-11-28 NOTE — Patient Instructions (Signed)
 [facility] - A DEPT OF [facility]  Discharge Instructions: Thank you for choosing [facility] to provide your oncology and hematology care.   If you have a lab appointment with the Cancer Center, please go directly to the Cancer Center and check in at the registration area.   Wear comfortable clothing and clothing appropriate for easy access to any Portacath or PICC line.   We strive to give you quality time with your provider. You may need to reschedule your appointment if you arrive late (15 or more minutes).  Arriving late affects you and other patients whose appointments are after yours.  Also, if you miss three or more appointments without notifying the office, you may be dismissed from the clinic at the provider's discretion.      For prescription refill requests, have your pharmacy contact our office and allow 72 hours for refills to be completed.    Today you received the following chemotherapy and/or immunotherapy agents rituxan      To help prevent nausea and vomiting after your treatment, we encourage you to take your nausea medication as directed.  BELOW ARE SYMPTOMS THAT SHOULD BE REPORTED IMMEDIATELY: *FEVER GREATER THAN 100.4 F (38 C) OR HIGHER *CHILLS OR SWEATING *NAUSEA AND VOMITING THAT IS NOT CONTROLLED WITH YOUR NAUSEA MEDICATION *UNUSUAL SHORTNESS OF BREATH *UNUSUAL BRUISING OR BLEEDING *URINARY PROBLEMS (pain or burning when urinating, or frequent urination) *BOWEL PROBLEMS (unusual diarrhea, constipation, pain near the anus) TENDERNESS IN MOUTH AND THROAT WITH OR WITHOUT PRESENCE OF ULCERS (sore throat, sores in mouth, or a toothache) UNUSUAL RASH, SWELLING OR PAIN  UNUSUAL VAGINAL DISCHARGE OR ITCHING   Items with * indicate a potential emergency and should be followed up as soon as possible or go to the Emergency Department if any problems should occur.  Please show the CHEMOTHERAPY ALERT CARD or IMMUNOTHERAPY  ALERT CARD at check-in to the Emergency Department and triage nurse.  Should you have questions after your visit or need to cancel or reschedule your appointment, please contact [facility] - A DEPT [facility]  Dept: [phone]  and follow the prompts.  Office hours are 8:00 a.m. to 4:30 p.m. Monday - Friday. Please note that voicemails left after 4:00 p.m. may not be returned until the following business day.  We are closed weekends and major holidays. You have access to a nurse at all times for urgent questions. Please call the main number to the clinic Dept: [phone] and follow the prompts.   For any non-urgent questions, you may also contact your provider using MyChart. We now offer e-Visits for anyone 2 and older to request care online for non-urgent symptoms. For details visit [email or web address].   Also download the MyChart app! Go to the app store, search "MyChart", open the app, select [place], and log in with your MyChart username and password.

## 2023-12-04 ENCOUNTER — Encounter (INDEPENDENT_AMBULATORY_CARE_PROVIDER_SITE_OTHER): Payer: Self-pay | Admitting: Gastroenterology

## 2023-12-04 NOTE — Progress Notes (Unsigned)
 [facility] [phone]   [phone]  PROGRESS NOTE  Patient Care Team: Cook, Jayce G, DO as PCP - General (Family Medicine) Debera Jayson MATSU, MD as PCP - Cardiology (Cardiology) Center, [facility] as Attending Physician Waddell Danelle ORN, MD as Consulting Physician (Cardiology) Harrie Agent, MD as Consulting Physician (Ophthalmology) Debera Jayson MATSU, MD as Consulting Physician (Cardiology) Court Dorn PARAS, MD as Consulting Physician (Cardiology) Olegario Messier, MD as Referring Physician (Obstetrics and Gynecology) Eartha Flavors, Toribio, MD as Consulting Physician (Gastroenterology)  Hematological/Oncological History # Splenic Marginal Zone Lymphoma 04/10/2023: last visit with Dr. Katragadda at [facility] 10/02/2023: establish care with Dr. Federico. Flow cytometry confirms monoclonal B cell population, consistent with splenic marginal zone lymphoma 11/14/2023: C1D1 of Rituximab monotherapy.   Interval History:  Amy [name] 78 y.o. female with medical history significant for newly diagnosed splenic marginal zone lymphoma who presents for a follow up visit.  She presents today for cycle 4 day 1 of rituximab monotherapy.  On exam today [name] reports having ongoing fatigue which can impact her ADLs.  She has a good appetite without any dietary restrictions.  She had 1 episode of vomiting after eating greasy foods  She denies any bowel habit changes including recurrent episodes of diarrhea or constipation.  She denies easy bruising or signs of active bleeding.  Patient still has tremors the day of treatment which resolves on its own.  She experienced sweats after the last cycle for one day.  She denies fevers, chills, shortness of breath, chest pain or cough.  She has no other complaints.  Rest of the 10 point ROS as below.  MEDICAL HISTORY:  Past Medical History:  Diagnosis Date   Asthma    CAD (coronary artery disease)    60-70%  proximal LAD/diagonal bifurcational disease October 2015 at [facility] status DES x 2 to LAD/diagonal October 2015   Essential hypertension    Heart murmur    Hyperlipidemia    Hypothyroidism    PONV (postoperative nausea and vomiting)    Presence of permanent cardiac pacemaker    Sinus node dysfunction (HCC)    Biotronik pacemaker in place   Spleen enlarged     SURGICAL HISTORY: Past Surgical History:  Procedure Laterality Date   ABDOMINAL HYSTERECTOMY     ANAL FISSURE REPAIR     CARDIAC CATHETERIZATION     CATARACT EXTRACTION W/PHACO Right 10/17/2014   Procedure: CATARACT EXTRACTION PHACO AND INTRAOCULAR LENS PLACEMENT (IOC);  Surgeon: Cherene Mania, MD;  Location: [facility]S;  Service: Ophthalmology;  Laterality: Right;  CDE: 4.88   CATARACT EXTRACTION W/PHACO Left 10/27/2014   Procedure: CATARACT EXTRACTION PHACO AND INTRAOCULAR LENS PLACEMENT (IOC);  Surgeon: Cherene Mania, MD;  Location: [facility];  Service: Ophthalmology;  Laterality: Left;  CDE:7.86   COLONOSCOPY N/A 04/16/2018   Procedure: COLONOSCOPY;  Surgeon: Golda Claudis PENNER, MD;  Location: [facility] END[facility] SUITE;  Service: Endoscopy;  Laterality: N/A;  830   ESOPHAGOGASTRODUODENOSCOPY N/A 09/30/2023   Procedure: EGD (ESOPHAGOGASTRODUODENOSCOPY);  Surgeon: Eartha Flavors, Toribio, MD;  Location: [facility];  Service: Gastroenterology;  Laterality: N/A;  10:15 am, asa 1-2   FINGER SURGERY     INSERT / REPLACE / REMOVE PACEMAKER     NASAL SINUS SURGERY     PACEMAKER IMPLANT N/A 07/30/2016   Procedure: Pacemaker Implant Dual Chamber;  Surgeon: Waddell Danelle ORN, MD;  Location: [facility] INVASIVE CV LAB;  Service: Cardiovascular;  Laterality: N/A;   TONSILLECTOMY  SOCIAL HISTORY: Social History   Socioeconomic History   Marital status: Widowed    Spouse name: Not on file   Number of children: 1   Years of education: Not on file   Highest education level: 12th grade  Occupational History   Not on file  Tobacco Use   Smoking status: Never    Smokeless tobacco: Never  Vaping Use   Vaping status: Never Used  Substance and Sexual Activity   Alcohol  use: No   Drug use: No   Sexual activity: Not Currently    Birth control/protection: None  Other Topics Concern   Not on file  Social History Narrative   Not on file   Social Drivers of Health   Financial Resource Strain: Low Risk  (10/09/2023)   Overall Financial Resource Strain (CARDIA)    Difficulty of Paying Living Expenses: Not hard at all  Food Insecurity: No Food Insecurity (10/09/2023)   Hunger Vital Sign    Worried About Running Out of Food in the Last Year: Never true    Ran Out of Food in the Last Year: Never true  Transportation Needs: No Transportation Needs (10/09/2023)   PRAPARE - Administrator, Civil Service (Medical): No    Lack of Transportation (Non-Medical): No  Physical Activity: Insufficiently Active (10/09/2023)   Exercise Vital Sign    Days of Exercise per Week: 2 days    Minutes of Exercise per Session: 30 min  Stress: No Stress Concern Present (10/09/2023)   [name] of Occupational Health - Occupational Stress Questionnaire    Feeling of Stress: Not at all  Social Connections: Moderately Integrated (10/09/2023)   Social Connection and Isolation Panel    Frequency of Communication with Friends and Family: More than three times a week    Frequency of Social Gatherings with Friends and Family: More than three times a week    Attends Religious Services: More than 4 times per year    Active Member of [employer] or Organizations: Yes    Attends Banker Meetings: More than 4 times per year    Marital Status: Widowed  Intimate Partner Violence: Not At Risk (10/02/2023)   Humiliation, Afraid, Rape, and Kick questionnaire    Fear of Current or Ex-Partner: No    Emotionally Abused: No    Physically Abused: No    Sexually Abused: No    FAMILY HISTORY: Family History  Problem Relation Age of Onset   Heart disease Mother     Parkinson's disease Mother    Heart disease Father     ALLERGIES:  is allergic to truxima [rituximab] and sulfa antibiotics.  MEDICATIONS:  Current Outpatient Medications  Medication Sig Dispense Refill   amLODipine  (NORVASC ) 10 MG tablet TAKE 1 TABLET BY MOUTH EVERY EVENING 90 tablet 3   aspirin  81 MG tablet Take 81 mg by mouth daily.     atorvastatin (LIPITOR) 20 MG tablet TAKE 1 TABLET BY MOUTH EVERY DAY FOR CHOLESTEROL 90 tablet 3   betamethasone valerate ointment (VALISONE) 0.1 % APPLY TO THE AFFECTED AREA(S) TWICE DAILY (Patient taking differently: Apply topically daily. to affected area(s)) 45 g 2   calcium carbonate (OSCAL) 1500 (600 Ca) MG TABS tablet Take 1,500 mg by mouth daily with breakfast.     Cholecalciferol  (VITAMIN D3) 2000 units capsule Take 2,000 Units by mouth daily at 12 noon.      clobetasol  (TEMOVATE ) 0.05 % external solution Apply 1 application  topically as needed (psoriasis).  clopidogrel  (PLAVIX ) 75 MG tablet TAKE 1 TABLET BY MOUTH DAILY 90 tablet 3   cycloSPORINE (RESTASIS) 0.05 % ophthalmic emulsion 1 drop 2 (two) times daily.     hydrochlorothiazide (HYDRODIURIL) 12.5 MG tablet TAKE 1 TABLET BY MOUTH EVERY DAY HIGH BLOOD PRESSURE 90 tablet 3   isosorbide  mononitrate (IMDUR ) 60 MG 24 hr tablet TAKE 1 TABLET BY MOUTH DAILY 30 tablet 2   levothyroxine  (SYNTHROID ) 75 MCG tablet TAKE 1 TABLET BY MOUTH EVERY MORNING 90 tablet 3   magnesium  oxide (MAG-OX) 400 (240 Mg) MG tablet TAKE 1/2 TABLET BY MOUTH DAILY 45 tablet 3   Multiple Vitamin (MULTIVITAMIN WITH MINERALS) TABS tablet Take 1 tablet by mouth daily at 12 noon.      nystatin cream (MYCOSTATIN) Apply 1 Application topically 2 (two) times daily.     ondansetron  (ZOFRAN -ODT) 4 MG disintegrating tablet Take 1 tablet (4 mg total) by mouth every 8 (eight) hours as needed for nausea or vomiting. 60 tablet 3   pantoprazole  (PROTONIX ) 40 MG tablet Take 1 tablet (40 mg total) by mouth 2 (two) times daily before a  meal. 180 tablet 1   triamcinolone ointment (KENALOG) 0.5 % Apply 1 Application topically daily at 6 (six) AM.     valsartan  (DIOVAN ) 160 MG tablet Take 1 tablet (160 mg total) by mouth daily. 90 tablet 1   No current facility-administered medications for this visit.    REVIEW OF SYSTEMS:   Constitutional: ( - ) fevers, ( - )  chills , ( - ) night sweats Eyes: ( - ) blurriness of vision, ( - ) double vision, ( - ) watery eyes Ears, nose, mouth, throat, and face: ( - ) mucositis, ( - ) sore throat Respiratory: ( - ) cough, ( - ) dyspnea, ( - ) wheezes Cardiovascular: ( - ) palpitation, ( - ) chest discomfort, ( - ) lower extremity swelling Gastrointestinal:  ( - ) nausea, ( - ) heartburn, ( - ) change in bowel habits Skin: ( - ) abnormal skin rashes Lymphatics: ( - ) new lymphadenopathy, ( - ) easy bruising Neurological: ( - ) numbness, ( - ) tingling, ( - ) new weaknesses Behavioral/Psych: ( - ) mood change, ( - ) new changes  All other systems were reviewed with the patient and are negative.  PHYSICAL EXAMINATION: ECOG PERFORMANCE STATUS: 1 - Symptomatic but completely ambulatory  Vitals:   12/05/23 0844  BP: (!) 144/68  Pulse: 76  Resp: 15  Temp: 97.9 F (36.6 C)  SpO2: 98%   Filed Weights   12/05/23 0844  Weight: 142 lb 9.6 oz (64.7 kg)    GENERAL: Well-appearing elderly Caucasian female, alert, no distress and comfortable SKIN: skin color, texture, turgor are normal, no rashes or significant lesions EYES: conjunctiva are pink and non-injected, sclera clear LUNGS: clear to auscultation and percussion with normal breathing effort HEART: regular rate & rhythm and no murmurs and no lower extremity edema Musculoskeletal: no cyanosis of digits and no clubbing  PSYCH: alert & oriented x 3, fluent speech NEURO: no focal motor/sensory deficits  LABORATORY DATA:  I have reviewed the data as listed    Latest Ref Rng & Units 12/05/2023    7:32 AM 11/28/2023    9:17 AM  11/21/2023   10:19 AM  CBC  WBC 4.0 - 10.5 K/uL 7.3  10.1  19.2   Hemoglobin 12.0 - 15.0 g/dL 87.4  86.3  85.7   Hematocrit 36.0 - 46.0 % 34.0  36.6  39.8   Platelets 150 - 400 K/uL 184  189  154        Latest Ref Rng & Units 12/05/2023    7:32 AM 11/28/2023    9:17 AM 11/21/2023   10:19 AM  CMP  Glucose 70 - 99 mg/dL 893  99  83   BUN 8 - 23 mg/dL 23  22  24    Creatinine 0.44 - 1.00 mg/dL 9.03  9.01  8.99   Sodium 135 - 145 mmol/L 131  128  128   Potassium 3.5 - 5.1 mmol/L 4.3  4.2  4.7   Chloride 98 - 111 mmol/L 97  94  94   CO2 22 - 32 mmol/L 29  26  29    Calcium 8.9 - 10.3 mg/dL 9.2  9.3  9.7   Total Protein 6.5 - 8.1 g/dL 6.2  6.4  6.6   Total Bilirubin 0.0 - 1.2 mg/dL 2.0  2.3  1.2   Alkaline Phos 38 - 126 U/L 97  108  119   AST 15 - 41 U/L 15  17  24    ALT 0 - 44 U/L 16  18  30      RADIOGRAPHIC STUDIES: No results found.   ASSESSMENT & PLAN Amy [name] 78 y.o. female with medical history significant for newly diagnosed splenic marginal zone lymphoma who presents for a follow up visit.  # Splenic Marginal Zone Lymphoma-symptomatic  -- Patient has history of a splenomegaly which has been negative on PET CT scan.  Last imaging of the abdomen on 04/02/2023 showed worsening splenomegaly. -- Flow cytometry showed evidence of monoclonal B-cell population, findings most consistent with a splenic marginal zone lymphoma. --Patient meets the criteria for treatment based on symptomatic splenomegaly. -- Will start rituximab monotherapy with rituximab x 4 weeks weekly, followed by q. 8 weeks for 2 years. --Started Cycle 1, Day 1 of Rituximab on 11/14/2023.  PLAN: --Due for Cycle 4, Day 1 of Rituximab today --Labs today show WBC 7.3, hemoglobin 12.5, platelet 184, creatinine and LFTs are in range. --Proceed with treatment today as planned without any dose modifications. -- Return to clinic in 4 weeks with Dr. Federico  #Supportive Care -- chemotherapy education complete --  port placed -- allopurinol 300mg  PO daily for TLS prophylaxis -- no pain medication required at this time.    No orders of the defined types were placed in this encounter.   All questions were answered. The patient knows to call the clinic with any problems, questions or concerns.   I have spent a total of 30 minutes minutes of face-to-face and non-face-to-face time, preparing to see the patient, performing a medically appropriate examination, counseling and educating the patient,  documenting clinical information in the electronic health record, independently interpreting results and communicating results to the patient, and care coordination.    Johnston Police PA-C Dept of Hematology and Oncology [facility] Phone: [phone]   12/05/2023 9:04 AM

## 2023-12-05 ENCOUNTER — Telehealth: Payer: Self-pay

## 2023-12-05 ENCOUNTER — Inpatient Hospital Stay

## 2023-12-05 ENCOUNTER — Inpatient Hospital Stay: Admitting: Physician Assistant

## 2023-12-05 VITALS — BP 144/68 | HR 76 | Temp 97.9°F | Resp 15 | Wt 142.6 lb

## 2023-12-05 VITALS — BP 130/43 | HR 60 | Temp 97.7°F | Resp 16

## 2023-12-05 DIAGNOSIS — I2510 Atherosclerotic heart disease of native coronary artery without angina pectoris: Secondary | ICD-10-CM | POA: Diagnosis not present

## 2023-12-05 DIAGNOSIS — R161 Splenomegaly, not elsewhere classified: Secondary | ICD-10-CM

## 2023-12-05 DIAGNOSIS — C8580 Other specified types of non-Hodgkin lymphoma, unspecified site: Secondary | ICD-10-CM | POA: Diagnosis not present

## 2023-12-05 DIAGNOSIS — I10 Essential (primary) hypertension: Secondary | ICD-10-CM | POA: Diagnosis not present

## 2023-12-05 DIAGNOSIS — Z5112 Encounter for antineoplastic immunotherapy: Secondary | ICD-10-CM | POA: Diagnosis not present

## 2023-12-05 DIAGNOSIS — E785 Hyperlipidemia, unspecified: Secondary | ICD-10-CM | POA: Diagnosis not present

## 2023-12-05 DIAGNOSIS — C8307 Small cell B-cell lymphoma, spleen: Secondary | ICD-10-CM | POA: Diagnosis not present

## 2023-12-05 DIAGNOSIS — E039 Hypothyroidism, unspecified: Secondary | ICD-10-CM | POA: Diagnosis not present

## 2023-12-05 LAB — CMP (CANCER CENTER ONLY)
ALT: 16 U/L (ref 0–44)
AST: 15 U/L (ref 15–41)
Albumin: 4 g/dL (ref 3.5–5.0)
Alkaline Phosphatase: 97 U/L (ref 38–126)
Anion gap: 5 (ref 5–15)
BUN: 23 mg/dL (ref 8–23)
CO2: 29 mmol/L (ref 22–32)
Calcium: 9.2 mg/dL (ref 8.9–10.3)
Chloride: 97 mmol/L — ABNORMAL LOW (ref 98–111)
Creatinine: 0.96 mg/dL (ref 0.44–1.00)
GFR, Estimated: >60 mL/min (ref >60)
Glucose, Bld: 106 mg/dL — ABNORMAL HIGH (ref 70–99)
Potassium: 4.3 mmol/L (ref 3.5–5.1)
Sodium: 131 mmol/L — ABNORMAL LOW (ref 135–145)
Total Bilirubin: 2 mg/dL — ABNORMAL HIGH (ref 0.0–1.2)
Total Protein: 6.2 g/dL — ABNORMAL LOW (ref 6.5–8.1)

## 2023-12-05 LAB — CBC WITH DIFFERENTIAL (CANCER CENTER ONLY)
Abs Immature Granulocytes: 0.01 K/uL (ref 0.00–0.07)
Basophils Absolute: 0 K/uL (ref 0.0–0.1)
Basophils Relative: 0 %
Eosinophils Absolute: 0.2 K/uL (ref 0.0–0.5)
Eosinophils Relative: 2 %
HCT: 34 % — ABNORMAL LOW (ref 36.0–46.0)
Hemoglobin: 12.5 g/dL (ref 12.0–15.0)
Immature Granulocytes: 0 %
Lymphocytes Relative: 12 %
Lymphs Abs: 0.9 K/uL (ref 0.7–4.0)
MCH: 31.4 pg (ref 26.0–34.0)
MCHC: 36.8 g/dL — ABNORMAL HIGH (ref 30.0–36.0)
MCV: 85.4 fL (ref 80.0–100.0)
Monocytes Absolute: 0.6 K/uL (ref 0.1–1.0)
Monocytes Relative: 8 %
Neutro Abs: 5.6 K/uL (ref 1.7–7.7)
Neutrophils Relative %: 78 %
Platelet Count: 184 K/uL (ref 150–400)
RBC: 3.98 MIL/uL (ref 3.87–5.11)
RDW: 13.1 % (ref 11.5–15.5)
WBC Count: 7.3 K/uL (ref 4.0–10.5)
nRBC: 0 % (ref 0.0–0.2)

## 2023-12-05 LAB — LACTATE DEHYDROGENASE: LDH: 127 U/L (ref 98–192)

## 2023-12-05 LAB — URIC ACID: Uric Acid, Serum: 4 mg/dL (ref 2.5–7.1)

## 2023-12-05 MED ORDER — ACETAMINOPHEN 325 MG PO TABS
650.0000 mg | ORAL_TABLET | Freq: Once | ORAL | Status: AC
  Administered 2023-12-05: 650 mg via ORAL
  Filled 2023-12-05: qty 2

## 2023-12-05 MED ORDER — CETIRIZINE HCL 10 MG PO TABS
10.0000 mg | ORAL_TABLET | Freq: Once | ORAL | Status: AC
  Administered 2023-12-05: 10 mg via ORAL
  Filled 2023-12-05: qty 1

## 2023-12-05 MED ORDER — FAMOTIDINE IN NACL 20-0.9 MG/50ML-% IV SOLN
20.0000 mg | Freq: Once | INTRAVENOUS | Status: AC
  Administered 2023-12-05: 20 mg via INTRAVENOUS
  Filled 2023-12-05: qty 50

## 2023-12-05 MED ORDER — SODIUM CHLORIDE 0.9 % IV SOLN: INTRAVENOUS | Status: DC

## 2023-12-05 MED ORDER — METHYLPREDNISOLONE SODIUM SUCC 40 MG IJ SOLR
40.0000 mg | Freq: Once | INTRAMUSCULAR | Status: AC
  Administered 2023-12-05: 40 mg via INTRAVENOUS
  Filled 2023-12-05: qty 1

## 2023-12-05 MED ORDER — SODIUM CHLORIDE 0.9 % IV SOLN
375.0000 mg/m2 | Freq: Once | INTRAVENOUS | Status: AC
  Administered 2023-12-05: 600 mg via INTRAVENOUS
  Filled 2023-12-05: qty 50

## 2023-12-05 NOTE — Telephone Encounter (Signed)
 No messages received for at least 21 days Last message received 21 days ago. The patient will be deactivated in 69 days.  Will contact Pt to reestablish remote monitoring.

## 2023-12-05 NOTE — Patient Instructions (Signed)
 [facility] - A DEPT OF [facility]  Discharge Instructions: Thank you for choosing [facility] to provide your oncology and hematology care.   If you have a lab appointment with the Cancer Center, please go directly to the Cancer Center and check in at the registration area.   Wear comfortable clothing and clothing appropriate for easy access to any Portacath or PICC line.   We strive to give you quality time with your provider. You may need to reschedule your appointment if you arrive late (15 or more minutes).  Arriving late affects you and other patients whose appointments are after yours.  Also, if you miss three or more appointments without notifying the office, you may be dismissed from the clinic at the provider's discretion.      For prescription refill requests, have your pharmacy contact our office and allow 72 hours for refills to be completed.    Today you received the following chemotherapy and/or immunotherapy agents rituxan      To help prevent nausea and vomiting after your treatment, we encourage you to take your nausea medication as directed.  BELOW ARE SYMPTOMS THAT SHOULD BE REPORTED IMMEDIATELY: *FEVER GREATER THAN 100.4 F (38 C) OR HIGHER *CHILLS OR SWEATING *NAUSEA AND VOMITING THAT IS NOT CONTROLLED WITH YOUR NAUSEA MEDICATION *UNUSUAL SHORTNESS OF BREATH *UNUSUAL BRUISING OR BLEEDING *URINARY PROBLEMS (pain or burning when urinating, or frequent urination) *BOWEL PROBLEMS (unusual diarrhea, constipation, pain near the anus) TENDERNESS IN MOUTH AND THROAT WITH OR WITHOUT PRESENCE OF ULCERS (sore throat, sores in mouth, or a toothache) UNUSUAL RASH, SWELLING OR PAIN  UNUSUAL VAGINAL DISCHARGE OR ITCHING   Items with * indicate a potential emergency and should be followed up as soon as possible or go to the Emergency Department if any problems should occur.  Please show the CHEMOTHERAPY ALERT CARD or IMMUNOTHERAPY  ALERT CARD at check-in to the Emergency Department and triage nurse.  Should you have questions after your visit or need to cancel or reschedule your appointment, please contact [facility] - A DEPT [facility]  Dept: [phone]  and follow the prompts.  Office hours are 8:00 a.m. to 4:30 p.m. Monday - Friday. Please note that voicemails left after 4:00 p.m. may not be returned until the following business day.  We are closed weekends and major holidays. You have access to a nurse at all times for urgent questions. Please call the main number to the clinic Dept: [phone] and follow the prompts.   For any non-urgent questions, you may also contact your provider using MyChart. We now offer e-Visits for anyone 2 and older to request care online for non-urgent symptoms. For details visit [email or web address].   Also download the MyChart app! Go to the app store, search "MyChart", open the app, select [place], and log in with your MyChart username and password.

## 2023-12-05 NOTE — Telephone Encounter (Signed)
 Spoke with daughter April. Pt has been living with her and she will get the monitor and bring it to her house. Pt was thankful for the call

## 2023-12-15 ENCOUNTER — Ambulatory Visit (HOSPITAL_COMMUNITY)
Admission: RE | Admit: 2023-12-15 | Discharge: 2023-12-15 | Disposition: A | Discharge status: Home / Self Care | Payer: Medicare Other | Source: Ambulatory Visit | Attending: Cardiovascular Disease | Admitting: Cardiovascular Disease

## 2023-12-15 ENCOUNTER — Ambulatory Visit: Payer: Self-pay | Admitting: Cardiovascular Disease

## 2023-12-15 ENCOUNTER — Ambulatory Visit (HOSPITAL_BASED_OUTPATIENT_CLINIC_OR_DEPARTMENT_OTHER)
Admission: RE | Admit: 2023-12-15 | Discharge: 2023-12-15 | Disposition: A | Discharge status: Home / Self Care | Payer: Medicare Other | Source: Ambulatory Visit | Attending: Cardiovascular Disease | Admitting: Cardiovascular Disease

## 2023-12-15 DIAGNOSIS — I739 Peripheral vascular disease, unspecified: Secondary | ICD-10-CM | POA: Diagnosis present

## 2023-12-15 DIAGNOSIS — I6523 Occlusion and stenosis of bilateral carotid arteries: Secondary | ICD-10-CM | POA: Diagnosis not present

## 2023-12-15 DIAGNOSIS — I701 Atherosclerosis of renal artery: Secondary | ICD-10-CM | POA: Insufficient documentation

## 2023-12-17 NOTE — Telephone Encounter (Signed)
 Spoke with pt daughter and they did not want to have EGD w/ bravo done with Dr. Cinderella. Reports she feels all her symptoms are related to her hernia.  The daughter wants someone to fix the hernia and not keeping doing test after test or just watching when they know it is the hernia. She stated the PCP sent her to us  to have the hernia fixed. Also stated the patient is eating less and less, losing weight, having to sleep sitting up. She thinks maybe at this point she may need to get a second opinion to get her hernia fixed if we aren't going to do anything about it and the patient getting worse.  Please advise Dr. Eartha thanks!

## 2023-12-22 NOTE — Telephone Encounter (Signed)
 I am sorry to hear that she is having these symptoms.  I do not have very strong evidence that her symptoms are related to her hiatal hernia.  However, if she is very concerned about this and wants to get an opinion for this, she should be evaluated by a surgeon to explain the risks and potential benefits of this.  The surgeon may require to do some further testing including Bravo testing and potential pH impedance testing.  However, I will defer this to the surgeon as she is reluctant to proceed with EGD with Bravo.  Mindy, please discuss with her that we will refer her to a foregut surgeon at [facility] to discuss this further.  Ann, please send a referral to [facility] surgery, diagnosis hiatal hernia and nausea, request for opinion regarding hiatal hernia repair.  Thanks

## 2023-12-22 NOTE — Telephone Encounter (Signed)
 Spoke with daughter and they want to proceed with the referral. Aware [facility] will contact them to schedule.

## 2023-12-22 NOTE — Telephone Encounter (Signed)
 Referral sent, they will contact patient with apt

## 2023-12-24 ENCOUNTER — Ambulatory Visit

## 2023-12-29 DIAGNOSIS — H02403 Unspecified ptosis of bilateral eyelids: Secondary | ICD-10-CM | POA: Diagnosis not present

## 2023-12-29 DIAGNOSIS — H04123 Dry eye syndrome of bilateral lacrimal glands: Secondary | ICD-10-CM | POA: Diagnosis not present

## 2023-12-30 ENCOUNTER — Other Ambulatory Visit (HOSPITAL_COMMUNITY): Payer: Self-pay | Admitting: General Surgery

## 2023-12-30 DIAGNOSIS — R6881 Early satiety: Secondary | ICD-10-CM | POA: Diagnosis not present

## 2023-12-30 DIAGNOSIS — R1013 Epigastric pain: Secondary | ICD-10-CM | POA: Diagnosis not present

## 2023-12-30 DIAGNOSIS — K449 Diaphragmatic hernia without obstruction or gangrene: Secondary | ICD-10-CM | POA: Diagnosis not present

## 2023-12-30 DIAGNOSIS — R12 Heartburn: Secondary | ICD-10-CM | POA: Diagnosis not present

## 2023-12-30 DIAGNOSIS — R1110 Vomiting, unspecified: Secondary | ICD-10-CM | POA: Diagnosis not present

## 2023-12-31 ENCOUNTER — Other Ambulatory Visit (HOSPITAL_COMMUNITY): Payer: Self-pay | Admitting: General Surgery

## 2023-12-31 ENCOUNTER — Other Ambulatory Visit: Payer: Self-pay

## 2023-12-31 DIAGNOSIS — R1013 Epigastric pain: Secondary | ICD-10-CM

## 2023-12-31 DIAGNOSIS — I10 Essential (primary) hypertension: Secondary | ICD-10-CM

## 2024-01-04 NOTE — Progress Notes (Unsigned)
 [facility] [phone]   [phone]  PROGRESS NOTE  Patient Care Team: Cook, Jayce G, DO as PCP - General (Family Medicine) Debera Jayson MATSU, MD as PCP - Cardiology (Cardiology) Center, [facility] as Attending Physician Waddell Danelle ORN, MD as Consulting Physician (Cardiology) Harrie Agent, MD as Consulting Physician (Ophthalmology) Debera Jayson MATSU, MD as Consulting Physician (Cardiology) Court Dorn PARAS, MD as Consulting Physician (Cardiology) Olegario Messier, MD as Referring Physician (Obstetrics and Gynecology) Eartha Flavors, Toribio, MD as Consulting Physician (Gastroenterology)  Hematological/Oncological History # Splenic Marginal Zone Lymphoma 04/10/2023: last visit with Dr. Katragadda at [facility] 10/02/2023: establish care with Dr. Federico. Flow cytometry confirms monoclonal B cell population, consistent with splenic marginal zone lymphoma 11/14/2023: C1D1 of Rituximab  monotherapy.   Interval History:  Amy [name] 78 y.o. female with medical history significant for newly diagnosed splenic marginal zone lymphoma who presents for a follow up visit.  She presents today for cycle 4 day 1 of rituximab  monotherapy.  On exam today Amy [name] reports having ongoing fatigue which can impact her ADLs.  She has a good appetite without any dietary restrictions.  She had 1 episode of vomiting after eating greasy foods  She denies any bowel habit changes including recurrent episodes of diarrhea or constipation.  She denies easy bruising or signs of active bleeding.  Patient still has tremors the day of treatment which resolves on its own.  She experienced sweats after the last cycle for one day.  She denies fevers, chills, shortness of breath, chest pain or cough.  She has no other complaints.  Rest of the 10 point ROS as below.  MEDICAL HISTORY:  Past Medical History:  Diagnosis Date   Asthma    CAD (coronary artery disease)    60-70%  proximal LAD/diagonal bifurcational disease October 2015 at [facility] status DES x 2 to LAD/diagonal October 2015   Essential hypertension    Heart murmur    Hyperlipidemia    Hypothyroidism    PONV (postoperative nausea and vomiting)    Presence of permanent cardiac pacemaker    Sinus node dysfunction (HCC)    Biotronik pacemaker in place   Spleen enlarged     SURGICAL HISTORY: Past Surgical History:  Procedure Laterality Date   ABDOMINAL HYSTERECTOMY     ANAL FISSURE REPAIR     CARDIAC CATHETERIZATION     CATARACT EXTRACTION W/PHACO Right 10/17/2014   Procedure: CATARACT EXTRACTION PHACO AND INTRAOCULAR LENS PLACEMENT (IOC);  Surgeon: Cherene Mania, MD;  Location: [facility] ORS;  Service: Ophthalmology;  Laterality: Right;  CDE: 4.88   CATARACT EXTRACTION W/PHACO Left 10/27/2014   Procedure: CATARACT EXTRACTION PHACO AND INTRAOCULAR LENS PLACEMENT (IOC);  Surgeon: Cherene Mania, MD;  Location: [facility];  Service: Ophthalmology;  Laterality: Left;  CDE:7.86   COLONOSCOPY N/A 04/16/2018   Procedure: COLONOSCOPY;  Surgeon: Golda Claudis PENNER, MD;  Location: [facility] END[facility] SUITE;  Service: Endoscopy;  Laterality: N/A;  830   ESOPHAGOGASTRODUODENOSCOPY N/A 09/30/2023   Procedure: EGD (ESOPHAGOGASTRODUODENOSCOPY);  Surgeon: Eartha Flavors, Toribio, MD;  Location: [facility];  Service: Gastroenterology;  Laterality: N/A;  10:15 am, asa 1-2   FINGER SURGERY     INSERT / REPLACE / REMOVE PACEMAKER     NASAL SINUS SURGERY     PACEMAKER IMPLANT N/A 07/30/2016   Procedure: Pacemaker Implant Dual Chamber;  Surgeon: Waddell Danelle ORN, MD;  Location: [facility] INVASIVE CV LAB;  Service: Cardiovascular;  Laterality: N/A;   TONSILLECTOMY  SOCIAL HISTORY: Social History   Socioeconomic History   Marital status: Widowed    Spouse name: Not on file   Number of children: 1   Years of education: Not on file   Highest education level: 12th grade  Occupational History   Not on file  Tobacco Use   Smoking status: Never    Smokeless tobacco: Never  Vaping Use   Vaping status: Never Used  Substance and Sexual Activity   Alcohol  use: No   Drug use: No   Sexual activity: Not Currently    Birth control/protection: None  Other Topics Concern   Not on file  Social History Narrative   Not on file   Social Drivers of Health   Financial Resource Strain: Low Risk  (10/09/2023)   Overall Financial Resource Strain (CARDIA)    Difficulty of Paying Living Expenses: Not hard at all  Food Insecurity: No Food Insecurity (10/09/2023)   Hunger Vital Sign    Worried About Running Out of Food in the Last Year: Never true    Ran Out of Food in the Last Year: Never true  Transportation Needs: No Transportation Needs (10/09/2023)   PRAPARE - Administrator, Civil Service (Medical): No    Lack of Transportation (Non-Medical): No  Physical Activity: Insufficiently Active (10/09/2023)   Exercise Vital Sign    Days of Exercise per Week: 2 days    Minutes of Exercise per Session: 30 min  Stress: No Stress Concern Present (10/09/2023)   [name] of Occupational Health - Occupational Stress Questionnaire    Feeling of Stress: Not at all  Social Connections: Moderately Integrated (10/09/2023)   Social Connection and Isolation Panel    Frequency of Communication with Friends and Family: More than three times a week    Frequency of Social Gatherings with Friends and Family: More than three times a week    Attends Religious Services: More than 4 times per year    Active Member of [employer] or Organizations: Yes    Attends Banker Meetings: More than 4 times per year    Marital Status: Widowed  Intimate Partner Violence: Not At Risk (10/02/2023)   Humiliation, Afraid, Rape, and Kick questionnaire    Fear of Current or Ex-Partner: No    Emotionally Abused: No    Physically Abused: No    Sexually Abused: No    FAMILY HISTORY: Family History  Problem Relation Age of Onset   Heart disease Mother     Parkinson's disease Mother    Heart disease Father     ALLERGIES:  is allergic to truxima  [rituximab ] and sulfa antibiotics.  MEDICATIONS:  Current Outpatient Medications  Medication Sig Dispense Refill   amLODipine  (NORVASC ) 10 MG tablet TAKE 1 TABLET BY MOUTH EVERY EVENING 90 tablet 3   aspirin  81 MG tablet Take 81 mg by mouth daily.     atorvastatin (LIPITOR) 20 MG tablet TAKE 1 TABLET BY MOUTH EVERY DAY FOR CHOLESTEROL 90 tablet 3   betamethasone valerate ointment (VALISONE) 0.1 % APPLY TO THE AFFECTED AREA(S) TWICE DAILY (Patient taking differently: Apply topically daily. to affected area(s)) 45 g 2   calcium carbonate (OSCAL) 1500 (600 Ca) MG TABS tablet Take 1,500 mg by mouth daily with breakfast.     Cholecalciferol  (VITAMIN D3) 2000 units capsule Take 2,000 Units by mouth daily at 12 noon.      clobetasol  (TEMOVATE ) 0.05 % external solution Apply 1 application  topically as needed (psoriasis).  clopidogrel  (PLAVIX ) 75 MG tablet TAKE 1 TABLET BY MOUTH DAILY 90 tablet 3   cycloSPORINE (RESTASIS) 0.05 % ophthalmic emulsion 1 drop 2 (two) times daily.     hydrochlorothiazide (HYDRODIURIL) 12.5 MG tablet TAKE 1 TABLET BY MOUTH EVERY DAY HIGH BLOOD PRESSURE 90 tablet 3   isosorbide  mononitrate (IMDUR ) 60 MG 24 hr tablet TAKE 1 TABLET BY MOUTH DAILY 30 tablet 2   levothyroxine  (SYNTHROID ) 75 MCG tablet TAKE 1 TABLET BY MOUTH EVERY MORNING 90 tablet 3   magnesium  oxide (MAG-OX) 400 (240 Mg) MG tablet TAKE 1/2 TABLET BY MOUTH DAILY 45 tablet 3   Multiple Vitamin (MULTIVITAMIN WITH MINERALS) TABS tablet Take 1 tablet by mouth daily at 12 noon.      nystatin cream (MYCOSTATIN) Apply 1 Application topically 2 (two) times daily.     ondansetron  (ZOFRAN -ODT) 4 MG disintegrating tablet Take 1 tablet (4 mg total) by mouth every 8 (eight) hours as needed for nausea or vomiting. 60 tablet 3   pantoprazole  (PROTONIX ) 40 MG tablet Take 1 tablet (40 mg total) by mouth 2 (two) times daily before a  meal. 180 tablet 1   triamcinolone ointment (KENALOG) 0.5 % Apply 1 Application topically daily at 6 (six) AM.     valsartan  (DIOVAN ) 160 MG tablet Take 1 tablet (160 mg total) by mouth daily. 90 tablet 1   No current facility-administered medications for this visit.    REVIEW OF SYSTEMS:   Constitutional: ( - ) fevers, ( - )  chills , ( - ) night sweats Eyes: ( - ) blurriness of vision, ( - ) double vision, ( - ) watery eyes Ears, nose, mouth, throat, and face: ( - ) mucositis, ( - ) sore throat Respiratory: ( - ) cough, ( - ) dyspnea, ( - ) wheezes Cardiovascular: ( - ) palpitation, ( - ) chest discomfort, ( - ) lower extremity swelling Gastrointestinal:  ( - ) nausea, ( - ) heartburn, ( - ) change in bowel habits Skin: ( - ) abnormal skin rashes Lymphatics: ( - ) new lymphadenopathy, ( - ) easy bruising Neurological: ( - ) numbness, ( - ) tingling, ( - ) new weaknesses Behavioral/Psych: ( - ) mood change, ( - ) new changes  All other systems were reviewed with the patient and are negative.  PHYSICAL EXAMINATION: ECOG PERFORMANCE STATUS: 1 - Symptomatic but completely ambulatory  There were no vitals filed for this visit.  There were no vitals filed for this visit.   GENERAL: Well-appearing elderly Caucasian female, alert, no distress and comfortable SKIN: skin color, texture, turgor are normal, no rashes or significant lesions EYES: conjunctiva are pink and non-injected, sclera clear LUNGS: clear to auscultation and percussion with normal breathing effort HEART: regular rate & rhythm and no murmurs and no lower extremity edema Musculoskeletal: no cyanosis of digits and no clubbing  PSYCH: alert & oriented x 3, fluent speech NEURO: no focal motor/sensory deficits  LABORATORY DATA:  I have reviewed the data as listed    Latest Ref Rng & Units 12/05/2023    7:32 AM 11/28/2023    9:17 AM 11/21/2023   10:19 AM  CBC  WBC 4.0 - 10.5 K/uL 7.3  10.1  19.2   Hemoglobin 12.0 -  15.0 g/dL 87.4  86.3  85.7   Hematocrit 36.0 - 46.0 % 34.0  36.6  39.8   Platelets 150 - 400 K/uL 184  189  154        Latest  Ref Rng & Units 12/05/2023    7:32 AM 11/28/2023    9:17 AM 11/21/2023   10:19 AM  CMP  Glucose 70 - 99 mg/dL 893  99  83   BUN 8 - 23 mg/dL 23  22  24    Creatinine 0.44 - 1.00 mg/dL 9.03  9.01  8.99   Sodium 135 - 145 mmol/L 131  128  128   Potassium 3.5 - 5.1 mmol/L 4.3  4.2  4.7   Chloride 98 - 111 mmol/L 97  94  94   CO2 22 - 32 mmol/L 29  26  29    Calcium 8.9 - 10.3 mg/dL 9.2  9.3  9.7   Total Protein 6.5 - 8.1 g/dL 6.2  6.4  6.6   Total Bilirubin 0.0 - 1.2 mg/dL 2.0  2.3  1.2   Alkaline Phos 38 - 126 U/L 97  108  119   AST 15 - 41 U/L 15  17  24    ALT 0 - 44 U/L 16  18  30      RADIOGRAPHIC STUDIES: VAS US  RENAL ARTERY DUPLEX Result Date: 12/15/2023 ABDOMINAL VISCERAL Patient Name:  Amy [name]  Date of Exam:   12/15/2023 Medical Rec #: [id number]      Accession #:    [id number] Date of Birth: September 11, 1945       Patient Gender: F Patient Age:   3 years Exam Location:  [address] Procedure:      VAS US  RENAL ARTERY DUPLEX Referring Phys: [id number] JONATHAN J BERRY -------------------------------------------------------------------------------- Indications: Follow-up to bilateral renal arterial stenosis. Patient denies              flank pain. History of controlled hypertension. High Risk Factors: Hypertension, hyperlipidemia, no history of smoking, coronary                    artery disease. Other Factors: Known unexplained splenomegaly. Limitations: Air/bowel gas. Comparison Study: Prior renal arterial duplex exam on 11/28/2023 showed highest                   velocities in right renal artery 377 cm/s and left renal                   artery 328 cm/s. Performing Technologist: Annabella Cater RVT, RDCS (AE), RDMS  Examination Guidelines: A complete evaluation includes B-mode imaging, spectral Doppler, color Doppler, and power Doppler as needed of all accessible  portions of each vessel. Bilateral testing is considered an integral part of a complete examination. Limited examinations for reoccurring indications may be performed as noted.  Duplex Findings: +----------------------+--------+--------+------+--------+ Mesenteric            PSV cm/sEDV cm/sPlaqueComments +----------------------+--------+--------+------+--------+ Aorta Prox              125                          +----------------------+--------+--------+------+--------+ Aorta Distal            156                          +----------------------+--------+--------+------+--------+ Celiac Artery Proximal  166                          +----------------------+--------+--------+------+--------+ SMA Proximal            262                          +----------------------+--------+--------+------+--------+    +------------------+--------+--------+-------------------------------+  Right Renal ArteryPSV cm/sEDV cm/s            Comment             +------------------+--------+--------+-------------------------------+ Origin              367      44                                   +------------------+--------+--------+-------------------------------+ Proximal                          not seen due to overlying bowel +------------------+--------+--------+-------------------------------+ Mid                 117      17                                   +------------------+--------+--------+-------------------------------+ Distal               63      8                                    +------------------+--------+--------+-------------------------------+ +-----------------+--------+--------+-------------------------------+ Left Renal ArteryPSV cm/sEDV cm/s            Comment             +-----------------+--------+--------+-------------------------------+ Origin             272      23                                    +-----------------+--------+--------+-------------------------------+ Proximal                         not seen due to overlying bowel +-----------------+--------+--------+-------------------------------+ Mid                 50      9                                    +-----------------+--------+--------+-------------------------------+ Distal              62      7                                    +-----------------+--------+--------+-------------------------------+ +------------+--------+--------+----+-----------+--------+--------+----+ Right KidneyPSV cm/sEDV cm/sRI  Left KidneyPSV cm/sEDV cm/sRI   +------------+--------+--------+----+-----------+--------+--------+----+ Upper Pole  40      6       0.85Upper Pole 31      6       0.82 +------------+--------+--------+----+-----------+--------+--------+----+ Mid         36      5       0.        37      6       0.85 +------------+--------+--------+----+-----------+--------+--------+----+ Lower Pole  24      4       0.82Lower Pole 52      7       0.86 +------------+--------+--------+----+-----------+--------+--------+----+ Hilar  43      7       0.84Hilar      39      6       0.85 +------------+--------+--------+----+-----------+--------+--------+----+ +------------------+-------+------------------+--------+ Right Kidney             Left Kidney                +------------------+-------+------------------+--------+ RAR                      RAR                        +------------------+-------+------------------+--------+ RAR (manual)      2.9    RAR (manual)      2.2      +------------------+-------+------------------+--------+ Cortex                   Cortex                     +------------------+-------+------------------+--------+ Cortex thickness  9.00 mmCorex thickness   11.00 mm +------------------+-------+------------------+--------+ Kidney length (cm)9.9     Kidney length (cm)10.3     +------------------+-------+------------------+--------+  Summary: Largest Aortic Diameter: 2.0 cm  Renal:  Right: Moderate to severe atherosclerosis noted in the ostium. Can        not rule out higher grade stenosis within. Normal size right        kidney. Abnormal right Resistive Index. Normal cortical        thickness of right kidney. RRV flow present. 1-59% stenosis        of the right renal artery. Technically limited exam due to        overlying bowel gas. If clinically indicated, consider        alternative imaging to evaluate renal artery. Left:  1-59% stenosis of the left renal artery. Normal size of left        kidney. Abnormal left Resisitve Index. Normal cortical        thickness of the left kidney. LRV flow present. Technically        limited exam due to overlying bowel gas. If clinically        indicated, consider alternative imaging to evaluate renal        artery. Mesenteric: Normal Celiac artery and Superior Mesenteric artery findings. Severe atherosclerosis noted throughout.  *See table(s) above for measurements and observations.  Diagnosing physician: Dorn Lesches MD  Electronically signed by Dorn Lesches MD on 12/15/2023 at 1:38:50 PM.    Final    VAS US  CAROTID Result Date: 12/15/2023 Carotid Arterial Duplex Study Patient Name:  Amy [name]  Date of Exam:   12/15/2023 Medical Rec #: [id number]      Accession #:    [id number] Date of Birth: December 31, 1945       Patient Gender: F Patient Age:   57 years Exam Location:  [address] Procedure:      VAS US  CAROTID Referring Phys: JONATHAN BERRY --------------------------------------------------------------------------------  Indications:       Bilateral carotid artery stenosis. Risk Factors:      Hypertension, hyperlipidemia, no history of smoking, coronary                    artery disease. Comparison Study:  12/25/22                    Right Carotid: Velocities in the right ICA are consistent  with a 1-39%                    stenosis.                     Left Carotid: Velocities in the left ICA are consistent with                    a 1-39%                    stenosis.                     Vertebrals: Bilateral vertebral arteries demonstrate                    antegrade flow.                    Subclavians: Normal flow hemodynamics were seen in bilateral                    subclavian arteries. Performing Technologist: Dena Pane  Examination Guidelines: A complete evaluation includes B-mode imaging, spectral Doppler, color Doppler, and power Doppler as needed of all accessible portions of each vessel. Bilateral testing is considered an integral part of a complete examination. Limited examinations for reoccurring indications may be performed as noted.  Right Carotid Findings: +----------+--------+--------+--------+-------------------------+--------+           PSV cm/sEDV cm/sStenosisPlaque Description       Comments +----------+--------+--------+--------+-------------------------+--------+ CCA Prox  99      14                                                +----------+--------+--------+--------+-------------------------+--------+ CCA Distal95      15      <50%    heterogenous                      +----------+--------+--------+--------+-------------------------+--------+ ICA Prox  122     20      1-39%   heterogenous and calcific         +----------+--------+--------+--------+-------------------------+--------+ ICA Mid   138     26      1-39%                                     +----------+--------+--------+--------+-------------------------+--------+ ICA Distal118     19                                                +----------+--------+--------+--------+-------------------------+--------+ ECA       111     0               heterogenous                      +----------+--------+--------+--------+-------------------------+--------+  +----------+--------+-------+---------+-------------------+           PSV cm/sEDV cmsDescribe Arm Pressure (mmHG) +----------+--------+-------+---------+-------------------+ [id number]     0      [id number]                 +----------+--------+-------+---------+-------------------+ +---------+--------+--+--------+--+---------+ VertebralPSV  cm/s73EDV cm/s10Antegrade +---------+--------+--+--------+--+---------+  Left Carotid Findings: +----------+--------+--------+--------+------------------+--------+           PSV cm/sEDV cm/sStenosisPlaque DescriptionComments +----------+--------+--------+--------+------------------+--------+ CCA Prox  101     11                                         +----------+--------+--------+--------+------------------+--------+ CCA Distal85      11                                         +----------+--------+--------+--------+------------------+--------+ ICA Prox  201     21      1-39%   calcific                   +----------+--------+--------+--------+------------------+--------+ ICA Mid   115     23      1-39%                              +----------+--------+--------+--------+------------------+--------+ ICA Distal122     21      1-39%                              +----------+--------+--------+--------+------------------+--------+ ECA       106     0                                          +----------+--------+--------+--------+------------------+--------+ +----------+--------+--------+----------------+-------------------+           PSV cm/sEDV cm/sDescribe        Arm Pressure (mmHG) +----------+--------+--------+----------------+-------------------+ [id number]      0       Multiphasic, [id number]                 +----------+--------+--------+----------------+-------------------+ +---------+--------+--+--------+--+---------+ VertebralPSV cm/s76EDV cm/s13Antegrade  +---------+--------+--+--------+--+---------+   Summary: Right Carotid: Velocities in the right ICA are consistent with a 1-39% stenosis.                Non-hemodynamically significant plaque <50% noted in the CCA. The                ECA appears <50% stenosed. Left Carotid: Velocities in the left ICA are consistent with a 1-39% stenosis. Vertebrals:  Bilateral vertebral arteries demonstrate antegrade flow. Subclavians: Right subclavian artery flow was disturbed. Normal flow              hemodynamics were seen in the left subclavian artery. *See table(s) above for measurements and observations.  Electronically signed by Dorn Lesches MD on 12/15/2023 at 1:35:56 PM.    Final      ASSESSMENT & PLAN Amy [name] 78 y.o. female with medical history significant for newly diagnosed splenic marginal zone lymphoma who presents for a follow up visit.  # Splenic Marginal Zone Lymphoma-symptomatic  -- Patient has history of a splenomegaly which has been negative on PET CT scan.  Last imaging of the abdomen on 04/02/2023 showed worsening splenomegaly. -- Flow cytometry showed evidence of monoclonal B-cell population, findings most consistent with a splenic marginal zone lymphoma. --Patient meets the criteria for treatment based on symptomatic splenomegaly. -- Will start rituximab  monotherapy with rituximab  x  4 weeks weekly, followed by q. 8 weeks for 2 years. --Started Cycle 1, Day 1 of Rituximab  on 11/14/2023.  PLAN: --completed Cycle 4, Day 1 of Rituximab  today --Labs today show WBC ***, creatinine and LFTs are in range. --Proceed with treatment today as planned without any dose modifications. -- Return to clinic in 4 weeks with Dr. Federico  #Supportive Care -- chemotherapy education complete -- port placed -- allopurinol 300mg  PO daily for TLS prophylaxis -- no pain medication required at this time.    No orders of the defined types were placed in this encounter.   All questions were answered. The  patient knows to call the clinic with any problems, questions or concerns.   I have spent a total of 30 minutes minutes of face-to-face and non-face-to-face time, preparing to see the patient, performing a medically appropriate examination, counseling and educating the patient,  documenting clinical information in the electronic health record, independently interpreting results and communicating results to the patient, and care coordination.    Norleen IVAR Federico, MD Department of Hematology/Oncology [facility] Phone: [phone] Pager: [phone] Email: [email or web address]    01/04/2024 11:38 AM

## 2024-01-05 ENCOUNTER — Inpatient Hospital Stay: Attending: Hematology and Oncology

## 2024-01-05 ENCOUNTER — Inpatient Hospital Stay: Admitting: Hematology and Oncology

## 2024-01-05 VITALS — BP 144/53 | HR 81 | Temp 97.7°F | Resp 15 | Wt 139.0 lb

## 2024-01-05 DIAGNOSIS — E785 Hyperlipidemia, unspecified: Secondary | ICD-10-CM | POA: Diagnosis not present

## 2024-01-05 DIAGNOSIS — I10 Essential (primary) hypertension: Secondary | ICD-10-CM | POA: Insufficient documentation

## 2024-01-05 DIAGNOSIS — C8580 Other specified types of non-Hodgkin lymphoma, unspecified site: Secondary | ICD-10-CM | POA: Diagnosis not present

## 2024-01-05 DIAGNOSIS — I2510 Atherosclerotic heart disease of native coronary artery without angina pectoris: Secondary | ICD-10-CM | POA: Diagnosis not present

## 2024-01-05 DIAGNOSIS — Z79620 Long term (current) use of immunosuppressive biologic: Secondary | ICD-10-CM | POA: Diagnosis not present

## 2024-01-05 DIAGNOSIS — E039 Hypothyroidism, unspecified: Secondary | ICD-10-CM | POA: Insufficient documentation

## 2024-01-05 DIAGNOSIS — J45909 Unspecified asthma, uncomplicated: Secondary | ICD-10-CM | POA: Insufficient documentation

## 2024-01-05 DIAGNOSIS — R161 Splenomegaly, not elsewhere classified: Secondary | ICD-10-CM

## 2024-01-05 DIAGNOSIS — Z7982 Long term (current) use of aspirin: Secondary | ICD-10-CM | POA: Insufficient documentation

## 2024-01-05 DIAGNOSIS — C8307 Small cell B-cell lymphoma, spleen: Secondary | ICD-10-CM | POA: Diagnosis present

## 2024-01-05 DIAGNOSIS — Z79890 Hormone replacement therapy (postmenopausal): Secondary | ICD-10-CM | POA: Insufficient documentation

## 2024-01-05 DIAGNOSIS — Z7902 Long term (current) use of antithrombotics/antiplatelets: Secondary | ICD-10-CM | POA: Diagnosis not present

## 2024-01-05 DIAGNOSIS — Z79899 Other long term (current) drug therapy: Secondary | ICD-10-CM | POA: Insufficient documentation

## 2024-01-05 LAB — CMP (CANCER CENTER ONLY)
ALT: 19 U/L (ref 0–44)
AST: 23 U/L (ref 15–41)
Albumin: 4.2 g/dL (ref 3.5–5.0)
Alkaline Phosphatase: 95 U/L (ref 38–126)
Anion gap: 10 (ref 5–15)
BUN: 28 mg/dL — ABNORMAL HIGH (ref 8–23)
CO2: 26 mmol/L (ref 22–32)
Calcium: 9.2 mg/dL (ref 8.9–10.3)
Chloride: 103 mmol/L (ref 98–111)
Creatinine: 1.04 mg/dL — ABNORMAL HIGH (ref 0.44–1.00)
GFR, Estimated: 54 mL/min — ABNORMAL LOW (ref >60)
Glucose, Bld: 94 mg/dL (ref 70–99)
Potassium: 3.8 mmol/L (ref 3.5–5.1)
Sodium: 138 mmol/L (ref 135–145)
Total Bilirubin: 1.8 mg/dL — ABNORMAL HIGH (ref 0.0–1.2)
Total Protein: 6.4 g/dL — ABNORMAL LOW (ref 6.5–8.1)

## 2024-01-05 LAB — CBC WITH DIFFERENTIAL (CANCER CENTER ONLY)
Abs Immature Granulocytes: 0.01 K/uL (ref 0.00–0.07)
Basophils Absolute: 0 K/uL (ref 0.0–0.1)
Basophils Relative: 0 %
Eosinophils Absolute: 0.1 K/uL (ref 0.0–0.5)
Eosinophils Relative: 2 %
HCT: 30.3 % — ABNORMAL LOW (ref 36.0–46.0)
Hemoglobin: 10.8 g/dL — ABNORMAL LOW (ref 12.0–15.0)
Immature Granulocytes: 0 %
Lymphocytes Relative: 17 %
Lymphs Abs: 0.9 K/uL (ref 0.7–4.0)
MCH: 32.3 pg (ref 26.0–34.0)
MCHC: 35.6 g/dL (ref 30.0–36.0)
MCV: 90.7 fL (ref 80.0–100.0)
Monocytes Absolute: 0.5 K/uL (ref 0.1–1.0)
Monocytes Relative: 10 %
Neutro Abs: 3.7 K/uL (ref 1.7–7.7)
Neutrophils Relative %: 71 %
Platelet Count: 160 K/uL (ref 150–400)
RBC: 3.34 MIL/uL — ABNORMAL LOW (ref 3.87–5.11)
RDW: 14.5 % (ref 11.5–15.5)
WBC Count: 5.3 K/uL (ref 4.0–10.5)
nRBC: 0 % (ref 0.0–0.2)

## 2024-01-05 LAB — LACTATE DEHYDROGENASE: LDH: 182 U/L (ref 105–235)

## 2024-01-05 LAB — URIC ACID: Uric Acid, Serum: 3.7 mg/dL (ref 2.5–7.1)

## 2024-01-06 ENCOUNTER — Encounter (HOSPITAL_COMMUNITY)
Admission: RE | Admit: 2024-01-06 | Discharge: 2024-01-06 | Disposition: A | Source: Ambulatory Visit | Attending: General Surgery | Admitting: General Surgery

## 2024-01-06 DIAGNOSIS — R109 Unspecified abdominal pain: Secondary | ICD-10-CM | POA: Diagnosis not present

## 2024-01-06 DIAGNOSIS — R1013 Epigastric pain: Secondary | ICD-10-CM | POA: Diagnosis not present

## 2024-01-06 MED ORDER — STERILE WATER FOR INJECTION IJ SOLN
INTRAMUSCULAR | Status: AC
  Filled 2024-01-06: qty 10

## 2024-01-06 MED ORDER — TECHNETIUM TC 99M MEBROFENIN IV KIT
5.0000 | PACK | Freq: Once | INTRAVENOUS | Status: AC | PRN
  Administered 2024-01-06: 5 via INTRAVENOUS

## 2024-01-06 MED ORDER — SINCALIDE 5 MCG IJ SOLR
INTRAMUSCULAR | Status: AC
  Filled 2024-01-06: qty 5

## 2024-01-07 ENCOUNTER — Ambulatory Visit (HOSPITAL_COMMUNITY)
Admission: RE | Admit: 2024-01-07 | Discharge: 2024-01-07 | Disposition: A | Discharge status: Home / Self Care | Source: Ambulatory Visit | Attending: General Surgery | Admitting: General Surgery

## 2024-01-07 DIAGNOSIS — R1013 Epigastric pain: Secondary | ICD-10-CM | POA: Insufficient documentation

## 2024-01-07 DIAGNOSIS — K224 Dyskinesia of esophagus: Secondary | ICD-10-CM | POA: Diagnosis not present

## 2024-01-07 DIAGNOSIS — K449 Diaphragmatic hernia without obstruction or gangrene: Secondary | ICD-10-CM | POA: Diagnosis not present

## 2024-01-08 ENCOUNTER — Ambulatory Visit (INDEPENDENT_AMBULATORY_CARE_PROVIDER_SITE_OTHER): Admitting: Gastroenterology

## 2024-01-08 ENCOUNTER — Encounter (INDEPENDENT_AMBULATORY_CARE_PROVIDER_SITE_OTHER): Payer: Self-pay | Admitting: Gastroenterology

## 2024-01-08 VITALS — BP 138/56 | HR 78 | Temp 97.8°F | Ht 58.5 in | Wt 139.4 lb

## 2024-01-08 DIAGNOSIS — R112 Nausea with vomiting, unspecified: Secondary | ICD-10-CM

## 2024-01-08 DIAGNOSIS — R079 Chest pain, unspecified: Secondary | ICD-10-CM | POA: Insufficient documentation

## 2024-01-08 DIAGNOSIS — R110 Nausea: Secondary | ICD-10-CM

## 2024-01-08 DIAGNOSIS — R933 Abnormal findings on diagnostic imaging of other parts of digestive tract: Secondary | ICD-10-CM | POA: Insufficient documentation

## 2024-01-08 DIAGNOSIS — R0789 Other chest pain: Secondary | ICD-10-CM | POA: Diagnosis not present

## 2024-01-08 DIAGNOSIS — K219 Gastro-esophageal reflux disease without esophagitis: Secondary | ICD-10-CM

## 2024-01-08 DIAGNOSIS — R948 Abnormal results of function studies of other organs and systems: Secondary | ICD-10-CM | POA: Insufficient documentation

## 2024-01-08 MED ORDER — HYOSCYAMINE SULFATE 0.125 MG SL SUBL: 0.1250 mg | SUBLINGUAL_TABLET | Freq: Three times a day (TID) | SUBLINGUAL | 2 refills | Status: DC | PRN

## 2024-01-08 NOTE — Patient Instructions (Signed)
 Can take Levsin every 8 hours as needed for chest pain Continue pantoprazole  40 mg twice a day Continue Zofran  as needed for nausea every 8 hours Follow-up with Dr. Tanda regarding results of HIDA scan

## 2024-01-08 NOTE — Progress Notes (Signed)
 Amy [name], M.D. Gastroenterology & Hepatology [name] [address] [place], [place] [address]  Primary Care Physician: Amy [name] [address] [place] Amy [name] [place] [address]  I will communicate my assessment and recommendations to the referring MD via EMR.  Problems: Recurrent nausea with vomiting Chest pain Splenic marginal zone lymphoma on RTX   History of Present Illness: Amy [name] is a 78 y.o.  female with past medical history of Asthma, CAD, HTN, HLD, Hypothyroidism, sinus node dysfunction with pacemaker in place, GERD, osteopenia, vertigo, lichen sclerosis, splenic marginal zone lymphoma, psoriasis who presents for follow-up of nausea.  The patient was last seen on 10/13/2023. At that time, the patient   Was scheduled for EGD with Bravo and continue pantoprazole  40 mg to day, as well as Zofran  as needed for nausea.  States she has found out that food like gravy, corn, chocolate, spicy or pickles may cause nausea. States that if she takes Zofran , her nausea improves.  She feels some pressure in her middle chest most of the time. She reports that if she burps she feels better. She denies dysphagia or heartburn. She is currently taking this medication twice a day. She takes antacids frequently, as she feels better if burping.  The patient denies having any fever, chills, hematochezia, melena, hematemesis, abdominal distention, abdominal pain, diarrhea, jaundice, pruritus or weight loss.  Notably, the patient called to our office on 10/20/2023 asking for a referral to have surgical evaluation for hiatal hernia.  I had previously explained to her that she had a small hernia and there was no endoscopic abnormality confirming GERD.  However,  her daughter requested to have a surgical evaluation and canceled EGD with Bravo as they felt the symptoms were related to hiatal hernia.  Due to this surgical referral was sent.  The  the  patient saw Dr. Tanda on 12/30/2023 who consider the patient had a very small hiatal hernia that was not likely causing symptoms and she would not benefit from surgical management at this point.  He was advised to obtain an upper GI series and a HIDA scan.  HIDA scan performed 01/06/2024 showed patent cystic and common bile duct, with elevated gallbladder ejection fraction.  Had upper GI series yesterday that showed moderate esophageal dysmotility and small hiatal hernia.  RUQ US  11/2021 with enlarged spleen, no focal splenic lesions, normal GB, normal Bile ducts, liver with normal texture uniformly, portal vein unremarkable CT Abd with contrast 12/2021 with splenomegaly measuring 18cm in craniocaudal dimension, small hiatal hernia. Subcentimeter segment 4 hypodensity (2:8), too small to characterize.  Last EGD: 09/30/23 2 cm hiatal hernia and some erythema in the gastric antrum (reactive gastropathy, negative for H. pylori), normal duodenum.   Last Colonoscopy:04/16/18 diverticulosis, internal hemorrhoids   Past Medical History: Past Medical History:  Diagnosis Date   Asthma    CAD (coronary artery disease)    60-70% proximal LAD/diagonal bifurcational disease October 2015 at [name] status DES x 2 to LAD/diagonal October 2015   Essential hypertension    Heart murmur    Hyperlipidemia    Hypothyroidism    PONV (postoperative nausea and vomiting)    Presence of permanent cardiac pacemaker    Sinus node dysfunction (HCC)    Biotronik pacemaker in place   Spleen enlarged     Past Surgical History: Past Surgical History:  Procedure Laterality Date   ABDOMINAL HYSTERECTOMY     ANAL FISSURE REPAIR  CARDIAC CATHETERIZATION     CATARACT EXTRACTION W/PHACO Right 10/17/2014   Procedure: CATARACT EXTRACTION PHACO AND INTRAOCULAR LENS PLACEMENT (IOC);  Surgeon: Amy Mania, MD;  Location: [facility];  Service: Ophthalmology;  Laterality: Right;  CDE: 4.88   CATARACT EXTRACTION W/PHACO Left  10/27/2014   Procedure: CATARACT EXTRACTION PHACO AND INTRAOCULAR LENS PLACEMENT (IOC);  Surgeon: Amy Mania, MD;  Location: [facility];  Service: Ophthalmology;  Laterality: Left;  CDE:7.86   COLONOSCOPY N/A 04/16/2018   Procedure: COLONOSCOPY;  Surgeon: Amy Claudis PENNER, MD;  Location: [facility];  Service: Endoscopy;  Laterality: N/A;  830   ESOPHAGOGASTRODUODENOSCOPY N/A 09/30/2023   Procedure: EGD (ESOPHAGOGASTRODUODENOSCOPY);  Surgeon: Amy [name], Toribio, MD;  Location: [facility];  Service: Gastroenterology;  Laterality: N/A;  10:15 am, asa 1-2   FINGER SURGERY     INSERT / REPLACE / REMOVE PACEMAKER     NASAL SINUS SURGERY     PACEMAKER IMPLANT N/A 07/30/2016   Procedure: Pacemaker Implant Dual Chamber;  Surgeon: Amy Danelle ORN, MD;  Location: [facility] INVASIVE CV LAB;  Service: Cardiovascular;  Laterality: N/A;   TONSILLECTOMY      Family History: Family History  Problem Relation Age of Onset   Heart disease Mother    Parkinson's disease Mother    Heart disease Father     Social History: Social History   Tobacco Use  Smoking Status Never  Smokeless Tobacco Never   Social History   Substance and Sexual Activity  Alcohol  Use No   Social History   Substance and Sexual Activity  Drug Use No    Allergies: Allergies  Allergen Reactions   Truxima  [Rituximab ] Other (See Comments)    Patient c/o sudden sharp and achy lower back pain down to her hips. Pt stated that she could not stop yawning and that she felt like she needed to take a deep breaths with each yawn. VSS. Famotidine  and Solu-medrol  administered. Pt stated that she was feeling better. Tx was restarted. Remainder of tx was tolerated well. See note 11/14/23.    Sulfa Antibiotics Rash    Medications: Current Outpatient Medications  Medication Sig Dispense Refill   amLODipine  (NORVASC ) 10 MG tablet TAKE 1 TABLET BY MOUTH EVERY EVENING 90 tablet 3   aspirin  81 MG tablet Take 81 mg by mouth daily.      atorvastatin (LIPITOR) 20 MG tablet TAKE 1 TABLET BY MOUTH EVERY DAY FOR CHOLESTEROL 90 tablet 3   betamethasone valerate ointment (VALISONE) 0.1 % APPLY TO THE AFFECTED AREA(S) TWICE DAILY 45 [name] 2   calcium carbonate (OSCAL) 1500 (600 Ca) MG TABS tablet Take 1,500 mg by mouth daily with breakfast.     Cholecalciferol  (VITAMIN D3) 2000 units capsule Take 2,000 Units by mouth daily at 12 noon.      clobetasol  (TEMOVATE ) 0.05 % external solution Apply 1 application  topically as needed (psoriasis).     clopidogrel  (PLAVIX ) 75 MG tablet TAKE 1 TABLET BY MOUTH DAILY 90 tablet 3   cycloSPORINE (RESTASIS) 0.05 % ophthalmic emulsion 1 drop 2 (two) times daily.     hydrochlorothiazide (HYDRODIURIL) 12.5 MG tablet TAKE 1 TABLET BY MOUTH EVERY DAY HIGH BLOOD PRESSURE 90 tablet 3   isosorbide  mononitrate (IMDUR ) 60 MG 24 hr tablet TAKE 1 TABLET BY MOUTH DAILY 30 tablet 2   levothyroxine  (SYNTHROID ) 75 MCG tablet TAKE 1 TABLET BY MOUTH EVERY MORNING 90 tablet 3   magnesium  oxide (MAG-OX) 400 (240 Mg) MG tablet TAKE 1/2 TABLET BY MOUTH  DAILY 45 tablet 3   Multiple Vitamin (MULTIVITAMIN WITH MINERALS) TABS tablet Take 1 tablet by mouth daily at 12 noon.      nystatin cream (MYCOSTATIN) Apply 1 Application topically 2 (two) times daily.     ondansetron  (ZOFRAN -ODT) 4 MG disintegrating tablet Take 1 tablet (4 mg total) by mouth every 8 (eight) hours as needed for nausea or vomiting. 60 tablet 3   pantoprazole  (PROTONIX ) 40 MG tablet Take 1 tablet (40 mg total) by mouth 2 (two) times daily before a meal. 180 tablet 1   triamcinolone ointment (KENALOG) 0.5 % Apply 1 Application topically daily at 6 (six) AM.     valsartan  (DIOVAN ) 160 MG tablet Take 1 tablet (160 mg total) by mouth daily. 90 tablet 1   No current facility-administered medications for this visit.    Review of Systems: GENERAL: negative for malaise, night sweats HEENT: No changes in hearing or vision, no nose bleeds or other nasal problems. NECK:  Negative for lumps, goiter, pain and significant neck swelling RESPIRATORY: Negative for cough, wheezing CARDIOVASCULAR: Negative for chest pain, leg swelling, palpitations, orthopnea GI: SEE HPI MUSCULOSKELETAL: Negative for joint pain or swelling, back pain, and muscle pain. SKIN: Negative for lesions, rash PSYCH: Negative for sleep disturbance, mood disorder and recent psychosocial stressors. HEMATOLOGY Negative for prolonged bleeding, bruising easily, and swollen nodes. ENDOCRINE: Negative for cold or heat intolerance, polyuria, polydipsia and goiter. NEURO: negative for tremor, gait imbalance, syncope and seizures. The remainder of the review of systems is noncontributory.   Physical Exam: BP (!) 138/56 (BP Location: Left Arm, Patient Position: Sitting, Cuff Size: Normal)   Pulse 78   Temp 97.8 F (36.6 C) (Temporal)   Ht 4' 10.5 (1.486 m)   Wt 139 lb 6.4 oz (63.2 kg)   BMI 28.64 kg/m  GENERAL: The patient is AO x3, in no acute distress. HEENT: Head is normocephalic and atraumatic. EOMI are intact. Mouth is well hydrated and without lesions. NECK: Supple. No masses LUNGS: Clear to auscultation. No presence of rhonchi/wheezing/rales. Adequate chest expansion HEART: RRR, normal s1 and s2. ABDOMEN: Soft, nontender, no guarding, no peritoneal signs, and nondistended. BS +. No masses. EXTREMITIES: Without any cyanosis, clubbing, rash, lesions or edema. NEUROLOGIC: AOx3, no focal motor deficit. SKIN: no jaundice, no rashes  Imaging/Labs: as above  I personally reviewed and interpreted the available labs, imaging and endoscopic files.  Impression and Plan: Amy [name] is a 78 y.o.  female with past medical history of Asthma, CAD, HTN, HLD, Hypothyroidism, sinus node dysfunction with pacemaker in place, GERD, osteopenia, vertigo, lichen sclerosis, splenic marginal zone lymphoma, psoriasis who presents for follow-up of nausea.  Patient has presented persistent issues with nausea  without any red flag signs.  She has presented some improvement of her complaints with the use of Zofran .  Notably, had endoscopic and imaging investigations that have not shown any abnormalities explain her symptoms, which is reassuring.  In terms of her chest discomfort, she also has presented persistent discomfort even if she is not swallowing and has no clear-cut dysphagia symptoms.  Imaging recently was suggestive of dysmotility but she is able to swallow without any abnormalities.  It is possible she may have some spastic esophagus leading to her symptoms, for which we could evaluate this further with a manometry.  She would like to hold off on this.  For now, we will provide symptom relief with Levsin as needed.  She can continue using PPI twice daily for now.  -  Can take Levsin every 8 hours as needed for chest pain -Continue pantoprazole  40 mg twice a day -Continue Zofran  as needed for nausea every 8 hours -Follow-up with Dr. Tanda regarding results of HIDA scan  All questions were answered.      Amy Fortune, MD Gastroenterology and Hepatology [name]

## 2024-01-09 ENCOUNTER — Ambulatory Visit: Admitting: Family Medicine

## 2024-01-12 ENCOUNTER — Ambulatory Visit (HOSPITAL_COMMUNITY): Admission: RE | Admit: 2024-01-12 | Discharge: 2024-01-12 | Attending: Hematology and Oncology

## 2024-01-12 DIAGNOSIS — C8580 Other specified types of non-Hodgkin lymphoma, unspecified site: Secondary | ICD-10-CM | POA: Diagnosis not present

## 2024-01-12 MED ORDER — IOHEXOL 300 MG/ML  SOLN
100.0000 mL | Freq: Once | INTRAMUSCULAR | Status: AC | PRN
  Administered 2024-01-12: 100 mL via INTRAVENOUS

## 2024-01-12 MED ORDER — SODIUM CHLORIDE (PF) 0.9 % IJ SOLN
INTRAMUSCULAR | Status: AC
  Filled 2024-01-12: qty 50

## 2024-01-12 MED ORDER — IOHEXOL 9 MG/ML PO SOLN
1000.0000 mL | ORAL | Status: AC
  Administered 2024-01-12: 1000 mL via ORAL

## 2024-01-13 ENCOUNTER — Telehealth (INDEPENDENT_AMBULATORY_CARE_PROVIDER_SITE_OTHER): Payer: Self-pay | Admitting: Gastroenterology

## 2024-01-13 NOTE — Telephone Encounter (Signed)
 Spoke with the patient's daughter today, who stated that the patient wanted to hold off on further testing until she had her gallbladder removed.  She will give it a few weeks after removal and will let us  know if interested in pursuing gastric emptying study and eventual manometry.  RICK Locus. Thanks,  Toribio Fortune, MD Gastroenterology and Hepatology [facility]

## 2024-01-15 ENCOUNTER — Ambulatory Visit: Payer: Self-pay

## 2024-01-15 NOTE — Telephone Encounter (Signed)
-----   Message from Nurse Almarie DASEN, RN sent at 01/15/2024  8:41 AM EST -----  ----- Message ----- From: Federico Norleen DASEN MADISON, MD Sent: 01/14/2024   9:26 AM EST To: Almarie DELENA Arabia, RN  Please let Mr. Frommer know that her CT scan showed a decrease in the size of her spleen. It went to 12.7 cm, previously 13.9 cm. I anticipate it will continue to decrease in size over time, surgical  removal of the spleen should not be necessary. We will plan to see her back in 3 months to re-evaluate.  ----- Message ----- From: Interface, Rad Results In Sent: 01/13/2024   6:12 AM EST To: Norleen DASEN Federico MADISON, MD

## 2024-01-15 NOTE — Telephone Encounter (Signed)
 TC to pt and spoke with her daughter, Amy [name] regarding the results of her recent CT scan. Per Dr Federico, her CT scan showed a decrease in the size of her spleen. It went to 12.7 cm, previously 13.9 cm. I anticipate it will continue to decrease in size over time, surgical  removal of the spleen should not be necessary. We will plan to see her back in 3 months to re-evaluate.  Amy [name] verbalized understanding of results and confirmed the next appointment is on her calendar.

## 2024-01-16 ENCOUNTER — Ambulatory Visit: Admitting: Family Medicine

## 2024-01-16 ENCOUNTER — Encounter: Payer: Self-pay | Admitting: Family Medicine

## 2024-01-16 DIAGNOSIS — R053 Chronic cough: Secondary | ICD-10-CM | POA: Diagnosis not present

## 2024-01-16 DIAGNOSIS — I150 Renovascular hypertension: Secondary | ICD-10-CM

## 2024-01-16 DIAGNOSIS — R234 Changes in skin texture: Secondary | ICD-10-CM | POA: Diagnosis not present

## 2024-01-16 MED ORDER — MUPIROCIN 2 % EX OINT: 1.0000 | TOPICAL_OINTMENT | Freq: Two times a day (BID) | CUTANEOUS | 0 refills | Status: AC

## 2024-01-16 MED ORDER — GUAIFENESIN ER 600 MG PO TB12: 600.0000 mg | ORAL_TABLET | Freq: Two times a day (BID) | ORAL | 1 refills | Status: AC | PRN

## 2024-01-16 NOTE — Patient Instructions (Signed)
 Meds sent in.  Follow up in 6 months.  Merry Christmas  Dr. Bluford

## 2024-01-18 DIAGNOSIS — R053 Chronic cough: Secondary | ICD-10-CM | POA: Insufficient documentation

## 2024-01-18 DIAGNOSIS — R234 Changes in skin texture: Secondary | ICD-10-CM | POA: Insufficient documentation

## 2024-01-18 NOTE — Assessment & Plan Note (Signed)
Mucinex as directed.

## 2024-01-18 NOTE — Assessment & Plan Note (Signed)
 Underneath the right nostril. Avoid frequent wiping of the nose. Mupirocin  as directed.

## 2024-01-18 NOTE — Assessment & Plan Note (Signed)
 BP stable.

## 2024-01-18 NOTE — Progress Notes (Signed)
 Subjective:  Patient ID: Amy [name], female    DOB: [birth year]  Age: 78 y.o. MRN: [id number]  CC:   Chief Complaint  Patient presents with   Medical Management of Chronic Issues    3 month f/u  Several concerns: mainly eyes dropping causing trouble seeing and headaches, along with upcoming surgery.     HPI:  78 year old female presents for follow up.  Patient continues to have eye complaints. Is going to have surgery for ptosis. Follows with ophthalmology.  She is going to have cholecystectomy in January (Dr. Tanda).   Patient has sore underneath her nose that won't heal. She is constantly wiping her nose. This is troublesome to her.  Chronic postnasal drip and productive cough. Will discuss today.  Patient Active Problem List   Diagnosis Date Noted   Chronic cough 01/18/2024   Cracked skin 01/18/2024   Abnormal biliary HIDA scan 01/08/2024   Marginal zone lymphoma (HCC) 11/18/2023   Dry eye 10/12/2023   Renovascular hypertension 10/10/2023   Sinus node dysfunction (HCC) 10/10/2023   Iron  deficiency anemia 04/10/2023   Renal artery stenosis 03/19/2023   Aortic stenosis 01/27/2023   Carotid artery disease 12/17/2022   Coronary artery disease 07/17/2022   GERD (gastroesophageal reflux disease) 07/17/2022   Osteopenia 07/17/2022   Hyperlipidemia 07/17/2022   Hypothyroidism 07/17/2022   Lichen sclerosus 07/17/2022   Psoriasis 07/17/2022   Splenomegaly 07/16/2022   Seasonal and perennial allergic rhinitis 06/27/2022    Social Hx   Social History   Socioeconomic History   Marital status: Widowed    Spouse name: Not on file   Number of children: 1   Years of education: Not on file   Highest education level: 12th grade  Occupational History   Not on file  Tobacco Use   Smoking status: Never   Smokeless tobacco: Never  Vaping Use   Vaping status: Never Used  Substance and Sexual Activity   Alcohol  use: No   Drug use: No   Sexual activity: Not Currently     Birth control/protection: None  Other Topics Concern   Not on file  Social History Narrative   Not on file   Social Drivers of Health   Tobacco Use: Low Risk (01/16/2024)   Patient History    Smoking Tobacco Use: Never    Smokeless Tobacco Use: Never    Passive Exposure: Not on file  Financial Resource Strain: Low Risk (10/09/2023)   Overall Financial Resource Strain (CARDIA)    Difficulty of Paying Living Expenses: Not hard at all  Food Insecurity: No Food Insecurity (10/09/2023)   Epic    Worried About Radiation Protection Practitioner of Food in the Last Year: Never true    Ran Out of Food in the Last Year: Never true  Transportation Needs: No Transportation Needs (10/09/2023)   Epic    Lack of Transportation (Medical): No    Lack of Transportation (Non-Medical): No  Physical Activity: Insufficiently Active (10/09/2023)   Exercise Vital Sign    Days of Exercise per Week: 2 days    Minutes of Exercise per Session: 30 min  Stress: No Stress Concern Present (10/09/2023)   [name] of Occupational Health - Occupational Stress Questionnaire    Feeling of Stress: Not at all  Social Connections: Moderately Integrated (10/09/2023)   Social Connection and Isolation Panel    Frequency of Communication with Friends and Family: More than three times a week    Frequency of Social Gatherings with Friends  and Family: More than three times a week    Attends Religious Services: More than 4 times per year    Active Member of Clubs or Organizations: Yes    Attends Banker Meetings: More than 4 times per year    Marital Status: Widowed  Depression (PHQ2-9): Low Risk (01/16/2024)   Depression (PHQ2-9)    PHQ-2 Score: 4  Alcohol  Screen: Low Risk (08/20/2023)   Alcohol  Screen    Last Alcohol  Screening Score (AUDIT): 0  Housing: Unknown (12/30/2023)   Received from [facility]   Epic    Unable to Pay for Housing in the Last Year: Not on file    Number of Times Moved in the Last  Year: Not on file    At any time in the past 12 months, were you homeless or living in a shelter (including now)?: No  Utilities: Not At Risk (10/02/2023)   Epic    Threatened with loss of utilities: No  Health Literacy: Adequate Health Literacy (08/20/2023)   B1300 Health Literacy    Frequency of need for help with medical instructions: Never    Review of Systems Per HPI  Objective:  BP (!) 139/54   Pulse 61   Temp 97.7 F (36.5 C)   Ht 4' 10.5 (1.486 m)   Wt 135 lb 12.8 oz (61.6 kg)   SpO2 100%   BMI 27.90 kg/m      01/16/2024    9:19 AM 01/16/2024    8:58 AM 01/08/2024    2:11 PM  BP/Weight  Systolic BP 139 147 138  Diastolic BP 54 57 56  Wt. (Lbs)  135.8 139.4  BMI  27.9 kg/m2 28.64 kg/m2    Physical Exam Vitals and nursing note reviewed.  Constitutional:      General: She is not in acute distress.    Appearance: Normal appearance.  HENT:     Head: Normocephalic and atraumatic.     Nose:      Comments: Dry, cracked area at the labeled location. Mild erythema. Cardiovascular:     Rate and Rhythm: Normal rate and regular rhythm.  Pulmonary:     Effort: Pulmonary effort is normal.     Breath sounds: Normal breath sounds. No wheezing, rhonchi or rales.  Neurological:     Mental Status: She is alert.     Lab Results  Component Value Date   WBC 5.3 01/05/2024   HGB 10.8 (L) 01/05/2024   HCT 30.3 (L) 01/05/2024   PLT 160 01/05/2024   GLUCOSE 94 01/05/2024   CHOL 137 03/17/2023   TRIG 58 03/17/2023   HDL 67 03/17/2023   LDLCALC 58 03/17/2023   ALT 19 01/05/2024   AST 23 01/05/2024   NA 138 01/05/2024   K 3.8 01/05/2024   CL 103 01/05/2024   CREATININE 1.04 (H) 01/05/2024   BUN 28 (H) 01/05/2024   CO2 26 01/05/2024   TSH 1.610 03/17/2023   HGBA1C 5.2 03/17/2023     Assessment & Plan:  Chronic cough Assessment & Plan: Mucinex  as directed.  Orders: -     guaiFENesin  ER; Take 1 tablet (600 mg total) by mouth 2 (two) times daily as needed.   Dispense: 180 tablet; Refill: 1  Cracked skin Assessment & Plan: Underneath the right nostril. Avoid frequent wiping of the nose. Mupirocin  as directed.  Orders: -     Mupirocin ; Apply 1 Application topically 2 (two) times daily for 7 days.  Dispense: 30 g;  Refill: 0  Renovascular hypertension Assessment & Plan: BP stable.    Follow-up:  6 months  Nilson Tabora Bluford DO [facility]

## 2024-01-26 ENCOUNTER — Other Ambulatory Visit: Payer: Self-pay | Admitting: Family

## 2024-01-30 ENCOUNTER — Encounter

## 2024-02-02 ENCOUNTER — Telehealth (HOSPITAL_BASED_OUTPATIENT_CLINIC_OR_DEPARTMENT_OTHER): Payer: Self-pay | Admitting: *Deleted

## 2024-02-02 ENCOUNTER — Telehealth: Payer: Self-pay | Admitting: *Deleted

## 2024-02-02 NOTE — Telephone Encounter (Signed)
"  °  Patient Consent for Virtual Visit        Amy [name] has provided verbal consent on 02/02/2024 for a virtual visit (video or telephone).   CONSENT FOR VIRTUAL VISIT FOR:  Amy [name]  By participating in this virtual visit I agree to the following:  I hereby voluntarily request, consent and authorize [facility] and its employed or contracted physicians, physician assistants, nurse practitioners or other licensed health care professionals (the Practitioner), to provide me with telemedicine health care services (the Services) as deemed necessary by the treating Practitioner. I acknowledge and consent to receive the Services by the Practitioner via telemedicine. I understand that the telemedicine visit will involve communicating with the Practitioner through live audiovisual communication technology and the disclosure of certain medical information by electronic transmission. I acknowledge that I have been given the opportunity to request an in-person assessment or other available alternative prior to the telemedicine visit and am voluntarily participating in the telemedicine visit.  I understand that I have the right to withhold or withdraw my consent to the use of telemedicine in the course of my care at any time, without affecting my right to future care or treatment, and that the Practitioner or I may terminate the telemedicine visit at any time. I understand that I have the right to inspect all information obtained and/or recorded in the course of the telemedicine visit and may receive copies of available information for a reasonable fee.  I understand that some of the potential risks of receiving the Services via telemedicine include:  Delay or interruption in medical evaluation due to technological equipment failure or disruption; Information transmitted may not be sufficient (e.g. poor resolution of images) to allow for appropriate medical decision making by the Practitioner;  and/or  In rare instances, security protocols could fail, causing a breach of personal health information.  Furthermore, I acknowledge that it is my responsibility to provide information about my medical history, conditions and care that is complete and accurate to the best of my ability. I acknowledge that Practitioner's advice, recommendations, and/or decision may be based on factors not within their control, such as incomplete or inaccurate data provided by me or distortions of diagnostic images or specimens that may result from electronic transmissions. I understand that the practice of medicine is not an exact science and that Practitioner makes no warranties or guarantees regarding treatment outcomes. I acknowledge that a copy of this consent can be made available to me via my patient portal [facility] MyChart), or I can request a printed copy by calling the office of [facility].    I understand that my insurance will be billed for this visit.   I have read or had this consent read to me. I understand the contents of this consent, which adequately explains the benefits and risks of the Services being provided via telemedicine.  I have been provided ample opportunity to ask questions regarding this consent and the Services and have had my questions answered to my satisfaction. I give my informed consent for the services to be provided through the use of telemedicine in my medical care    "

## 2024-02-02 NOTE — Telephone Encounter (Signed)
" ° °  Name: Amy [name]  DOB: 11-01-1945  MRN: [id number]  Primary Cardiologist: Jayson Sierras, MD   Preoperative team, please contact this patient and set up a phone call appointment for further preoperative risk assessment. Please obtain consent and complete medication review. Thank you for your help.  I confirm that guidance regarding antiplatelet and oral anticoagulation therapy has been completed and, if necessary, noted below.  Per office protocol, if patient is without any new symptoms or concerns at the time of their virtual visit, she may hold Plavix  for 5 days prior to procedure. Please resume Plavix  as soon as possible postprocedure, at the discretion of the surgeon.    I also confirmed the patient resides in the state of [place] . As per [facility] Medical Board telemedicine laws, the patient must reside in the state in which the provider is licensed.   Lamarr Satterfield, NP 02/02/2024, 3:43 PM [facility]    "

## 2024-02-02 NOTE — Telephone Encounter (Signed)
 Preop tele appt scheduled, med rec and consent done.

## 2024-02-02 NOTE — Telephone Encounter (Signed)
"  ° °  Pre-operative Risk Assessment    Patient Name: Amy [name]  DOB: 1945-11-05 MRN: [id number]   Date of last office visit: 11/12/23 DR. MCDOWELL Date of next office visit: 04/27/24 DR. MCDOWELL   Request for Surgical Clearance    Procedure:  LAPAROSCOPIC CHOLECYSTECTOMY   Date of Surgery:  Clearance 02/27/24                                Surgeon:  DR. CAMELLIA BLUSH Surgeon's Group or Practice Name:  [facility] Phone number:  [phone] Fax number:  [phone] LEITA CORONA, CMA   Type of Clearance Requested:   - Medical  - Pharmacy:  Hold Clopidogrel  (Plavix )     Type of Anesthesia:  General    Additional requests/questions:    Bonney Niels Jest   02/02/2024, 3:20 PM  "

## 2024-02-09 ENCOUNTER — Encounter: Payer: Self-pay | Admitting: Nurse Practitioner

## 2024-02-09 ENCOUNTER — Ambulatory Visit (INDEPENDENT_AMBULATORY_CARE_PROVIDER_SITE_OTHER): Admitting: Nurse Practitioner

## 2024-02-09 VITALS — BP 155/71 | HR 68 | Temp 98.4°F | Ht 58.5 in | Wt 138.1 lb

## 2024-02-09 DIAGNOSIS — J111 Influenza due to unidentified influenza virus with other respiratory manifestations: Secondary | ICD-10-CM | POA: Diagnosis not present

## 2024-02-09 MED ORDER — PROMETHAZINE-DM 6.25-15 MG/5ML PO SYRP: ORAL_SOLUTION | ORAL | 0 refills | Status: DC

## 2024-02-09 MED ORDER — OSELTAMIVIR PHOSPHATE 75 MG PO CAPS: 75.0000 mg | ORAL_CAPSULE | Freq: Two times a day (BID) | ORAL | 0 refills | Status: DC

## 2024-02-09 NOTE — Progress Notes (Signed)
" ° °  Subjective:    Patient ID: [name], female    DOB: [birth year], 79 y.o.   MRN: [id number]  HPI Discussed the use of AI scribe software for clinical note transcription with the patient, who gave verbal consent to proceed.  History of Present Illness LESLEA VOWLES is a 79 year old female who presents with a deep cough, sore throat, and fever that began approximately 3 days ago.  She has been experiencing a deep cough primarily located in the throat, described as 'terrible' and causing discomfort. The cough is associated with a scratchy and sometimes burning sensation in the throat, along with the production of yellowish mucus. No nausea, vomiting, or diarrhea. No chest pain, SOB or wheezing.   She has frontal headaches that are persistent and occur alongside her other symptoms. She also notes a low-grade fever, particularly in the evenings, reaching up to 99.57F or 100F. The fever started the day after New Year's and has been intermittent since then.  No ear pain but there is itching in the ears.  She has been taking Mucinex  for her symptoms. She denies smoking or alcohol  use, reports drinking plenty of fluids, and has no issues with urination or bowel movements.       Objective:   Physical Exam NAD.  Alert, oriented.  TMs significant clear effusion on the left, on the right there is a small plug which appears to be dried blood.  No erythema or drainage noted.  Pharynx clear and moist.  Neck supple with minimal anterior cervical adenopathy.  Lungs clear.  No tachypnea.  Occasional nonproductive cough noted.  Heart regular rate rhythm. Today's Vitals   02/09/24 1503  BP: (!) 155/71  Pulse: 68  Temp: 98.4 F (36.9 C)  SpO2: 99%  Weight: 138 lb 2 oz (62.7 kg)  Height: 4' 10.5 (1.486 m)   Body mass index is 28.38 kg/m.       Assessment & Plan:  1. Influenza (Primary) Possible influenza, no rapid test available. Acute viral upper respiratory infection (possible  influenza) Symptoms and timing suggest viral etiology, likely influenza. Lungs clear, oxygen levels good. Slightly elevated blood pressure likely due to illness. Differential includes viral infection, influenza probable. - Prescribed Tamiflu  twice daily for five days. - Continue Mucinex  for mucus relief. - Provided nighttime cough syrup, cautioning about drowsiness. - Advised adequate fluid intake. - Instructed to monitor symptoms and call if symptoms worsen, such as chest pain or shortness of breath. - Advised to avoid contact with others while fever is present. - Consider antibiotics if symptoms persist by week's end.  - oseltamivir  (TAMIFLU ) 75 MG capsule; Take 1 capsule (75 mg total) by mouth 2 (two) times daily.  Dispense: 10 capsule; Refill: 0 - promethazine -dextromethorphan (PROMETHAZINE -DM) 6.25-15 MG/5ML syrup; Take 5 ml po every 4 hours at night prn cough. Drowsiness precautions.  Dispense: 118 mL; Refill: 0  Return if symptoms worsen or fail to improve.     "

## 2024-02-12 ENCOUNTER — Other Ambulatory Visit: Payer: Self-pay | Admitting: Family Medicine

## 2024-02-12 ENCOUNTER — Ambulatory Visit: Payer: Self-pay

## 2024-02-12 MED ORDER — AMOXICILLIN-POT CLAVULANATE 875-125 MG PO TABS: 1.0000 | ORAL_TABLET | Freq: Two times a day (BID) | ORAL | 0 refills | Status: DC

## 2024-02-12 NOTE — Telephone Encounter (Signed)
 FYI Only or Action Required?: Action required by provider: update on patient condition.  Patient was last seen in primary care on 02/09/2024 by Amy Elveria BROCKS, NP.  Called Nurse Triage reporting Cough.  Symptoms began about a week ago.  Interventions attempted: OTC medications: Mucinex  and Prescription medications: Tamiflu .  Symptoms are: unchanged.  Triage Disposition: Call PCP Now (overriding Home Care)  Patient/caregiver understands and will follow disposition?: Yes            Copied from CRM #[id number]. Topic: Clinical - Red Word Triage >> Feb 12, 2024 10:49 AM Olam RAMAN wrote: Red Word that prompted transfer to Nurse Triage: Pt stated color of phlemb has not gotten better and is more than last appt. Coughing as well and might need an antibiotic and pt advised to cb. Might need a zpack Reason for Disposition  Cough  Answer Assessment - Initial Assessment Questions Per provider's note, patient seen in office with Elveria on Monday 02/09/24 and states: consider antibiotics if symptoms persist by week's end. Patient states she has been on a zpack in the past for similar symptoms and it helped. She is very concerned because she has gallbladder surgery scheduled in 2 weeks and would like to be sure this is resolved before then.  1. ONSET: When did the cough begin?      Around 02/06/24.  2. SEVERITY: How bad is the cough today?      Increase in amount of sputum and green colored.   3. SPUTUM: Describe the color of your sputum (e.g., none, dry cough; clear, white, yellow, green)     Green.  4. HEMOPTYSIS: Are you coughing up any blood? If Yes, ask: How much? (e.g., flecks, streaks, tablespoons, etc.)     No. She states sometimes when blowing her nose, a time or 2 she  noticed a small amount/streak of blood in the mucous.  5. DIFFICULTY BREATHING: Are you having difficulty breathing? If Yes, ask: How bad is it? (e.g., mild, moderate, severe)      No.  6. FEVER:  Do you have a fever? If Yes, ask: What is your temperature, how was it measured, and when did it start?     No.  7. CARDIAC HISTORY: Do you have any history of heart disease? (e.g., heart attack, congestive heart failure)      Stents and pacemaker, CAD, bradycardia.  8. LUNG HISTORY: Do you have any history of lung disease?  (e.g., pulmonary embolus, asthma, emphysema)     No.  9. PE RISK FACTORS: Do you have a history of blood clots? (or: recent major surgery, recent prolonged travel, bedridden)     None per chart review.  10. OTHER SYMPTOMS: Do you have any other symptoms? (e.g., runny nose, wheezing, chest pain)       No. No SOB, fever (highest reading 98 degrees), chest pain, ear aches.  11. PREGNANCY: Is there any chance you are pregnant? When was your last menstrual period?       N/A.  Protocols used: Cough - Acute Productive-A-AH

## 2024-02-16 ENCOUNTER — Encounter: Payer: Self-pay | Admitting: Nurse Practitioner

## 2024-02-16 ENCOUNTER — Ambulatory Visit: Attending: Internal Medicine | Admitting: Nurse Practitioner

## 2024-02-16 DIAGNOSIS — Z01810 Encounter for preprocedural cardiovascular examination: Secondary | ICD-10-CM | POA: Diagnosis not present

## 2024-02-16 NOTE — Progress Notes (Signed)
 "   Virtual Visit via Telephone Note   Because of [name] co-morbid illnesses, she is at least at moderate risk for complications without adequate follow up.  This format is felt to be most appropriate for this patient at this time.  Due to technical limitations with video connection web designer), today's appointment will be conducted as an audio only telehealth visit, and [name] verbally agreed to proceed in this manner.   All issues noted in this document were discussed and addressed.  No physical exam could be performed with this format.  Evaluation Performed:  Preoperative cardiovascular risk assessment _____________   Date:  02/16/2024   Patient ID:  Amy [name], DOB 28-Jan-1946, MRN [id number] Patient Location:  Home Provider location:   Office  Primary Care Provider:  Bluford Jacqulyn MATSU, DO Primary Cardiologist:  Jayson Sierras, MD  Chief Complaint / Patient Profile   79 y.o. y/o female with a h/o sinus node dysfunction s/p PPM implant, HFpEF, hypertension, mild calcific aortic stenosis, severe bilateral renal artery stenosis, CAD s/p DES x 2 to LAD/diagonal bifurcation in 2015, cardiac PET CT January 2025 with no ischemia who is pending laparoscopic cholecystectomy and presents today for telephonic preoperative cardiovascular risk assessment.  History of Present Illness    Amy [name] is a 79 y.o. female who presents via audio/video conferencing for a telehealth visit today.  Pt was last seen in cardiology clinic on 11/12/23 by Dr. Sierras.  At that time [name] was doing well.  The patient is now pending procedure as outlined above. Since her last visit, she  denies chest pain, shortness of breath, lower extremity edema, fatigue, palpitations, melena, hematuria, hemoptysis, diaphoresis, weakness, presyncope, syncope, orthopnea, and PND. She remains active at home with house work and walking and does not have any concerning cardiac symptoms.   Past Medical History     Past Medical History:  Diagnosis Date   Allergy     Asthma    CAD (coronary artery disease)    60-70% proximal LAD/diagonal bifurcational disease October 2015 at [facility] status DES x 2 to LAD/diagonal October 2015   Essential hypertension    GERD (gastroesophageal reflux disease)    Heart murmur    Hyperlipidemia    Hypothyroidism    PONV (postoperative nausea and vomiting)    Presence of permanent cardiac pacemaker    Sinus node dysfunction (HCC)    Biotronik pacemaker in place   Spleen enlarged    Past Surgical History:  Procedure Laterality Date   ABDOMINAL HYSTERECTOMY     ANAL FISSURE REPAIR     CARDIAC CATHETERIZATION     CATARACT EXTRACTION W/PHACO Right 10/17/2014   Procedure: CATARACT EXTRACTION PHACO AND INTRAOCULAR LENS PLACEMENT (IOC);  Surgeon: Cherene Mania, MD;  Location: [facility];  Service: Ophthalmology;  Laterality: Right;  CDE: 4.88   CATARACT EXTRACTION W/PHACO Left 10/27/2014   Procedure: CATARACT EXTRACTION PHACO AND INTRAOCULAR LENS PLACEMENT (IOC);  Surgeon: Cherene Mania, MD;  Location: [facility];  Service: Ophthalmology;  Laterality: Left;  CDE:7.86   COLONOSCOPY N/A 04/16/2018   Procedure: COLONOSCOPY;  Surgeon: Golda Claudis PENNER, MD;  Location: [facility];  Service: Endoscopy;  Laterality: N/A;  830   CORONARY ARTERY BYPASS GRAFT     ESOPHAGOGASTRODUODENOSCOPY N/A 09/30/2023   Procedure: EGD (ESOPHAGOGASTRODUODENOSCOPY);  Surgeon: Eartha Flavors, Toribio, MD;  Location: [facility];  Service: Gastroenterology;  Laterality: N/A;  10:15 am, asa 1-2   EYE SURGERY     FINGER  SURGERY     INSERT / REPLACE / REMOVE PACEMAKER     NASAL SINUS SURGERY     PACEMAKER IMPLANT N/A 07/30/2016   Procedure: Pacemaker Implant Dual Chamber;  Surgeon: Waddell Danelle ORN, MD;  [facility]: [facility] INVASIVE CV LAB;  Service: Cardiovascular;  Laterality: N/A;   TONSILLECTOMY      Allergies  Allergies[1]  Home Medications    Prior to Admission medications  Medication Sig Start  Date End Date Taking? Authorizing Provider  amLODipine  (NORVASC ) 10 MG tablet TAKE 1 TABLET BY MOUTH EVERY EVENING 12/01/23   Debera Jayson MATSU, MD  amoxicillin -clavulanate (AUGMENTIN ) 875-125 MG tablet Take 1 tablet by mouth 2 (two) times daily. 02/12/24   Cook, Jayce G, DO  aspirin  81 MG tablet Take 81 mg by mouth daily.    [provider]  atorvastatin (LIPITOR) 20 MG tablet TAKE 1 TABLET BY MOUTH EVERY DAY FOR CHOLESTEROL 06/10/23   Dixon, Phillip E, MD  betamethasone valerate ointment (VALISONE) 0.1 % APPLY TO THE AFFECTED AREA(S) TWICE DAILY 10/22/22   Dixon, Phillip E, MD  calcium carbonate (OSCAL) 1500 (600 Ca) MG TABS tablet Take 1,500 mg by mouth daily with breakfast.    [provider]  Cholecalciferol  (VITAMIN D3) 2000 units capsule Take 2,000 Units by mouth daily at 12 noon.     [provider]  clobetasol  (TEMOVATE ) 0.05 % external solution Apply 1 application  topically as needed (psoriasis).    [provider]  clopidogrel  (PLAVIX ) 75 MG tablet TAKE 1 TABLET BY MOUTH DAILY 09/02/23   Debera Jayson MATSU, MD  cycloSPORINE (RESTASIS) 0.05 % ophthalmic emulsion 1 drop 2 (two) times daily. 04/01/23   [provider]  guaiFENesin  (MUCINEX ) 600 MG 12 hr tablet Take 1 tablet (600 mg total) by mouth 2 (two) times daily as needed. 01/16/24   Cook, Jayce G, DO  hydrochlorothiazide (HYDRODIURIL) 12.5 MG tablet TAKE 1 TABLET BY MOUTH EVERY DAY HIGH BLOOD PRESSURE 01/27/24   Debera Jayson MATSU, MD  hyoscyamine  (LEVSIN  SL) 0.125 MG SL tablet Place 1 tablet (0.125 mg total) under the tongue every 8 (eight) hours as needed (chest pain). 01/08/24   Castaneda Mayorga, Daniel, MD  isosorbide  mononitrate (IMDUR ) 60 MG 24 hr tablet TAKE 1 TABLET BY MOUTH DAILY 12/31/23   Bevely Doffing, FNP  levothyroxine  (SYNTHROID ) 75 MCG tablet TAKE 1 TABLET BY MOUTH EVERY MORNING 06/10/23   Melvenia Manus BRAVO, MD  magnesium  oxide (MAG-OX) 400 (240 Mg) MG tablet TAKE 1/2 TABLET BY MOUTH  DAILY 12/01/23   Debera Jayson MATSU, MD  Multiple Vitamin (MULTIVITAMIN WITH MINERALS) TABS tablet Take 1 tablet by mouth daily at 12 noon.     [provider]  nystatin cream (MYCOSTATIN) Apply 1 Application topically 2 (two) times daily.    [provider]  ondansetron  (ZOFRAN -ODT) 4 MG disintegrating tablet Take 1 tablet (4 mg total) by mouth every 8 (eight) hours as needed for nausea or vomiting. 08/21/23   Eartha Flavors, Toribio, MD  oseltamivir  (TAMIFLU ) 75 MG capsule Take 1 capsule (75 mg total) by mouth 2 (two) times daily. 02/09/24   Hoskins, Carolyn C, NP  pantoprazole  (PROTONIX ) 40 MG tablet Take 1 tablet (40 mg total) by mouth 2 (two) times daily before a meal. 10/10/23   Cook, Jayce G, DO  promethazine -dextromethorphan (PROMETHAZINE -DM) 6.25-15 MG/5ML syrup Take 5 ml po every 4 hours at night prn cough. Drowsiness precautions. 02/09/24   Hoskins, Carolyn C, NP  triamcinolone ointment (KENALOG) 0.5 % Apply 1  Application topically daily at 6 (six) AM. 06/24/23   [provider]  valsartan  (DIOVAN ) 160 MG tablet Take 1 tablet (160 mg total) by mouth daily. 11/10/23   Debera Jayson MATSU, MD    Physical Exam    Vital Signs:  [name] does not have vital signs available for review today.  Given telephonic nature of communication, physical exam is limited. AAOx3. NAD. Normal affect.  Speech and respirations are unlabored.  Accessory Clinical Findings    None  Assessment & Plan    1.  Preoperative Cardiovascular Risk Assessment: According to the Revised Cardiac Risk Index (RCRI), her Perioperative Risk of Major Cardiac Event is (%): 0.9. Her Functional Capacity in METs is: 4.4 according to the Duke Activity Status Index (DASI). The patient is doing well from a cardiac perspective. Therefore, based on ACC/AHA guidelines, the patient would be at acceptable risk for the planned procedure without further cardiovascular testing.   The patient was advised that if  she develops new symptoms prior to surgery to contact our office to arrange for a follow-up visit, and she verbalized understanding.  Per office protocol, she may hold Plavix  for 5 days prior to procedure and should resume as soon as hemodynamically stable postoperatively. Ideally aspirin  should be continued without interruption, however if the bleeding risk is too great, aspirin  may be held for 5-7 days prior to surgery. Please resume aspirin  post operatively when it is felt to be safe from a bleeding standpoint.   A copy of this note will be routed to requesting surgeon.  Time:   Today, I have spent 10 minutes with the patient with telehealth technology discussing medical history, symptoms, and management plan.     Rosaline EMERSON Bane, NP-C  02/16/2024, 10:29 AM [address], [address] [place], [place] [address] Office [phone] Fax [phone]      [1]  Allergies Allergen Reactions   Truxima  [Rituximab ] Other (See Comments)    Patient c/o sudden sharp and achy lower back pain down to her hips. Pt stated that she could not stop yawning and that she felt like she needed to take a deep breaths with each yawn. VSS. Famotidine  and Solu-medrol  administered. Pt stated that she was feeling better. Tx was restarted. Remainder of tx was tolerated well. See note 11/14/23.    Sulfa Antibiotics Rash and Dermatitis   "

## 2024-02-19 ENCOUNTER — Ambulatory Visit: Payer: Self-pay | Admitting: Family Medicine

## 2024-02-19 NOTE — Telephone Encounter (Signed)
" °  1st attempt  Reason for Triage: she just finished antibiotics for flu.. she thought probiotics would help w/antibiotics.SABRA started last night where she is burning all the time ( vaginal area )   "

## 2024-02-19 NOTE — Telephone Encounter (Signed)
 FYI Only or Action Required?: Action required by provider: Requesting med for vaginal burning.  Patient was last seen in primary care on 02/09/2024 by Mauro Elveria BROCKS, NP.  Called Nurse Triage reporting Vaginal Pain.  Symptoms began yesterday.  Interventions attempted: Prescription medications: Lichen sclerosus  medication.  Symptoms are: unchanged.  Triage Disposition: See Physician Within 24 Hours  Patient/caregiver understands and will follow disposition?: Yes  Reason for Disposition  [1] MILD to MODERATE vaginal pain AND [2] present > 24 hours  (Exception: Chronic pain.)  Answer Assessment - Initial Assessment Questions Dx Lichen sclerosus, used topical medication for that in vaginal area, but no relief. Pt states she thinks its the abx she took, but it has not happened before. Pt is requesting if something can be called in for these sx to [facility] on file. Please advise. CB [phone]  1. SYMPTOM: What's the main symptom you're concerned about? (e.g., pain, itching, dryness)     Vaginal burning, mild dysuria  2. ONSET: When did the burning start?     Last night  3. PAIN: Is there any pain? If Yes, ask: How bad is it? (Scale: 1-10; mild, moderate, severe)     4-5/10  4. ITCHING: Is there any itching? If Yes, ask: How bad is it? (Scale: 1-10; mild, moderate, severe)     Denies  5. CAUSE: What do you think is causing the discharge? Have you had the same problem before? What happened then?     amoxicillin -clavulanate (AUGMENTIN ) 875-125 MG table  6. OTHER SYMPTOMS: Do you have any other symptoms? (e.g., fever, itching, vaginal bleeding, pain with urination, injury to genital area, vaginal foreign body)     denies  Protocols used: Vaginal Symptoms-A-AH

## 2024-02-20 ENCOUNTER — Other Ambulatory Visit: Payer: Self-pay | Admitting: Nurse Practitioner

## 2024-02-20 ENCOUNTER — Ambulatory Visit: Payer: Self-pay | Admitting: *Deleted

## 2024-02-20 ENCOUNTER — Encounter (HOSPITAL_COMMUNITY): Admission: RE | Admit: 2024-02-20 | Source: Ambulatory Visit

## 2024-02-20 MED ORDER — FLUCONAZOLE 150 MG PO TABS: ORAL_TABLET | ORAL | 0 refills | Status: AC

## 2024-02-20 NOTE — Telephone Encounter (Signed)
 FYI Only or Action Required?: Action required by provider: clinical question for provider, update on patient condition, and requesting if medication needed or other recommendations.  Patient was last seen in primary care on 02/09/2024 by Amy Elveria BROCKS, NP.  Called Nurse Triage reporting Vaginal Pain.  Symptoms began yesterday.  Interventions attempted: OTC medications: probiotics and yogurt.  Symptoms are: gradually worsening.  Triage Disposition: See Physician Within 24 Hours  Patient/caregiver understands and will follow disposition?: No, wishes to speak with PCP   Patient requesting call back. Offered appt and patient has already been seen. Please advise.          Reason for Disposition  [1] MILD to MODERATE vaginal pain AND [2] present > 24 hours  (Exception: Chronic pain.)  Answer Assessment - Initial Assessment Questions Patient requesting if medication or cream may be needed for vaginal burning sensations that is mild but constant since completing antibiotics prescribed for Flu sx of cough. Patient completed augmentin  yesterday and sx started yesterday . Please advise patient does not want appt only wants PCP recommendations.       1. SYMPTOM: What's the main symptom you're concerned about? (e.g., pain, itching, dryness)     Constant burning sensation since completing antibiotics 2. LOCATION: Where is the  burning  located? (e.g., inside/outside, left/right)     Vaginal area 3. ONSET: When did the  burning sensation  start?     Yesterday  4. PAIN: Is there any pain? If Yes, ask: How bad is it? (Scale: 1-10; mild, moderate, severe)     Mild burning sensation 5. ITCHING: Is there any itching? If Yes, ask: How bad is it? (Scale: 1-10; mild, moderate, severe)     No  6. CAUSE: What do you think is causing the discharge? Have you had the same problem before? What happened then?     No discharge 7. OTHER SYMPTOMS: Do you have any other  symptoms? (e.g., fever, itching, vaginal bleeding, pain with urination, injury to genital area, vaginal foreign body)     Mild vaginal burning sensation after completing antibiotics yesterday. Mild  sensation of burning with urination but burning is constant. Patient has been taking probiotics while taking antibiotics. Reports sx different than the pain with lichen sclerosis. No fever no redness no discharge, no itching.  8. PREGNANCY: Is there any chance you are pregnant? When was your last menstrual period?     na  Protocols used: Vaginal Symptoms-A-AH

## 2024-02-20 NOTE — Telephone Encounter (Signed)
 Duplicate- see other message

## 2024-02-20 NOTE — Telephone Encounter (Signed)
Daughter (DPR) notified and verbalized understanding. 

## 2024-02-27 ENCOUNTER — Ambulatory Visit: Payer: Self-pay | Admitting: General Surgery

## 2024-02-27 DIAGNOSIS — R945 Abnormal results of liver function studies: Secondary | ICD-10-CM

## 2024-02-27 DIAGNOSIS — R161 Splenomegaly, not elsewhere classified: Secondary | ICD-10-CM

## 2024-02-27 DIAGNOSIS — R948 Abnormal results of function studies of other organs and systems: Secondary | ICD-10-CM

## 2024-03-08 ENCOUNTER — Encounter (HOSPITAL_COMMUNITY): Payer: Self-pay

## 2024-03-08 NOTE — Progress Notes (Signed)
 PCP - Jacqulyn Ahle, DO  Cardiologist - Jayson Sierras, MD preop eval  02-16-24 epic EP- Donnice DELENA Primus, MD  LOV 03-10-24 Daphne Scripture, NP preop eval Dr. Federico- LOV  01-05-24 epic  PPM/ICD -  Device Orders - on chart Rep Notified - no Last device check 11-01-23 epic  remote check   Chest x-ray - CT chest abd/pelvis 01-13-24 epic EKG - 08-12-23 epic Stress Test -  ECHO - 02-11-23 epic Cardiac Cath -  NM PET CT cardiac- 02-06-23 epic  Sleep Study - n/a CPAP - n/a  Fasting Blood Sugar - n/a Checks Blood Sugar _n/a____ times a day  Blood Thinner Instructions:Plavix   last dose  03-13-24 Aspirin  Instructions: 81 mg asa  ERAS Protcol -y PRE-SURGERY n/a   COVID vaccine -yes  Activity--Able to climb a flight of stairs with no CP or SOB slowly  Anesthesia review: CAD/stent, HTN, murmur, pacemaker, sinus node dysfunction, mild aortic stenosis,Splenic Marginal Zone Lymphoma   Patient denies shortness of breath, fever, cough and chest pain at PAT appointment   All instructions explained to the patient, with a verbal understanding of the material. Patient agrees to go over the instructions while at home for a better understanding. Patient also instructed to self quarantine after being tested for COVID-19. The opportunity to ask questions was provided.

## 2024-03-08 NOTE — Patient Instructions (Signed)
 SURGICAL WAITING ROOM VISITATION  Patients having surgery or a procedure may have no more than 2 support people in the waiting area - these visitors may rotate.    Children ages 26 and under will not be able to visit patients in [facility] under most circumstances.   Visitors with respiratory illnesses are discouraged from visiting and should remain at home.  If the patient needs to stay at the hospital during part of their recovery, the visitor guidelines for inpatient rooms apply. Pre-op nurse will coordinate an appropriate time for 1 support person to accompany patient in pre-op.  This support person may not rotate.    Please refer to the [facility] website for the visitor guidelines for Inpatients (after your surgery is over and you are in a regular room).       Your procedure is scheduled on: 03-19-24   Report to [facility] Main Entrance    Report to admitting at      0515   AM   Call this number if you have problems the morning of surgery [phone]   Do not eat food :After Midnight.   After Midnight you may have the following liquids until __    0430____ AM/ DAY OF SURGERY  then nothing by mouth  Water  Non-Citrus Juices (without pulp, NO RED-Apple, White grape, White cranberry) Black Coffee (NO MILK/CREAM OR CREAMERS, sugar ok)  Clear Tea (NO MILK/CREAM OR CREAMERS, sugar ok) regular and decaf                             Plain Jell-O (NO RED)                                           Fruit ices (not with fruit pulp, NO RED)                                     Popsicles (NO RED)                                                               Sports drinks like Gatorade (NO RED)                         If you have questions, please contact your surgeons office.   FOLLOW  ANY ADDITIONAL PRE OP INSTRUCTIONS YOU RECEIVED FROM YOUR SURGEON'S OFFICE!!!     Oral Hygiene is also important to reduce your risk of infection.                                     Remember - BRUSH YOUR TEETH THE MORNING OF SURGERY WITH YOUR REGULAR TOOTHPASTE  DENTURES WILL BE REMOVED PRIOR TO SURGERY PLEASE DO NOT APPLY Poly grip OR ADHESIVES!!!   Do NOT smoke after Midnight   Stop all vitamins and herbal supplements 7 days before surgery.   Take these medicines the morning of surgery with A  SIP OF WATER : pantoprazole , levothyroxine , isorbide mononitrate, mucinex  if needed, atorvastatin  DO NOT TAKE ANY ORAL DIABETIC MEDICATIONS DAY OF YOUR SURGERY  Bring CPAP mask and tubing day of surgery.                              You may not have any metal on your body including hair pins, jewelry, and body piercing             Do not wear make-up, lotions, powders, perfumes/cologne, or deodorant  Do not wear nail polish including gel and S&S, artificial/acrylic nails, or any other type of covering on natural nails including finger and toenails. If you have artificial nails, gel coating, etc. that needs to be removed by a nail salon please have this removed prior to surgery or surgery may need to be canceled/ delayed if the surgeon/ anesthesia feels like they are unable to be safely monitored.   Do not shave  48 hours prior to surgery.               Men may shave face and neck. Do not bring valuables to the hospital. [facility] IS NOT             RESPONSIBLE   FOR VALUABLES.   Contacts, glasses, dentures or bridgework may not be worn into surgery.   Bring small overnight bag day of surgery.   DO NOT BRING YOUR HOME MEDICATIONS TO THE HOSPITAL. PHARMACY WILL DISPENSE MEDICATIONS LISTED ON YOUR MEDICATION LIST TO YOU DURING YOUR ADMISSION IN THE HOSPITAL!    Patients discharged on the day of surgery will not be allowed to drive home.  Someone NEEDS to stay with you for the first 24 hours after anesthesia.   Special Instructions: Bring a copy of your healthcare power of attorney and living will documents the day of surgery if you haven't scanned them before.               Please read over the following fact sheets you were given: IF YOU HAVE QUESTIONS ABOUT YOUR PRE-OP INSTRUCTIONS PLEASE CALL [phone].   If you test positive for Covid or have been in contact with anyone that has tested positive in the last 10 days please notify you surgeon.    [facility] [facility] - Preparing for Surgery Before surgery, you can play an important role.  Because skin is not sterile, your skin needs to be as free of germs as possible.  You can reduce the number of germs on your skin by washing with CHG (chlorahexidine gluconate) soap before surgery.  CHG is an antiseptic cleaner which kills germs and bonds with the skin to continue killing germs even after washing. Please DO NOT use if you have an allergy  to CHG or antibacterial soaps.  If your skin becomes reddened/irritated stop using the CHG and inform your nurse when you arrive at Short Stay. Do not shave (including legs and underarms) for at least 48 hours prior to the first CHG shower.  You may shave your face/neck.  Please follow these instructions carefully:  1.  Shower with CHG Soap the night before surgery ONLY (DO NOT USE THE SOAP THE MORNING OF SURGERY).  2.  If you choose to wash your hair, wash your hair first as usual with your normal  shampoo.  3.  After you shampoo, rinse your hair and body thoroughly to remove the shampoo.  4.  Use CHG as you would any other liquid soap.  You can apply chg directly to the skin and wash.  Gently with a scrungie or clean washcloth.  5.  Apply the CHG Soap to your body ONLY FROM THE NECK DOWN.   Do  not use on face/ open                           Wound or open sores. Avoid contact with eyes, ears mouth and genitals (private parts).                       Wash face,  Genitals (private parts) with your normal soap.             6.  Wash thoroughly, paying special attention to the area where your  surgery  will be performed.  7.  Thoroughly rinse your body with warm  water  from the neck down.  8.  DO NOT shower/wash with your normal soap after using and rinsing off the CHG Soap.                9.  Pat yourself dry with a clean towel.            10.  Wear clean pajamas.            11.  Place clean sheets on your bed the night of your first shower and do not  sleep with pets. Day of Surgery : Do not apply any CHG, lotions/deodorants the morning of surgery.  Please wear clean clothes to the hospital/surgery center.  FAILURE TO FOLLOW THESE INSTRUCTIONS MAY RESULT IN THE CANCELLATION OF YOUR SURGERY  PATIENT SIGNATURE_________________________________  NURSE SIGNATURE__________________________________  ________________________________________________________________________

## 2024-03-09 NOTE — Progress Notes (Unsigned)
" °  Electrophysiology Office Note:   Date:  03/10/2024  ID:  JEROME OTTER, DOB 1945-03-04, MRN [id number]  Primary Cardiologist: Jayson Sierras, MD Primary Heart Failure: None Electrophysiologist: Donnice DELENA Primus, MD       History of Present Illness:   Amy [name] is a 79 y.o. female with h/o SND s/p PPM, CAD, HLD, AS, renal artery stenosis, hyperthyroidism, IDA, marginal zone lymphoma seen today for electrophysiology clearance for laparoscopic cholecystectomy.    Since last being seen in our clinic the patient reports she is anxious to get her gallbladder taken out. She states she had to delay recently as she had a viral illness. Her daughter accompanies her to the visit.  She reports no issues with her pacemaker.  She denies chest pain, palpitations, dyspnea, PND, orthopnea, nausea, vomiting, dizziness, syncope, edema, weight gain, or early satiety.  Review of systems complete and found to be negative unless listed in HPI.   EP Information / Studies Reviewed:    EKG is ordered today. Personal review as below.  EKG Interpretation Date/Time:  Wednesday March 10 2024 14:33:23 EST Ventricular Rate:  68 PR Interval:  160 QRS Duration:  70 QT Interval:  380 QTC Calculation: 404 R Axis:   20  Text Interpretation: Atrial-paced rhythm Confirmed by Aniceto Jarvis ([id number]) on 03/10/2024 2:42:59 PM   PPM Interrogation-  reviewed in detail today,  See PACEART report.  Arrhythmia/Device History: Biotronik Dual Chamber PPM implanted 07/30/16 for Sinus Node Dysfunction  Physical Exam:   VS:  BP (!) 138/54   Pulse 66   Ht 4' 10.5 (1.486 m)   Wt 137 lb (62.1 kg)   SpO2 98%   BMI 28.15 kg/m    Wt Readings from Last 3 Encounters:  03/10/24 137 lb (62.1 kg)  02/09/24 138 lb 2 oz (62.7 kg)  01/16/24 135 lb 12.8 oz (61.6 kg)     GEN: Well nourished, well developed in no acute distress NECK: No JVD; No carotid bruits CARDIAC: Regular rate and rhythm, no murmurs, rubs,  gallops RESPIRATORY:  Clear to auscultation without rales, wheezing or rhonchi  ABDOMEN: Soft, non-tender, non-distended EXTREMITIES:  No edema; No deformity   ASSESSMENT AND PLAN:    SND s/p Biotronik PPM  -Normal PPM function -See Pace Art report -No changes today  CAD s/p Stent  -no anginal symptoms    Pre-Operative Risk Assessment  -medical clearance completed 02/16/24  -device functioning normally, not dependent on interrogation today > underlying SB 40's -magnet should be available for procedure, placed over device during procedure and patient monitored on telemetry during case -device clinic aware of case and will send rec's for patients device during case   Disposition:   Follow up with Dr. Primus (pt wants to meet him) in 12 months  Signed, Jarvis Aniceto, NP-C, AGACNP-BC [facility] - Electrophysiology  03/10/2024, 4:58 PM  "

## 2024-03-10 ENCOUNTER — Ambulatory Visit: Admitting: Pulmonary Disease

## 2024-03-10 ENCOUNTER — Encounter: Payer: Self-pay | Admitting: Pulmonary Disease

## 2024-03-10 ENCOUNTER — Encounter: Payer: Self-pay | Admitting: Student in an Organized Health Care Education/Training Program

## 2024-03-10 VITALS — BP 138/54 | HR 66 | Ht 58.5 in | Wt 137.0 lb

## 2024-03-10 DIAGNOSIS — Z01810 Encounter for preprocedural cardiovascular examination: Secondary | ICD-10-CM

## 2024-03-10 DIAGNOSIS — Z950 Presence of cardiac pacemaker: Secondary | ICD-10-CM

## 2024-03-10 DIAGNOSIS — I495 Sick sinus syndrome: Secondary | ICD-10-CM | POA: Diagnosis not present

## 2024-03-10 LAB — CUP PACEART INCLINIC DEVICE CHECK
Date Time Interrogation Session: 20260204165631
Implantable Lead Connection Status: 753985
Implantable Lead Connection Status: 753985
Implantable Lead Implant Date: 20180626
Implantable Lead Implant Date: 20180626
Implantable Lead Location: 753859
Implantable Lead Location: 753860
Implantable Lead Model: 377
Implantable Lead Model: 377
Implantable Lead Serial Number: 49791993
Implantable Lead Serial Number: 49865965
Implantable Pulse Generator Implant Date: 20180626
Pulse Gen Model: 407145
Pulse Gen Serial Number: 69052081

## 2024-03-10 NOTE — Patient Instructions (Signed)
 Medication Instructions:  Your physician recommends that you continue on your current medications as directed. Please refer to the Current Medication list given to you today.  *If you need a refill on your cardiac medications before your next appointment, please call your pharmacy*  Lab Work: None ordered If you have labs (blood work) drawn today and your tests are completely normal, you will receive your results only by: MyChart Message (if you have MyChart) OR A paper copy in the mail If you have any lab test that is abnormal or we need to change your treatment, we will call you to review the results.  Follow-Up: At [facility], you and your health needs are our priority.  As part of our continuing mission to provide you with exceptional heart care, our providers are all part of one team.  This team includes your primary Cardiologist (physician) and Advanced Practice Providers or APPs (Physician Assistants and Nurse Practitioners) who all work together to provide you with the care you need, when you need it.  Your next appointment:   1 year(s)  Provider:   Donnice Primus, MD

## 2024-03-10 NOTE — Progress Notes (Signed)
 PERIOPERATIVE PRESCRIPTION FOR IMPLANTED CARDIAC DEVICE PROGRAMMING  Patient Information: Name:  Amy [name]  DOB:  September 25, 1945  MRN:  [id number]  Planned Procedure: Laparoscopic cholecystectomy  Surgeon: Dr. Camellia Blush  Date of Procedure:  03-19-24  Cautery will be used.  Position during surgery: n/a  Device Information:  Clinic EP Physician:  Dr. Donnice Primus  Device Type:  Pacemaker Manufacturer and Phone #:  Biotronik: [phone] Pacemaker Dependent?:  No. Date of Last Device Check:  03/10/2024 Normal Device Function?:  Yes.    Electrophysiologist's Recommendations:  Have magnet available. Provide continuous ECG monitoring when magnet is used or reprogramming is to be performed.  Procedure may interfere with device function.  Magnet should be placed over device during procedure.  Per Device Clinic Standing Orders, Delon DELENA Sharps, RN  3:54 PM 03/10/2024

## 2024-03-12 ENCOUNTER — Other Ambulatory Visit: Payer: Self-pay

## 2024-03-12 ENCOUNTER — Encounter (HOSPITAL_COMMUNITY): Payer: Self-pay

## 2024-03-12 ENCOUNTER — Encounter (HOSPITAL_COMMUNITY): Admission: RE | Admit: 2024-03-12 | Source: Ambulatory Visit

## 2024-03-12 DIAGNOSIS — R161 Splenomegaly, not elsewhere classified: Secondary | ICD-10-CM

## 2024-03-12 DIAGNOSIS — R948 Abnormal results of function studies of other organs and systems: Secondary | ICD-10-CM

## 2024-03-12 DIAGNOSIS — R945 Abnormal results of liver function studies: Secondary | ICD-10-CM

## 2024-03-12 HISTORY — DX: Malignant (primary) neoplasm, unspecified: C80.1

## 2024-03-12 HISTORY — DX: Anemia, unspecified: D64.9

## 2024-03-12 HISTORY — DX: Lichen sclerosus et atrophicus: L90.0

## 2024-03-12 HISTORY — DX: Anxiety disorder, unspecified: F41.9

## 2024-03-12 HISTORY — DX: Postnasal drip: R09.82

## 2024-03-12 HISTORY — DX: Personal history of other diseases of the digestive system: Z87.19

## 2024-03-12 HISTORY — DX: Pneumonia, unspecified organism: J18.9

## 2024-03-12 HISTORY — DX: Psoriasis, unspecified: L40.9

## 2024-03-12 LAB — COMPREHENSIVE METABOLIC PANEL WITH GFR
ALT: 18 U/L (ref 0–44)
AST: 22 U/L (ref 15–41)
Albumin: 4.1 g/dL (ref 3.5–5.0)
Alkaline Phosphatase: 115 U/L (ref 38–126)
Anion gap: 12 (ref 5–15)
BUN: 26 mg/dL — ABNORMAL HIGH (ref 8–23)
CO2: 24 mmol/L (ref 22–32)
Calcium: 9.5 mg/dL (ref 8.9–10.3)
Chloride: 102 mmol/L (ref 98–111)
Creatinine, Ser: 1.11 mg/dL — ABNORMAL HIGH (ref 0.44–1.00)
GFR, Estimated: 50 mL/min — ABNORMAL LOW
Glucose, Bld: 90 mg/dL (ref 70–99)
Potassium: 4.1 mmol/L (ref 3.5–5.1)
Sodium: 138 mmol/L (ref 135–145)
Total Bilirubin: 1.7 mg/dL — ABNORMAL HIGH (ref 0.0–1.2)
Total Protein: 6.5 g/dL (ref 6.5–8.1)

## 2024-03-12 LAB — CBC WITH DIFFERENTIAL/PLATELET
Abs Immature Granulocytes: 0.01 10*3/uL (ref 0.00–0.07)
Basophils Absolute: 0.1 10*3/uL (ref 0.0–0.1)
Basophils Relative: 1 %
Eosinophils Absolute: 0.3 10*3/uL (ref 0.0–0.5)
Eosinophils Relative: 4 %
HCT: 36.7 % (ref 36.0–46.0)
Hemoglobin: 12.4 g/dL (ref 12.0–15.0)
Immature Granulocytes: 0 %
Lymphocytes Relative: 20 %
Lymphs Abs: 1.5 10*3/uL (ref 0.7–4.0)
MCH: 32.5 pg (ref 26.0–34.0)
MCHC: 33.8 g/dL (ref 30.0–36.0)
MCV: 96.3 fL (ref 80.0–100.0)
Monocytes Absolute: 0.6 10*3/uL (ref 0.1–1.0)
Monocytes Relative: 7 %
Neutro Abs: 5.2 10*3/uL (ref 1.7–7.7)
Neutrophils Relative %: 68 %
Platelets: 202 10*3/uL (ref 150–400)
RBC: 3.81 MIL/uL — ABNORMAL LOW (ref 3.87–5.11)
RDW: 13.3 % (ref 11.5–15.5)
WBC: 7.5 10*3/uL (ref 4.0–10.5)
nRBC: 0 % (ref 0.0–0.2)

## 2024-03-12 LAB — PROTIME-INR
INR: 1.1 (ref 0.8–1.2)
Prothrombin Time: 14.3 s (ref 11.4–15.2)

## 2024-03-19 ENCOUNTER — Ambulatory Visit (HOSPITAL_COMMUNITY): Admission: RE | Admit: 2024-03-19 | Source: Home / Self Care | Admitting: General Surgery

## 2024-03-19 ENCOUNTER — Encounter (HOSPITAL_COMMUNITY): Admission: RE | Payer: Self-pay | Source: Home / Self Care

## 2024-03-19 SURGERY — LAPAROSCOPIC CHOLECYSTECTOMY
Anesthesia: General

## 2024-04-01 ENCOUNTER — Inpatient Hospital Stay: Admitting: Hematology and Oncology

## 2024-04-01 ENCOUNTER — Inpatient Hospital Stay: Attending: Hematology and Oncology

## 2024-04-06 ENCOUNTER — Other Ambulatory Visit (HOSPITAL_COMMUNITY)

## 2024-04-12 ENCOUNTER — Inpatient Hospital Stay: Admitting: Hematology and Oncology

## 2024-04-12 ENCOUNTER — Inpatient Hospital Stay: Attending: Hematology and Oncology

## 2024-04-27 ENCOUNTER — Ambulatory Visit: Admitting: Cardiology

## 2024-04-30 ENCOUNTER — Encounter

## 2024-07-19 ENCOUNTER — Ambulatory Visit: Admitting: Family Medicine

## 2024-07-30 ENCOUNTER — Encounter

## 2024-08-23 ENCOUNTER — Ambulatory Visit

## 2024-10-29 ENCOUNTER — Encounter
